# Patient Record
Sex: Female | Born: 1940
Health system: Southern US, Community
[De-identification: ages and names within clinical notes are randomized; demographics above are authoritative.]

## PROBLEM LIST (undated history)

## (undated) DIAGNOSIS — C4490 Unspecified malignant neoplasm of skin, unspecified: Secondary | ICD-10-CM

## (undated) DIAGNOSIS — M8580 Other specified disorders of bone density and structure, unspecified site: Secondary | ICD-10-CM

## (undated) DIAGNOSIS — E785 Hyperlipidemia, unspecified: Secondary | ICD-10-CM

## (undated) DIAGNOSIS — W540XXA Bitten by dog, initial encounter: Secondary | ICD-10-CM

## (undated) DIAGNOSIS — K219 Gastro-esophageal reflux disease without esophagitis: Secondary | ICD-10-CM

## (undated) DIAGNOSIS — J302 Other seasonal allergic rhinitis: Secondary | ICD-10-CM

## (undated) DIAGNOSIS — M1990 Unspecified osteoarthritis, unspecified site: Secondary | ICD-10-CM

## (undated) DIAGNOSIS — S71159A Open bite, unspecified thigh, initial encounter: Secondary | ICD-10-CM

## (undated) DIAGNOSIS — I10 Essential (primary) hypertension: Secondary | ICD-10-CM

## (undated) DIAGNOSIS — N8110 Cystocele, unspecified: Secondary | ICD-10-CM

## (undated) DIAGNOSIS — E039 Hypothyroidism, unspecified: Secondary | ICD-10-CM

## (undated) HISTORY — DX: Other specified disorders of bone density and structure, unspecified site: M85.80

## (undated) HISTORY — DX: Open bite, unspecified thigh, initial encounter: S71.159A

## (undated) HISTORY — DX: Hypothyroidism, unspecified: E03.9

## (undated) HISTORY — DX: Bitten by dog, initial encounter: W54.0XXA

## (undated) HISTORY — DX: Unspecified malignant neoplasm of skin, unspecified: C44.90

## (undated) HISTORY — DX: Cystocele, unspecified: N81.10

## (undated) HISTORY — DX: Other seasonal allergic rhinitis: J30.2

## (undated) HISTORY — DX: Unspecified osteoarthritis, unspecified site: M19.90

## (undated) HISTORY — DX: Gastro-esophageal reflux disease without esophagitis: K21.9

## (undated) HISTORY — PX: THYROIDECTOMY: SHX17

## (undated) HISTORY — DX: Hyperlipidemia, unspecified: E78.5

---

## 1997-10-24 ENCOUNTER — Other Ambulatory Visit: Admission: RE | Admit: 1997-10-24 | Discharge: 1997-10-24 | Payer: Self-pay | Admitting: *Deleted

## 1999-05-08 ENCOUNTER — Emergency Department (HOSPITAL_COMMUNITY): Admission: EM | Admit: 1999-05-08 | Discharge: 1999-05-08 | Payer: Self-pay | Admitting: Emergency Medicine

## 1999-05-08 ENCOUNTER — Encounter: Payer: Self-pay | Admitting: Emergency Medicine

## 1999-10-08 ENCOUNTER — Emergency Department (HOSPITAL_COMMUNITY): Admission: EM | Admit: 1999-10-08 | Discharge: 1999-10-09 | Payer: Self-pay | Admitting: *Deleted

## 1999-10-11 ENCOUNTER — Encounter: Payer: Self-pay | Admitting: Emergency Medicine

## 1999-10-11 ENCOUNTER — Emergency Department (HOSPITAL_COMMUNITY): Admission: EM | Admit: 1999-10-11 | Discharge: 1999-10-11 | Payer: Self-pay | Admitting: Emergency Medicine

## 1999-11-16 ENCOUNTER — Other Ambulatory Visit: Admission: RE | Admit: 1999-11-16 | Discharge: 1999-11-16 | Payer: Self-pay | Admitting: *Deleted

## 2000-06-23 ENCOUNTER — Emergency Department (HOSPITAL_COMMUNITY): Admission: EM | Admit: 2000-06-23 | Discharge: 2000-06-23 | Payer: Self-pay

## 2000-06-30 ENCOUNTER — Encounter: Admission: RE | Admit: 2000-06-30 | Discharge: 2000-09-28 | Payer: Self-pay | Admitting: Orthopedic Surgery

## 2001-05-18 ENCOUNTER — Other Ambulatory Visit: Admission: RE | Admit: 2001-05-18 | Discharge: 2001-05-18 | Payer: Self-pay | Admitting: *Deleted

## 2001-12-31 ENCOUNTER — Encounter: Payer: Self-pay | Admitting: Emergency Medicine

## 2001-12-31 ENCOUNTER — Emergency Department (HOSPITAL_COMMUNITY): Admission: EM | Admit: 2001-12-31 | Discharge: 2001-12-31 | Payer: Self-pay | Admitting: Emergency Medicine

## 2002-05-30 ENCOUNTER — Other Ambulatory Visit: Admission: RE | Admit: 2002-05-30 | Discharge: 2002-05-30 | Payer: Self-pay | Admitting: *Deleted

## 2003-02-25 ENCOUNTER — Encounter: Payer: Self-pay | Admitting: Internal Medicine

## 2003-06-23 ENCOUNTER — Other Ambulatory Visit: Admission: RE | Admit: 2003-06-23 | Discharge: 2003-06-23 | Payer: Self-pay | Admitting: *Deleted

## 2004-06-27 HISTORY — PX: COLONOSCOPY: SHX174

## 2004-06-27 LAB — HM COLONOSCOPY: HM Colonoscopy: NORMAL

## 2004-07-20 ENCOUNTER — Other Ambulatory Visit: Admission: RE | Admit: 2004-07-20 | Discharge: 2004-07-20 | Payer: Self-pay | Admitting: Obstetrics and Gynecology

## 2004-09-10 ENCOUNTER — Emergency Department (HOSPITAL_COMMUNITY): Admission: EM | Admit: 2004-09-10 | Discharge: 2004-09-10 | Payer: Self-pay | Admitting: Emergency Medicine

## 2005-09-12 ENCOUNTER — Other Ambulatory Visit: Admission: RE | Admit: 2005-09-12 | Discharge: 2005-09-12 | Payer: Self-pay | Admitting: Obstetrics and Gynecology

## 2006-09-27 ENCOUNTER — Ambulatory Visit: Payer: Self-pay | Admitting: Internal Medicine

## 2006-12-27 ENCOUNTER — Ambulatory Visit: Payer: Self-pay | Admitting: Internal Medicine

## 2006-12-27 LAB — CONVERTED CEMR LAB
ALT: 17 units/L (ref 0–35)
AST: 23 units/L (ref 0–37)
Albumin: 4.1 g/dL (ref 3.5–5.2)
Alkaline Phosphatase: 39 units/L (ref 39–117)
BUN: 10 mg/dL (ref 6–23)
Basophils Absolute: 0 10*3/uL (ref 0.0–0.1)
Basophils Relative: 0.3 % (ref 0.0–1.0)
Bilirubin, Direct: 0.2 mg/dL (ref 0.0–0.3)
CO2: 29 meq/L (ref 19–32)
Calcium: 9.4 mg/dL (ref 8.4–10.5)
Chloride: 107 meq/L (ref 96–112)
Cholesterol: 207 mg/dL (ref 0–200)
Creatinine, Ser: 0.8 mg/dL (ref 0.4–1.2)
Direct LDL: 134.1 mg/dL
Eosinophils Absolute: 0.2 10*3/uL (ref 0.0–0.6)
Eosinophils Relative: 4.8 % (ref 0.0–5.0)
GFR calc Af Amer: 93 mL/min
GFR calc non Af Amer: 77 mL/min
Glucose, Bld: 89 mg/dL (ref 70–99)
HCT: 41.2 % (ref 36.0–46.0)
HDL: 58.7 mg/dL (ref >39.0)
Hemoglobin: 14 g/dL (ref 12.0–15.0)
Lymphocytes Relative: 45.6 % (ref 12.0–46.0)
MCHC: 34 g/dL (ref 30.0–36.0)
MCV: 94.5 fL (ref 78.0–100.0)
Monocytes Absolute: 0.4 10*3/uL (ref 0.2–0.7)
Monocytes Relative: 9.3 % (ref 3.0–11.0)
Neutro Abs: 1.8 10*3/uL (ref 1.4–7.7)
Neutrophils Relative %: 40 % — ABNORMAL LOW (ref 43.0–77.0)
Platelets: 273 10*3/uL (ref 150–400)
Potassium: 3.6 meq/L (ref 3.5–5.1)
RBC: 4.36 M/uL (ref 3.87–5.11)
RDW: 12.2 % (ref 11.5–14.6)
Sodium: 142 meq/L (ref 135–145)
TSH: 1.97 microintl units/mL (ref 0.35–5.50)
Total Bilirubin: 2.2 mg/dL — ABNORMAL HIGH (ref 0.3–1.2)
Total CHOL/HDL Ratio: 3.5
Total Protein: 6.8 g/dL (ref 6.0–8.3)
Triglycerides: 68 mg/dL (ref 0–149)
VLDL: 14 mg/dL (ref 0–40)
Vit D, 1,25-Dihydroxy: 33 (ref 20–57)
WBC: 4.4 10*3/uL — ABNORMAL LOW (ref 4.5–10.5)

## 2007-01-25 DIAGNOSIS — E039 Hypothyroidism, unspecified: Secondary | ICD-10-CM

## 2007-01-25 DIAGNOSIS — M899 Disorder of bone, unspecified: Secondary | ICD-10-CM | POA: Insufficient documentation

## 2007-01-25 DIAGNOSIS — M949 Disorder of cartilage, unspecified: Secondary | ICD-10-CM

## 2007-01-25 DIAGNOSIS — M810 Age-related osteoporosis without current pathological fracture: Secondary | ICD-10-CM | POA: Insufficient documentation

## 2007-01-30 ENCOUNTER — Telehealth: Payer: Self-pay | Admitting: Internal Medicine

## 2007-03-06 ENCOUNTER — Ambulatory Visit: Payer: Self-pay | Admitting: Internal Medicine

## 2007-03-09 LAB — CONVERTED CEMR LAB: Vit D, 1,25-Dihydroxy: 34 (ref 20–57)

## 2007-03-28 ENCOUNTER — Ambulatory Visit: Payer: Self-pay | Admitting: Vascular Surgery

## 2007-05-09 ENCOUNTER — Ambulatory Visit: Payer: Self-pay | Admitting: Internal Medicine

## 2007-05-10 ENCOUNTER — Encounter: Payer: Self-pay | Admitting: Internal Medicine

## 2007-05-14 LAB — CONVERTED CEMR LAB: Vit D, 1,25-Dihydroxy: 37 (ref 30–89)

## 2007-06-22 ENCOUNTER — Encounter: Payer: Self-pay | Admitting: Internal Medicine

## 2007-06-25 ENCOUNTER — Telehealth: Payer: Self-pay | Admitting: Internal Medicine

## 2007-07-29 HISTORY — PX: ABDOMINAL HYSTERECTOMY: SHX81

## 2007-07-29 HISTORY — PX: OTHER SURGICAL HISTORY: SHX169

## 2007-07-31 ENCOUNTER — Ambulatory Visit (HOSPITAL_COMMUNITY): Admission: RE | Admit: 2007-07-31 | Discharge: 2007-08-01 | Payer: Self-pay | Admitting: Obstetrics & Gynecology

## 2007-07-31 ENCOUNTER — Encounter (INDEPENDENT_AMBULATORY_CARE_PROVIDER_SITE_OTHER): Payer: Self-pay | Admitting: Obstetrics & Gynecology

## 2007-10-09 ENCOUNTER — Emergency Department (HOSPITAL_COMMUNITY): Admission: EM | Admit: 2007-10-09 | Discharge: 2007-10-09 | Payer: Self-pay | Admitting: Family Medicine

## 2007-10-30 ENCOUNTER — Emergency Department (HOSPITAL_COMMUNITY): Admission: EM | Admit: 2007-10-30 | Discharge: 2007-10-30 | Payer: Self-pay | Admitting: Family Medicine

## 2008-03-31 ENCOUNTER — Ambulatory Visit: Payer: Self-pay | Admitting: Internal Medicine

## 2008-03-31 DIAGNOSIS — M79609 Pain in unspecified limb: Secondary | ICD-10-CM | POA: Insufficient documentation

## 2008-03-31 LAB — CONVERTED CEMR LAB
Bilirubin Urine: NEGATIVE
Glucose, Urine, Semiquant: NEGATIVE
Specific Gravity, Urine: 1.02
pH: 5.5

## 2008-04-07 LAB — CONVERTED CEMR LAB
ALT: 16 units/L (ref 0–35)
Albumin: 3.9 g/dL (ref 3.5–5.2)
BUN: 13 mg/dL (ref 6–23)
Basophils Absolute: 0 10*3/uL (ref 0.0–0.1)
Basophils Relative: 0.5 % (ref 0.0–3.0)
CO2: 30 meq/L (ref 19–32)
Calcium: 9.1 mg/dL (ref 8.4–10.5)
Cholesterol: 193 mg/dL (ref 0–200)
Creatinine, Ser: 0.8 mg/dL (ref 0.4–1.2)
Eosinophils Absolute: 0.2 10*3/uL (ref 0.0–0.7)
Eosinophils Relative: 6 % — ABNORMAL HIGH (ref 0.0–5.0)
Hemoglobin: 13.5 g/dL (ref 12.0–15.0)
MCHC: 34.7 g/dL (ref 30.0–36.0)
MCV: 95.1 fL (ref 78.0–100.0)
Neutro Abs: 1.6 10*3/uL (ref 1.4–7.7)
RBC: 4.08 M/uL (ref 3.87–5.11)
TSH: 2.1 microintl units/mL (ref 0.35–5.50)
Total Bilirubin: 1.3 mg/dL — ABNORMAL HIGH (ref 0.3–1.2)

## 2008-05-13 ENCOUNTER — Encounter: Payer: Self-pay | Admitting: Internal Medicine

## 2008-05-19 ENCOUNTER — Encounter: Payer: Self-pay | Admitting: Internal Medicine

## 2008-05-23 ENCOUNTER — Encounter: Payer: Self-pay | Admitting: Internal Medicine

## 2008-05-27 ENCOUNTER — Encounter: Payer: Self-pay | Admitting: Internal Medicine

## 2008-06-03 ENCOUNTER — Encounter: Payer: Self-pay | Admitting: Internal Medicine

## 2008-06-03 ENCOUNTER — Telehealth: Payer: Self-pay | Admitting: *Deleted

## 2008-06-05 ENCOUNTER — Encounter: Payer: Self-pay | Admitting: Internal Medicine

## 2008-06-09 ENCOUNTER — Telehealth: Payer: Self-pay | Admitting: *Deleted

## 2008-06-18 ENCOUNTER — Ambulatory Visit: Payer: Self-pay | Admitting: Internal Medicine

## 2008-06-25 ENCOUNTER — Telehealth: Payer: Self-pay | Admitting: Internal Medicine

## 2008-07-03 ENCOUNTER — Ambulatory Visit: Payer: Self-pay | Admitting: Internal Medicine

## 2008-07-03 LAB — CONVERTED CEMR LAB: Thyroglobulin Ab: <30 (ref 0.0–60.0)

## 2008-07-14 ENCOUNTER — Encounter: Payer: Self-pay | Admitting: Internal Medicine

## 2008-07-14 LAB — CONVERTED CEMR LAB: Free T4: 1.2 ng/dL (ref 0.6–1.6)

## 2008-12-11 ENCOUNTER — Telehealth: Payer: Self-pay | Admitting: *Deleted

## 2008-12-11 ENCOUNTER — Ambulatory Visit: Payer: Self-pay | Admitting: Internal Medicine

## 2009-01-15 ENCOUNTER — Ambulatory Visit: Payer: Self-pay | Admitting: Internal Medicine

## 2009-04-27 LAB — HM MAMMOGRAPHY: HM Mammogram: NORMAL

## 2009-06-04 ENCOUNTER — Encounter (INDEPENDENT_AMBULATORY_CARE_PROVIDER_SITE_OTHER): Payer: Self-pay | Admitting: *Deleted

## 2009-10-13 ENCOUNTER — Ambulatory Visit: Payer: Self-pay | Admitting: Internal Medicine

## 2009-10-13 DIAGNOSIS — R350 Frequency of micturition: Secondary | ICD-10-CM | POA: Insufficient documentation

## 2009-10-13 DIAGNOSIS — R12 Heartburn: Secondary | ICD-10-CM

## 2009-10-13 DIAGNOSIS — M545 Low back pain: Secondary | ICD-10-CM

## 2009-10-13 LAB — CONVERTED CEMR LAB
Bilirubin Urine: NEGATIVE
Blood in Urine, dipstick: NEGATIVE
Ketones, urine, test strip: NEGATIVE
Protein, U semiquant: NEGATIVE
Urobilinogen, UA: 0.2
pH: 5

## 2009-10-19 LAB — CONVERTED CEMR LAB
Albumin: 4.1 g/dL (ref 3.5–5.2)
Alkaline Phosphatase: 40 units/L (ref 39–117)
Basophils Absolute: 0 10*3/uL (ref 0.0–0.1)
Basophils Relative: 0.3 % (ref 0.0–3.0)
CO2: 27 meq/L (ref 19–32)
Chloride: 110 meq/L (ref 96–112)
Eosinophils Absolute: 0.2 10*3/uL (ref 0.0–0.7)
Glucose, Bld: 90 mg/dL (ref 70–99)
HCT: 38.4 % (ref 36.0–46.0)
HDL: 59.7 mg/dL (ref >39.00)
Hemoglobin: 13.3 g/dL (ref 12.0–15.0)
Lymphs Abs: 1.8 10*3/uL (ref 0.7–4.0)
MCHC: 34.7 g/dL (ref 30.0–36.0)
MCV: 94.5 fL (ref 78.0–100.0)
Neutro Abs: 2 10*3/uL (ref 1.4–7.7)
Potassium: 4.2 meq/L (ref 3.5–5.1)
RBC: 4.06 M/uL (ref 3.87–5.11)
RDW: 13.1 % (ref 11.5–14.6)
Sodium: 144 meq/L (ref 135–145)
TSH: 0.38 microintl units/mL (ref 0.35–5.50)
Total CHOL/HDL Ratio: 3
Total Protein: 7 g/dL (ref 6.0–8.3)
Triglycerides: 61 mg/dL (ref 0.0–149.0)

## 2010-01-28 ENCOUNTER — Emergency Department (HOSPITAL_COMMUNITY)
Admission: EM | Admit: 2010-01-28 | Discharge: 2010-01-29 | Payer: Self-pay | Source: Home / Self Care | Admitting: Emergency Medicine

## 2010-01-31 ENCOUNTER — Emergency Department (HOSPITAL_COMMUNITY): Admission: EM | Admit: 2010-01-31 | Discharge: 2010-01-31 | Payer: Self-pay | Admitting: Family Medicine

## 2010-02-02 ENCOUNTER — Encounter: Payer: Self-pay | Admitting: Internal Medicine

## 2010-02-04 ENCOUNTER — Emergency Department (HOSPITAL_COMMUNITY): Admission: EM | Admit: 2010-02-04 | Discharge: 2010-02-04 | Payer: Self-pay | Admitting: Family Medicine

## 2010-02-10 ENCOUNTER — Ambulatory Visit: Payer: Self-pay | Admitting: Internal Medicine

## 2010-02-10 DIAGNOSIS — J069 Acute upper respiratory infection, unspecified: Secondary | ICD-10-CM | POA: Insufficient documentation

## 2010-02-11 ENCOUNTER — Emergency Department (HOSPITAL_COMMUNITY): Admission: EM | Admit: 2010-02-11 | Discharge: 2010-02-11 | Payer: Self-pay | Admitting: Emergency Medicine

## 2010-03-19 ENCOUNTER — Ambulatory Visit: Payer: Self-pay | Admitting: Internal Medicine

## 2010-03-31 ENCOUNTER — Ambulatory Visit: Payer: Self-pay | Admitting: Internal Medicine

## 2010-03-31 DIAGNOSIS — R002 Palpitations: Secondary | ICD-10-CM | POA: Insufficient documentation

## 2010-04-05 LAB — CONVERTED CEMR LAB
Basophils Relative: 0.2 % (ref 0.0–3.0)
HCT: 38.1 % (ref 36.0–46.0)
Hemoglobin: 13.2 g/dL (ref 12.0–15.0)
Lymphocytes Relative: 38.5 % (ref 12.0–46.0)
Lymphs Abs: 2.2 10*3/uL (ref 0.7–4.0)
MCHC: 34.6 g/dL (ref 30.0–36.0)
Monocytes Relative: 8.3 % (ref 3.0–12.0)
Neutro Abs: 2.7 10*3/uL (ref 1.4–7.7)
RBC: 3.97 M/uL (ref 3.87–5.11)
RDW: 13.7 % (ref 11.5–14.6)
TSH: 0.5 microintl units/mL (ref 0.35–5.50)

## 2010-04-16 ENCOUNTER — Encounter: Payer: Self-pay | Admitting: Internal Medicine

## 2010-05-26 ENCOUNTER — Encounter: Payer: Self-pay | Admitting: Internal Medicine

## 2010-07-02 ENCOUNTER — Encounter: Payer: Self-pay | Admitting: Internal Medicine

## 2010-07-29 ENCOUNTER — Encounter: Payer: Self-pay | Admitting: Cardiovascular Disease

## 2010-07-29 ENCOUNTER — Ambulatory Visit (INDEPENDENT_AMBULATORY_CARE_PROVIDER_SITE_OTHER): Payer: Self-pay

## 2010-07-29 ENCOUNTER — Encounter (INDEPENDENT_AMBULATORY_CARE_PROVIDER_SITE_OTHER): Payer: Self-pay | Admitting: *Deleted

## 2010-07-29 DIAGNOSIS — Z0000 Encounter for general adult medical examination without abnormal findings: Secondary | ICD-10-CM

## 2010-07-29 NOTE — Letter (Signed)
Summary: Rheumatology-Dr. Stacey Drain  Rheumatology-Dr. Stacey Drain   Imported By: Maryln Gottron 04/26/2010 10:53:25  _____________________________________________________________________  External Attachment:    Type:   Image     Comment:   External Document

## 2010-07-29 NOTE — Assessment & Plan Note (Signed)
Summary: emp/pt fasting/cjr pt rsc due to weather/njr   Vital Signs:  Patient profile:   70 year old female Menstrual status:  hysterectomy Height:      61 inches Weight:      156 pounds BMI:     29.58 Pulse rate:   60 / minute BP sitting:   120 / 80  (right arm) Cuff size:   regular  Vitals Entered By: Romualdo Bolk, CMA (AAMA) (October 13, 2009 9:32 AM) CC: Annual visit for disease management- Pt is fasting for labs     Menstrual Status hysterectomy   History of Present Illness: [name] [name] comesin comes in today  for  medical management   Also Here for Medicare AWV:   1.   Risk factors based on Past M, S, F history:  bone health   at risk    no family hx of fracture 2.   Physical Activities:   less recently caring for husband  who had serious mva last year  No limitations except time  3.   Depression/mood:  No major concerns  jsut busy . No dementia signs  4.   Hearing:  no concerns    no eye disease  had hearing checked  5.   ADL's:  does hers works and more  6.   Fall Risk:  no falls  disc prevention 7.   Home Safety:   Yes .   8.   Height, weight, &visual acuity: 9.   Counseling:  yes   nnutrion accident avoidance  . etc.  10.   Labs ordered based on risk factors:    thyroid cbc lipids and liver  bmp  11.           Referral Coordination  no referrals needed  also has gyne  12.           Care Plan  get zostavax when able   thyroid : no change in meds and doing well  Osteoprosis :   dexa     per gyne ?  on vit d  and calcium     Preventive Care Screening  Mammogram:    Date:  04/27/2009    Results:  normal   Colonoscopy:    Date:  06/27/2004    Results:  normal   Prior Values:    Last Tetanus Booster:  Td (12/27/2006)    Last Flu Shot:  Fluvax 3+ (03/31/2008)    Last Pneumovax:  Pneumovax (Medicare) (01/15/2009)   Preventive Screening-Counseling & Management  Alcohol-Tobacco     Alcohol drinks/day: <1     Alcohol type: wine     Smoking Status:  never  Caffeine-Diet-Exercise     Caffeine use/day: 0     Does Patient Exercise: yes  Comments: denies depression   Hep-HIV-STD-Contraception     Dental Visit-last 6 months yes     Sun Exposure-Excessive: no  Safety-Violence-Falls     Seat Belt Use: yes     Firearms in the Home: firearms in the home     Firearm Counseling: to practice firearm safety     Smoke Detectors: yes     Violence in the Home: no risk noted     Fall Risk: no      Fall Risk Counseling: not indicated; no significant falls noted  Comments: ocass     tanning bed       Blood Transfusions:  no.    EKG  Procedure date:  12/27/2006  Findings:  Sinus bradycardia with rate of:  58  Current Medications (verified): 1)  Actonel 150 Mg Tabs (Risedronate Sodium) .Marland Kitchen.. 1 Monthly 2)  Caltrate 600 1500 Mg Tabs (Calcium Carbonate) 3)  Combipatch 0.05-0.14 Mg/day Pttw (Estradiol-Norethindrone Acet) .... Apply 1 Patch To Skin Twice A Week 4)  Longs B Complex - C  Tabs (B Complex-C) .... Take 5)  Synthroid 100 Mcg Tabs (Levothyroxine Sodium) .... Take 1 Tablet By Mouth Once A Day 6)  Vitamin D 1000 Unit  Tabs (Cholecalciferol)  Allergies (verified): No Known Drug Allergies  Past History:  Past medical, surgical, family and social histories (including risk factors) reviewed, and no changes noted (except as noted below).  Past Medical History: G1P1 Hypothyroidism post thyroidectomy  Osteopenia/ Osteoporosis T  -2.5 hip in 2005 by dexa  Seasonal Rhinitis Osteoporosis Skin cancer   squamous cell ca chest .  Elevated lipids Vaginal prolapse hx corrected  CONSULTANTS  Ezzie Dural  derm         Past Surgical History: Reviewed history from 06/18/2008 and no changes required. Thyroidectomy Colonoscopy-2006 Hysterectomy 07/2007 Bladders sling surgery 07/2007     Past History:  Care Management: Gastroenterology: Deboraha Sprang GI Gynecology: Randa Lynn  Dermatology: Michaell Cowing  Family History: Reviewed  history from 03/31/2008 and no changes required. Family History of Alcoholism/Addiction Family History Kidney disease Family History Other cancer-Kidney Family History of Cardiovascular disorder Mom died MI   in her 80s . heavy  alcohol Brother  died  of enlarged heart.   in his 32s      no fam hx of hip fracture   Social History: Reviewed history from 06/18/2008 and no changes required. 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      Caffeine use/day:  0 Seat Belt Use:  yes Dental Care w/in 6 mos.:  yes Sun Exposure-Excessive:  no Fall Risk:  no  Blood Transfusions:  no  Review of Systems  The patient denies anorexia, fever, weight loss, weight gain, vision loss, hoarseness, chest pain, syncope, dyspnea on exertion, peripheral edema, prolonged cough, headaches, hemoptysis, abdominal pain, melena, hematochezia, severe indigestion/heartburn, muscle weakness, transient blindness, difficulty walking, depression, unusual weight change, abnormal bleeding, enlarged lymph nodes, angioedema, and breast masses.         indigestion esp in am   does eat late at night .     lots of green tee and coffe  no exercise induced symptom or pulm symptom with this.  Indigestion ocass usallin in am  eats late  ocass otc med no dysphagia cp sob or exercise related problem .   Physical Exam General Appearance: well developed, well nourished, no acute distress Eyes: conjunctiva and lids normal, PERRLA, EOMI, WNL  glasses  Ears, Nose, Mouth, Throat: TM clear, nares clear, oral exam WNL Neck: supple, no lymphadenopathy, no thyromegaly, no JVD Respiratory: clear to auscultation and percussion, respiratory effort normal Cardiovascular: regular rate and rhythm, S1-S2, no murmur, rub or gallop, no bruits, peripheral pulses normal and symmetric, no cyanosis, clubbing, edema or  varicosities Chest: no scars, masses, tenderness; no asymmetry, skin changes, nipple discharge    some kyphosis  Gastrointestinal: soft, non-tender; no hepatosplenomegaly, masses; active bowel sounds all quadrants,  Genitourinary: per gyne Lymphatic: no cervical, axillary or inguinal adenopathy Musculoskeletal: gait normal, muscle tone and strength WNL,  no joint swelling, effusions, discoloration, crepitus  Skin: clear, good turgor, color WNL, no rashes, lesions, or ulcerations Neurologic: normal mental status, normal reflexes, normal strength, sensation, and motion Psychiatric: alert; oriented to person, place and time Other Exam:     Impression & Recommendations:  Problem # 1:  PREVENTIVE HEALTH CARE (ICD-V70.0)  Wellness factorsadressed .  See  HPI    Discussed nutrition,exercise,diet,healthy weight, vitamin D and calcium.  injury prevention.  Orders: First annual wellness visit with prevention plan  (E4540) TLB-BMP (Basic Metabolic Panel-BMET) (80048-METABOL) TLB-CBC Platelet - w/Differential (85025-CBCD) TLB-Hepatic/Liver Function Pnl (80076-HEPATIC) TLB-Lipid Panel (80061-LIPID)  Problem # 2:  HYPOTHYROIDISM (ICD-244.9)  Her updated medication list for this problem includes:    Synthroid 100 Mcg Tabs (Levothyroxine sodium) .Marland Kitchen... Take 1 tablet by mouth once a day  Orders: TLB-TSH (Thyroid Stimulating Hormone) [phone]) Prescription Created Electronically [phone])  Problem # 3:  OSTEOPOROSIS (ICD-733.00) on meds and most recent exa reportedly  osteopenia  but orig dx by dexa in stoeporosi category ..  thyroid disease is a  risk also but no sig fam hx  Her updated medication list for this problem includes:    Actonel 150 Mg Tabs (Risedronate sodium) .Marland Kitchen... 1 monthly    Caltrate 600 1500 Mg Tabs (Calcium carbonate)    Vitamin D 1000 Unit Tabs (Cholecalciferol)  Problem # 4:  HEARTBURN (ICD-787.1) Assessment: New disc      avoid eating late at night  no alarm signs except  age  .. disc   and canuse otc zantaac or pepcid . if using meds frequently of persistent or  progressive  then needs ov to discuss an poss further eval.   care with using  actonel as this could  cause  Upper gi inflammation.   Problem # 5:  FREQUENCY, URINARY (ICD-788.41) nl  ua  could be from  increase use of green tea.  Orders: TLB-BMP (Basic Metabolic Panel-BMET) (80048-METABOL) TLB-CBC Platelet - w/Differential (85025-CBCD) TLB-Hepatic/Liver Function Pnl (80076-HEPATIC)  Complete Medication List: 1)  Actonel 150 Mg Tabs (Risedronate sodium) .Marland Kitchen.. 1 monthly 2)  Caltrate 600 1500 Mg Tabs (Calcium carbonate) 3)  Combipatch 0.05-0.14 Mg/day Pttw (Estradiol-norethindrone acet) .... Apply 1 patch to skin twice a week 4)  Longs B Complex - C Tabs (B complex-c) .... Take 5)  Synthroid 100 Mcg Tabs (Levothyroxine sodium) .... Take 1 tablet by mouth once a day 6)  Vitamin D 1000 Unit Tabs (Cholecalciferol)  Other Orders: UA Dipstick w/o Micro (automated)  (81003)  Patient Instructions: 1)  You will be informed of lab results when available.  2)  call for zostavax when needed.  Prescriptions: SYNTHROID 100 MCG TABS (LEVOTHYROXINE SODIUM) Take 1 tablet by mouth once a day  #30 Tablet x 11   Entered by:   Romualdo Bolk, CMA (AAMA)   Authorized by:   Madelin Headings MD   Signed by:   Romualdo Bolk, CMA (AAMA) on 10/13/2009   Method used:   Electronically to        [facility] [address] [phone]* (retail)       [address]       [address]       [place], [place]  [address]       Ph: [phone]       Fax: [phone]   RxID:   [id number]   Laboratory Results   Urine Tests    Routine Urinalysis   Color: yellow Appearance: Clear Glucose: negative   (Normal  Range: Negative) Bilirubin: negative   (Normal Range: Negative) Ketone: negative   (Normal Range: Negative) Spec. Gravity: >=1.030   (Normal Range: 1.003-1.035) Blood: negative   (Normal Range: Negative) pH:  5.0   (Normal Range: 5.0-8.0) Protein: negative   (Normal Range: Negative) Urobilinogen: 0.2   (Normal Range: 0-1) Nitrite: negative   (Normal Range: Negative) Leukocyte Esterace: negative   (Normal Range: Negative)        Appended Document: emp/pt fasting/cjr pt rsc due to weather/njr Neuro cognitive status evaluated: Pt was  A&Ox3,affect,speech,memory,attention,&motor skills appeared normally  intact.

## 2010-07-29 NOTE — Assessment & Plan Note (Signed)
Summary: FLU SHOT//ALP  Nurse Visit   Allergies: No Known Drug Allergies  Appended Document: FLU SHOT//ALP    Nurse Visit   Vital Signs:  Patient profile:   70 year old female Menstrual status:  hysterectomy Temp:     98.5 degrees F oral  Vitals Entered By: Duard Brady LPN (March 19, 2010 12:04 PM)  Allergies: No Known Drug Allergies  Immunizations Administered:  Influenza Vaccine # 1:    Vaccine Type: Fluvax MCR    Site: left deltoid    Mfr: GlaxoSmithKline    Dose: 0.5 ml    Route: IM    Given by: Duard Brady LPN    Exp. Date: 12/25/2010    Lot #: [id number]    VIS given: 01/19/10 version given March 19, 2010.    Physician counseled: yes  Flu Vaccine Consent Questions:    Do you have a history of severe allergic reactions to this vaccine? no    Any prior history of allergic reactions to egg and/or gelatin? no    Do you have a sensitivity to the preservative Thimersol? no    Do you have a past history of Guillan-Barre Syndrome? no    Do you currently have an acute febrile illness? no    Have you ever had a severe reaction to latex? no    Vaccine information given and explained to patient? yes    Are you currently pregnant? no  Orders Added: 1)  Influenza Vaccine MCR [00025]

## 2010-07-29 NOTE — Assessment & Plan Note (Signed)
Summary: SINUSITIS? // RS   Vital Signs:  Patient profile:   70 year old female Menstrual status:  hysterectomy Weight:      157 pounds O2 Sat:      97 % on Room air Temp:     98.4 degrees F oral Pulse rate:   87 / minute BP sitting:   110 / 80  (left arm) Cuff size:   regular  Vitals Entered By: Romualdo Bolk, CMA (AAMA) (February 10, 2010 3:01 PM)  O2 Flow:  Room air CC: Sinus pressure, coughing and congestion. Pt took Augmentin on 8/4 x 5 days. This started on 8/16. Pt is also getting rabies vaccines at [facility]. Pt is taking Ampicillin.   History of Present Illness: [name] [name] comes in today for acute evaluation for  ur .sx  Most recently has  had to have rabies vaccination  for cat  stray cat exposures  . on last  rabies  shot .  Augmentin  for 5 days august 4th  felt ok then got sore throat and congestion began 3 days ago  and then had low grade 99.1   and took asa.  without  feverSome body aches .   imporved today   Took husbands  ampicillin.    x 1     coughing up white  phelgm.   white .    hx of  seasonal rhnitis but not now.    Preventive Screening-Counseling & Management  Alcohol-Tobacco     Alcohol drinks/day: <1     Alcohol type: wine     Smoking Status: never  Caffeine-Diet-Exercise     Caffeine use/day: 0     Does Patient Exercise: yes  Current Medications (verified): 1)  Actonel 150 Mg Tabs (Risedronate Sodium) .Marland Kitchen.. 1 Monthly 2)  Caltrate 600 1500 Mg Tabs (Calcium Carbonate) 3)  Combipatch 0.05-0.14 Mg/day Pttw (Estradiol-Norethindrone Acet) .... Apply 1 Patch To Skin Twice A Week 4)  Longs B Complex - C  Tabs (B Complex-C) .... Take 5)  Synthroid 100 Mcg Tabs (Levothyroxine Sodium) .... Take 1 Tablet By Mouth Once A Day 6)  Vitamin D 1000 Unit  Tabs (Cholecalciferol) 7)  Rabavert  Susr (Rabies Vaccine, Pcec)  Allergies (verified): No Known Drug Allergies  Past History:  Past medical, surgical, family and social histories  (including risk factors) reviewed for relevance to current acute and chronic problems.  Past Medical History: G1P1 Hypothyroidism post thyroidectomy  Osteopenia/ Osteoporosis T  -2.5 hip in 2005 by dexa  Seasonal Rhinitis  spring fall Osteoporosis Skin cancer   squamous cell ca chest .  Elevated lipids Vaginal prolapse hx corrected  CONSULTANTS  Ezzie Dural  derm         Past Surgical History: Reviewed history from 06/18/2008 and no changes required. Thyroidectomy Colonoscopy-2006 Hysterectomy 07/2007 Bladders sling surgery 07/2007     Family History: Reviewed history from 10/13/2009 and no changes required. Family History of Alcoholism/Addiction Family History Kidney disease Family History Other cancer-Kidney Family History of Cardiovascular disorder Mom died MI   in her 60s . heavy  alcohol Brother  died  of enlarged heart.   in his 41s      no fam hx of hip fracture   Social History: Reviewed history from 10/13/2009 and no changes required. 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        Review of Systems  The patient denies anorexia, weight loss, chest pain, dyspnea on exertion, peripheral edema, prolonged cough, abnormal bleeding, and enlarged lymph nodes.    Physical Exam  General:  alert, well-developed, and well-nourished.   Head:  normocephalic and atraumatic.   Eyes:  vision grossly intact.   Ears:  R ear normal and L ear normal.   Nose:  no external deformity and no external erythema.  mucoid to lear dc left fore than right  no tenderness Mouth:  pharynx pink and moist.   Neck:  No deformities, masses, or tenderness noted. Lungs:  Normal respiratory effort, chest expands symmetrically. Lungs are clear to auscultation, no crackles or wheezes.no dullness.   Heart:  Normal rate and regular rhythm. S1 and S2 normal without  gallop, murmur, click, rub or other extra sounds.   Impression & Recommendations:  Problem # 1:  URI (ICD-465.9) seems viral   but hx of sinusitis rx    Expectant management and  call if persistent or  progressive  for other intervention as needed   Problem # 2:  Hx of CAT BITE AUGUST  RABIES PROPHYL (ICD-E906.3)  Complete Medication List: 1)  Actonel 150 Mg Tabs (Risedronate sodium) .Marland Kitchen.. 1 monthly 2)  Caltrate 600 1500 Mg Tabs (Calcium carbonate) 3)  Combipatch 0.05-0.14 Mg/day Pttw (Estradiol-norethindrone acet) .... Apply 1 patch to skin twice a week 4)  Longs B Complex - C Tabs (B complex-c) .... Take 5)  Synthroid 100 Mcg Tabs (Levothyroxine sodium) .... Take 1 tablet by mouth once a day 6)  Vitamin D 1000 Unit Tabs (Cholecalciferol) 7)  Rabavert Susr (Rabies vaccine, pcec)  Patient Instructions: 1)  Acute sinusitis symptoms for less than 10 days are not helped by antibiotics. Use warm moist compresses, and over the counter decongestants( only as directed). Call if no improvement in 5-7 days, sooner if increasing pain, feve over 100.3 , or new symptoms.  2)  this acts like a viral infection and should last about 10 -14 days .

## 2010-07-29 NOTE — Assessment & Plan Note (Signed)
Summary: CHECK THYROID//SLM   Vital Signs:  Patient profile:   70 year old female Menstrual status:  hysterectomy Weight:      160 pounds Pulse rate:   78 / minute BP sitting:   100 / 70  (right arm) Cuff size:   regular  Vitals Entered By: Romualdo Bolk, CMA (AAMA) (March 31, 2010 12:35 PM) CC: Pt wants her tsh checked. She is on generic synthroid. Pt states that she can hear her heart race and her heads gets wet on occ.   History of Present Illness: [name] [name] comes in today  for changes  as above . Feels like she did a bit  before her thyroid surgery with hearing heart beating but no racing usually in the pm No cp sob .   no change in activity or exercise   intolerance . jittery and damp at base of neck but no sweats  nausea or vomiting .  ? some anxiety .  She says she feels Almost like before had thyroid surgery.     Has had  this for about  3 weeks. no change in meds but has been on   generic for months   ( yellow pill  )seems to be the same . No chest pain no change in indigestion and no sob.  NO fever . NO night  minimal alcohol    " more green tea" recently   . no supplements otherwise .   Asks about shingles vaccine. To get today.   Preventive Screening-Counseling & Management  Alcohol-Tobacco     Alcohol drinks/day: <1     Alcohol type: wine     Smoking Status: never  Caffeine-Diet-Exercise     Caffeine use/day: 0     Does Patient Exercise: yes  Current Medications (verified): 1)  Actonel 150 Mg Tabs (Risedronate Sodium) .Marland Kitchen.. 1 Monthly 2)  Caltrate 600 1500 Mg Tabs (Calcium Carbonate) 3)  Combipatch 0.05-0.14 Mg/day Pttw (Estradiol-Norethindrone Acet) .... Apply 1 Patch To Skin Twice A Week 4)  Longs B Complex - C  Tabs (B Complex-C) .... Take 5)  Synthroid 100 Mcg Tabs (Levothyroxine Sodium) .... Take 1 Tablet By Mouth Once A Day 6)  Vitamin D 1000 Unit  Tabs (Cholecalciferol)  Allergies (verified): No Known Drug Allergies  Past History:  Past  medical, surgical, family and social histories (including risk factors) reviewed, and no changes noted (except as noted below).  Past Medical History: Reviewed history from 02/10/2010 and no changes required. G1P1 Hypothyroidism post thyroidectomy  Osteopenia/ Osteoporosis T  -2.5 hip in 2005 by dexa  Seasonal Rhinitis  spring fall Osteoporosis Skin cancer   squamous cell ca chest .  Elevated lipids Vaginal prolapse hx corrected  CONSULTANTS  Ezzie Dural  derm         Past Surgical History: Reviewed history from 06/18/2008 and no changes required. Thyroidectomy Colonoscopy-2006 Hysterectomy 07/2007 Bladders sling surgery 07/2007     Past History:  Care Management: Gastroenterology: Deboraha Sprang GI Gynecology: Randa Lynn  Dermatology: Michaell Cowing  Family History: Reviewed history from 10/13/2009 and no changes required. Family History of Alcoholism/Addiction Family History Kidney disease Family History Other cancer-Kidney Family History of Cardiovascular disorder Mom died MI   in her 61s . heavy  alcohol Brother  died  of enlarged heart.   in his 64s      no fam hx of hip fracture   Social History: Reviewed history from 10/13/2009 and no changes required. 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        Review of Systems  The patient denies anorexia, fever, weight loss, weight gain, vision loss, decreased hearing, chest pain, syncope, dyspnea on exertion, peripheral edema, prolonged cough, transient blindness, difficulty walking, depression, abnormal bleeding, enlarged lymph nodes, and angioedema.    Physical Exam  General:  Well-developed,well-nourished,in no acute distress; alert,appropriate and cooperative throughout examination Head:  normocephalic and atraumatic.   Eyes:  vision grossly intact.   Ears:  R ear normal and L ear normal.    Nose:  no external deformity, no external erythema, and no nasal discharge.   Neck:  No deformities, masses, or tenderness noted. Lungs:  Normal respiratory effort, chest expands symmetrically. Lungs are clear to auscultation, no crackles or wheezes. Heart:  Normal rate and regular rhythm. S1 and S2 normal without gallop, murmur, click, rub or other extra sounds.no lifts.   Abdomen:  Bowel sounds positive,abdomen soft and non-tender without masses, organomegaly or   noted. Pulses:  pulses intact without delay  not bounding  Extremities:  no clubbing cyanosis or edema  Neurologic:  alert & oriented X3.  grossly non focal  Skin:  turgor normal, color normal, no ecchymoses, and no petechiae.   Cervical Nodes:  No lymphadenopathy noted Psych:  Oriented X3, good eye contact, not anxious appearing, and not depressed appearing.     Impression & Recommendations:  Problem # 1:  PALPITATIONS (ICD-785.1) somewhat vague signs like thyperthyroid but nl exam and  no alarm features.  Orders: TLB-TSH (Thyroid Stimulating Hormone) (84443-TSH) TLB-CBC Platelet - w/Differential (85025-CBCD) T-T4, Thyroxine; Total [phone]) T-T3 by RIA ([id number]) Venipuncture (28413) Specimen Handling (24401)  Problem # 2:  PREVENTIVE HEALTH CARE (ICD-V70.0) agree with      shingles shot today.   Problem # 3:  HYPOTHYROIDISM (ICD-244.9)  replacement with change from brand to gneric over 3   months  then recheck.  Her updated medication list for this problem includes:    Synthroid 100 Mcg Tabs (Levothyroxine sodium) .Marland Kitchen... Take 1 tablet by mouth once a day  Orders: Venipuncture (02725) Specimen Handling (36644)  Labs Reviewed: TSH: 0.38 (10/13/2009)    Chol: 175 (10/13/2009)   HDL: 59.70 (10/13/2009)   LDL: 103 (10/13/2009)   TG: 61.0 (10/13/2009)  Complete Medication List: 1)  Actonel 150 Mg Tabs (Risedronate sodium) .Marland Kitchen.. 1 monthly 2)  Caltrate 600 1500 Mg Tabs (Calcium carbonate) 3)  Combipatch  0.05-0.14 Mg/day Pttw (Estradiol-norethindrone acet) .... Apply 1 patch to skin twice a week 4)  Longs B Complex - C Tabs (B complex-c) .... Take 5)  Synthroid 100 Mcg Tabs (Levothyroxine sodium) .... Take 1 tablet by mouth once a day 6)  Vitamin D 1000 Unit Tabs (Cholecalciferol)  Other Orders: Zoster (Shingles) Vaccine Live (03474) Admin 1st Vaccine (25956)  Patient Instructions: 1)  limit caffiene  2)  You will be informed of lab results when available.  3)  then plan follow up  4)  if normal  labs and continuing   after  above  return office visit  and poss further eval.    Immunizations Administered:  Zostavax # 1:    Vaccine Type: Zostavax    Site: right deltoid    Mfr: Merck    Dose: 0.5 ml    Route: Aurelia    Given  by: Romualdo Bolk, CMA (AAMA)    Exp. Date: 02/12/2011    Lot #: [id number]

## 2010-07-29 NOTE — Letter (Signed)
Summary: Rheumatology-Dr. Stacey Drain  Rheumatology-Dr. Stacey Drain   Imported By: Maryln Gottron 06/03/2010 09:33:03  _____________________________________________________________________  External Attachment:    Type:   Image     Comment:   External Document

## 2010-07-29 NOTE — Letter (Signed)
Summary: [facility]   Imported By: Maryln Gottron 02/17/2010 09:50:44  _____________________________________________________________________  External Attachment:    Type:   Image     Comment:   External Document

## 2010-08-04 NOTE — Miscellaneous (Signed)
Summary: Orders Update  Clinical Lists Changes  Orders: Added new Test order of Vascular Screening  (Vas. screening) - Signed 

## 2010-09-06 ENCOUNTER — Other Ambulatory Visit (HOSPITAL_COMMUNITY): Payer: Self-pay | Admitting: Obstetrics & Gynecology

## 2010-09-06 DIAGNOSIS — M8580 Other specified disorders of bone density and structure, unspecified site: Secondary | ICD-10-CM

## 2010-09-14 ENCOUNTER — Ambulatory Visit (HOSPITAL_COMMUNITY)
Admission: RE | Admit: 2010-09-14 | Discharge: 2010-09-14 | Disposition: A | Discharge status: Home / Self Care | Payer: Medicare Other | Source: Ambulatory Visit | Attending: Obstetrics & Gynecology | Admitting: Obstetrics & Gynecology

## 2010-09-14 DIAGNOSIS — N951 Menopausal and female climacteric states: Secondary | ICD-10-CM | POA: Insufficient documentation

## 2010-09-14 DIAGNOSIS — M810 Age-related osteoporosis without current pathological fracture: Secondary | ICD-10-CM | POA: Insufficient documentation

## 2010-09-14 DIAGNOSIS — M8580 Other specified disorders of bone density and structure, unspecified site: Secondary | ICD-10-CM

## 2010-09-21 ENCOUNTER — Other Ambulatory Visit: Payer: Self-pay | Admitting: Internal Medicine

## 2010-10-22 ENCOUNTER — Other Ambulatory Visit: Payer: Self-pay | Admitting: Internal Medicine

## 2010-11-09 NOTE — Consult Note (Signed)
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  She  is a very healthy 70 year old white female with no history of deep  venous thrombosis or bleeding from her varicosities.  She has no pain  associated with these.  She is concerned regarding the size of these and  is seeing me today to discuss medical implication of these and also  possible treatment options.  She denies any swelling or edema the end of  the day.   PAST MEDICAL HISTORY:  Her medical history is negative for hypertension,  cancer, diabetes.  She did have skin cancer.  She does not smoke  cigarettes and does not have any major medical difficulties.   PHYSICAL EXAMINATION:  On physical exam she does have reticular  varicosities in the posterior aspect of her left calf and several other  small splatter veins and small reticular veins on her right medial calf  as well.  She underwent hand held duplex by me and interestingly, this  does show significant reflux in her left great saphenous vein throughout  its course and this does extend into and give off branches into the area  of reticular varicosities in her left calf.   ASSESSMENT/PLAN:  I discussed this at length with [name] [name].  I  explained that this is related to venous hypertension.  She is having no  pain associated with this.  I feel it is appropriate to attempt sclera  therapy of these veins.  I explained that she may have a recurrence and  if this continues to be a concern she may require more aggressive  treatment with laser ablation of her saphenous vein.  I explained that  there are certainly venous practitioners who recommend laser ablation  of  her saphenous vein at this juncture but I feel that I would not be quite  this aggressive since she is concerned regarding only the appearance of  this.  I explained we could inject this with sodium tetradecyl and  should be able to get rid of these on the short term with potential open  for repeat sclera therapy or more aggressive treatment with laser  ablation of her saphenous vein in the future should she have early  recurrence.  She understands this and wishes to proceed with sclera  therapy which we will schedule through our office.   Larina Earthly, M.D.  Electronically Signed   TFE/MEDQ  D:  03/28/2007  T:  03/29/2007  Job:  [id number]   cc:   Etter Sjogren, M.D.

## 2010-11-09 NOTE — Op Note (Signed)
[name] [name], [name] [name]              ACCOUNT NO.:  [id number]   MEDICAL RECORD NO.:  [id number]          PATIENT TYPE:  AMB   LOCATION:  DAY                          FACILITY:  [facility]   PHYSICIAN:  Martina Sinner, MD DATE OF BIRTH:  [birth year]   DATE OF PROCEDURE:  07/31/2007  DATE OF DISCHARGE:                               OPERATIVE REPORT   PREOPERATIVE DIAGNOSIS:  Stress incontinence.   POSTOPERATIVE DIAGNOSIS:  Stress incontinence.   SURGERY:  Sling, cystourethropexy [facility]) plus cystoscopy.   SURGEON:  Martina Sinner, M.D.   [name] underwent a robotic assisted transabdominal hysterectomy  and sacrocolpopexy.  I was asked to perform a sling following this.  The  procedure went very well by Dr. Seymour Bars.   The vaginal length was excellent.  It was easy to access the proximal  urethra.  A 2 cm incision was made deeply to make a thick flap after  instilling 8 mL of lidocaine epinephrine mixture.  I made two 1 cm  incisions 1.5 fingerbreadths lateral to the midline one fingerbreadth  above the symphysis pubis.  With the bladder empty, I passed the [facility]  needle on top of and along the back of the symphysis pubis parallel to  the midline onto the pulp of my index finger bilaterally.  I cystoscoped  the patient and there was a little bit of indentation on the patient's  left side so I replaced the needle.  I refilled the patient and there  was no indentation of the bladder with needle deflection.  The was no  injury to the bladder or urethra.  There is efflux of indigo carmine  bilaterally.   With the bladder empty, I attached a SPARC sling and brought it up to  the retropubic space.  I tensioned it over the fat part of a mid sized  Kelly clamp.  I cut below the blue dots, irrigated the sheath, and  removed the sheath.  I was very happy with the position and tension of  the sling.  There is hypermobility of the sling in the midline.  There  was no spring back effect.   After irrigation, I closed the anterior  vaginal wall with running 2-0 Vicryl on CT2 needle.  Two interrupted  sutures were also utilized.  I cut the sling below the skin and closed  the skin with Dermabond and 4-0 Vicryl.  I was very pleased with the  operation.  Hopefully, we will reach her treatment goal.           ______________________________  Martina Sinner, MD  Electronically Signed     SAM/MEDQ  D:  07/31/2007  T:  07/31/2007  Job:  [id number]

## 2010-11-09 NOTE — Op Note (Signed)
NAMENELSON, JULSON              ACCOUNT NO.:  [id number]   MEDICAL RECORD NO.:  [id number]          PATIENT TYPE:  AMB   LOCATION:  DAY                          FACILITY:  [facility]   PHYSICIAN:  Genia Del, M.D.DATE OF BIRTH:  [birth year]   DATE OF PROCEDURE:  07/31/2007  DATE OF DISCHARGE:                               OPERATIVE REPORT   PREOPERATIVE DIAGNOSIS:  Symptomatic complete uterine prolapse with  cystocele and stress urinary incontinence.   POSTOPERATIVE DIAGNOSIS:  Symptomatic complete uterine prolapse with  cystocele and stress urinary incontinence.   PROCEDURE:  Davinci subtotal hysterectomy with bilateral salpingo-  oophorectomy and sacrocolpopexy (sling procedure is dictated by Dr.  Sherron Monday).   SURGEON:  Genia Del, M.D.   ASSISTANT:  Gerri Spore B. Earlene Plater, M.D.   PROCEDURE IN DETAIL:  Under general anesthesia with endotracheal  intubation, the patient in the lithotomy position, she was prepped with  Betadine on the abdominal, suprapubic, vulvar and vaginal areas and  draped as usual.  The vaginal exam reveals a retroverted uterus, normal  volume with complete prolapse, no adnexal mass.  A cystocele, grade 2-  3/4, is present. Minimal rectocele. We put the Coring with the Rumi in  place vaginally without difficulty.  A #6 Rumi is used. The bladder is  catheterized and the Foley is left in place.  Abdominally, we make a  supraumbilical incision with the scalpel over 1.5 cm.  We opened the  aponeurosis under direct vision with the Mayo scissors and opened the  parietal peritoneum bluntly with a finger.  We make a pursestring stitch  with Vicryl 0 at the aponeurosis and we insert the [place] at that level  with the camera.  We inspect the abdominal and pelvic cavities.  No  adhesions are present.  No lesion is seen.  We then proceed with placing  all the robotic and assistant trocars.  We use a W configuration with  the assistant port on the right side. At  all incision sites, Marcaine  0.25% plain is used for local anesthesia.  We then docked the robot as  usual without difficulty.  We insert the instrument and Endoshear  scissor on the right hand, the fenestrated bipolar on the left hand, and  then the third robotic arm, we used the Cobra. We then go to the console  and start the surgery.   We visualized the ureters on each side and they are in normal anatomic  position.  We cauterize and section the left round ligament, then the  left infundibulopelvic ligament, and then follow down the left uterine  side, cauterize and section the left uterine artery after opening the  visceral peritoneum anteriorly and reclining the bladder downward.  We  proceed exactly the same on the right side.  We then reclined the  bladder further to allow about 5 cm to apply the sling anteriorly.  Posteriorly, we reclined the visceral peritoneum to about 4 cm down. We  then proceeded with resection of the uterus at the junction of the lower  uterine segment and cervix just above the uterosacral ligament.  We use  the tip of the Endoshear scissors and when necessary, the bipolar is  used for hemostasis.  The uterus is completely detached from the cervix  and it has both tubes and both ovaries with.  It was put in the left  gutter and will be removed with the morcellator at the end of the case.  We removed the Rumi with the Coring and we replaced the EEA vaginally.  Those will be used anteriorly and posteriorly to present the anterior  wall and the posterior wall for sling attachments. We then used the  fourth arm with Prograsp to retract the bowels towards the left and  expose the sacrum with the promontory. We opened the peritoneum at the  perimetry and we continued cautery opening the peritoneum with the  Endoshear scissors towards the cervix.  We then dissect the areolar  tissue at the level of the promontory and down to S3 and exposed the  presacral ligament.   We then go to trim the sling.  We used a large pore  Y-sling and it is trimmed to about 6 cm anteriorly and 4 cm posteriorly  and is narrowed. We then insert the sling inside the patient.  We start  on the anterior vaginal wall and anterior cervix.  We change instruments  to the cutting needle driver in the right hand and the normal needle  driver in the left hand.  We used Gore-Tex CV2 and make approximately  seven stitches anteriorly attaching the mesh to the anterior vaginal  wall and the anterior cervix, making sure that it is not going through  the vagina.  We proceed the same way on the posterior aspect of the  cervix and vagina and use five stitches of Gore-Tex CV2 at that level.  We then pull the mesh towards the sacrum and hold it there with the  Prograsp.  This is maintained in good position.   I go vaginally to verify the tension on the vagina and cervix and there  is no tension.  In fact, bringing about 1-2 cm more would help to the  outcome.  So, we go back to the console and pull another 1-2 cm.  We  trimmed the sling at the right level.  We then use a Gore-Tex CV2 to  make a first stitch at the level of S2-S3. We used a sliding square knot  to assure that this suture will stay firmly attached. We then use a NIKE, again, and put another stitch at S1 and a third one just on top  of the promontory.  Hemostasis is adequate. We then use a 2-0 Vicryl to  close the peritoneum in a running suture.  The mesh is well covered by  the peritoneum. Hemostasis is adequate at all levels.  The instruments  are, therefore, removed, the robot is undocked.  We go by laparoscopy  with the morcellator and morcellate the uterus, both tubes and both  ovaries.  We then irrigate and suction the abdominopelvic cavities.  We  remove all instruments, removed the trocars, evacuated the CO2.  We  attach a suture at the supraumbilical incision at the aponeurosis.  We  then make subcuticular  stitches of 4-0 Vicryl at all incisions and add  Dermabond on all incisions.  Hemostasis is adequate at all levels.  Then, Dr. Sherron Monday comes in for the sling procedure. He will dictate  separately.  The estimated blood loss was 50 mL.  No complications  occurred and  the patient was brought to the recovery room in good stable  status.  The count of instruments and sponges was complete x2.  The  patient received uterus and gentamicin at induction.      Genia Del, M.D.  Electronically Signed     ML/MEDQ  D:  07/31/2007  T:  07/31/2007  Job:  [id number]

## 2010-11-12 NOTE — Assessment & Plan Note (Signed)
[facility] OFFICE NOTE   MAGON, CROSON                       MRN:          [id number]  DATE:09/27/2006                            DOB:          15-Dec-1940    CHIEF COMPLAINT:  New patient to get established.   HISTORY OF PRESENT ILLNESS:  [name] [name] is a 70 year old nonsmoking,  married, white female comes in today for her first time visit, new to  Medicare, previously seen Dr. Karilyn Cota, GYN Dr. Vincente Poli and Dr. Seymour Bars,  scheduled to have in September a robotic surgery for her vaginal  prolapse and rectocele. She is generally well, carries a diagnosis of  hypothyroidism, status post thyroidectomy with hypothyroidism and goiter  in the 1970s and apparently on adequate replacement 0.1 mcg of Synthroid  brand. She also has a diagnosis of osteopenia porosis on Actonel,  vitamin D, and calcium. It is unclear whether she has ever had a vitamin  D level. She is also on hormone replacement of CombiPatch and is in  between GYNs and will need a refill on that medication. She has problems  with recurrent sinusitis about 3 to 4 times a year, usually with the  change of a season, usually needs an antibiotic to get better, it not on  any chronic allergy medications although has used Flonase in the past.   PAST MEDICAL HISTORY:  See data base, thyroidectomy in the 1970s,  gravida 1, para 1. Last pap 2007, mammogram 2007, colonoscopy 2006, Dr.  Luther Parody. She has had chicken pox as a child, history of a heart murmur  since a child with no consequence, seasonal rhinitis, some heartburn.   FAMILY HISTORY:  Father had alcoholism, had a cancer of his kidneys,  died when he was 63. Mother died in her 65s of either a CVA or  myocardial infarction. Maternal grandmother with heart disease, negative  thyroid or osteoporosis known in the family.   SOCIAL HISTORY:  Household of 2, lives with husband, multiple pets,  reduced  animals with [employer], 6 cats, couple of dogs. Negative tobacco,  tries to get regular exercise at [employer], is a semi-vegetarian. 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   REVIEW OF SYSTEMS:  Significant for chest pain, shortness of breath, or  cardiovascular symptoms.   MEDICATIONS:  1. Synthroid 100 mcg a day.  2. Actonel 35 mg once a week.  3. CombiPatch .05/.14.  4. Calcium.  5. Vitamin D.  6. Vitamin C.  7. B complex.   DRUG ALLERGIES:  None recorded.   OBJECTIVE:  Height 5 feet 2-1/4 inches, weight 156, pulse 72 and  regular, blood pressure 120/80.  GENERAL:  This is a well-developed, well-nourished, healthy-appearing  lady in no acute distress.  HEENT: Grossly normal. Oropharynx clear.  NECK: Supple without masses, thyromegaly, or bruits. No nodules are  felt.  CHEST: Clear to auscultation.  CARDIAC: S1, S2. I do not hear a murmur today.  EXTREMITIES:  Peripheral pulses present laterally, negative CT.  ABDOMEN:  Soft, no thyromegaly, guarding, or rebound.  SKIN: Nonicteric, no acute changes.   She brings in a number of her medical records and I have about an inch  and a half to review.   IMPRESSION:  1. Hypothyroidism, status post Graves and goiter on replacement.  2. Osteopenia/osteoporosis.  3. Uterine prolapse, to have surgery in the fall.  4. History of seasonal rhinitis, not particularly problematic although      does get recurrent sinusitis.  5. Question if whether history of high cholesterol, will have to      review records in this regard.   PLAN:  Will read records, schedule for physical, welcome to Medicare  this June or so with lab work and we will assess other needs at that  point and review her records. In the meantime we will refill the  medications and she will get back with [place] as needed otherwise.     [name] Mends. Panosh, MD  Electronically Signed    WKP/MedQ  DD: 09/27/2006  DT: 09/27/2006   Job #: [id number]

## 2011-03-08 ENCOUNTER — Ambulatory Visit (INDEPENDENT_AMBULATORY_CARE_PROVIDER_SITE_OTHER): Payer: Medicare Other | Admitting: Internal Medicine

## 2011-03-08 DIAGNOSIS — Z23 Encounter for immunization: Secondary | ICD-10-CM

## 2011-03-08 DIAGNOSIS — Z0000 Encounter for general adult medical examination without abnormal findings: Secondary | ICD-10-CM

## 2011-03-17 LAB — COMPREHENSIVE METABOLIC PANEL
ALT: 21
AST: 25
Albumin: 3.9
CO2: 26
Chloride: 106
Creatinine, Ser: 0.95
GFR calc Af Amer: >60
Potassium: 4.2
Sodium: 139
Total Bilirubin: 1.2

## 2011-03-17 LAB — CBC
MCV: 92.9
Platelets: 301
RBC: 4.4
WBC: 5.9

## 2011-03-17 LAB — APTT: aPTT: 31

## 2011-03-18 LAB — CBC
Hemoglobin: 11.1 — ABNORMAL LOW
Platelets: 202
RDW: 13.3
WBC: 9.4

## 2011-03-18 LAB — ABO/RH: ABO/RH(D): A POS

## 2011-03-18 LAB — TYPE AND SCREEN

## 2011-04-11 ENCOUNTER — Other Ambulatory Visit: Payer: Self-pay | Admitting: Internal Medicine

## 2011-04-29 ENCOUNTER — Encounter: Payer: Self-pay | Admitting: Internal Medicine

## 2011-04-29 ENCOUNTER — Ambulatory Visit (INDEPENDENT_AMBULATORY_CARE_PROVIDER_SITE_OTHER): Payer: Medicare Other | Admitting: Internal Medicine

## 2011-04-29 VITALS — BP 120/80 | HR 72 | Wt 165.0 lb

## 2011-04-29 DIAGNOSIS — R002 Palpitations: Secondary | ICD-10-CM

## 2011-04-29 DIAGNOSIS — Z8489 Family history of other specified conditions: Secondary | ICD-10-CM

## 2011-04-29 DIAGNOSIS — E039 Hypothyroidism, unspecified: Secondary | ICD-10-CM

## 2011-04-29 LAB — CBC WITH DIFFERENTIAL/PLATELET
Hemoglobin: 13.6 g/dL (ref 12.0–15.0)
Lymphocytes Relative: 42 % (ref 12–46)
Lymphs Abs: 2.4 10*3/uL (ref 0.7–4.0)
Neutro Abs: 2.6 10*3/uL (ref 1.7–7.7)
Neutrophils Relative %: 45 % (ref 43–77)
Platelets: 313 10*3/uL (ref 150–400)
RBC: 4.32 MIL/uL (ref 3.87–5.11)
WBC: 5.7 10*3/uL (ref 4.0–10.5)

## 2011-04-29 LAB — BASIC METABOLIC PANEL
BUN: 17 mg/dL (ref 6–23)
Creat: 0.8 mg/dL (ref 0.50–1.10)
Potassium: 4.2 mEq/L (ref 3.5–5.3)

## 2011-04-29 LAB — LIPID PANEL
HDL: 56 mg/dL (ref >39)
LDL Cholesterol: 134 mg/dL — ABNORMAL HIGH (ref 0–99)
Total CHOL/HDL Ratio: 3.7 Ratio
VLDL: 17 mg/dL (ref 0–40)

## 2011-04-29 LAB — HEPATIC FUNCTION PANEL
Albumin: 4.4 g/dL (ref 3.5–5.2)
Alkaline Phosphatase: 47 U/L (ref 39–117)
Bilirubin, Direct: 0.3 mg/dL (ref 0.0–0.3)
Indirect Bilirubin: 0.9 mg/dL (ref 0.0–0.9)
Total Bilirubin: 1.2 mg/dL (ref 0.3–1.2)

## 2011-04-29 LAB — TSH: TSH: 0.717 u[IU]/mL (ref 0.350–4.500)

## 2011-04-29 LAB — T3, FREE: T3, Free: 2.6 pg/mL (ref 2.3–4.2)

## 2011-04-29 NOTE — Patient Instructions (Signed)
Will notify you  of labs when available. Could be related to thyroid but will  And will arrange  refer to cardiology consult about this  To see if you should have further testing such as an echo and event monitor.

## 2011-04-29 NOTE — Assessment & Plan Note (Signed)
Sound like 5- 30 minutes of slow HR but unsure  . No other alarm features  ekg shows sinus brady 21 today  Mom SD age 69  Presumed heart.   Will advise cards consult. Opinion.

## 2011-04-29 NOTE — Progress Notes (Signed)
  Subjective:    Patient ID: [name] [name], female    DOB: 10-Jan-1941, 70 y.o.   MRN: [id number]  HPI Patient comes in today for SDA  acute problem evaluation.   Palpitation described as   "hears heart beating " Checked pulse on pulse ox and was 30 and then 45 and then 50- 60   Off an on for 6 weeks.  Last 5-30 minutes and not every day .  No racing and not really irregular .  No new meds  No fever other illness. No nocturnal sx     Caffeine:  decaff coffee and some starbucks.  No sig etoh  A few months ago has some orthostatic dizziness.  No syncope ? No palpitation. No pain in chest or arms   .  No cough cp sob  .    Review of Systems Neg cp sob cough   Bleeding syncope  Vision loss or mental changes numbness or weakness.No edema  Past history family history social history reviewed in the electronic medical record.     Outpatient Encounter Prescriptions as of 04/29/2011  Medication Sig Dispense Refill  . Calcium Carbonate (CALTRATE 600) 1500 MG TABS Take by mouth.        . cholecalciferol (VITAMIN D) 1000 UNITS tablet Take 1,000 Units by mouth daily.        Marland Kitchen estradiol-norethindrone (COMBIPATCH) 0.05-0.14 MG/DAY Place 1 patch onto the skin 2 (two) times a week.        Marland Kitchen LONGS B COMPLEX - C PO Take by mouth.        . risedronate (ACTONEL) 150 MG tablet Take 150 mg by mouth every 30 (thirty) days. with water on empty stomach, nothing by mouth or lie down for next 30 minutes.       Marland Kitchen SYNTHROID 100 MCG tablet TAKE ONE TABLET BY MOUTH DAILY  30 tablet  0       Objective:   Physical Exam WDWN in nad Looks well  Neck no bruits heard no masses Chest:  Clear to A&P without wheezes rales or rhonchi slight kyphosis CV:  S1-S2 no gallops or murmurs peripheral perfusion is normal Abdomen:  Sof,t normal bowel sounds without hepatosplenomegaly, no guarding rebound or masses no CVA tenderness  No clubbing cyanosis or edema Neuro grossly intact  Nl gait an station Oriented x 3 and no noted  deficits in memory, attention, and speech. EKG sinus brady 57 no acute changes      Assessment & Plan:  Palpitations ? Hx of brady cardia 35 -45 range  On  Pulse ox at home.   Currently no abnormality.   Thyroid disease  Never got lab check after change to generic ( see last ov and phone note) it has been a year since monitored  Family hx of sd mom 55

## 2011-05-04 NOTE — Progress Notes (Signed)
Left message on machine about results. 

## 2011-05-17 ENCOUNTER — Other Ambulatory Visit: Payer: Self-pay | Admitting: Internal Medicine

## 2011-05-27 ENCOUNTER — Encounter: Payer: Self-pay | Admitting: Cardiovascular Disease

## 2011-05-27 ENCOUNTER — Ambulatory Visit (INDEPENDENT_AMBULATORY_CARE_PROVIDER_SITE_OTHER): Payer: Medicare Other | Admitting: Cardiovascular Disease

## 2011-05-27 VITALS — BP 136/72 | HR 80 | Ht 63.0 in | Wt 164.8 lb

## 2011-05-27 DIAGNOSIS — R002 Palpitations: Secondary | ICD-10-CM

## 2011-05-27 NOTE — Patient Instructions (Signed)
Your physician has requested that you have an echocardiogram. Echocardiography is a painless test that uses sound waves to create images of your heart. It provides your doctor with information about the size and shape of your heart and how well your heart's chambers and valves are working. This procedure takes approximately one hour. There are no restrictions for this procedure.  Your physician has recommended that you wear a 24 hour holter monitor. Holter monitors are medical devices that record the heart's electrical activity. Doctors most often use these monitors to diagnose arrhythmias. Arrhythmias are problems with the speed or rhythm of the heartbeat. The monitor is a small, portable device. You can wear one while you do your normal daily activities. This is usually used to diagnose what is causing palpitations/syncope (passing out).  Your physician recommends that you continue on your current medications as directed. Please refer to the Current Medication list given to you today.  Your physician recommends that you schedule a follow-up appointment as needed

## 2011-05-31 ENCOUNTER — Encounter (INDEPENDENT_AMBULATORY_CARE_PROVIDER_SITE_OTHER): Payer: Medicare Other

## 2011-05-31 ENCOUNTER — Ambulatory Visit (HOSPITAL_COMMUNITY): Payer: Medicare Other | Attending: Cardiology | Admitting: Radiology

## 2011-05-31 DIAGNOSIS — I517 Cardiomegaly: Secondary | ICD-10-CM

## 2011-05-31 DIAGNOSIS — I059 Rheumatic mitral valve disease, unspecified: Secondary | ICD-10-CM | POA: Insufficient documentation

## 2011-05-31 DIAGNOSIS — R002 Palpitations: Secondary | ICD-10-CM

## 2011-05-31 DIAGNOSIS — I079 Rheumatic tricuspid valve disease, unspecified: Secondary | ICD-10-CM | POA: Insufficient documentation

## 2011-06-06 NOTE — Assessment & Plan Note (Signed)
No clear etiology. Patient does not drink excessive caffeine. No exam evidence of cardiac abnormality. With reports of bradycardia and ongoing symptoms, recommend 24 hour Holter monitor and 2D echocardiogram to rule out structural heart abnormality. Will see back in follow-up if either study is negative, otherwise reassurance. No indication for ischemic testing as patient has no angina and has normal 12 lead EKG.

## 2011-06-06 NOTE — Progress Notes (Signed)
HPI:  70 year-old woman presenting for initial evaluation of palpitations and chest pain. She has no personal history of cardiac disease, but notes that her mother died suddenly at age 49. The patient has had rare fleeting chest pains, left-sided, but no exertional symptoms. No chest pain lasting more than a minute. She has noted slow heart rates on home monitoring, also feels heartbeat in ear. Symptoms have occurred for about 8 weeks with no past history of the same. Also complains of dyspnea with heavy exertion. She is not engaged in regular exercise, but walks her dogs about 20 minutes per day. No lightheadedness, syncope, orthopnea, or PND.  Outpatient Encounter Prescriptions as of 05/27/2011  Medication Sig Dispense Refill  . Calcium Carbonate (CALTRATE 600) 1500 MG TABS Take by mouth.        . cholecalciferol (VITAMIN D) 1000 UNITS tablet Take 1,000 Units by mouth daily.        Marland Kitchen estradiol-norethindrone (COMBIPATCH) 0.05-0.14 MG/DAY Place 1 patch onto the skin 2 (two) times a week.        Marland Kitchen LONGS B COMPLEX - C PO Take by mouth.        . risedronate (ACTONEL) 150 MG tablet Take 150 mg by mouth every 30 (thirty) days. with water on empty stomach, nothing by mouth or lie down for next 30 minutes.       Marland Kitchen SYNTHROID 100 MCG tablet TAKE ONE TABLET BY MOUTH DAILY  30 tablet  10    Review of patient's allergies indicates no known allergies.  Past Medical History  Diagnosis Date  . Hypothyroidism     post thyroidectomy  . Osteopenia     t -2.5 hip in 2005 by Dexa  . Osteoporosis   . Seasonal rhinitis     spring and fall  . Skin cancer     squamous cell ca chest  . Elevated lipids   . Vaginal prolapse     hs corrected    Past Surgical History  Procedure Date  . Thyroidectomy   . Colonoscopy 2006  . Abdominal hysterectomy 07/2007  . Bladders sling surgery 07/2007    History   Social History  . Marital Status: Married    Spouse Name: N/A    Number of Children: N/A  . Years of  Education: N/A   Occupational History  . Not on file.   Social History Main Topics  . Smoking status: Never Smoker   . Smokeless tobacco: Not on file  . Alcohol Use: Yes  . Drug Use: No  . Sexually Active: Not on file   Other Topics Concern  . 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 Heart attack Mother   . Alcohol abuse Mother   . Alcohol abuse Other   . Kidney disease Other   . Cancer Other     kidne  . Heart disease Other     ROS: General: no fevers/chills/night sweats Eyes: no blurry vision, diplopia, or amaurosis ENT: no sore throat or hearing loss Resp: no cough, wheezing, or hemoptysis CV: no edema or palpitations GI: no abdominal pain, nausea, vomiting, diarrhea, or constipation GU: no dysuria, frequency, or hematuria Skin: no rash Neuro: no headache, numbness, tingling, or weakness of extremities Musculoskeletal: no joint pain or swelling Heme: no bleeding,  DVT, or easy bruising Endo: no polydipsia or polyuria  BP 136/72  Pulse 80  Ht 5\' 3"  (1.6 m)  Wt 74.753 kg (164 lb 12.8 oz)  BMI 29.19 kg/m2  PHYSICAL EXAM: Pt is alert and oriented, WD, WN, in no distress. HEENT: normal Neck: JVP normal. Carotid upstrokes normal without bruits. No thyromegaly. Lungs: equal expansion, clear bilaterally CV: Apex is discrete and nondisplaced, RRR without murmur or gallop Abd: soft, NT, +BS, no bruit, no hepatosplenomegaly Back: no CVA tenderness Ext: no C/C/E        Femoral pulses 2+= without bruits        DP/PT pulses intact and = Skin: warm and dry without rash Neuro: CNII-XII intact             Strength intact = bilaterally  EKG:  04/29/11: Sinus brady 57 bpm, within normal limits.  ASSESSMENT AND PLAN:

## 2011-06-07 ENCOUNTER — Telehealth: Payer: Self-pay | Admitting: Cardiovascular Disease

## 2011-06-07 NOTE — Telephone Encounter (Signed)
FU Call: Pt calling wanting to know results of pt holter monitor. Please return pt call to discuss further.

## 2011-06-07 NOTE — Telephone Encounter (Signed)
PT AWARE OF MONITOR AND ECHO RESULTS  DOES NOT WISH TO TRY TOPROL 25 MG AT THIS TIME WILL CALL BACK  IF PALPITATIONS INCREASE IN FREQUENCY AND WANTS TO INITIATE TOPROL .Zack Seal

## 2011-06-07 NOTE — Telephone Encounter (Signed)
LMTCB ./CY 

## 2011-06-07 NOTE — Telephone Encounter (Signed)
F/up:  Please call her back.  She has returned your call

## 2011-06-30 DIAGNOSIS — M25569 Pain in unspecified knee: Secondary | ICD-10-CM | POA: Diagnosis not present

## 2011-07-05 DIAGNOSIS — Z1231 Encounter for screening mammogram for malignant neoplasm of breast: Secondary | ICD-10-CM | POA: Diagnosis not present

## 2011-07-21 DIAGNOSIS — M25569 Pain in unspecified knee: Secondary | ICD-10-CM | POA: Diagnosis not present

## 2011-07-28 DIAGNOSIS — M25569 Pain in unspecified knee: Secondary | ICD-10-CM | POA: Diagnosis not present

## 2011-08-12 ENCOUNTER — Encounter: Payer: Self-pay | Admitting: Internal Medicine

## 2011-08-12 DIAGNOSIS — S83289A Other tear of lateral meniscus, current injury, unspecified knee, initial encounter: Secondary | ICD-10-CM | POA: Diagnosis not present

## 2011-08-12 DIAGNOSIS — M1710 Unilateral primary osteoarthritis, unspecified knee: Secondary | ICD-10-CM | POA: Diagnosis not present

## 2011-08-26 ENCOUNTER — Other Ambulatory Visit: Payer: Self-pay | Admitting: Orthopedic Surgery

## 2011-09-13 DIAGNOSIS — M23302 Other meniscus derangements, unspecified lateral meniscus, unspecified knee: Secondary | ICD-10-CM | POA: Diagnosis not present

## 2011-09-13 DIAGNOSIS — M1710 Unilateral primary osteoarthritis, unspecified knee: Secondary | ICD-10-CM | POA: Diagnosis not present

## 2011-10-14 ENCOUNTER — Encounter (HOSPITAL_BASED_OUTPATIENT_CLINIC_OR_DEPARTMENT_OTHER): Admission: RE | Payer: Self-pay | Source: Ambulatory Visit

## 2011-10-14 ENCOUNTER — Ambulatory Visit (HOSPITAL_BASED_OUTPATIENT_CLINIC_OR_DEPARTMENT_OTHER): Admission: RE | Admit: 2011-10-14 | Payer: Medicare Other | Source: Ambulatory Visit | Admitting: Orthopedic Surgery

## 2011-10-14 SURGERY — ARTHROSCOPY, KNEE
Anesthesia: General | Laterality: Right

## 2011-11-01 DIAGNOSIS — Z124 Encounter for screening for malignant neoplasm of cervix: Secondary | ICD-10-CM | POA: Diagnosis not present

## 2011-11-01 DIAGNOSIS — Z01419 Encounter for gynecological examination (general) (routine) without abnormal findings: Secondary | ICD-10-CM | POA: Diagnosis not present

## 2011-11-13 DIAGNOSIS — M25469 Effusion, unspecified knee: Secondary | ICD-10-CM | POA: Diagnosis not present

## 2011-11-13 DIAGNOSIS — S8000XA Contusion of unspecified knee, initial encounter: Secondary | ICD-10-CM | POA: Diagnosis not present

## 2011-11-13 DIAGNOSIS — S0990XA Unspecified injury of head, initial encounter: Secondary | ICD-10-CM | POA: Diagnosis not present

## 2011-11-14 DIAGNOSIS — S8000XA Contusion of unspecified knee, initial encounter: Secondary | ICD-10-CM | POA: Diagnosis not present

## 2011-11-24 DIAGNOSIS — M25569 Pain in unspecified knee: Secondary | ICD-10-CM | POA: Diagnosis not present

## 2011-12-01 DIAGNOSIS — M25569 Pain in unspecified knee: Secondary | ICD-10-CM | POA: Diagnosis not present

## 2011-12-05 DIAGNOSIS — M25569 Pain in unspecified knee: Secondary | ICD-10-CM | POA: Diagnosis not present

## 2011-12-08 DIAGNOSIS — S8000XA Contusion of unspecified knee, initial encounter: Secondary | ICD-10-CM | POA: Diagnosis not present

## 2011-12-12 DIAGNOSIS — S8000XA Contusion of unspecified knee, initial encounter: Secondary | ICD-10-CM | POA: Diagnosis not present

## 2011-12-15 DIAGNOSIS — S8000XA Contusion of unspecified knee, initial encounter: Secondary | ICD-10-CM | POA: Diagnosis not present

## 2011-12-19 DIAGNOSIS — S8000XA Contusion of unspecified knee, initial encounter: Secondary | ICD-10-CM | POA: Diagnosis not present

## 2011-12-20 DIAGNOSIS — M25569 Pain in unspecified knee: Secondary | ICD-10-CM | POA: Diagnosis not present

## 2011-12-20 DIAGNOSIS — Q7290 Unspecified reduction defect of unspecified lower limb: Secondary | ICD-10-CM | POA: Diagnosis not present

## 2012-03-15 DIAGNOSIS — D3130 Benign neoplasm of unspecified choroid: Secondary | ICD-10-CM | POA: Diagnosis not present

## 2012-03-16 ENCOUNTER — Other Ambulatory Visit: Payer: Self-pay | Admitting: Family Medicine

## 2012-03-16 ENCOUNTER — Telehealth: Payer: Self-pay | Admitting: Internal Medicine

## 2012-03-16 DIAGNOSIS — Z0000 Encounter for general adult medical examination without abnormal findings: Secondary | ICD-10-CM

## 2012-03-16 NOTE — Telephone Encounter (Signed)
Pt is on synthroid med requesting tsh and chole blood work. Pt last appt was 04-2011. Can I sch?

## 2012-03-16 NOTE — Telephone Encounter (Signed)
Please make CPE appt for this pt  Will be Medicare Wellness Exam.  Thanks!!  Fit her in where you can.

## 2012-03-16 NOTE — Telephone Encounter (Signed)
Lm on pt cell

## 2012-03-19 NOTE — Telephone Encounter (Signed)
Pt called and has been sch for [facility] Wellness as noted.

## 2012-03-20 ENCOUNTER — Ambulatory Visit (INDEPENDENT_AMBULATORY_CARE_PROVIDER_SITE_OTHER): Payer: Medicare Other

## 2012-03-20 DIAGNOSIS — L905 Scar conditions and fibrosis of skin: Secondary | ICD-10-CM | POA: Diagnosis not present

## 2012-03-20 DIAGNOSIS — Z85828 Personal history of other malignant neoplasm of skin: Secondary | ICD-10-CM | POA: Diagnosis not present

## 2012-03-20 DIAGNOSIS — Z23 Encounter for immunization: Secondary | ICD-10-CM | POA: Diagnosis not present

## 2012-03-20 DIAGNOSIS — L570 Actinic keratosis: Secondary | ICD-10-CM | POA: Diagnosis not present

## 2012-03-20 DIAGNOSIS — L821 Other seborrheic keratosis: Secondary | ICD-10-CM | POA: Diagnosis not present

## 2012-04-09 ENCOUNTER — Encounter: Payer: Self-pay | Admitting: Internal Medicine

## 2012-04-09 ENCOUNTER — Ambulatory Visit (INDEPENDENT_AMBULATORY_CARE_PROVIDER_SITE_OTHER): Payer: Medicare Other | Admitting: Internal Medicine

## 2012-04-09 VITALS — BP 164/90 | HR 78 | Temp 97.9°F | Wt 163.0 lb

## 2012-04-09 DIAGNOSIS — T148XXA Other injury of unspecified body region, initial encounter: Secondary | ICD-10-CM | POA: Diagnosis not present

## 2012-04-09 DIAGNOSIS — S6390XA Sprain of unspecified part of unspecified wrist and hand, initial encounter: Secondary | ICD-10-CM | POA: Diagnosis not present

## 2012-04-09 DIAGNOSIS — S6991XA Unspecified injury of right wrist, hand and finger(s), initial encounter: Secondary | ICD-10-CM

## 2012-04-09 DIAGNOSIS — S71009A Unspecified open wound, unspecified hip, initial encounter: Secondary | ICD-10-CM | POA: Diagnosis not present

## 2012-04-09 DIAGNOSIS — S6990XA Unspecified injury of unspecified wrist, hand and finger(s), initial encounter: Secondary | ICD-10-CM

## 2012-04-09 DIAGNOSIS — W540XXA Bitten by dog, initial encounter: Secondary | ICD-10-CM

## 2012-04-09 DIAGNOSIS — S59909A Unspecified injury of unspecified elbow, initial encounter: Secondary | ICD-10-CM | POA: Diagnosis not present

## 2012-04-09 DIAGNOSIS — S71159A Open bite, unspecified thigh, initial encounter: Secondary | ICD-10-CM

## 2012-04-09 DIAGNOSIS — M79609 Pain in unspecified limb: Secondary | ICD-10-CM | POA: Diagnosis not present

## 2012-04-09 DIAGNOSIS — R229 Localized swelling, mass and lump, unspecified: Secondary | ICD-10-CM | POA: Diagnosis not present

## 2012-04-09 DIAGNOSIS — S71109A Unspecified open wound, unspecified thigh, initial encounter: Secondary | ICD-10-CM | POA: Diagnosis not present

## 2012-04-09 HISTORY — DX: Bitten by dog, initial encounter: S71.159A

## 2012-04-09 MED ORDER — AMOXICILLIN-POT CLAVULANATE 875-125 MG PO TABS: 1.0000 | ORAL_TABLET | Freq: Two times a day (BID) | ORAL | Status: DC

## 2012-04-09 NOTE — Progress Notes (Signed)
  Subjective:    Patient ID: [name] [name], female    DOB: 10-01-1940, 71 y.o.   MRN: [id number]  HPI Patient comes in today for SDA for  new problem evaluation. This weekend  Yesterday ;she was in the dog park with her dog and another dog he was on the leash lunged at her thigh after she offered was going to offer it at dog biscuit. The owner of the dog was surprised and she did fall to the ground and didn't get information from the owner about immunization status of the dog. She states that it was a carrier boxer possibly pit bull mix This morning she noted that there was some using from the site on the right thigh didn't hurt that much. She also had fallen on her right hand wrist and had seen Dr. Marianne Sofia 10 earlier this morning who x-rayed her wrist and noted no fracture he also looked at the wound and put some Steri-Strips on it although it is oozing some. She took some left over ampicillin in case. In 2011 she went through rabies series because she was feeding feral cats and acute and came up and bit her was never to be seen again. She did complete his immunizations anxiety death at the same time States that her blood pressures usually good is up-to-date probably from stress of being here. Review of Systems No fever cp sob increasing dc and pain  Past history family history social history reviewed in the electronic medical record.     Objective:   Physical Exam BP 164/90  Pulse 78  Temp 97.9 F (36.6 C) (Oral)  Wt 163 lb (73.936 kg)  SpO2 97% Well-developed well-nourished in no acute distress gait within normal limits nonantalgic. Examination of right wrist shows minimal swelling and some redness no bruising examination of right thigh shows an area of about 7-8 cm of subcutaneous bruising without hematoma or mass effect is Steri-Strips over 2 puncture abrasion wounds. No pus is noted no significant tenderness. There is no streaking or warmth     Assessment & Plan:  Dog bite right  thigh puncture wound with bruising  contusion At some risk of infection because of the bruising affect reasonable to add Augmentin 1 twice a day for 5-7 days keep the wound clean    Expectant management. Reviewed and updated her rabies information and T.. As far as I can tell looked on cdc and uptodate sites  if we think that she had a rapid exposure she would need to dose booster shot. However the risk would be very low but it is unknown.  Blood pressure elevation today she will recheck at [facility] or other followup if continued elevation or any signs of infection. She is due for a check in January otherwise Total visit > 50% spent counseling and coordinating care

## 2012-04-09 NOTE — Patient Instructions (Addendum)
At risk for infection because of the bruising and bleeding .  Add Augmentin for 5 days.  You are UTD on the tdap  The information i can find is that persons exposed to rabies after intital series get  2 series booster  dont see any information about time frame.  It doesent sound like the animal had rabies but would be reassuring to know that  The dogs shots were  Up to date.

## 2012-04-13 ENCOUNTER — Other Ambulatory Visit: Payer: Self-pay | Admitting: Family Medicine

## 2012-04-13 MED ORDER — LEVOTHYROXINE SODIUM 100 MCG PO TABS: 100.0000 ug | ORAL_TABLET | Freq: Every day | ORAL | Status: DC

## 2012-04-17 DIAGNOSIS — M545 Low back pain: Secondary | ICD-10-CM | POA: Diagnosis not present

## 2012-04-20 ENCOUNTER — Other Ambulatory Visit (HOSPITAL_COMMUNITY): Payer: Self-pay | Admitting: Orthopedic Surgery

## 2012-04-20 DIAGNOSIS — M545 Low back pain: Secondary | ICD-10-CM

## 2012-04-23 ENCOUNTER — Other Ambulatory Visit (HOSPITAL_COMMUNITY): Payer: Self-pay | Admitting: Diagnostic Radiology

## 2012-04-23 ENCOUNTER — Ambulatory Visit (HOSPITAL_COMMUNITY)
Admission: RE | Admit: 2012-04-23 | Discharge: 2012-04-23 | Disposition: A | Discharge status: Home / Self Care | Payer: Medicare Other | Source: Ambulatory Visit | Attending: Diagnostic Radiology | Admitting: Diagnostic Radiology

## 2012-04-23 ENCOUNTER — Encounter (HOSPITAL_COMMUNITY): Payer: Medicare Other

## 2012-04-23 ENCOUNTER — Encounter (HOSPITAL_COMMUNITY)
Admission: RE | Admit: 2012-04-23 | Discharge: 2012-04-23 | Disposition: A | Discharge status: Home / Self Care | Payer: Medicare Other | Source: Ambulatory Visit | Attending: Orthopedic Surgery | Admitting: Orthopedic Surgery

## 2012-04-23 DIAGNOSIS — M545 Low back pain, unspecified: Secondary | ICD-10-CM | POA: Diagnosis not present

## 2012-04-23 DIAGNOSIS — S32009A Unspecified fracture of unspecified lumbar vertebra, initial encounter for closed fracture: Secondary | ICD-10-CM | POA: Diagnosis not present

## 2012-04-23 DIAGNOSIS — IMO0002 Reserved for concepts with insufficient information to code with codable children: Secondary | ICD-10-CM | POA: Diagnosis not present

## 2012-04-23 DIAGNOSIS — W19XXXA Unspecified fall, initial encounter: Secondary | ICD-10-CM

## 2012-04-23 DIAGNOSIS — X58XXXA Exposure to other specified factors, initial encounter: Secondary | ICD-10-CM | POA: Insufficient documentation

## 2012-04-23 MED ORDER — TECHNETIUM TC 99M MEDRONATE IV KIT
25.0000 | PACK | Freq: Once | INTRAVENOUS | Status: AC | PRN
  Administered 2012-04-23: 25 via INTRAVENOUS

## 2012-05-08 DIAGNOSIS — S32009A Unspecified fracture of unspecified lumbar vertebra, initial encounter for closed fracture: Secondary | ICD-10-CM | POA: Diagnosis not present

## 2012-06-07 DIAGNOSIS — S32009A Unspecified fracture of unspecified lumbar vertebra, initial encounter for closed fracture: Secondary | ICD-10-CM | POA: Diagnosis not present

## 2012-06-13 ENCOUNTER — Other Ambulatory Visit: Payer: Self-pay | Admitting: Family Medicine

## 2012-06-13 MED ORDER — LEVOTHYROXINE SODIUM 100 MCG PO TABS: 100.0000 ug | ORAL_TABLET | Freq: Every day | ORAL | Status: DC

## 2012-07-02 DIAGNOSIS — J111 Influenza due to unidentified influenza virus with other respiratory manifestations: Secondary | ICD-10-CM | POA: Diagnosis not present

## 2012-07-16 ENCOUNTER — Ambulatory Visit (INDEPENDENT_AMBULATORY_CARE_PROVIDER_SITE_OTHER): Payer: Medicare Other | Admitting: Internal Medicine

## 2012-07-16 ENCOUNTER — Encounter: Payer: Self-pay | Admitting: Internal Medicine

## 2012-07-16 VITALS — BP 124/70 | HR 72 | Temp 97.9°F | Ht 61.25 in | Wt 160.0 lb

## 2012-07-16 DIAGNOSIS — M810 Age-related osteoporosis without current pathological fracture: Secondary | ICD-10-CM

## 2012-07-16 DIAGNOSIS — E039 Hypothyroidism, unspecified: Secondary | ICD-10-CM

## 2012-07-16 DIAGNOSIS — C4490 Unspecified malignant neoplasm of skin, unspecified: Secondary | ICD-10-CM | POA: Insufficient documentation

## 2012-07-16 DIAGNOSIS — Z0000 Encounter for general adult medical examination without abnormal findings: Secondary | ICD-10-CM | POA: Diagnosis not present

## 2012-07-16 LAB — CBC WITH DIFFERENTIAL/PLATELET
Basophils Relative: 0.2 % (ref 0.0–3.0)
Eosinophils Relative: 4.8 % (ref 0.0–5.0)
HCT: 38.7 % (ref 36.0–46.0)
Hemoglobin: 13 g/dL (ref 12.0–15.0)
Lymphs Abs: 2.2 10*3/uL (ref 0.7–4.0)
MCV: 94.2 fl (ref 78.0–100.0)
Monocytes Absolute: 0.5 10*3/uL (ref 0.1–1.0)
Monocytes Relative: 9.2 % (ref 3.0–12.0)
Neutro Abs: 2.2 10*3/uL (ref 1.4–7.7)
Platelets: 278 10*3/uL (ref 150.0–400.0)
RBC: 4.11 Mil/uL (ref 3.87–5.11)
WBC: 5.1 10*3/uL (ref 4.5–10.5)

## 2012-07-16 LAB — LIPID PANEL
Cholesterol: 188 mg/dL (ref 0–200)
LDL Cholesterol: 122 mg/dL — ABNORMAL HIGH (ref 0–99)
Triglycerides: 61 mg/dL (ref 0.0–149.0)

## 2012-07-16 LAB — HEPATIC FUNCTION PANEL
ALT: 19 U/L (ref 0–35)
AST: 25 U/L (ref 0–37)
Total Protein: 6.8 g/dL (ref 6.0–8.3)

## 2012-07-16 LAB — BASIC METABOLIC PANEL
BUN: 20 mg/dL (ref 6–23)
Chloride: 106 mEq/L (ref 96–112)
GFR: 86.26 mL/min (ref >60.00)
Potassium: 4 mEq/L (ref 3.5–5.1)
Sodium: 140 mEq/L (ref 135–145)

## 2012-07-16 NOTE — Progress Notes (Signed)
Chief Complaint  Patient presents with  . Annual Exam    Medicare    HPI: Patient comes in today for Preventive Medicare wellness visit . No major injuries, ed visits ,hospitalizations , new medications since last visit.  See visit regarding dog  Related injury and fall  Dr Leslee Home states had compression fx better  Still on   Thyroid med and hrt per gyne .   Has osteoporosis on vit d and actonel monthly.    Had life line  Screening and ok    Hearing:  No   Vision:  No limitations at present . Last eye check UTD  Safety:  Has smoke detector and wears seat belts.  No firearms. No excess sun exposure. Sees dentist regularly.  Falls: no except above with dog injury .   Advance directive :  Reviewed    Memory: Felt to be good  , no concern from her or her family.  Depression: No anhedonia unusual crying or depressive symptoms  Nutrition: Eats well balanced diet; adequate calcium and vitamin D. No swallowing chewing problems.  Injury: no major injuries in the last six months. Except the  Dog issue   Attack slight fracture l4 l5  [place].   Other healthcare providers:  Reviewed today .  Social:  Lives with spouse married  3 . Dog  4 cats   Preventive parameters: up-to-date  Reviewed   ADLS:   There are no problems or need for assistance  driving, feeding, obtaining food, dressing, toileting and bathing, managing money using phone. She is independent.  EXERCISE/ HABITS  Per week  Walk every day has dogs   No tobacco    etoh ocassional    ROS:  GEN/ HEENT: No fever, significant weight changes sweats headaches vision problems hearing changes, CV/ PULM; No chest pain shortness of breath cough, syncope,edema  change in exercise tolerance. GI /GU: No adominal pain, vomiting, change in bowel habits. No blood in the stool. No significant GU symptoms. SKIN/HEME: ,no acute skin rashes suspicious lesions or bleeding. No lymphadenopathy, nodules, masses.  NEURO/ PSYCH:  No  neurologic signs such as weakness numbness. No depression anxiety. IMM/ Allergy: No unusual infections.  Allergy .   REST of 12 system review negative except as per HPI   Past Medical History  Diagnosis Date  . Hypothyroidism     post thyroidectomy  . Osteopenia     t -2.5 hip in 2005 by [facility]  . Osteoporosis     commpr fx after dog related fall  injury  . Seasonal rhinitis     spring and fall  . Skin cancer     squamous cell ca chest  . Elevated lipids   . Vaginal prolapse     hs corrected    Family History  Problem Relation Age of Onset  . Heart attack Mother   . Alcohol abuse Mother   . Alcohol abuse Other   . Kidney disease Other   . Cancer Other     kidne  . Heart disease Other     History   Social History  . Marital Status: Married    Spouse Name: N/A    Number of Children: N/A  . Years of Education: N/A   Social History Main Topics  . Smoking status: Never Smoker   . Smokeless tobacco: None  . Alcohol Use: Yes  . Drug Use: No  . Sexually Active: None   Other Topics Concern  . 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 Calcium Carbonate (CALTRATE 600) 1500 MG TABS Take by mouth.        . cholecalciferol (VITAMIN D) 1000 UNITS tablet Take 1,000 Units by mouth daily.        Marland Kitchen estradiol-norethindrone (COMBIPATCH) 0.05-0.14 MG/DAY Place 1 patch onto the skin 2 (two) times a week.        . levothyroxine (SYNTHROID) 100 MCG tablet Take 1 tablet (100 mcg total) by mouth daily.  30 tablet  0  . LONGS B COMPLEX - C PO Take by mouth.        . risedronate (ACTONEL) 150 MG tablet Take 150 mg by mouth every 30 (thirty) days. with water on empty stomach, nothing by mouth or lie down for next 30 minutes.       . [DISCONTINUED]  amoxicillin-clavulanate (AUGMENTIN) 875-125 MG per tablet Take 1 tablet by mouth every 12 (twelve) hours.  14 tablet  0    EXAM:  BP 124/70  Pulse 72  Temp 97.9 F (36.6 C)  Ht 5' 1.25" (1.556 m)  Wt 160 lb (72.576 kg)  BMI 29.99 kg/m2  SpO2 94%  Body mass index is 29.99 kg/(m^2).  Physical Exam: Vital signs reviewed OZH:YQMV is a well-developed well-nourished alert cooperative   who appears stated age in no acute distress.  HEENT: normocephalic atraumatic , Eyes: PERRL EOM's full, conjunctiva clear, Nares: paten,t no deformity discharge or tenderness., Ears: no deformity EAC's clear TMs with normal landmarks. Mouth: clear OP, no lesions, edema.  Moist mucous membranes. Dentition in adequate repair. NECK: supple without masses, thyromegaly or bruits. CHEST/PULM:  Clear to auscultation and percussion breath sounds equal no wheeze , rales or rhonchi. No chest wall deformities or tenderness. Breast: normal by inspection . No dimpling, discharge, masses, tenderness or discharge . misl kyphosis  CV: PMI is nondisplaced, S1 S2 no gallops, murmurs, rubs. Peripheral pulses are full without delay.No JVD .  ABDOMEN: Bowel sounds normal nontender  No guard or rebound, no hepato splenomegal no CVA tenderness.  No hernia. Extremtities:  No clubbing cyanosis or edema, no acute joint swelling or redness no focal atrophy NEURO:  Oriented x3, cranial nerves 3-12 appear to be intact, no obvious focal weakness,gait within normal limits no abnormal reflexes or asymmetrical SKIN: No acute rashes normal turgor, color, no bruising or petechiae. Sun changes    PSYCH: Oriented, good eye contact, no obvious depression anxiety, cognition and judgment appear normal. LN: no cervical axillary inguinal adenopathy No noted deficits in memory, attention, and speech.   Lab Results  Component Value Date   WBC 5.7 04/29/2011   HGB 13.6 04/29/2011   HCT 40.7 04/29/2011   PLT 313 04/29/2011   GLUCOSE 82 04/29/2011    CHOL 207* 04/29/2011   TRIG 87 04/29/2011   HDL 56 04/29/2011   LDLDIRECT 134.1 12/27/2006   LDLCALC 134* 04/29/2011   ALT 16 04/29/2011   AST 25 04/29/2011   NA 139 04/29/2011   K 4.2 04/29/2011   CL 105 04/29/2011   CREATININE 0.80 04/29/2011   BUN 17 04/29/2011   CO2 25 04/29/2011   TSH 0.717 04/29/2011   INR 1.0 07/25/2007    ASSESSMENT AND PLAN:  Discussed the following assessment and plan:  1. Medicare annual wellness visit, subsequent  Basic metabolic panel, CBC with Differential, Hepatic function panel, Lipid panel, TSH, Vitamin D 25 hydroxy   to get mammo soon to contact [facility] to see when due for colon.  2. OSTEOPOROSIS  Basic metabolic panel, CBC with Differential, Hepatic function panel, Lipid panel, TSH, Vitamin D 25 hydroxy   on meds  compr fx with dog related injury  3. HYPOTHYROIDISM  Basic metabolic panel, CBC with Differential, Hepatic function panel, Lipid panel, TSH, Vitamin D 25 hydroxy   level today     Patient Care Team: Madelin Headings, MD as PCP - General Genia Del, MD as Attending Physician (Obstetrics and Gynecology) [place] Girard Cooter as Referring Physician (Dermatology)  Patient Instructions  Continue lifestyle intervention healthy eating and exercise . Will notify you  of labs when available. If ok then yearly check   Preventive Care for Adults, Female A healthy lifestyle and preventive care can promote health and wellness. Preventive health guidelines for women include the following key practices.  A routine yearly physical is a good way to check with your caregiver about your health and preventive screening. It is a chance to share any concerns and updates on your health, and to receive a thorough exam.  Visit your dentist for a routine exam and preventive care every 6 months. Brush your teeth twice a day and floss once a day. Good oral hygiene prevents tooth decay and gum disease.  The frequency of eye exams is based on your age, health, family  medical history, use of contact lenses, and other factors. Follow your caregiver's recommendations for frequency of eye exams.  Eat a healthy diet. Foods like vegetables, fruits, whole grains, low-fat dairy products, and lean protein foods contain the nutrients you need without too many calories. Decrease your intake of foods high in solid fats, added sugars, and salt. Eat the right amount of calories for you.Get information about a proper diet from your caregiver, if necessary.  Regular physical exercise is one of the most important things you can do for your health. Most adults should get at least 150 minutes of moderate-intensity exercise (any activity that increases your heart rate and causes you to sweat) each week. In addition, most adults need muscle-strengthening exercises on 2 or more days a week.  Maintain a healthy weight. The body mass index (BMI) is a screening tool to identify possible weight problems. It provides an estimate of body fat based on height and weight. Your caregiver can help determine your BMI, and can help you achieve or maintain a healthy weight.For adults 20 years and older:  A BMI below 18.5 is considered underweight.  A BMI of 18.5 to 24.9 is normal.  A BMI of 25 to 29.9 is considered overweight.  A BMI of 30 and above is considered obese.  Maintain normal blood lipids and cholesterol levels by exercising and minimizing your intake of saturated fat. Eat a balanced diet with plenty of fruit and vegetables. Blood tests for lipids and cholesterol should begin at age 32 and be repeated every 5 years. If your lipid or cholesterol levels are high, you are over 50, or you are at high risk for heart disease, you may need your cholesterol levels checked more frequently.Ongoing high lipid and cholesterol levels should be treated with medicines if diet and exercise are not effective.  If you smoke, find out from your caregiver how to quit. If you do not use tobacco, do not  start.  If you are pregnant, do not drink alcohol. If you are breastfeeding, be very cautious about drinking alcohol. If you are not pregnant and choose to drink alcohol, do not exceed 1 drink per day. One drink is considered to be 12 ounces (355 mL) of beer, 5 ounces (148 mL) of wine, or 1.5 ounces (44 mL) of liquor.  Avoid use of street drugs. Do not share needles with anyone. Ask for help if you need support or instructions about stopping the use of drugs.  High blood pressure causes heart disease and increases the risk of stroke. Your blood pressure should be checked at least every 1 to 2 years. Ongoing high blood pressure should be treated with medicines if weight loss and exercise are not effective.  If you are 16 to 71 years old, ask your caregiver if you should take aspirin to prevent strokes.  Diabetes screening involves taking a blood sample to check your fasting blood sugar level. This should be done once every 3 years, after age 80, if you are within normal weight and without risk factors for diabetes. Testing should be considered at a younger age or be carried out more frequently if you are overweight and have at least 1 risk factor for diabetes.  Breast cancer screening is essential preventive care for women. You should practice "breast self-awareness." This means understanding the normal appearance and feel of your breasts and may include breast self-examination. Any changes detected, no matter how small, should be reported to a caregiver. Women in their 57s and 30s should have a clinical breast exam (CBE) by a caregiver as part of a regular health exam every 1 to 3 years. After age 52, women should have a CBE every year. Starting at age 64, women should consider having a mammography (breast X-ray test) every year. Women who have a family history of breast cancer should talk to their caregiver about genetic screening. Women at a high risk of breast cancer should talk to their caregivers  about having magnetic resonance imaging (MRI) and a mammography every year.  The Pap test is a screening test for cervical cancer. A Pap test can show cell changes on the cervix that might become cervical cancer if left untreated. A Pap test is a procedure in which cells are obtained and examined from the lower end of the uterus (cervix).  Women should have a Pap test starting at age 62.  Between ages 49 and 35, Pap tests should be repeated every 2 years.  Beginning at age 51, you should have a Pap test every 3 years as long as the past 3 Pap tests have been normal.  Some women have medical problems that increase the chance of getting cervical cancer. Talk to your caregiver about these problems. It is especially important to talk to your caregiver if a new problem develops soon after your last Pap test. In these cases, your caregiver may recommend more frequent screening and Pap tests.  The above recommendations are the same for women who have or have not gotten the vaccine for human papillomavirus (HPV).  If you had a hysterectomy for a problem that was not cancer or a condition that could lead to cancer, then you no longer need Pap tests. Even if you no longer need a Pap test, a regular exam is a good idea to make sure no other problems are starting.  If you are between ages 52 and 44, and you have had normal Pap tests going back 10  years, you no longer need Pap tests. Even if you no longer need a Pap test, a regular exam is a good idea to make sure no other problems are starting.  If you have had past treatment for cervical cancer or a condition that could lead to cancer, you need Pap tests and screening for cancer for at least 20 years after your treatment.  If Pap tests have been discontinued, risk factors (such as a new sexual partner) need to be reassessed to determine if screening should be resumed.  The HPV test is an additional test that may be used for cervical cancer screening. The  HPV test looks for the virus that can cause the cell changes on the cervix. The cells collected during the Pap test can be tested for HPV. The HPV test could be used to screen women aged 94 years and older, and should be used in women of any age who have unclear Pap test results. After the age of 86, women should have HPV testing at the same frequency as a Pap test.  Colorectal cancer can be detected and often prevented. Most routine colorectal cancer screening begins at the age of 22 and continues through age 62. However, your caregiver may recommend screening at an earlier age if you have risk factors for colon cancer. On a yearly basis, your caregiver may provide home test kits to check for hidden blood in the stool. Use of a small camera at the end of a tube, to directly examine the colon (sigmoidoscopy or colonoscopy), can detect the earliest forms of colorectal cancer. Talk to your caregiver about this at age 1, when routine screening begins. Direct examination of the colon should be repeated every 5 to 10 years through age 85, unless early forms of pre-cancerous polyps or small growths are found.  Hepatitis C blood testing is recommended for all people born from [birth year] through 1965 and any individual with known risks for hepatitis C.  Practice safe sex. Use condoms and avoid high-risk sexual practices to reduce the spread of sexually transmitted infections (STIs). STIs include gonorrhea, chlamydia, syphilis, trichomonas, herpes, HPV, and human immunodeficiency virus (HIV). Herpes, HIV, and HPV are viral illnesses that have no cure. They can result in disability, cancer, and death. Sexually active women aged 37 and younger should be checked for chlamydia. Older women with new or multiple partners should also be tested for chlamydia. Testing for other STIs is recommended if you are sexually active and at increased risk.  Osteoporosis is a disease in which the bones lose minerals and strength with aging.  This can result in serious bone fractures. The risk of osteoporosis can be identified using a bone density scan. Women ages 36 and over and women at risk for fractures or osteoporosis should discuss screening with their caregivers. Ask your caregiver whether you should take a calcium supplement or vitamin D to reduce the rate of osteoporosis.  Menopause can be associated with physical symptoms and risks. Hormone replacement therapy is available to decrease symptoms and risks. You should talk to your caregiver about whether hormone replacement therapy is right for you.  Use sunscreen with sun protection factor (SPF) of 30 or more. Apply sunscreen liberally and repeatedly throughout the day. You should seek shade when your shadow is shorter than you. Protect yourself by wearing long sleeves, pants, a wide-brimmed hat, and sunglasses year round, whenever you are outdoors.  Once a month, do a whole body skin exam, using a mirror to look  at the skin on your back. Notify your caregiver of new moles, moles that have irregular borders, moles that are larger than a pencil eraser, or moles that have changed in shape or color.  Stay current with required immunizations.  Influenza. You need a dose every fall (or winter). The composition of the flu vaccine changes each year, so being vaccinated once is not enough.  Pneumococcal polysaccharide. You need 1 to 2 doses if you smoke cigarettes or if you have certain chronic medical conditions. You need 1 dose at age 41 (or older) if you have never been vaccinated.  Tetanus, diphtheria, pertussis (Tdap, Td). Get 1 dose of Tdap vaccine if you are younger than age 55, are over 46 and have contact with an infant, are a Research scientist (physical sciences), are pregnant, or simply want to be protected from whooping cough. After that, you need a Td booster dose every 10 years. Consult your caregiver if you have not had at least 3 tetanus and diphtheria-containing shots sometime in your life or  have a deep or dirty wound.  HPV. You need this vaccine if you are a woman age 66 or younger. The vaccine is given in 3 doses over 6 months.  Measles, mumps, rubella (MMR). You need at least 1 dose of MMR if you were born in 1957 or later. You may also need a second dose.  Meningococcal. If you are age 4 to 74 and a first-year college student living in a residence hall, or have one of several medical conditions, you need to get vaccinated against meningococcal disease. You may also need additional booster doses.  Zoster (shingles). If you are age 48 or older, you should get this vaccine.  Varicella (chickenpox). If you have never had chickenpox or you were vaccinated but received only 1 dose, talk to your caregiver to find out if you need this vaccine.  Hepatitis A. You need this vaccine if you have a specific risk factor for hepatitis A virus infection or you simply wish to be protected from this disease. The vaccine is usually given as 2 doses, 6 to 18 months apart.  Hepatitis B. You need this vaccine if you have a specific risk factor for hepatitis B virus infection or you simply wish to be protected from this disease. The vaccine is given in 3 doses, usually over 6 months. Preventive Services / Frequency Ages 81 and over  Blood pressure check.** / Every 1 to 2 years.  Lipid and cholesterol check.** / Every 5 years beginning at age 76.  Clinical breast exam.** / Every year after age 74.  Mammogram.** / Every year beginning at age 48 and continuing for as long as you are in good health. Consult with your caregiver.  Pap test.** / Every 3 years starting at age 66 through age 50 or 61 with a 3 consecutive normal Pap tests. Testing can be stopped between 65 and 70 with 3 consecutive normal Pap tests and no abnormal Pap or HPV tests in the past 10 years.  HPV screening.** / Every 3 years from ages 36 through ages 49 or 59 with a history of 3 consecutive normal Pap tests. Testing can be  stopped between 65 and 70 with 3 consecutive normal Pap tests and no abnormal Pap or HPV tests in the past 10 years.  Fecal occult blood test (FOBT) of stool. / Every year beginning at age 42 and continuing until age 70. You may not need to do this test if you get  a colonoscopy every 10 years.  Flexible sigmoidoscopy or colonoscopy.** / Every 5 years for a flexible sigmoidoscopy or every 10 years for a colonoscopy beginning at age 2 and continuing until age 63.  Hepatitis C blood test.** / For all people born from [birth year] through 1964 and any individual with known risks for hepatitis C.  Osteoporosis screening.** / A one-time screening for women ages 93 and over and women at risk for fractures or osteoporosis.  Skin self-exam. / Monthly.  Influenza immunization.** / Every year.  Pneumococcal polysaccharide immunization.** / 1 dose at age 3 (or older) if you have never been vaccinated.  Tetanus, diphtheria, pertussis (Tdap, Td) immunization. / A one-time dose of Tdap vaccine if you are over 65 and have contact with an infant, are a Research scientist (physical sciences), or simply want to be protected from whooping cough. After that, you need a Td booster dose every 10 years.  Varicella immunization.** / Consult your caregiver.  Meningococcal immunization.** / Consult your caregiver.  Hepatitis A immunization.** / Consult your caregiver. 2 doses, 6 to 18 months apart.  Hepatitis B immunization.** / Check with your caregiver. 3 doses, usually over 6 months. ** Family history and personal history of risk and conditions may change your caregiver's recommendations. Document Released: 08/09/2001 Document Revised: 09/05/2011 Document Reviewed: 11/08/2010 [facility] Patient Information 2013 [place], [place].     Neta Mends. Panosh M.D.  Health Maintenance  Topic Date Due  . Influenza Vaccine  02/25/2013  . Colonoscopy  06/27/2014  . Tetanus/tdap  12/26/2016  . Pneumococcal Polysaccharide Vaccine Age 60 And Over   Completed  . Zostavax  Completed

## 2012-07-16 NOTE — Patient Instructions (Signed)
Continue lifestyle intervention healthy eating and exercise . Will notify you  of labs when available. If ok then yearly check   Preventive Care for Adults, Female A healthy lifestyle and preventive care can promote health and wellness. Preventive health guidelines for women include the following key practices.  A routine yearly physical is a good way to check with your caregiver about your health and preventive screening. It is a chance to share any concerns and updates on your health, and to receive a thorough exam.  Visit your dentist for a routine exam and preventive care every 6 months. Brush your teeth twice a day and floss once a day. Good oral hygiene prevents tooth decay and gum disease.  The frequency of eye exams is based on your age, health, family medical history, use of contact lenses, and other factors. Follow your caregiver's recommendations for frequency of eye exams.  Eat a healthy diet. Foods like vegetables, fruits, whole grains, low-fat dairy products, and lean protein foods contain the nutrients you need without too many calories. Decrease your intake of foods high in solid fats, added sugars, and salt. Eat the right amount of calories for you.Get information about a proper diet from your caregiver, if necessary.  Regular physical exercise is one of the most important things you can do for your health. Most adults should get at least 150 minutes of moderate-intensity exercise (any activity that increases your heart rate and causes you to sweat) each week. In addition, most adults need muscle-strengthening exercises on 2 or more days a week.  Maintain a healthy weight. The body mass index (BMI) is a screening tool to identify possible weight problems. It provides an estimate of body fat based on height and weight. Your caregiver can help determine your BMI, and can help you achieve or maintain a healthy weight.For adults 20 years and older:  A BMI below 18.5 is considered  underweight.  A BMI of 18.5 to 24.9 is normal.  A BMI of 25 to 29.9 is considered overweight.  A BMI of 30 and above is considered obese.  Maintain normal blood lipids and cholesterol levels by exercising and minimizing your intake of saturated fat. Eat a balanced diet with plenty of fruit and vegetables. Blood tests for lipids and cholesterol should begin at age 72 and be repeated every 5 years. If your lipid or cholesterol levels are high, you are over 50, or you are at high risk for heart disease, you may need your cholesterol levels checked more frequently.Ongoing high lipid and cholesterol levels should be treated with medicines if diet and exercise are not effective.  If you smoke, find out from your caregiver how to quit. If you do not use tobacco, do not start.  If you are pregnant, do not drink alcohol. If you are breastfeeding, be very cautious about drinking alcohol. If you are not pregnant and choose to drink alcohol, do not exceed 1 drink per day. One drink is considered to be 12 ounces (355 mL) of beer, 5 ounces (148 mL) of wine, or 1.5 ounces (44 mL) of liquor.  Avoid use of street drugs. Do not share needles with anyone. Ask for help if you need support or instructions about stopping the use of drugs.  High blood pressure causes heart disease and increases the risk of stroke. Your blood pressure should be checked at least every 1 to 2 years. Ongoing high blood pressure should be treated with medicines if weight loss and exercise are not  effective.  If you are 9 to 71 years old, ask your caregiver if you should take aspirin to prevent strokes.  Diabetes screening involves taking a blood sample to check your fasting blood sugar level. This should be done once every 3 years, after age 44, if you are within normal weight and without risk factors for diabetes. Testing should be considered at a younger age or be carried out more frequently if you are overweight and have at least 1  risk factor for diabetes.  Breast cancer screening is essential preventive care for women. You should practice "breast self-awareness." This means understanding the normal appearance and feel of your breasts and may include breast self-examination. Any changes detected, no matter how small, should be reported to a caregiver. Women in their 42s and 30s should have a clinical breast exam (CBE) by a caregiver as part of a regular health exam every 1 to 3 years. After age 27, women should have a CBE every year. Starting at age 53, women should consider having a mammography (breast X-ray test) every year. Women who have a family history of breast cancer should talk to their caregiver about genetic screening. Women at a high risk of breast cancer should talk to their caregivers about having magnetic resonance imaging (MRI) and a mammography every year.  The Pap test is a screening test for cervical cancer. A Pap test can show cell changes on the cervix that might become cervical cancer if left untreated. A Pap test is a procedure in which cells are obtained and examined from the lower end of the uterus (cervix).  Women should have a Pap test starting at age 35.  Between ages 102 and 61, Pap tests should be repeated every 2 years.  Beginning at age 48, you should have a Pap test every 3 years as long as the past 3 Pap tests have been normal.  Some women have medical problems that increase the chance of getting cervical cancer. Talk to your caregiver about these problems. It is especially important to talk to your caregiver if a new problem develops soon after your last Pap test. In these cases, your caregiver may recommend more frequent screening and Pap tests.  The above recommendations are the same for women who have or have not gotten the vaccine for human papillomavirus (HPV).  If you had a hysterectomy for a problem that was not cancer or a condition that could lead to cancer, then you no longer need  Pap tests. Even if you no longer need a Pap test, a regular exam is a good idea to make sure no other problems are starting.  If you are between ages 65 and 65, and you have had normal Pap tests going back 10 years, you no longer need Pap tests. Even if you no longer need a Pap test, a regular exam is a good idea to make sure no other problems are starting.  If you have had past treatment for cervical cancer or a condition that could lead to cancer, you need Pap tests and screening for cancer for at least 20 years after your treatment.  If Pap tests have been discontinued, risk factors (such as a new sexual partner) need to be reassessed to determine if screening should be resumed.  The HPV test is an additional test that may be used for cervical cancer screening. The HPV test looks for the virus that can cause the cell changes on the cervix. The cells collected during the  Pap test can be tested for HPV. The HPV test could be used to screen women aged 35 years and older, and should be used in women of any age who have unclear Pap test results. After the age of 64, women should have HPV testing at the same frequency as a Pap test.  Colorectal cancer can be detected and often prevented. Most routine colorectal cancer screening begins at the age of 46 and continues through age 64. However, your caregiver may recommend screening at an earlier age if you have risk factors for colon cancer. On a yearly basis, your caregiver may provide home test kits to check for hidden blood in the stool. Use of a small camera at the end of a tube, to directly examine the colon (sigmoidoscopy or colonoscopy), can detect the earliest forms of colorectal cancer. Talk to your caregiver about this at age 71, when routine screening begins. Direct examination of the colon should be repeated every 5 to 10 years through age 18, unless early forms of pre-cancerous polyps or small growths are found.  Hepatitis C blood testing is  recommended for all people born from [birth year] through 1964 and any individual with known risks for hepatitis C.  Practice safe sex. Use condoms and avoid high-risk sexual practices to reduce the spread of sexually transmitted infections (STIs). STIs include gonorrhea, chlamydia, syphilis, trichomonas, herpes, HPV, and human immunodeficiency virus (HIV). Herpes, HIV, and HPV are viral illnesses that have no cure. They can result in disability, cancer, and death. Sexually active women aged 51 and younger should be checked for chlamydia. Older women with new or multiple partners should also be tested for chlamydia. Testing for other STIs is recommended if you are sexually active and at increased risk.  Osteoporosis is a disease in which the bones lose minerals and strength with aging. This can result in serious bone fractures. The risk of osteoporosis can be identified using a bone density scan. Women ages 63 and over and women at risk for fractures or osteoporosis should discuss screening with their caregivers. Ask your caregiver whether you should take a calcium supplement or vitamin D to reduce the rate of osteoporosis.  Menopause can be associated with physical symptoms and risks. Hormone replacement therapy is available to decrease symptoms and risks. You should talk to your caregiver about whether hormone replacement therapy is right for you.  Use sunscreen with sun protection factor (SPF) of 30 or more. Apply sunscreen liberally and repeatedly throughout the day. You should seek shade when your shadow is shorter than you. Protect yourself by wearing long sleeves, pants, a wide-brimmed hat, and sunglasses year round, whenever you are outdoors.  Once a month, do a whole body skin exam, using a mirror to look at the skin on your back. Notify your caregiver of new moles, moles that have irregular borders, moles that are larger than a pencil eraser, or moles that have changed in shape or color.  Stay current  with required immunizations.  Influenza. You need a dose every fall (or winter). The composition of the flu vaccine changes each year, so being vaccinated once is not enough.  Pneumococcal polysaccharide. You need 1 to 2 doses if you smoke cigarettes or if you have certain chronic medical conditions. You need 1 dose at age 11 (or older) if you have never been vaccinated.  Tetanus, diphtheria, pertussis (Tdap, Td). Get 1 dose of Tdap vaccine if you are younger than age 53, are over 47 and have contact with  an infant, are a Research scientist (physical sciences), are pregnant, or simply want to be protected from whooping cough. After that, you need a Td booster dose every 10 years. Consult your caregiver if you have not had at least 3 tetanus and diphtheria-containing shots sometime in your life or have a deep or dirty wound.  HPV. You need this vaccine if you are a woman age 70 or younger. The vaccine is given in 3 doses over 6 months.  Measles, mumps, rubella (MMR). You need at least 1 dose of MMR if you were born in 1957 or later. You may also need a second dose.  Meningococcal. If you are age 41 to 38 and a first-year college student living in a residence hall, or have one of several medical conditions, you need to get vaccinated against meningococcal disease. You may also need additional booster doses.  Zoster (shingles). If you are age 33 or older, you should get this vaccine.  Varicella (chickenpox). If you have never had chickenpox or you were vaccinated but received only 1 dose, talk to your caregiver to find out if you need this vaccine.  Hepatitis A. You need this vaccine if you have a specific risk factor for hepatitis A virus infection or you simply wish to be protected from this disease. The vaccine is usually given as 2 doses, 6 to 18 months apart.  Hepatitis B. You need this vaccine if you have a specific risk factor for hepatitis B virus infection or you simply wish to be protected from this disease.  The vaccine is given in 3 doses, usually over 6 months. Preventive Services / Frequency Ages 74 and over  Blood pressure check.** / Every 1 to 2 years.  Lipid and cholesterol check.** / Every 5 years beginning at age 81.  Clinical breast exam.** / Every year after age 26.  Mammogram.** / Every year beginning at age 37 and continuing for as long as you are in good health. Consult with your caregiver.  Pap test.** / Every 3 years starting at age 63 through age 50 or 46 with a 3 consecutive normal Pap tests. Testing can be stopped between 65 and 70 with 3 consecutive normal Pap tests and no abnormal Pap or HPV tests in the past 10 years.  HPV screening.** / Every 3 years from ages 65 through ages 33 or 25 with a history of 3 consecutive normal Pap tests. Testing can be stopped between 65 and 70 with 3 consecutive normal Pap tests and no abnormal Pap or HPV tests in the past 10 years.  Fecal occult blood test (FOBT) of stool. / Every year beginning at age 97 and continuing until age 11. You may not need to do this test if you get a colonoscopy every 10 years.  Flexible sigmoidoscopy or colonoscopy.** / Every 5 years for a flexible sigmoidoscopy or every 10 years for a colonoscopy beginning at age 43 and continuing until age 97.  Hepatitis C blood test.** / For all people born from [birth year] through 1964 and any individual with known risks for hepatitis C.  Osteoporosis screening.** / A one-time screening for women ages 45 and over and women at risk for fractures or osteoporosis.  Skin self-exam. / Monthly.  Influenza immunization.** / Every year.  Pneumococcal polysaccharide immunization.** / 1 dose at age 14 (or older) if you have never been vaccinated.  Tetanus, diphtheria, pertussis (Tdap, Td) immunization. / A one-time dose of Tdap vaccine if you are over 65 and have contact  with an infant, are a Research scientist (physical sciences), or simply want to be protected from whooping cough. After that, you need a Td  booster dose every 10 years.  Varicella immunization.** / Consult your caregiver.  Meningococcal immunization.** / Consult your caregiver.  Hepatitis A immunization.** / Consult your caregiver. 2 doses, 6 to 18 months apart.  Hepatitis B immunization.** / Check with your caregiver. 3 doses, usually over 6 months. ** Family history and personal history of risk and conditions may change your caregiver's recommendations. Document Released: 08/09/2001 Document Revised: 09/05/2011 Document Reviewed: 11/08/2010 [facility] Patient Information 2013 [place], [place].

## 2012-07-17 LAB — VITAMIN D 25 HYDROXY (VIT D DEFICIENCY, FRACTURES): Vit D, 25-Hydroxy: 44 ng/mL (ref 30–89)

## 2012-07-19 DIAGNOSIS — Z1231 Encounter for screening mammogram for malignant neoplasm of breast: Secondary | ICD-10-CM | POA: Diagnosis not present

## 2012-07-19 LAB — HM MAMMOGRAPHY

## 2012-09-29 ENCOUNTER — Other Ambulatory Visit: Payer: Self-pay | Admitting: Internal Medicine

## 2012-10-03 ENCOUNTER — Other Ambulatory Visit: Payer: Self-pay | Admitting: Family Medicine

## 2012-10-03 MED ORDER — LEVOTHYROXINE SODIUM 100 MCG PO TABS: 100.0000 ug | ORAL_TABLET | Freq: Every day | ORAL | Status: DC

## 2012-10-20 ENCOUNTER — Encounter: Payer: Self-pay | Admitting: Internal Medicine

## 2012-10-20 ENCOUNTER — Ambulatory Visit (INDEPENDENT_AMBULATORY_CARE_PROVIDER_SITE_OTHER): Payer: Medicare Other | Admitting: Internal Medicine

## 2012-10-20 VITALS — BP 110/70 | HR 70 | Temp 98.0°F | Wt 160.0 lb

## 2012-10-20 DIAGNOSIS — R109 Unspecified abdominal pain: Secondary | ICD-10-CM | POA: Diagnosis not present

## 2012-10-20 DIAGNOSIS — R350 Frequency of micturition: Secondary | ICD-10-CM

## 2012-10-20 LAB — POCT URINALYSIS DIPSTICK
Bilirubin, UA: NEGATIVE
Glucose, UA: NEGATIVE
Leukocytes, UA: NEGATIVE
Nitrite, UA: NEGATIVE
Urobilinogen, UA: 0.2
pH, UA: 6

## 2012-10-20 MED ORDER — SULFAMETHOXAZOLE-TRIMETHOPRIM 800-160 MG PO TABS: 1.0000 | ORAL_TABLET | Freq: Two times a day (BID) | ORAL | Status: DC

## 2012-10-20 NOTE — Patient Instructions (Signed)
Urine is pretty  Good  today but if gets  Burning  and  Worsening .  Can take antibiotic .

## 2012-10-20 NOTE — Progress Notes (Signed)
Chief Complaint  Patient presents with  . Urinary Frequency    HPI: Patient comes in today for SDA for  new problem evaluation. Saturday clinicn increasing urinary frequency without fever some lbp but thinks could be from exercise program no fever feeling bad. But husband having surgery at [facility] on Monday and wants to make sure doesn't have infection, ROS: See pertinent positives and negatives per HPI. Last uti  a long time ago   Past Medical History  Diagnosis Date  . Hypothyroidism     post thyroidectomy  . Osteopenia     t -2.5 hip in 2005 by [facility]  . Osteoporosis     commpr fx after dog related fall  injury  . Seasonal rhinitis     spring and fall  . Skin cancer     squamous cell ca chest  . Elevated lipids   . Vaginal prolapse     hs corrected    Family History  Problem Relation Age of Onset  . Heart attack Mother   . Alcohol abuse Mother   . Alcohol abuse Other   . Kidney disease Other   . Cancer Other     kidne  . Heart disease Other     History   Social History  . Marital Status: Married    Spouse Name: N/A    Number of Children: N/A  . Years of Education: N/A   Social History Main Topics  . Smoking status: Never Smoker   . Smokeless tobacco: None  . Alcohol Use: Yes  . Drug Use: No  . Sexually Active: None   Other Topics Concern  . 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 Calcium Carbonate (CALTRATE 600) 1500 MG TABS Take by mouth.        . cholecalciferol (VITAMIN D) 1000 UNITS tablet Take 1,000 Units by mouth daily.        Marland Kitchen estradiol-norethindrone (COMBIPATCH) 0.05-0.14 MG/DAY Place 1 patch onto the skin 2 (two) times a week.        Marland Kitchen LONGS B COMPLEX - C PO Take by mouth.        .  risedronate (ACTONEL) 150 MG tablet Take 150 mg by mouth every 30 (thirty) days. with water on empty stomach, nothing by mouth or lie down for next 30 minutes.       Marland Kitchen SYNTHROID 100 MCG tablet TAKE 1 TABLET BY MOUTH DAILY  30 tablet  8  . sulfamethoxazole-trimethoprim (SEPTRA DS) 800-160 MG per tablet Take 1 tablet by mouth 2 (two) times daily.  6 tablet  0  . [DISCONTINUED] levothyroxine (SYNTHROID) 100 MCG tablet Take 1 tablet (100 mcg total) by mouth daily.  90 tablet  2   No facility-administered encounter medications on file as of 10/20/2012.    EXAM:  BP 110/70  Pulse 70  Temp(Src) 98 F (36.7 C) (Oral)  Wt 160 lb (72.576 kg)  BMI 29.98 kg/m2  SpO2 97%  Body mass index is 29.98 kg/(m^2).  GENERAL: vitals reviewed and listed above, alert, oriented, appears well hydrated and in no acute distress  Neg cva tenderness  Gait steady looks well  MS: moves all extremities without noticeable focal  Abnormality mild kyphosis  PSYCH: pleasant and cooperative, no obvious depression or anxiety ua neg except trc bl ASSESSMENT AND PLAN:  Discussed the following assessment and plan:  Urine frequency - if sx progress can tx empirically with antibiotic otherwise  asrecheck when due prn  - Plan: POCT urinalysis dipstick  Abdominal pressure - Plan: POCT urinalysis dipstick  -Patient advised to return or notify health care team  if symptoms worsen or persist or new concerns arise.  Patient Instructions  Urine is pretty  Good  today but if gets  Burning  and  Worsening .  Can take antibiotic .     Neta Mends. Panosh M.D.

## 2012-12-07 ENCOUNTER — Emergency Department (HOSPITAL_COMMUNITY)
Admission: EM | Admit: 2012-12-07 | Discharge: 2012-12-07 | Disposition: A | Discharge status: Home / Self Care | Payer: Medicare Other | Attending: Emergency Medicine | Admitting: Emergency Medicine

## 2012-12-07 ENCOUNTER — Emergency Department (HOSPITAL_COMMUNITY): Payer: Medicare Other

## 2012-12-07 DIAGNOSIS — Z79899 Other long term (current) drug therapy: Secondary | ICD-10-CM | POA: Diagnosis not present

## 2012-12-07 DIAGNOSIS — S99929A Unspecified injury of unspecified foot, initial encounter: Secondary | ICD-10-CM | POA: Diagnosis not present

## 2012-12-07 DIAGNOSIS — M25569 Pain in unspecified knee: Secondary | ICD-10-CM | POA: Diagnosis not present

## 2012-12-07 DIAGNOSIS — Z8709 Personal history of other diseases of the respiratory system: Secondary | ICD-10-CM | POA: Insufficient documentation

## 2012-12-07 DIAGNOSIS — S99919A Unspecified injury of unspecified ankle, initial encounter: Secondary | ICD-10-CM | POA: Diagnosis not present

## 2012-12-07 DIAGNOSIS — M810 Age-related osteoporosis without current pathological fracture: Secondary | ICD-10-CM | POA: Diagnosis not present

## 2012-12-07 DIAGNOSIS — Z87828 Personal history of other (healed) physical injury and trauma: Secondary | ICD-10-CM | POA: Diagnosis not present

## 2012-12-07 DIAGNOSIS — Y9239 Other specified sports and athletic area as the place of occurrence of the external cause: Secondary | ICD-10-CM | POA: Insufficient documentation

## 2012-12-07 DIAGNOSIS — S8990XA Unspecified injury of unspecified lower leg, initial encounter: Secondary | ICD-10-CM | POA: Diagnosis not present

## 2012-12-07 DIAGNOSIS — Z8742 Personal history of other diseases of the female genital tract: Secondary | ICD-10-CM | POA: Insufficient documentation

## 2012-12-07 DIAGNOSIS — M25561 Pain in right knee: Secondary | ICD-10-CM

## 2012-12-07 DIAGNOSIS — Z85828 Personal history of other malignant neoplasm of skin: Secondary | ICD-10-CM | POA: Insufficient documentation

## 2012-12-07 DIAGNOSIS — E039 Hypothyroidism, unspecified: Secondary | ICD-10-CM | POA: Insufficient documentation

## 2012-12-07 DIAGNOSIS — M899 Disorder of bone, unspecified: Secondary | ICD-10-CM | POA: Diagnosis not present

## 2012-12-07 DIAGNOSIS — Y9389 Activity, other specified: Secondary | ICD-10-CM | POA: Insufficient documentation

## 2012-12-07 DIAGNOSIS — W010XXA Fall on same level from slipping, tripping and stumbling without subsequent striking against object, initial encounter: Secondary | ICD-10-CM | POA: Insufficient documentation

## 2012-12-07 DIAGNOSIS — M818 Other osteoporosis without current pathological fracture: Secondary | ICD-10-CM | POA: Diagnosis not present

## 2012-12-07 NOTE — ED Notes (Signed)
Pt states she tripped on a rug at the gym and hurt her R knee. Pt states she has already injured that same knee. Pt states the knee is more swollen now. Pt ambulatory to exam room with steady gait. Pt states she took Tylenol and ASA for pain today.

## 2012-12-07 NOTE — ED Provider Notes (Signed)
History    This chart was scribed for [name] [name], non-physician practitioner working with Vida Roller, MD by Leone Payor, ED Scribe. This patient was seen in room WTR9/WTR9 and the patient's care was started at 2006.   CSN: [id number]  Arrival date & time 12/07/12  2006   First MD Initiated Contact with Patient 12/07/12 2051      Chief Complaint  Patient presents with  . Knee Pain    The history is provided by the patient. No language interpreter was used.    HPI Comments: [name] [name] is a 72 y.o. female with h/o osteopenia and osteoporosis who presents to the Emergency Department complaining of constant R lateral and posterior knee pain starting this morning. States she tripped on a rug when the pain began. Reports having a previous injury to the same knee about 1 year ago. States she is able to bear weight but she is having a limp when walking. She is comfortable walking but cannot walk as fast. She has also noticed swelling in the last few hours. She has taken tylenol and aspirin today with mild relief. She denies numbness or tingling, weakness.   Past Medical History  Diagnosis Date  . Hypothyroidism     post thyroidectomy  . Osteopenia     t -2.5 hip in 2005 by Dexa  . Osteoporosis     commpr fx after dog related fall  injury  . Seasonal rhinitis     spring and fall  . Skin cancer     squamous cell ca chest  . Elevated lipids   . Vaginal prolapse     hs corrected    Past Surgical History  Procedure Laterality Date  . Thyroidectomy    . Colonoscopy  2006  . Abdominal hysterectomy  07/2007  . Bladders sling surgery  07/2007    Family History  Problem Relation Age of Onset  . Heart attack Mother   . Alcohol abuse Mother   . Alcohol abuse Other   . Kidney disease Other   . Cancer Other     kidne  . Heart disease Other     History  Substance Use Topics  . Smoking status: Never Smoker   . Smokeless tobacco: Not on file  . Alcohol Use: Yes    OB  History   Grav Para Term Preterm Abortions TAB SAB Ect Mult Living   1 1              Review of Systems  HENT: Negative for neck pain and neck stiffness.   Respiratory: Negative for cough, chest tightness and shortness of breath.   Cardiovascular: Negative for chest pain.  Musculoskeletal: Positive for joint swelling and arthralgias.  Neurological: Negative for dizziness, weakness, numbness and headaches.  All other systems reviewed and are negative.    Allergies  Review of patient's allergies indicates no known allergies.  Home Medications   Current Outpatient Rx  Name  Route  Sig  Dispense  Refill  . B Complex-C (B-COMPLEX WITH VITAMIN C) tablet   Oral   Take 1 tablet by mouth every morning.         . Calcium Carbonate (CALTRATE 600) 1500 MG TABS   Oral   Take 1 tablet by mouth every morning.          . cholecalciferol (VITAMIN D) 1000 UNITS tablet   Oral   Take 1,000 Units by mouth every morning.          Marland Kitchen  estradiol-norethindrone (COMBIPATCH) 0.05-0.14 MG/DAY   Transdermal   Place 1 patch onto the skin 2 (two) times a week.           . levothyroxine (SYNTHROID, LEVOTHROID) 100 MCG tablet   Oral   Take 100 mcg by mouth daily before breakfast.         . risedronate (ACTONEL) 150 MG tablet   Oral   Take 150 mg by mouth every 30 (thirty) days. with water on empty stomach, nothing by mouth or lie down for next 30 minutes.            BP 131/67  Pulse 74  Temp(Src) 98.5 F (36.9 C) (Oral)  Resp 16  SpO2 97%  Physical Exam  Nursing note and vitals reviewed. Constitutional: She is oriented to person, place, and time. She appears well-developed and well-nourished. No distress.  HENT:  Head: Normocephalic and atraumatic.  Eyes: Conjunctivae and EOM are normal. Pupils are equal, round, and reactive to light.  Neck: Normal range of motion. Neck supple.  Cardiovascular: Normal rate, regular rhythm and normal heart sounds.  Exam reveals no friction rub.    No murmur heard. Pulses:      Radial pulses are 2+ on the right side, and 2+ on the left side.       Dorsalis pedis pulses are 2+ on the right side, and 2+ on the left side.  Pulmonary/Chest: Effort normal and breath sounds normal. No respiratory distress. She has no wheezes. She has no rales.  Musculoskeletal: Normal range of motion.       Right knee: She exhibits swelling. She exhibits normal range of motion, no ecchymosis, no deformity, no laceration, no erythema and normal alignment. Tenderness found. Medial joint line and MCL tenderness noted. No lateral joint line and no LCL tenderness noted.       Legs: Mild tenderness upon palpation to the right medial aspect and posterior aspect of right knee Mild swelling noted to the right knee Negative erythema, inflammation, warmth to touch, effusion noted to the right knee  Full ROM to the right knee Strength 5+/5+ to lower extremities, bilaterally  Neurological: She is alert and oriented to person, place, and time. She exhibits normal muscle tone. Coordination normal.  Gait proper, proper balance  Skin: Skin is warm and dry. No erythema.  Psychiatric: She has a normal mood and affect. Her behavior is normal.    ED Course  Procedures (including critical care time)  DIAGNOSTIC STUDIES: Oxygen Saturation is 97% on RA, adequate by my interpretation.    COORDINATION OF CARE: 9:58 PM Discussed treatment plan with pt at bedside and pt agreed to plan.   Labs Reviewed - No data to display Dg Knee Complete 4 Views Right  12/07/2012   *RADIOLOGY REPORT*  Clinical Data: Pain post fall  RIGHT KNEE - COMPLETE 4+ VIEW  Comparison: None.  Findings: Four views of the right knee submitted.  Diffuse osteopenia.  No acute fracture or subluxation.  There is spurring of the lateral femoral condyle.  Prepatellar soft tissue swelling. Narrowing of patellofemoral joint space.  Mild spurring of the lateral tibial plateau.  IMPRESSION: No acute fracture or  subluxation.  Diffuse osteopenia. Degenerative changes as described above.   Original Report Authenticated By: Natasha Mead, M.D.     1. Right knee pain   2. Osteoporosis       MDM  I personally performed the services described in this documentation, which was scribed in my presence. The recorded information  has been reviewed and is accurate.   Patient presenting with right knee pain since fall on carpet this morning - patient reported having issues with knee in the past - is followed by Dr. April Manson. Mild swelling noted - negative erythema, inflammation, warmth to touch - doubt septic joint. Full ROM. No neurological deficits. Negative findings on imaging. Patient stable, afebrile. Patient discharged. Patient placed in knee sleeve. Referred patient to orthopedics. Discussed with patient care of joint with elevation and rest and ice. Discussed with patient to monitor symptoms and if symptoms are to worsen or change to report back to the ED - strict return instructions given. Patient agreed to plan of care, understood, all questions answered.    [name] Mutton, PA-C 12/08/12 [phone]

## 2012-12-08 NOTE — ED Provider Notes (Signed)
Medical screening examination/treatment/procedure(s) were performed by non-physician practitioner and as supervising physician I was immediately available for consultation/collaboration.    Vida Roller, MD 12/08/12 [phone]

## 2012-12-19 DIAGNOSIS — Z124 Encounter for screening for malignant neoplasm of cervix: Secondary | ICD-10-CM | POA: Diagnosis not present

## 2012-12-19 DIAGNOSIS — Z01419 Encounter for gynecological examination (general) (routine) without abnormal findings: Secondary | ICD-10-CM | POA: Diagnosis not present

## 2012-12-21 DIAGNOSIS — M1710 Unilateral primary osteoarthritis, unspecified knee: Secondary | ICD-10-CM | POA: Diagnosis not present

## 2012-12-21 DIAGNOSIS — M25569 Pain in unspecified knee: Secondary | ICD-10-CM | POA: Diagnosis not present

## 2013-01-30 DIAGNOSIS — D1801 Hemangioma of skin and subcutaneous tissue: Secondary | ICD-10-CM | POA: Diagnosis not present

## 2013-01-30 DIAGNOSIS — D485 Neoplasm of uncertain behavior of skin: Secondary | ICD-10-CM | POA: Diagnosis not present

## 2013-01-30 DIAGNOSIS — Z85828 Personal history of other malignant neoplasm of skin: Secondary | ICD-10-CM | POA: Diagnosis not present

## 2013-01-30 DIAGNOSIS — L821 Other seborrheic keratosis: Secondary | ICD-10-CM | POA: Diagnosis not present

## 2013-01-30 DIAGNOSIS — L570 Actinic keratosis: Secondary | ICD-10-CM | POA: Diagnosis not present

## 2013-01-30 DIAGNOSIS — D239 Other benign neoplasm of skin, unspecified: Secondary | ICD-10-CM | POA: Diagnosis not present

## 2013-01-30 DIAGNOSIS — C4492 Squamous cell carcinoma of skin, unspecified: Secondary | ICD-10-CM | POA: Diagnosis not present

## 2013-02-06 DIAGNOSIS — H02409 Unspecified ptosis of unspecified eyelid: Secondary | ICD-10-CM | POA: Diagnosis not present

## 2013-02-06 DIAGNOSIS — H2510 Age-related nuclear cataract, unspecified eye: Secondary | ICD-10-CM | POA: Diagnosis not present

## 2013-02-06 DIAGNOSIS — D3130 Benign neoplasm of unspecified choroid: Secondary | ICD-10-CM | POA: Diagnosis not present

## 2013-02-21 DIAGNOSIS — L905 Scar conditions and fibrosis of skin: Secondary | ICD-10-CM | POA: Diagnosis not present

## 2013-02-21 DIAGNOSIS — C44621 Squamous cell carcinoma of skin of unspecified upper limb, including shoulder: Secondary | ICD-10-CM | POA: Diagnosis not present

## 2013-03-15 ENCOUNTER — Other Ambulatory Visit (HOSPITAL_COMMUNITY): Payer: Self-pay | Admitting: Obstetrics & Gynecology

## 2013-03-15 DIAGNOSIS — Z780 Asymptomatic menopausal state: Secondary | ICD-10-CM

## 2013-03-15 DIAGNOSIS — M810 Age-related osteoporosis without current pathological fracture: Secondary | ICD-10-CM

## 2013-03-15 DIAGNOSIS — M8580 Other specified disorders of bone density and structure, unspecified site: Secondary | ICD-10-CM

## 2013-03-19 ENCOUNTER — Ambulatory Visit (HOSPITAL_COMMUNITY)
Admission: RE | Admit: 2013-03-19 | Discharge: 2013-03-19 | Disposition: A | Discharge status: Home / Self Care | Payer: Medicare Other | Source: Ambulatory Visit | Attending: Obstetrics & Gynecology | Admitting: Obstetrics & Gynecology

## 2013-03-19 ENCOUNTER — Other Ambulatory Visit (HOSPITAL_COMMUNITY): Payer: Self-pay | Admitting: Obstetrics & Gynecology

## 2013-03-19 DIAGNOSIS — Z780 Asymptomatic menopausal state: Secondary | ICD-10-CM | POA: Insufficient documentation

## 2013-03-19 DIAGNOSIS — M810 Age-related osteoporosis without current pathological fracture: Secondary | ICD-10-CM

## 2013-03-19 DIAGNOSIS — M8580 Other specified disorders of bone density and structure, unspecified site: Secondary | ICD-10-CM

## 2013-03-19 DIAGNOSIS — Z13820 Encounter for screening for osteoporosis: Secondary | ICD-10-CM | POA: Insufficient documentation

## 2013-04-13 DIAGNOSIS — Z23 Encounter for immunization: Secondary | ICD-10-CM | POA: Diagnosis not present

## 2013-05-02 ENCOUNTER — Other Ambulatory Visit: Payer: Self-pay

## 2013-07-19 ENCOUNTER — Encounter: Payer: Self-pay | Admitting: Internal Medicine

## 2013-07-19 DIAGNOSIS — M719 Bursopathy, unspecified: Secondary | ICD-10-CM | POA: Diagnosis not present

## 2013-07-19 DIAGNOSIS — M67919 Unspecified disorder of synovium and tendon, unspecified shoulder: Secondary | ICD-10-CM | POA: Diagnosis not present

## 2013-07-19 DIAGNOSIS — M1710 Unilateral primary osteoarthritis, unspecified knee: Secondary | ICD-10-CM | POA: Diagnosis not present

## 2013-07-22 DIAGNOSIS — Z1231 Encounter for screening mammogram for malignant neoplasm of breast: Secondary | ICD-10-CM | POA: Diagnosis not present

## 2013-07-22 LAB — HM MAMMOGRAPHY

## 2013-07-25 ENCOUNTER — Encounter: Payer: Self-pay | Admitting: Internal Medicine

## 2013-08-02 DIAGNOSIS — M25819 Other specified joint disorders, unspecified shoulder: Secondary | ICD-10-CM | POA: Diagnosis not present

## 2013-09-04 DIAGNOSIS — Z0000 Encounter for general adult medical examination without abnormal findings: Secondary | ICD-10-CM | POA: Diagnosis not present

## 2013-10-06 ENCOUNTER — Other Ambulatory Visit: Payer: Self-pay | Admitting: Internal Medicine

## 2013-10-15 ENCOUNTER — Other Ambulatory Visit: Payer: Self-pay | Admitting: Internal Medicine

## 2013-10-16 ENCOUNTER — Ambulatory Visit (INDEPENDENT_AMBULATORY_CARE_PROVIDER_SITE_OTHER): Payer: Medicare Other | Admitting: Internal Medicine

## 2013-10-16 ENCOUNTER — Encounter: Payer: Self-pay | Admitting: Internal Medicine

## 2013-10-16 VITALS — BP 124/72 | HR 71 | Temp 98.1°F | Ht 61.0 in | Wt 163.0 lb

## 2013-10-16 DIAGNOSIS — Z79899 Other long term (current) drug therapy: Secondary | ICD-10-CM | POA: Insufficient documentation

## 2013-10-16 DIAGNOSIS — Z0000 Encounter for general adult medical examination without abnormal findings: Secondary | ICD-10-CM

## 2013-10-16 DIAGNOSIS — E039 Hypothyroidism, unspecified: Secondary | ICD-10-CM | POA: Diagnosis not present

## 2013-10-16 DIAGNOSIS — R002 Palpitations: Secondary | ICD-10-CM | POA: Diagnosis not present

## 2013-10-16 DIAGNOSIS — M810 Age-related osteoporosis without current pathological fracture: Secondary | ICD-10-CM | POA: Diagnosis not present

## 2013-10-16 DIAGNOSIS — Z23 Encounter for immunization: Secondary | ICD-10-CM

## 2013-10-16 LAB — CBC WITH DIFFERENTIAL/PLATELET
BASOS ABS: 0 10*3/uL (ref 0.0–0.1)
BASOS PCT: 0.3 % (ref 0.0–3.0)
EOS PCT: 6.4 % — AB (ref 0.0–5.0)
Eosinophils Absolute: 0.4 10*3/uL (ref 0.0–0.7)
HEMATOCRIT: 40.2 % (ref 36.0–46.0)
HEMOGLOBIN: 13.4 g/dL (ref 12.0–15.0)
LYMPHS ABS: 2.3 10*3/uL (ref 0.7–4.0)
LYMPHS PCT: 42.3 % (ref 12.0–46.0)
MCHC: 33.2 g/dL (ref 30.0–36.0)
MCV: 95.5 fl (ref 78.0–100.0)
MONOS PCT: 9.2 % (ref 3.0–12.0)
Monocytes Absolute: 0.5 10*3/uL (ref 0.1–1.0)
NEUTROS ABS: 2.3 10*3/uL (ref 1.4–7.7)
Neutrophils Relative %: 41.8 % — ABNORMAL LOW (ref 43.0–77.0)
Platelets: 258 10*3/uL (ref 150.0–400.0)
RBC: 4.21 Mil/uL (ref 3.87–5.11)
RDW: 13.4 % (ref 11.5–14.6)
WBC: 5.5 10*3/uL (ref 4.5–10.5)

## 2013-10-16 LAB — HEPATIC FUNCTION PANEL
ALBUMIN: 3.9 g/dL (ref 3.5–5.2)
ALK PHOS: 41 U/L (ref 39–117)
ALT: 21 U/L (ref 0–35)
AST: 24 U/L (ref 0–37)
Bilirubin, Direct: 0.1 mg/dL (ref 0.0–0.3)
TOTAL PROTEIN: 6.6 g/dL (ref 6.0–8.3)
Total Bilirubin: 1 mg/dL (ref 0.3–1.2)

## 2013-10-16 LAB — BASIC METABOLIC PANEL
BUN: 14 mg/dL (ref 6–23)
CO2: 26 mEq/L (ref 19–32)
Calcium: 9.3 mg/dL (ref 8.4–10.5)
Chloride: 107 mEq/L (ref 96–112)
Creatinine, Ser: 0.8 mg/dL (ref 0.4–1.2)
GFR: 78.28 mL/min (ref >60.00)
GLUCOSE: 84 mg/dL (ref 70–99)
POTASSIUM: 3.9 meq/L (ref 3.5–5.1)
SODIUM: 140 meq/L (ref 135–145)

## 2013-10-16 LAB — LIPID PANEL
CHOLESTEROL: 185 mg/dL (ref 0–200)
HDL: 56.9 mg/dL (ref >39.00)
LDL Cholesterol: 117 mg/dL — ABNORMAL HIGH (ref 0–99)
Total CHOL/HDL Ratio: 3
Triglycerides: 58 mg/dL (ref 0.0–149.0)
VLDL: 11.6 mg/dL (ref 0.0–40.0)

## 2013-10-16 LAB — TSH: TSH: 0.4 u[IU]/mL (ref 0.35–5.50)

## 2013-10-16 NOTE — Patient Instructions (Signed)
Continue lifestyle intervention healthy eating and exercise . 150 minutes of exercise weeks  ,. Avoid trans fats and processed foods;  Increase fresh fruits and veges to 5 servings per day. And avoid sweet beverages  Including tea and juice. Stool cards for colon cancer screening. Plan yearly visit.  Will notify you  of labs when available.    Bone Health Our bones do many things. They provide structure, protect organs, anchor muscles, and store calcium. Adequate calcium in your diet and weight-bearing physical activity help build strong bones, improve bone amounts, and may reduce the risk of weakening of bones (osteoporosis) later in life. PEAK BONE MASS By age 35, the average woman has acquired most of her skeletal bone mass. A large decline occurs in older adults which increases the risk of osteoporosis. In women this occurs around the time of menopause. It is important for young girls to reach their peak bone mass in order to maintain bone health throughout life. A person with high bone mass as a young adult will be more likely to have a higher bone mass later in life. Not enough calcium consumption and physical activity early on could result in a failure to achieve optimum bone mass in adulthood. OSTEOPOROSIS Osteoporosis is a disease of the bones. It is defined as low bone mass with deterioration of bone structure. Osteoporosis leads to an increase risk of fractures with falls. These fractures commonly happen in the wrist, hip, and spine. While men and women of all ages and background can develop osteoporosis, some of the risk factors for osteoporosis are:  Female.  White.  Postmenopausal.  Older adults.  Small in body size.  Eating a diet low in calcium.  Physically inactive.  Smoking.  Use of some medications.  Family history. CALCIUM Calcium is a mineral needed by the body for healthy bones, teeth, and proper function of the heart, muscles, and nerves. The body cannot  produce calcium so it must be absorbed through food. Good sources of calcium include:  Dairy products (low fat or nonfat milk, cheese, and yogurt).  Dark green leafy vegetables (bok choy and broccoli).  Calcium fortified foods (orange juice, cereal, bread, soy beverages, and tofu products).  Nuts (almonds). Recommended amounts of calcium vary for individuals. RECOMMENDED CALCIUM INTAKES Age and Amount in mg per day  Children 1 to 3 years / 700 mg  Children 4 to 8 years / 1,000 mg  Children 9 to 13 years / 1,300 mg  Teens 14 to 18 years / 1,300 mg  Adults 19 to 50 years / 1,000 mg  Adult women 51 to 70 years / 1,200 mg  Adults 71 years and older / 1,200 mg  Pregnant and breastfeeding teens / 1,300 mg  Pregnant and breastfeeding adults / 1,000 mg Vitamin D also plays an important role in healthy bone development. Vitamin D helps in the absorption of calcium. WEIGHT-BEARING PHYSICAL ACTIVITY Regular physical activity has many positive health benefits. Benefits include strong bones. Weight-bearing physical activity early in life is important in reaching peak bone mass. Weight-bearing physical activities cause muscles and bones to work against gravity. Some examples of weight bearing physical activities include:  Walking, jogging, or running.  [employer].  Jumping rope.  Dancing.  Soccer.  Tennis or Racquetball.  Stair climbing.  Basketball.  Hiking.  Weight lifting.  Aerobic fitness classes. Including weight-bearing physical activity into an exercise plan is a great way to keep bones healthy. Adults: Engage in at least 30 minutes  of moderate physical activity on most, preferably all, days of the week. Children: Engage in at least 60 minutes of moderate physical activity on most, preferably all, days of the week. FOR MORE INFORMATION [place] Web designer, Soil scientist for [employer] and Promotion: www.cnpp.usda.[place]: EquipmentWeekly.com.ee Document Released: 09/03/2003 Document Revised: 10/08/2012 Document Reviewed: 12/03/2008 [facility] Patient Information 2014 [place], [place].

## 2013-10-16 NOTE — Progress Notes (Signed)
Pre visit review using our clinic review tool, if applicable. No additional management support is needed unless otherwise documented below in the visit note.   Chief Complaint  Patient presents with  . Medicare Wellness    HPI: Patient comes in today for Preventive Medicare wellness visit . No major injuries, ed visits ,hospitalizations , new medications since last visit. Thyroid ok   No change in meds on brand   No excess palpitation  ocass  Skipped beat.  Right mesisca;l tear .   doing and walking well now  Hrt.  Ongoing Had life screen  Ok  Prefers stool cards for screen instead of colonoscopy fu. No sx.   Health Maintenance  Topic Date Due  . Influenza Vaccine  01/25/2014  . Colonoscopy  06/27/2014  . Mammogram  07/23/2015  . Tetanus/tdap  12/26/2016  . Pneumococcal Polysaccharide Vaccine Age 37 And Over  Completed  . Zostavax  Completed   Health Maintenance Review   Hearing: ok  Vision:  No limitations at present . Last eye check UTD  Safety:  Has smoke detector and wears seat belts.  No firearms. No excess sun exposure. Sees dentist regularly.  Falls:  no  Advance directive :  Reviewed  Has one.  Memory: Felt to be good  , no concern from her or her family.  Depression: No anhedonia unusual crying or depressive symptoms  Nutrition: Eats well balanced diet; adequate calcium and vitamin D. No swallowing chewing problems.  Injury: no major injuries in the last six months.  Other healthcare providers:  Reviewed today .  Social:  Lives with spouse married.  pets. 2 dog and cat   Preventive parameters: up-to-date  Reviewed   ADLS:   There are no problems or need for assistance  driving, feeding, obtaining food, dressing, toileting and bathing, managing money using phone. She is independent.  EXERCISE/ HABITS  Per week    Working  Technical brewer .   No tobacco    etoh ocass wine    ROS:  GEN/ HEENT: No fever, significant weight changes sweats headaches  vision problems hearing changes, CV/ PULM; No chest pain shortness of breath cough, syncope,edema  change in exercise tolerance. GI /GU: No adominal pain, vomiting, change in bowel habits. No blood in the stool. No significant GU symptoms.Ocass HEART BURN  SKIN/HEME: ,no acute skin rashes suspicious lesions or bleeding. No lymphadenopathy, nodules, masses.  NEURO/ PSYCH:  No neurologic signs such as weakness numbness. No depression anxiety. IMM/ Allergy: No unusual infections.  Allergy .   REST of 12 system review negative except as per HPI   Past Medical History  Diagnosis Date  . Hypothyroidism     post thyroidectomy  . Osteopenia     t -2.5 hip in 2005 by [facility]a  . Osteoporosis     commpr fx after dog related fall  injury  . Seasonal rhinitis     spring and fall  . Skin cancer     squamous cell ca chest  . Elevated lipids   . Vaginal prolapse     hs corrected  . Dog bite of thigh 04/09/2012    Family History  Problem Relation Age of Onset  . Heart attack Mother   . Alcohol abuse Mother   . Alcohol abuse Other   . Kidney disease Other   . Cancer Other     kidne  . Heart disease Other     History   Social History  . Marital  Status: Married    Spouse Name: N/A    Number of Children: N/A  . Years of Education: N/A   Social History Main Topics  . Smoking status: Never Smoker   . Smokeless tobacco: None  . Alcohol Use: Yes  . Drug Use: No  . Sexual Activity: None   Other Topics Concern  . 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 B Complex-C (B-COMPLEX WITH VITAMIN C) tablet Take 1 tablet by mouth every morning.  . Calcium Carbonate (CALTRATE 600) 1500 MG TABS Take 1 tablet by mouth every morning.   . cholecalciferol  (VITAMIN D) 1000 UNITS tablet Take 1,000 Units by mouth every morning.   Marland Kitchen estradiol-norethindrone (COMBIPATCH) 0.05-0.14 MG/DAY Place 1 patch onto the skin 2 (two) times a week.    . risedronate (ACTONEL) 150 MG tablet Take 150 mg by mouth every 30 (thirty) days. with water on empty stomach, nothing by mouth or lie down for next 30 minutes.   Marland Kitchen SYNTHROID 100 MCG tablet TAKE 1 TABLET BY MOUTH DAILY  . [DISCONTINUED] levothyroxine (SYNTHROID, LEVOTHROID) 100 MCG tablet TAKE 1 TABLET BY MOUTH DAILY  . [DISCONTINUED] levothyroxine (SYNTHROID, LEVOTHROID) 100 MCG tablet Take 100 mcg by mouth daily before breakfast.    EXAM:  BP 124/72  Pulse 71  Temp(Src) 98.1 F (36.7 C) (Oral)  Ht 5\' 1"  (1.549 m)  Wt 163 lb (73.936 kg)  BMI 30.81 kg/m2  SpO2 97%  Body mass index is 30.81 kg/(m^2).  Physical Exam: Vital signs reviewed [id number] is a well-developed well-nourished alert cooperative   who appears stated age in no acute distress.  HEENT: normocephalic atraumatic , Eyes: PERRL EOM's full, conjunctiva clear, Nares: paten,t no deformity discharge or tenderness., Ears: no deformity EAC's clear TMs with normal landmarks. Mouth: clear OP, no lesions, edema.  Moist mucous membranes. Dentition in adequate repair. NECK: supple without masses, thyromegaly or bruits. CHEST/PULM:  Clear to auscultation and percussion breath sounds equal no wheeze , rales or rhonchi. No chest wall deformities or tenderness. Some kyphosis  Breast: normal by inspection . No dimpling, discharge, masses, tenderness or discharge . CV: PMI is nondisplaced, S1 S2 no gallops, murmurs, rubs. Peripheral pulses are full without delay.No JVD .  ABDOMEN: Bowel sounds normal nontender  No guard or rebound, no hepato splenomegal no CVA tenderness.  No hernia. Extremtities:  No clubbing cyanosis or edema, no acute joint swelling or redness no focal atrophy mild oa changes  NEURO:  Oriented x3, cranial nerves 3-12 appear to be intact, no  obvious focal weakness,gait within normal limits no abnormal reflexes or asymmetrical SKIN: No acute rashes normal turgor, color, no bruising or petechiae. PSYCH: Oriented, good eye contact, no obvious depression anxiety, cognition and judgment appear normal. LN: no cervical axillary inguinal adenopathy No noted deficits in memory, attention, and speech.   Lab Results  Component Value Date   WBC 5.1 07/16/2012   HGB 13.0 07/16/2012   HCT 38.7 07/16/2012   PLT 278.0 07/16/2012   GLUCOSE 84 07/16/2012   CHOL 188 07/16/2012   TRIG 61.0 07/16/2012   HDL 53.50 07/16/2012   LDLDIRECT 134.1 12/27/2006   LDLCALC 122*  07/16/2012   ALT 19 07/16/2012   AST 25 07/16/2012   NA 140 07/16/2012   K 4.0 07/16/2012   CL 106 07/16/2012   CREATININE 0.7 07/16/2012   BUN 20 07/16/2012   CO2 26 07/16/2012   TSH 1.20 07/16/2012   INR 1.0 07/25/2007    ASSESSMENT AND PLAN:  Discussed the following assessment and plan:  Medicare annual wellness visit, subsequent - Plan: Basic metabolic panel, CBC with Differential, Hepatic function panel, Lipid panel, TSH  Need for vaccination with 13-polyvalent pneumococcal conjugate vaccine - Plan: Pneumococcal conjugate vaccine 78-HYIFOY, Basic metabolic panel, CBC with Differential, Hepatic function panel, Lipid panel, TSH  Unspecified hypothyroidism - Plan: Basic metabolic panel, CBC with Differential, Hepatic function panel, Lipid panel, TSH  Osteoporosis, unspecified - Plan: Basic metabolic panel, CBC with Differential, Hepatic function panel, Lipid panel, TSH  Medication management - Plan: Basic metabolic panel, CBC with Differential, Hepatic function panel, Lipid panel, TSH  Heart palpitations Due for lab monitoring    meds bone health etc  Patient Care Team: Burnis Medin, MD as PCP - General Princess Bruins, MD as Attending Physician (Obstetrics and Gynecology) Addison Lank, MD as Referring Physician (Dermatology) [facility]   Patient Instructions  Continue  lifestyle intervention healthy eating and exercise . 150 minutes of exercise weeks  ,. Avoid trans fats and processed foods;  Increase fresh fruits and veges to 5 servings per day. And avoid sweet beverages  Including tea and juice. Stool cards for colon cancer screening. Plan yearly visit.  Will notify you  of labs when available.    Bone Health Our bones do many things. They provide structure, protect organs, anchor muscles, and store calcium. Adequate calcium in your diet and weight-bearing physical activity help build strong bones, improve bone amounts, and may reduce the risk of weakening of bones (osteoporosis) later in life. PEAK BONE MASS By age 54, the average woman has acquired most of her skeletal bone mass. A large decline occurs in older adults which increases the risk of osteoporosis. In women this occurs around the time of menopause. It is important for young girls to reach their peak bone mass in order to maintain bone health throughout life. A person with high bone mass as a young adult will be more likely to have a higher bone mass later in life. Not enough calcium consumption and physical activity early on could result in a failure to achieve optimum bone mass in adulthood. OSTEOPOROSIS Osteoporosis is a disease of the bones. It is defined as low bone mass with deterioration of bone structure. Osteoporosis leads to an increase risk of fractures with falls. These fractures commonly happen in the wrist, hip, and spine. While men and women of all ages and background can develop osteoporosis, some of the risk factors for osteoporosis are:  Female.  White.  Postmenopausal.  Older adults.  Small in body size.  Eating a diet low in calcium.  Physically inactive.  Smoking.  Use of some medications.  Family history. CALCIUM Calcium is a mineral needed by the body for healthy bones, teeth, and proper function of the heart, muscles, and nerves. The body cannot produce  calcium so it must be absorbed through food. Good sources of calcium include:  Dairy products (low fat or nonfat milk, cheese, and yogurt).  Dark green leafy vegetables (bok choy and broccoli).  Calcium fortified foods (orange juice, cereal, bread, soy beverages, and tofu products).  Nuts (almonds). Recommended amounts of calcium vary for individuals.  RECOMMENDED CALCIUM INTAKES Age and Amount in mg per day  Children 1 to 3 years / 700 mg  Children 4 to 8 years / 1,000 mg  Children 9 to 13 years / 1,300 mg  Teens 14 to 18 years / 1,300 mg  Adults 19 to 50 years / 1,000 mg  Adult women 51 to 70 years / 1,200 mg  Adults 71 years and older / 1,200 mg  Pregnant and breastfeeding teens / 1,300 mg  Pregnant and breastfeeding adults / 1,000 mg Vitamin D also plays an important role in healthy bone development. Vitamin D helps in the absorption of calcium. WEIGHT-BEARING PHYSICAL ACTIVITY Regular physical activity has many positive health benefits. Benefits include strong bones. Weight-bearing physical activity early in life is important in reaching peak bone mass. Weight-bearing physical activities cause muscles and bones to work against gravity. Some examples of weight bearing physical activities include:  Walking, jogging, or running.  [employer].  Jumping rope.  Dancing.  Soccer.  Tennis or Racquetball.  Stair climbing.  Basketball.  Hiking.  Weight lifting.  Aerobic fitness classes. Including weight-bearing physical activity into an exercise plan is a great way to keep bones healthy. Adults: Engage in at least 30 minutes of moderate physical activity on most, preferably all, days of the week. Children: Engage in at least 60 minutes of moderate physical activity on most, preferably all, days of the week. FOR MORE INFORMATION [place] Web designer, Soil scientist for [employer] and Promotion: www.cnpp.usda.[place]:  EquipmentWeekly.com.ee Document Released: 09/03/2003 Document Revised: 10/08/2012 Document Reviewed: 12/03/2008 [facility] Patient Information 2014 [place], [place].      Standley Brooking. Panosh M.D.

## 2013-10-16 NOTE — Assessment & Plan Note (Signed)
Quiescent at present  Seemingly ocass premature beat.

## 2013-11-04 ENCOUNTER — Other Ambulatory Visit (INDEPENDENT_AMBULATORY_CARE_PROVIDER_SITE_OTHER): Payer: Medicare Other

## 2013-11-04 DIAGNOSIS — Z1211 Encounter for screening for malignant neoplasm of colon: Secondary | ICD-10-CM | POA: Diagnosis not present

## 2013-11-04 DIAGNOSIS — Z0000 Encounter for general adult medical examination without abnormal findings: Secondary | ICD-10-CM

## 2013-11-07 LAB — HEMOCCULT GUIAC POC 1CARD (OFFICE)
FECAL OCCULT BLD: NEGATIVE
FECAL OCCULT BLD: NEGATIVE
FECAL OCCULT BLD: NEGATIVE

## 2013-11-08 NOTE — Progress Notes (Signed)
Quick Note:  Inform patient stool test negative for blood . Routine follow. ______ 

## 2013-11-27 ENCOUNTER — Other Ambulatory Visit: Payer: Self-pay | Admitting: Internal Medicine

## 2013-11-29 NOTE — Telephone Encounter (Signed)
Called and spoke to Rob at the pharmacy.  Refill request is not needed.  Refills are on file.

## 2013-12-03 DIAGNOSIS — L820 Inflamed seborrheic keratosis: Secondary | ICD-10-CM | POA: Diagnosis not present

## 2013-12-03 DIAGNOSIS — Z85828 Personal history of other malignant neoplasm of skin: Secondary | ICD-10-CM | POA: Diagnosis not present

## 2013-12-03 DIAGNOSIS — L821 Other seborrheic keratosis: Secondary | ICD-10-CM | POA: Diagnosis not present

## 2013-12-03 DIAGNOSIS — D692 Other nonthrombocytopenic purpura: Secondary | ICD-10-CM | POA: Diagnosis not present

## 2013-12-03 DIAGNOSIS — L905 Scar conditions and fibrosis of skin: Secondary | ICD-10-CM | POA: Diagnosis not present

## 2013-12-23 DIAGNOSIS — Z124 Encounter for screening for malignant neoplasm of cervix: Secondary | ICD-10-CM | POA: Diagnosis not present

## 2013-12-23 DIAGNOSIS — Z01419 Encounter for gynecological examination (general) (routine) without abnormal findings: Secondary | ICD-10-CM | POA: Diagnosis not present

## 2014-02-12 DIAGNOSIS — H5200 Hypermetropia, unspecified eye: Secondary | ICD-10-CM | POA: Diagnosis not present

## 2014-02-12 DIAGNOSIS — H2510 Age-related nuclear cataract, unspecified eye: Secondary | ICD-10-CM | POA: Diagnosis not present

## 2014-02-12 DIAGNOSIS — D3130 Benign neoplasm of unspecified choroid: Secondary | ICD-10-CM | POA: Diagnosis not present

## 2014-02-12 DIAGNOSIS — H35369 Drusen (degenerative) of macula, unspecified eye: Secondary | ICD-10-CM | POA: Diagnosis not present

## 2014-04-04 ENCOUNTER — Ambulatory Visit (INDEPENDENT_AMBULATORY_CARE_PROVIDER_SITE_OTHER): Payer: Medicare Other | Admitting: *Deleted

## 2014-04-04 DIAGNOSIS — B373 Candidiasis of vulva and vagina: Secondary | ICD-10-CM | POA: Diagnosis not present

## 2014-04-04 DIAGNOSIS — M549 Dorsalgia, unspecified: Secondary | ICD-10-CM | POA: Diagnosis not present

## 2014-04-04 DIAGNOSIS — N952 Postmenopausal atrophic vaginitis: Secondary | ICD-10-CM | POA: Diagnosis not present

## 2014-04-04 DIAGNOSIS — Z23 Encounter for immunization: Secondary | ICD-10-CM

## 2014-04-28 ENCOUNTER — Encounter: Payer: Self-pay | Admitting: Internal Medicine

## 2014-07-21 DIAGNOSIS — B373 Candidiasis of vulva and vagina: Secondary | ICD-10-CM | POA: Diagnosis not present

## 2014-07-21 DIAGNOSIS — Z1231 Encounter for screening mammogram for malignant neoplasm of breast: Secondary | ICD-10-CM | POA: Diagnosis not present

## 2014-07-21 DIAGNOSIS — N952 Postmenopausal atrophic vaginitis: Secondary | ICD-10-CM | POA: Diagnosis not present

## 2014-07-29 DIAGNOSIS — L579 Skin changes due to chronic exposure to nonionizing radiation, unspecified: Secondary | ICD-10-CM | POA: Diagnosis not present

## 2014-07-29 DIAGNOSIS — L905 Scar conditions and fibrosis of skin: Secondary | ICD-10-CM | POA: Diagnosis not present

## 2014-07-29 DIAGNOSIS — D485 Neoplasm of uncertain behavior of skin: Secondary | ICD-10-CM | POA: Diagnosis not present

## 2014-07-29 DIAGNOSIS — Z85828 Personal history of other malignant neoplasm of skin: Secondary | ICD-10-CM | POA: Diagnosis not present

## 2014-07-29 DIAGNOSIS — D1801 Hemangioma of skin and subcutaneous tissue: Secondary | ICD-10-CM | POA: Diagnosis not present

## 2014-07-29 DIAGNOSIS — D229 Melanocytic nevi, unspecified: Secondary | ICD-10-CM | POA: Diagnosis not present

## 2014-07-29 DIAGNOSIS — L821 Other seborrheic keratosis: Secondary | ICD-10-CM | POA: Diagnosis not present

## 2014-08-08 DIAGNOSIS — J029 Acute pharyngitis, unspecified: Secondary | ICD-10-CM | POA: Diagnosis not present

## 2014-08-08 DIAGNOSIS — J018 Other acute sinusitis: Secondary | ICD-10-CM | POA: Diagnosis not present

## 2014-09-15 DIAGNOSIS — M79672 Pain in left foot: Secondary | ICD-10-CM | POA: Diagnosis not present

## 2014-09-15 DIAGNOSIS — M2141 Flat foot [pes planus] (acquired), right foot: Secondary | ICD-10-CM | POA: Diagnosis not present

## 2014-09-15 DIAGNOSIS — M19072 Primary osteoarthritis, left ankle and foot: Secondary | ICD-10-CM | POA: Diagnosis not present

## 2014-09-22 DIAGNOSIS — G5762 Lesion of plantar nerve, left lower limb: Secondary | ICD-10-CM | POA: Diagnosis not present

## 2014-09-22 DIAGNOSIS — M159 Polyosteoarthritis, unspecified: Secondary | ICD-10-CM | POA: Diagnosis not present

## 2014-09-22 DIAGNOSIS — M79671 Pain in right foot: Secondary | ICD-10-CM | POA: Diagnosis not present

## 2014-09-25 DIAGNOSIS — M79672 Pain in left foot: Secondary | ICD-10-CM | POA: Diagnosis not present

## 2014-10-06 DIAGNOSIS — Z0000 Encounter for general adult medical examination without abnormal findings: Secondary | ICD-10-CM | POA: Diagnosis not present

## 2014-10-15 DIAGNOSIS — M159 Polyosteoarthritis, unspecified: Secondary | ICD-10-CM | POA: Diagnosis not present

## 2014-10-15 DIAGNOSIS — G5762 Lesion of plantar nerve, left lower limb: Secondary | ICD-10-CM | POA: Diagnosis not present

## 2014-10-18 ENCOUNTER — Other Ambulatory Visit: Payer: Self-pay | Admitting: Internal Medicine

## 2014-10-20 ENCOUNTER — Telehealth: Payer: Self-pay | Admitting: Internal Medicine

## 2014-10-20 NOTE — Telephone Encounter (Signed)
Pt needs refill on synthroid 100 mcg #90 with refills send to [facility]

## 2014-10-20 NOTE — Telephone Encounter (Signed)
Sent to the pharmacy by e-scribe. 

## 2014-10-27 ENCOUNTER — Encounter: Payer: Self-pay | Admitting: Internal Medicine

## 2014-10-27 ENCOUNTER — Ambulatory Visit (INDEPENDENT_AMBULATORY_CARE_PROVIDER_SITE_OTHER): Payer: Medicare Other | Admitting: Internal Medicine

## 2014-10-27 VITALS — BP 102/68 | Temp 97.8°F | Ht 60.5 in | Wt 162.8 lb

## 2014-10-27 DIAGNOSIS — M810 Age-related osteoporosis without current pathological fracture: Secondary | ICD-10-CM | POA: Diagnosis not present

## 2014-10-27 DIAGNOSIS — Z0000 Encounter for general adult medical examination without abnormal findings: Secondary | ICD-10-CM | POA: Diagnosis not present

## 2014-10-27 DIAGNOSIS — Z79899 Other long term (current) drug therapy: Secondary | ICD-10-CM

## 2014-10-27 DIAGNOSIS — E039 Hypothyroidism, unspecified: Secondary | ICD-10-CM | POA: Diagnosis not present

## 2014-10-27 DIAGNOSIS — Z79890 Hormone replacement therapy (postmenopausal): Secondary | ICD-10-CM | POA: Diagnosis not present

## 2014-10-27 LAB — CBC WITH DIFFERENTIAL/PLATELET
BASOS ABS: 0 10*3/uL (ref 0.0–0.1)
BASOS PCT: 0.2 % (ref 0.0–3.0)
EOS ABS: 0.2 10*3/uL (ref 0.0–0.7)
Eosinophils Relative: 3.2 % (ref 0.0–5.0)
HEMATOCRIT: 39 % (ref 36.0–46.0)
Hemoglobin: 13.1 g/dL (ref 12.0–15.0)
LYMPHS ABS: 2.5 10*3/uL (ref 0.7–4.0)
LYMPHS PCT: 37 % (ref 12.0–46.0)
MCHC: 33.6 g/dL (ref 30.0–36.0)
MCV: 94.4 fl (ref 78.0–100.0)
MONOS PCT: 10 % (ref 3.0–12.0)
Monocytes Absolute: 0.7 10*3/uL (ref 0.1–1.0)
Neutro Abs: 3.3 10*3/uL (ref 1.4–7.7)
Neutrophils Relative %: 49.6 % (ref 43.0–77.0)
Platelets: 283 10*3/uL (ref 150.0–400.0)
RBC: 4.13 Mil/uL (ref 3.87–5.11)
RDW: 13.8 % (ref 11.5–15.5)
WBC: 6.7 10*3/uL (ref 4.0–10.5)

## 2014-10-27 LAB — BASIC METABOLIC PANEL
BUN: 20 mg/dL (ref 6–23)
CALCIUM: 9.7 mg/dL (ref 8.4–10.5)
CO2: 27 meq/L (ref 19–32)
Chloride: 111 mEq/L (ref 96–112)
Creatinine, Ser: 0.77 mg/dL (ref 0.40–1.20)
GFR: 78.05 mL/min (ref >60.00)
GLUCOSE: 91 mg/dL (ref 70–99)
Potassium: 4.7 mEq/L (ref 3.5–5.1)
Sodium: 147 mEq/L — ABNORMAL HIGH (ref 135–145)

## 2014-10-27 LAB — LIPID PANEL
CHOL/HDL RATIO: 3
CHOLESTEROL: 182 mg/dL (ref 0–200)
HDL: 61.7 mg/dL (ref >39.00)
LDL Cholesterol: 106 mg/dL — ABNORMAL HIGH (ref 0–99)
NonHDL: 120.3
Triglycerides: 74 mg/dL (ref 0.0–149.0)
VLDL: 14.8 mg/dL (ref 0.0–40.0)

## 2014-10-27 LAB — HEPATIC FUNCTION PANEL
ALBUMIN: 3.9 g/dL (ref 3.5–5.2)
ALK PHOS: 43 U/L (ref 39–117)
ALT: 15 U/L (ref 0–35)
AST: 20 U/L (ref 0–37)
BILIRUBIN DIRECT: 0.2 mg/dL (ref 0.0–0.3)
TOTAL PROTEIN: 6.8 g/dL (ref 6.0–8.3)
Total Bilirubin: 1.2 mg/dL (ref 0.2–1.2)

## 2014-10-27 LAB — TSH: TSH: 0.07 u[IU]/mL — AB (ref 0.35–4.50)

## 2014-10-27 NOTE — Patient Instructions (Signed)
Continue lifestyle intervention healthy eating and exercise . Will notify you  of labs when available. Yearly a flu vaccine    Healthy lifestyle includes : At least 150 minutes of exercise weeks  , weight at healthy levels, which is usually   BMI 19-25. Avoid trans fats and processed foods;  Increase fresh fruits and veges to 5 servings per day. And avoid sweet beverages including tea and juice. Mediterranean diet with olive oil and nuts have been noted to be heart and brain healthy . Avoid tobacco products . Limit  alcohol to  7 per week for women and 14 servings for men.  Get adequate sleep . Wear seat belts . Don't text and drive .

## 2014-10-27 NOTE — Progress Notes (Signed)
Pre visit review using our clinic review tool, if applicable. No additional management support is needed unless otherwise documented below in the visit note.  Chief Complaint  Patient presents with  . Medicare Wellness    HPI: [name] [name] 74 y.o. comes in today for Preventive Medicare wellness visit . mortons neruoma  Left   Ortho  used boot  On hrt  On [place]  Turned right ankle  Less activity then otherwise  good  Did lifescreening   Health Maintenance  Topic Date Due  . COLONOSCOPY  06/27/2014  . INFLUENZA VACCINE  01/26/2015  . MAMMOGRAM  07/23/2015  . TETANUS/TDAP  12/26/2016  . DEXA SCAN  Completed  . ZOSTAVAX  Completed  . PNA vac Low Risk Adult  Completed   Health Maintenance Review LIFESTYLE:  Exercise:  Walking weight trainign  Tobacco/ETS:no Alcohol: per day  Sugar beverages:no Sleep: 6-8 Drug use: no Works 40 - 50  Bone density: 2011 Colonoscopy:  MEDICARE DOCUMENT QUESTIONS  TO SCAN   Hearing: ok  Vision:  No limitations at present . Last eye check UTD  Safety:  Has smoke detector and wears seat belts.  No firearms. No excess sun exposure. Sees dentist regularly.  Falls:  no  Advance directive :  Reviewed  Has one.  Memory: Felt to be good  , no concern from her or her family.  Depression: No anhedonia unusual crying or depressive symptoms  Nutrition: Eats well balanced diet; adequate calcium and vitamin D. No swallowing chewing problems.  Injury: no major injuries in the last six months.  Other healthcare providers:  Reviewed today .  Social:  Lives with spouse married. 2 dogs and cat indoor  Children   Preventive parameters: up-to-date  Reviewed   ADLS:   There are no problems or need for assistance  driving, feeding, obtaining food, dressing, toileting and bathing, managing money using phone. She is independent.works ft   ROS:  GEN/ HEENT: No fever, significant weight changes sweats headaches vision problems hearing  changes, CV/ PULM; No chest pain shortness of breath cough, syncope,edema  change in exercise tolerance. GI /GU: No adominal pain, vomiting, change in bowel habits. No blood in the stool. No significant GU symptoms. SKIN/HEME: ,no acute skin rashes suspicious lesions or bleeding. No lymphadenopathy, nodules, masses.  NEURO/ PSYCH:  No neurologic signs such as weakness numbness. No depression anxiety. IMM/ Allergy: No unusual infections.  Allergy .   REST of 12 system review negative except as per HPI   Past Medical History  Diagnosis Date  . Hypothyroidism     post thyroidectomy  . Osteopenia     t -2.5 hip in 2005 by Dexa  . Osteoporosis     commpr fx after dog related fall  injury  . Seasonal rhinitis     spring and fall  . Skin cancer     squamous cell ca chest  . Elevated lipids   . Vaginal prolapse     hs corrected  . Dog bite of thigh 04/09/2012    Family History  Problem Relation Age of Onset  . Heart attack Mother   . Alcohol abuse Mother   . Alcohol abuse Other   . Kidney disease Other   . Cancer Other     kidne  . Heart disease Other     History   Social History  . Marital Status: Married    Spouse Name: N/A  . Number of Children: N/A  . Years of Education: N/A  Social History Main Topics  . Smoking status: Never Smoker   . Smokeless tobacco: Not on file  . Alcohol Use: Yes  . Drug Use: No  . Sexual Activity: Not on file   Other Topics Concern  . 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 B Complex-C (B-COMPLEX WITH VITAMIN C) tablet Take 1 tablet by mouth every morning.  . Calcium Carbonate (CALTRATE 600) 1500 MG TABS Take 1 tablet by mouth every morning.   . cholecalciferol (VITAMIN D) 1000 UNITS tablet Take 1,000 Units by mouth every morning.   Marland Kitchen  MINIVELLE 0.0375 MG/24HR Place 1 patch onto the skin 2 times a week  . risedronate (ACTONEL) 150 MG tablet Take 150 mg by mouth every 30 (thirty) days. with water on empty stomach, nothing by mouth or lie down for next 30 minutes.   Marland Kitchen SYNTHROID 100 MCG tablet TAKE 1 TABLET BY MOUTH DAILY  . SYNTHROID 100 MCG tablet TAKE 1 TABLET BY MOUTH DAILY  . [DISCONTINUED] estradiol-norethindrone (COMBIPATCH) 0.05-0.14 MG/DAY Place 1 patch onto the skin 2 (two) times a week.     No facility-administered encounter medications on file as of 10/27/2014.    EXAM:  BP 102/68 mmHg  Temp(Src) 97.8 F (36.6 C) (Oral)  Ht 5' 0.5" (1.537 m)  Wt 162 lb 12.8 oz (73.846 kg)  BMI 31.26 kg/m2  Body mass index is 31.26 kg/(m^2).  Physical Exam: Vital signs reviewed WUJ:WJXB is a well-developed well-nourished alert cooperative   who appears stated age in no acute distress.  HEENT: normocephalic atraumatic , Eyes: PERRL EOM's full, conjunctiva clear, Nares: paten,t no deformity discharge or tenderness., Ears: no deformity EAC's clear TMs with normal landmarks. Mouth: clear OP, no lesions, edema.  Moist mucous membranes. Dentition in adequate repair. NECK: supple without masses, thyromegaly or bruits. CHEST/PULM:  Clear to auscultation and percussion breath sounds equal no wheeze , rales or rhonchi. No chest wall deformities or tenderness. Mild kyphosis  CV: PMI is nondisplaced, S1 S2 no gallops, murmurs, rubs. Peripheral pulses are full without delay.No JVD .  ABDOMEN: Bowel sounds normal nontender  No guard or rebound, no hepato splenomegal no CVA tenderness.  No hernia. Extremtities:  No clubbing cyanosis or edema, no acute joint swelling or redness no focal atrophy NEURO:  Oriented x3, cranial nerves 3-12 appear to be intact, no obvious focal weakness,gait within normal limits no abnormal reflexes or asymmetrical SKIN: No acute rashes normal turgor, color, no bruising or petechiae. PSYCH: Oriented, good eye  contact, no obvious depression anxiety, cognition and judgment appear normal. LN: no cervical axillary inguinal adenopathy No noted deficits in memory, attention, and speech.     ASSESSMENT AND PLAN:  Discussed the following assessment and plan:  Medicare annual wellness visit, subsequent  Hypothyroidism, unspecified hypothyroidism type - Plan: Basic metabolic panel, CBC with Differential/Platelet, Hepatic function panel, Lipid panel, TSH  Medication management - Plan: Basic metabolic panel, CBC with Differential/Platelet, Hepatic function panel, Lipid panel, TSH  Osteoporosis - Plan: Basic metabolic panel, CBC with Differential/Platelet, Hepatic function panel, Lipid panel, TSH  Postmenopausal HRT (hormone replacement therapy) - pergyne  Patient Care Team: Burnis Medin, MD as PCP - General Princess Bruins, MD  as Attending Physician (Obstetrics and Gynecology) Addison Lank, MD as Referring Physician (Dermatology) [facility]   Patient Instructions  Continue lifestyle intervention healthy eating and exercise . Will notify you  of labs when available. Yearly a flu vaccine    Healthy lifestyle includes : At least 150 minutes of exercise weeks  , weight at healthy levels, which is usually   BMI 19-25. Avoid trans fats and processed foods;  Increase fresh fruits and veges to 5 servings per day. And avoid sweet beverages including tea and juice. Mediterranean diet with olive oil and nuts have been noted to be heart and brain healthy . Avoid tobacco products . Limit  alcohol to  7 per week for women and 14 servings for men.  Get adequate sleep . Wear seat belts . Don't text and drive .       Standley Brooking. Panosh M.D.   Lab Results  Component Value Date   WBC 6.7 10/27/2014   HGB 13.1 10/27/2014   HCT 39.0 10/27/2014   PLT 283.0 10/27/2014   GLUCOSE 91 10/27/2014   CHOL 182 10/27/2014   TRIG 74.0 10/27/2014   HDL 61.70 10/27/2014   LDLDIRECT 134.1 12/27/2006   LDLCALC  106* 10/27/2014   ALT 15 10/27/2014   AST 20 10/27/2014   NA 147* 10/27/2014   K 4.7 10/27/2014   CL 111 10/27/2014   CREATININE 0.77 10/27/2014   BUN 20 10/27/2014   CO2 27 10/27/2014   TSH 0.07* 10/27/2014   INR 1.0 07/25/2007   Will address over suppressed tsh

## 2014-10-28 DIAGNOSIS — M25571 Pain in right ankle and joints of right foot: Secondary | ICD-10-CM | POA: Diagnosis not present

## 2014-10-28 DIAGNOSIS — S93401A Sprain of unspecified ligament of right ankle, initial encounter: Secondary | ICD-10-CM | POA: Diagnosis not present

## 2014-10-28 DIAGNOSIS — M2141 Flat foot [pes planus] (acquired), right foot: Secondary | ICD-10-CM | POA: Diagnosis not present

## 2014-10-31 ENCOUNTER — Encounter: Payer: Self-pay | Admitting: Internal Medicine

## 2014-11-03 ENCOUNTER — Telehealth: Payer: Self-pay | Admitting: Internal Medicine

## 2014-11-03 ENCOUNTER — Other Ambulatory Visit: Payer: Self-pay | Admitting: Internal Medicine

## 2014-11-03 ENCOUNTER — Other Ambulatory Visit: Payer: Self-pay | Admitting: Family Medicine

## 2014-11-03 DIAGNOSIS — E039 Hypothyroidism, unspecified: Secondary | ICD-10-CM

## 2014-11-03 MED ORDER — SYNTHROID 88 MCG PO TABS
88.0000 ug | ORAL_TABLET | Freq: Every day | ORAL | Status: DC
Start: 2014-11-03 — End: 2015-06-30

## 2014-11-03 MED ORDER — LEVOTHYROXINE SODIUM 88 MCG PO TABS
88.0000 ug | ORAL_TABLET | Freq: Every day | ORAL | Status: DC
Start: 2014-11-03 — End: 2014-11-03

## 2014-11-03 NOTE — Telephone Encounter (Signed)
Pt would like a call back she said the following med has been changed per mychart   SYNTHROID 100 MCG tablet

## 2014-11-03 NOTE — Telephone Encounter (Signed)
Spoke to the pt.  She stated someone had already called her about her lab work.  No further action needed.

## 2015-01-26 DIAGNOSIS — L905 Scar conditions and fibrosis of skin: Secondary | ICD-10-CM | POA: Diagnosis not present

## 2015-01-26 DIAGNOSIS — Z85828 Personal history of other malignant neoplasm of skin: Secondary | ICD-10-CM | POA: Diagnosis not present

## 2015-01-26 DIAGNOSIS — L821 Other seborrheic keratosis: Secondary | ICD-10-CM | POA: Diagnosis not present

## 2015-01-26 DIAGNOSIS — D1801 Hemangioma of skin and subcutaneous tissue: Secondary | ICD-10-CM | POA: Diagnosis not present

## 2015-01-26 DIAGNOSIS — D229 Melanocytic nevi, unspecified: Secondary | ICD-10-CM | POA: Diagnosis not present

## 2015-01-26 DIAGNOSIS — D692 Other nonthrombocytopenic purpura: Secondary | ICD-10-CM | POA: Diagnosis not present

## 2015-02-03 ENCOUNTER — Other Ambulatory Visit (INDEPENDENT_AMBULATORY_CARE_PROVIDER_SITE_OTHER): Payer: Medicare Other

## 2015-02-03 DIAGNOSIS — E039 Hypothyroidism, unspecified: Secondary | ICD-10-CM

## 2015-02-03 LAB — TSH: TSH: 0.9 u[IU]/mL (ref 0.35–4.50)

## 2015-02-09 ENCOUNTER — Encounter: Payer: Self-pay | Admitting: Internal Medicine

## 2015-02-12 ENCOUNTER — Other Ambulatory Visit: Payer: Self-pay | Admitting: Family Medicine

## 2015-02-12 DIAGNOSIS — E039 Hypothyroidism, unspecified: Secondary | ICD-10-CM

## 2015-02-16 DIAGNOSIS — H35363 Drusen (degenerative) of macula, bilateral: Secondary | ICD-10-CM | POA: Diagnosis not present

## 2015-02-16 DIAGNOSIS — H2513 Age-related nuclear cataract, bilateral: Secondary | ICD-10-CM | POA: Diagnosis not present

## 2015-02-16 DIAGNOSIS — D3132 Benign neoplasm of left choroid: Secondary | ICD-10-CM | POA: Diagnosis not present

## 2015-02-17 DIAGNOSIS — M5136 Other intervertebral disc degeneration, lumbar region: Secondary | ICD-10-CM | POA: Diagnosis not present

## 2015-02-17 DIAGNOSIS — M545 Low back pain: Secondary | ICD-10-CM | POA: Diagnosis not present

## 2015-03-04 ENCOUNTER — Other Ambulatory Visit: Payer: Self-pay | Admitting: Internal Medicine

## 2015-03-05 NOTE — Telephone Encounter (Signed)
Sent to the pharmacy by e-scribe. 

## 2015-03-24 DIAGNOSIS — Z01419 Encounter for gynecological examination (general) (routine) without abnormal findings: Secondary | ICD-10-CM | POA: Diagnosis not present

## 2015-03-24 DIAGNOSIS — Z23 Encounter for immunization: Secondary | ICD-10-CM | POA: Diagnosis not present

## 2015-03-24 DIAGNOSIS — Z124 Encounter for screening for malignant neoplasm of cervix: Secondary | ICD-10-CM | POA: Diagnosis not present

## 2015-06-30 ENCOUNTER — Encounter: Payer: Self-pay | Admitting: *Deleted

## 2015-06-30 ENCOUNTER — Emergency Department (INDEPENDENT_AMBULATORY_CARE_PROVIDER_SITE_OTHER)
Admission: EM | Admit: 2015-06-30 | Discharge: 2015-06-30 | Disposition: A | Discharge status: Home / Self Care | Payer: Medicare Other | Source: Home / Self Care | Attending: Family Medicine | Admitting: Family Medicine

## 2015-06-30 DIAGNOSIS — J069 Acute upper respiratory infection, unspecified: Secondary | ICD-10-CM | POA: Diagnosis not present

## 2015-06-30 DIAGNOSIS — B9789 Other viral agents as the cause of diseases classified elsewhere: Principal | ICD-10-CM

## 2015-06-30 MED ORDER — AZITHROMYCIN 250 MG PO TABS: ORAL_TABLET | ORAL | Status: DC

## 2015-06-30 NOTE — Discharge Instructions (Signed)
Take plain guaifenesin (1200mg  extended release tabs such as Mucinex) twice daily, with plenty of water, for cough and congestion.  May add Pseudoephedrine (30mg , one or two every 4 to 6 hours) for sinus congestion.  Get adequate rest.   May use Afrin nasal spray (or generic oxymetazoline) twice daily for about 5 days and then discontinue.  Also recommend using saline nasal spray several times daily and saline nasal irrigation (AYR is a common brand).  Use Flonase nasal spray each morning after using Afrin nasal spray and saline nasal irrigation. Try warm salt water gargles for sore throat.  Stop all antihistamines for now, and other non-prescription cough/cold preparations. May take Delsym Cough Suppressant at bedtime for nighttime cough.  Begin Azithromycin if not improving about one week or if persistent fever develops    Follow-up with family doctor if not improving about10 days.

## 2015-06-30 NOTE — ED Notes (Signed)
Pt c/o 1 month of watering eyes, runny nose, sneezing. Worse more recently. Low grade fever this AM.

## 2015-06-30 NOTE — ED Provider Notes (Signed)
CSN: [id number]     Arrival date & time 06/30/15  1237 History   First MD Initiated Contact with Patient 06/30/15 1343     Chief Complaint  Patient presents with  . Nasal Congestion  . Eye Drainage      HPI Comments: Patient has persistent watery eyes for about a month without pain or changes in vision. During the past 3 to 4 days she has had typical cold-like symptoms including sinus congestion, low grade fever, fatigue, and cough.   The history is provided by the patient.    Past Medical History  Diagnosis Date  . Hypothyroidism     post thyroidectomy  . Osteopenia     t -2.5 hip in 2005 by Dexa  . Osteoporosis     commpr fx after dog related fall  injury  . Seasonal rhinitis     spring and fall  . Skin cancer     squamous cell ca chest  . Elevated lipids   . Vaginal prolapse     hs corrected  . Dog bite of thigh 04/09/2012   Past Surgical History  Procedure Laterality Date  . Thyroidectomy    . Colonoscopy  2006  . Abdominal hysterectomy  07/2007  . Bladders sling surgery  07/2007   Family History  Problem Relation Age of Onset  . Heart attack Mother   . Alcohol abuse Mother   . Alcohol abuse Other   . Kidney disease Other   . Cancer Other     kidne  . Heart disease Other    Social History  Substance Use Topics  . Smoking status: Never Smoker   . Smokeless tobacco: None  . Alcohol Use: Yes   OB History    Gravida Para Term Preterm AB TAB SAB Ectopic Multiple Living   1 1             Review of Systems No sore throat + cough + sneezing No pleuritic pain No wheezing + nasal congestion + post-nasal drainage No sinused eyes pain/pressure No itchy/red eyes No earache No hemoptysis No SOB + fever, + chills No nausea No vomiting No abdominal pain No diarrhea No urinary symptoms No skin rash + fatigue No myalgias No headache Used OTC meds without relief  Allergies  Review of patient's allergies indicates no known allergies.  Home  Medications   Prior to Admission medications   Medication Sig Start Date End Date Taking? Authorizing Provider  UNABLE TO FIND Med Name: [name] 0.3mg    Yes Historical Provider, MD  azithromycin (ZITHROMAX Z-PAK) 250 MG tablet Take 2 tabs today; then begin one tab once daily for 4 more days. (Rx void after 07/08/15) 06/30/15   Kandra Nicolas, MD  B Complex-C (B-COMPLEX WITH VITAMIN C) tablet Take 1 tablet by mouth every morning.    Historical Provider, MD  cholecalciferol (VITAMIN D) 1000 UNITS tablet Take 1,000 Units by mouth every morning.     Historical Provider, MD  MINIVELLE 0.0375 MG/24HR Place 1 patch onto the skin 2 times a week 10/14/14   Historical Provider, MD  risedronate (ACTONEL) 150 MG tablet Take 150 mg by mouth every 30 (thirty) days. with water on empty stomach, nothing by mouth or lie down for next 30 minutes.     Historical Provider, MD  SYNTHROID 88 MCG tablet TAKE 1 TABLET(88 MCG) BY MOUTH DAILY BEFORE BREAKFAST 03/05/15   Burnis Medin, MD   Meds Ordered and Administered this Visit  Medications - No  data to display  BP 136/84 mmHg  Pulse 78  Temp(Src) 97.9 F (36.6 C) (Oral)  Resp 16  Ht 5\' 1"  (1.549 m)  Wt 166 lb (75.297 kg)  BMI 31.38 kg/m2  SpO2 96% No data found.   Physical Exam Nursing notes and Vital Signs reviewed. Appearance:  Patient appears stated age, and in no acute distress Eyes:  Pupils are equal, round, and reactive to light and accomodation.  Extraocular movement is intact.  Conjunctivae are not inflamed  Ears:  Canals normal.  Tympanic membranes normal.  Nose:   Congested turbinates.  No sinus tenderness.  Pharynx:  Normal Neck:  Supple.  Tender enlarged posterior nodes are palpated bilaterally  Lungs:  Clear to auscultation.  Breath sounds are equal.  Moving air well. Heart:  Regular rate and rhythm without murmurs, rubs, or gallops.  Abdomen:  Nontender without masses or hepatosplenomegaly.  Bowel sounds are present.  No CVA or flank  tenderness.  Extremities:  No edema.  Skin:  No rash present.   ED Course  Procedures none  MDM   1. Viral URI with cough    There is no evidence of bacterial infection today.  Treat symptomatically for now  Take plain guaifenesin (1200mg  extended release tabs such as Mucinex) twice daily, with plenty of water, for cough and congestion.  May add Pseudoephedrine (30mg , one or two every 4 to 6 hours) for sinus congestion.  Get adequate rest.   May use Afrin nasal spray (or generic oxymetazoline) twice daily for about 5 days and then discontinue.  Also recommend using saline nasal spray several times daily and saline nasal irrigation (AYR is a common brand).  Use Flonase nasal spray each morning after using Afrin nasal spray and saline nasal irrigation. Try warm salt water gargles for sore throat.  Stop all antihistamines for now, and other non-prescription cough/cold preparations. May take Delsym Cough Suppressant at bedtime for nighttime cough.  Begin Azithromycin if not improving about one week or if persistent fever develops (Given a prescription to hold, with an expiration date)  Follow-up with family doctor if not improving about10 days.     Kandra Nicolas, MD 07/04/15 [phone]

## 2015-07-15 DIAGNOSIS — D229 Melanocytic nevi, unspecified: Secondary | ICD-10-CM | POA: Diagnosis not present

## 2015-07-15 DIAGNOSIS — Z85828 Personal history of other malignant neoplasm of skin: Secondary | ICD-10-CM | POA: Diagnosis not present

## 2015-07-15 DIAGNOSIS — D1801 Hemangioma of skin and subcutaneous tissue: Secondary | ICD-10-CM | POA: Diagnosis not present

## 2015-07-15 DIAGNOSIS — D692 Other nonthrombocytopenic purpura: Secondary | ICD-10-CM | POA: Diagnosis not present

## 2015-07-15 DIAGNOSIS — L821 Other seborrheic keratosis: Secondary | ICD-10-CM | POA: Diagnosis not present

## 2015-07-15 DIAGNOSIS — L905 Scar conditions and fibrosis of skin: Secondary | ICD-10-CM | POA: Diagnosis not present

## 2015-07-15 DIAGNOSIS — L570 Actinic keratosis: Secondary | ICD-10-CM | POA: Diagnosis not present

## 2015-07-17 ENCOUNTER — Other Ambulatory Visit (INDEPENDENT_AMBULATORY_CARE_PROVIDER_SITE_OTHER): Payer: Medicare Other

## 2015-07-17 DIAGNOSIS — E039 Hypothyroidism, unspecified: Secondary | ICD-10-CM

## 2015-07-17 LAB — TSH: TSH: 2.44 u[IU]/mL (ref 0.35–4.50)

## 2015-07-27 DIAGNOSIS — Z1231 Encounter for screening mammogram for malignant neoplasm of breast: Secondary | ICD-10-CM | POA: Diagnosis not present

## 2015-08-06 DIAGNOSIS — R928 Other abnormal and inconclusive findings on diagnostic imaging of breast: Secondary | ICD-10-CM | POA: Diagnosis not present

## 2015-08-06 DIAGNOSIS — R922 Inconclusive mammogram: Secondary | ICD-10-CM | POA: Diagnosis not present

## 2015-08-27 DIAGNOSIS — M205X1 Other deformities of toe(s) (acquired), right foot: Secondary | ICD-10-CM | POA: Diagnosis not present

## 2015-11-11 ENCOUNTER — Emergency Department (HOSPITAL_COMMUNITY)
Admission: EM | Admit: 2015-11-11 | Discharge: 2015-11-11 | Disposition: A | Discharge status: Home / Self Care | Payer: Medicare Other | Attending: Emergency Medicine | Admitting: Emergency Medicine

## 2015-11-11 ENCOUNTER — Encounter: Payer: Self-pay | Admitting: Internal Medicine

## 2015-11-11 ENCOUNTER — Ambulatory Visit (INDEPENDENT_AMBULATORY_CARE_PROVIDER_SITE_OTHER): Payer: Medicare Other | Admitting: Internal Medicine

## 2015-11-11 ENCOUNTER — Encounter (HOSPITAL_COMMUNITY): Payer: Self-pay | Admitting: Emergency Medicine

## 2015-11-11 VITALS — BP 130/82 | HR 79 | Temp 97.6°F | Ht 61.0 in | Wt 168.0 lb

## 2015-11-11 DIAGNOSIS — Z79899 Other long term (current) drug therapy: Secondary | ICD-10-CM | POA: Diagnosis not present

## 2015-11-11 DIAGNOSIS — S81852A Open bite, left lower leg, initial encounter: Secondary | ICD-10-CM | POA: Diagnosis not present

## 2015-11-11 DIAGNOSIS — Z23 Encounter for immunization: Secondary | ICD-10-CM

## 2015-11-11 DIAGNOSIS — W5501XA Bitten by cat, initial encounter: Secondary | ICD-10-CM | POA: Insufficient documentation

## 2015-11-11 DIAGNOSIS — S80812A Abrasion, left lower leg, initial encounter: Secondary | ICD-10-CM | POA: Insufficient documentation

## 2015-11-11 DIAGNOSIS — Z203 Contact with and (suspected) exposure to rabies: Secondary | ICD-10-CM | POA: Insufficient documentation

## 2015-11-11 DIAGNOSIS — Y9389 Activity, other specified: Secondary | ICD-10-CM | POA: Diagnosis not present

## 2015-11-11 DIAGNOSIS — E039 Hypothyroidism, unspecified: Secondary | ICD-10-CM | POA: Insufficient documentation

## 2015-11-11 DIAGNOSIS — Z8742 Personal history of other diseases of the female genital tract: Secondary | ICD-10-CM | POA: Diagnosis not present

## 2015-11-11 DIAGNOSIS — M810 Age-related osteoporosis without current pathological fracture: Secondary | ICD-10-CM | POA: Diagnosis not present

## 2015-11-11 DIAGNOSIS — Y9289 Other specified places as the place of occurrence of the external cause: Secondary | ICD-10-CM | POA: Insufficient documentation

## 2015-11-11 DIAGNOSIS — Z85828 Personal history of other malignant neoplasm of skin: Secondary | ICD-10-CM | POA: Insufficient documentation

## 2015-11-11 DIAGNOSIS — Y998 Other external cause status: Secondary | ICD-10-CM | POA: Diagnosis not present

## 2015-11-11 MED ORDER — RABIES IMMUNE GLOBULIN 150 UNIT/ML IM INJ
20.0000 [IU]/kg | INJECTION | Freq: Once | INTRAMUSCULAR | Status: AC
  Administered 2015-11-11: 1500 [IU] via INTRAMUSCULAR
  Filled 2015-11-11: qty 10

## 2015-11-11 MED ORDER — AMOXICILLIN-POT CLAVULANATE 875-125 MG PO TABS: 1.0000 | ORAL_TABLET | Freq: Two times a day (BID) | ORAL | Status: DC

## 2015-11-11 MED ORDER — TETANUS-DIPHTH-ACELL PERTUSSIS 5-2.5-18.5 LF-MCG/0.5 IM SUSP
0.5000 mL | Freq: Once | INTRAMUSCULAR | Status: AC
  Administered 2015-11-11: 0.5 mL via INTRAMUSCULAR

## 2015-11-11 MED ORDER — RABIES VIRUS VACCINE, HDC IM INJ
1.0000 mL | INJECTION | Freq: Once | INTRAMUSCULAR | Status: AC
  Administered 2015-11-11: 1 mL via INTRAMUSCULAR
  Filled 2015-11-11: qty 1

## 2015-11-11 NOTE — ED Notes (Signed)
Pt reports that she was bit on the left leg by a feral cat. Pt in no distress.

## 2015-11-11 NOTE — Discharge Instructions (Signed)
Keep wound clean with mild soap and water. Keep area covered with a topical antibiotic ointment and bandage, keep bandage dry. Ice and elevate for additional pain relief and swelling. Alternate between Ibuprofen and Tylenol for additional pain relief. Take your antibiotic as directed by your regular doctor. You received the first doses of rabies vaccines here, you will need to follow up with the URGENT CARE in 1 week for your second dose, and for ongoing rabies vaccinations. Follow up with your primary care doctor in approximately 7 days for wound recheck. Monitor area for signs of infection to include, but not limited to: increasing pain, spreading redness, drainage/pus, worsening swelling, or fevers. Return to emergency department for emergent changing or worsening symptoms.    Animal Bite Animal bite wounds can get infected. It is important to get proper medical treatment. Ask your doctor if you need rabies treatment. HOME CARE  Wound Care  Follow instructions from your doctor about how to take care of your wound. Make sure you:  Wash your hands with soap and water before you change your bandage (dressing). If you cannot use soap and water, use hand sanitizer.  Change your bandage as told by your doctor.  Leave stitches (sutures), skin glue, or skin tape (adhesive) strips in place. They may need to stay in place for 2 weeks or longer. If tape strips get loose and curl up, you may trim the loose edges. Do not remove tape strips completely unless your doctor says it is okay.  Check your wound every day for signs of infection. Watch for:    Redness, swelling, or pain that gets worse.    Fluid, blood, or pus.  General Instructions  Take or apply over-the-counter and prescription medicines only as told by your doctor.   If you were prescribed an antibiotic, take or apply it as told by your doctor. Do not stop using the antibiotic even if your condition improves.   Keep the injured  area raised (elevated) above the level of your heart while you are sitting or lying down.  If directed, apply ice to the injured area.    Put ice in a plastic bag.    Place a towel between your skin and the bag.    Leave the ice on for 20 minutes, 2-3 times per day.   Keep all follow-up visits as told by your doctor. This is important.  GET HELP IF:  You have redness, swelling, or pain that gets worse.   You have a general feeling of sickness (malaise).   You feel sick to your stomach (nauseous).  You throw up (vomit).   You have pain that does not get better.  GET HELP RIGHT AWAY IF:   You have a red streak going away from your wound.   You have fluid, blood, or pus coming from your wound.   You have a fever or chills.   You have trouble moving your injured area.   You have numbness or tingling anywhere on your body.    This information is not intended to replace advice given to you by your health care provider. Make sure you discuss any questions you have with your health care provider.   Document Released: 06/13/2005 Document Revised: 03/04/2015 Document Reviewed: 10/29/2014 Elsevier Interactive Patient Education 2016 [place] is a viral infection that can be spread to people from infected animals. The infection affects the brain and central nervous system. Once the disease develops, it almost always  causes death. Because of this, when a person is bitten by an animal that may have rabies, treatment to prevent rabies often needs to be started whether or not the animal is known to be infected. Prompt treatment with the rabies vaccine and rabies immune globulin is very effective at preventing the infection from developing in people who have been exposed to the rabies virus. CAUSES  Rabies is caused by a virus that lives inside some animals. When a person is bitten by an infected animal, the rabies virus is spread to the person through  the infected spit (saliva) of the animal. This virus can be carried by animals such as dogs, cats, skunks, bats, woodchucks, raccoons, coyotes, and foxes. SYMPTOMS  By the time symptoms appear, rabies is usually fatal for the person. Common symptoms include:  Headache.  Fever.  Fatigue and weakness.  Agitation.  Anxiety.  Confusion.  Unusual behavior, such as hyperactivity, fear of water (hydrophobia), or fear of air (aerophobia).  Hallucinations.  Insomnia.  Weakness in the arms or legs.  Difficulty swallowing. Most people get sick in 1-3 months after being bitten. This often varies and may depend on the location of the bite. The infection will take less time to develop if the bite occurred closer to the head.  DIAGNOSIS  To determine if a person is infected, several tests must be performed, such as:  A skin biopsy.  A saliva test.  A lumbar puncture to remove spinal fluid so it can be examined.  Blood tests. TREATMENT  Treatment to prevent the infection from developing (post-exposure prophylaxis, PEP) is often started before knowing for sure if the person has been exposed to the rabies virus. PEP involves cleaning the wound, giving an antibody injection (rabies immune globulin), and giving a series of rabies vaccine injections. The series of injections are usually given over a two-week period. If possible, the animal that bit the person will be observed to see if it remains healthy. If the animal has been killed, it can be sent to a state laboratory and examined to see if the animal had rabies. If a person is bitten by a domestic animal (dog, cat, or ferret) that appears healthy and can be observed to see if it remains healthy, often no further treatment is necessary other than care of the wounds caused by the animal. Rabies is often a fatal illness once the infection develops in a person. Although a few people who developed rabies have survived after experimental treatment  with certain drugs, all these survivors still had severe nervous system problems after the treatment. This is why caregivers use extra caution and begin PEP treatment for people who have been bitten by animals that are possibly infected with rabies.  HOME CARE INSTRUCTIONS  If you were bitten by an unknown animal, make sure you know your caregiver's instructions for follow-up. If the animal was sent to a laboratory for examination, ask when the test results will be ready. Make sure you get the test results.  Take these steps to care for your wound:  Keep the wound clean, dry, and dressed as directed by your caregiver.  Keep the injured part elevated as much as possible.  Do not resume use of the affected area until directed.  Only take over-the-counter or prescription medicines as directed by your caregiver.  Keep all follow-up appointments as directed by your caregiver. PREVENTION  To prevent rabies, people need to reduce their risk of having contact with infected animals.  Make sure your pets (dogs, cats, ferrets) are vaccinated against rabies. Keep these vaccinations up-to-date as directed by your veterinarian.  Supervise your pets when they are outside. Keep them away from wild animals.  Call your local animal control services to report any stray animals. These animals may not be vaccinated.  Stay away from stray or wild animals.  Consider getting the rabies vaccine (preexposure) if you are traveling to an area where rabies is common or if your job or activities involve possible contact with wild or stray animals. Discuss this with your caregiver.   This information is not intended to replace advice given to you by your health care provider. Make sure you discuss any questions you have with your health care provider.   Document Released: 06/13/2005 Document Revised: 07/04/2014 Document Reviewed: 01/10/2012 Elsevier Interactive Patient Education [employer].

## 2015-11-11 NOTE — Progress Notes (Signed)
Chief Complaint  Patient presents with  . Animal Bite    ferrell cat bite yesterday PM, left lower leg    HPI: [name] [name] 75 y.o.  [name]  comes in today for SDA for  new problem evaluation. Yesterday evening   tired to give foot to a stray kitten gray   [place] [employer]  [place].  When she turned around cat bit her through her clothes  Leg left    Dark grey cat  And put food down  And bit from behind   Through pants Cleaned it not that pain ful  Had rabies vaccine in past   Over 5 years  [place] .    Not too painful.    ROS: See pertinent positives and negatives per HPI. Feels ok otherwise   Past Medical History  Diagnosis Date  . Hypothyroidism     post thyroidectomy  . Osteopenia     t -2.5 hip in 2005 by [facility]  . Osteoporosis     commpr fx after dog related fall  injury  . Seasonal rhinitis     spring and fall  . Skin cancer     squamous cell ca chest  . Elevated lipids   . Vaginal prolapse     hs corrected  . Dog bite of thigh 04/09/2012    Family History  Problem Relation Age of Onset  . Heart attack Mother   . Alcohol abuse Mother   . Alcohol abuse Other   . Kidney disease Other   . Cancer Other     kidne  . Heart disease Other     Social History   Social History  . Marital Status: Married    Spouse Name: N/A  . Number of Children: N/A  . Years of Education: N/A   Social History Main Topics  . Smoking status: Never Smoker   . Smokeless tobacco: None  . Alcohol Use: 0.6 oz/week    1 Glasses of wine per week     Comment: occasionally  . Drug Use: No  . Sexual Activity: Not Asked   Other Topics Concern  . 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   Outpatient Prescriptions Prior to Visit  Medication Sig Dispense Refill  . B  Complex-C (B-COMPLEX WITH VITAMIN C) tablet Take 1 tablet by mouth every morning.    . cholecalciferol (VITAMIN D) 1000 UNITS tablet Take 1,000 Units by mouth every morning.     Marland Kitchen MINIVELLE 0.0375 MG/24HR Place 1 patch onto the skin 2 times a week  5  . risedronate (ACTONEL) 150 MG tablet Take 150 mg by mouth every 30 (thirty) days. with water on empty stomach, nothing by mouth or lie down for next 30 minutes.     Marland Kitchen SYNTHROID 88 MCG tablet TAKE 1 TABLET(88 MCG) BY MOUTH DAILY BEFORE BREAKFAST 30 tablet 5  . UNABLE TO FIND Med Name: [name] 0.3mg     . azithromycin (ZITHROMAX Z-PAK) 250 MG tablet Take 2 tabs today; then begin one tab once daily for 4 more days. (Rx void after 07/08/15) 6 tablet 0     EXAM:  BP 130/82 mmHg  Pulse 79  Temp(Src) 97.6 F (36.4  C) (Oral)  Ht 5\' 1"  (1.549 m)  Wt 168 lb (76.204 kg)  BMI 31.76 kg/m2  SpO2 98%  Body mass index is 31.76 kg/(m^2).  GENERAL: vitals reviewed and listed above, alert, oriented, appears well hydrated and in no acute distress HEENT: atraumatic, conjunctiva  clear, no obvious abnormalities on inspection of external nose and ears MS: moves all extremities without noticeable focal  Abnormality lle  Lower calf superficial  Marks  irreg   Dried blood  No erythema or streaking  PSYCH: pleasant and cooperative, no obvious depression or anxiety  ASSESSMENT AND PLAN:  Discussed the following assessment and plan:  Cat bite, initial encounter - unknown cat feral kitten see text.    Encounter for immunization - Plan: Tdap (BOOSTRIX) injection 0.5 mL Local antibiotic prophylaxis and td update   To emergent UC to get rabies  Vaccine booster    Expectant management.  -Patient advised to return or notify health care team  if symptoms worsen ,persist or new concerns arise.  Patient Instructions  Antibiotic prophylaxis and td update.  Because  A stray cat and not observable   caniddate for rabies vaccine booster  Since you have had  Prev rabies  vaccine years ago you may only need 2 series .     Animal Bite Animal bites can range from mild to serious. An animal bite can result in a scratch on the skin, a deep open cut, a puncture of the skin, a crush injury, or tearing away of the skin or a body part. A small bite from a house pet will usually not cause serious problems. However, some animal bites can become infected or injure a bone or other tissue.  Bites from certain animals can be more dangerous because of the risk of spreading rabies, which is a serious viral infection. This risk is higher with bites from stray animals or wild animals, such as raccoons, foxes, skunks, and bats. Dogs are responsible for most animal bites. Children are bitten more often than adults. SYMPTOMS  Common symptoms of an animal bite include:   Pain.   Bleeding.   Swelling.   Bruising.  DIAGNOSIS  This condition may be diagnosed based on a physical exam and medical history. Your health care provider will examine the wound and ask for details about the animal and how the bite happened. You may also have tests, such as:   Blood tests to check for infection or to determine if surgery is needed.  X-rays to check for damage to bones or joints.  Culture test. This uses a sample of fluid from the wound to check for infection. TREATMENT  Treatment varies depending on the location and type of animal bite and your medical history. Treatment may include:   Wound care. This often includes cleaning the wound, flushing the wound with saline solution, and applying a bandage (dressing). Sometimes, the wound is left open to heal because of the high risk of infection. However, in some cases, the wound may be closed with stitches (sutures), staples, skin glue, or adhesive strips.   Antibiotic medicine.   Tetanus shot.   Rabies treatment if the animal could have rabies.  In some cases, bites that have become infected may require IV antibiotics and surgical  treatment in the hospital.  [place]  Follow instructions from your health care provider about how to take care of your wound. Make sure you:  Wash your hands with soap and water before you change  your dressing. If soap and water are not available, use hand sanitizer.  Change your dressing as told by your health care provider.  Leave sutures, skin glue, or adhesive strips in place. These skin closures may need to be in place for 2 weeks or longer. If adhesive strip edges start to loosen and curl up, you may trim the loose edges. Do not remove adhesive strips completely unless your health care provider tells you to do that.  Check your wound every day for signs of infection. Watch for:   Increasing redness, swelling, or pain.   Fluid, blood, or pus.  General Instructions  Take or apply over-the-counter and prescription medicines only as told by your health care provider.   If you were prescribed an antibiotic, take or apply it as told by your health care provider. Do not stop using the antibiotic even if your condition improves.   Keep the injured area raised (elevated) above the level of your heart while you are sitting or lying down, if this is possible.   If directed, apply ice to the injured area.   Put ice in a plastic bag.   Place a towel between your skin and the bag.   Leave the ice on for 20 minutes, 2-3 times per day.   Keep all follow-up visits as told by your health care provider. This is important.  SEEK MEDICAL CARE IF:  You have increasing redness, swelling, or pain at the site of your wound.   You have a general feeling of sickness (malaise).   You feel nauseous or you vomit.   You have pain that does not get better.  SEEK IMMEDIATE MEDICAL CARE IF:  You have a red streak extending away from your wound.   You have fluid, blood, or pus coming from your wound.   You have a fever or chills.   You have  trouble moving your injured area.   You have numbness or tingling extending beyond the wound.   This information is not intended to replace advice given to you by your health care provider. Make sure you discuss any questions you have with your health care provider.   Document Released: 03/01/2011 Document Revised: 03/04/2015 Document Reviewed: 10/29/2014 Elsevier Interactive Patient Education 2016 [place]. [place] M.D.

## 2015-11-11 NOTE — Patient Instructions (Addendum)
Antibiotic prophylaxis and td update.  Because  A stray cat and not observable   caniddate for rabies vaccine booster  Since you have had  Prev rabies vaccine years ago you may only need 2 series .     Animal Bite Animal bites can range from mild to serious. An animal bite can result in a scratch on the skin, a deep open cut, a puncture of the skin, a crush injury, or tearing away of the skin or a body part. A small bite from a house pet will usually not cause serious problems. However, some animal bites can become infected or injure a bone or other tissue.  Bites from certain animals can be more dangerous because of the risk of spreading rabies, which is a serious viral infection. This risk is higher with bites from stray animals or wild animals, such as raccoons, foxes, skunks, and bats. Dogs are responsible for most animal bites. Children are bitten more often than adults. SYMPTOMS  Common symptoms of an animal bite include:   Pain.   Bleeding.   Swelling.   Bruising.  DIAGNOSIS  This condition may be diagnosed based on a physical exam and medical history. Your health care provider will examine the wound and ask for details about the animal and how the bite happened. You may also have tests, such as:   Blood tests to check for infection or to determine if surgery is needed.  X-rays to check for damage to bones or joints.  Culture test. This uses a sample of fluid from the wound to check for infection. TREATMENT  Treatment varies depending on the location and type of animal bite and your medical history. Treatment may include:   Wound care. This often includes cleaning the wound, flushing the wound with saline solution, and applying a bandage (dressing). Sometimes, the wound is left open to heal because of the high risk of infection. However, in some cases, the wound may be closed with stitches (sutures), staples, skin glue, or adhesive strips.   Antibiotic medicine.    Tetanus shot.   Rabies treatment if the animal could have rabies.  In some cases, bites that have become infected may require IV antibiotics and surgical treatment in the hospital.  [place]  Follow instructions from your health care provider about how to take care of your wound. Make sure you:  Wash your hands with soap and water before you change your dressing. If soap and water are not available, use hand sanitizer.  Change your dressing as told by your health care provider.  Leave sutures, skin glue, or adhesive strips in place. These skin closures may need to be in place for 2 weeks or longer. If adhesive strip edges start to loosen and curl up, you may trim the loose edges. Do not remove adhesive strips completely unless your health care provider tells you to do that.  Check your wound every day for signs of infection. Watch for:   Increasing redness, swelling, or pain.   Fluid, blood, or pus.  General Instructions  Take or apply over-the-counter and prescription medicines only as told by your health care provider.   If you were prescribed an antibiotic, take or apply it as told by your health care provider. Do not stop using the antibiotic even if your condition improves.   Keep the injured area raised (elevated) above the level of your heart while you are sitting or lying down, if this is possible.  If directed, apply ice to the injured area.   Put ice in a plastic bag.   Place a towel between your skin and the bag.   Leave the ice on for 20 minutes, 2-3 times per day.   Keep all follow-up visits as told by your health care provider. This is important.  SEEK MEDICAL CARE IF:  You have increasing redness, swelling, or pain at the site of your wound.   You have a general feeling of sickness (malaise).   You feel nauseous or you vomit.   You have pain that does not get better.  SEEK IMMEDIATE MEDICAL CARE  IF:  You have a red streak extending away from your wound.   You have fluid, blood, or pus coming from your wound.   You have a fever or chills.   You have trouble moving your injured area.   You have numbness or tingling extending beyond the wound.   This information is not intended to replace advice given to you by your health care provider. Make sure you discuss any questions you have with your health care provider.   Document Released: 03/01/2011 Document Revised: 03/04/2015 Document Reviewed: 10/29/2014 Elsevier Interactive Patient Education [employer].

## 2015-11-11 NOTE — ED Provider Notes (Signed)
CSN: [id number]     Arrival date & time 11/11/15  1313 History  By signing my name below, I, Nicole Kindred, attest that this documentation has been prepared under the direction and in the presence of [address] [employer].   Electronically Signed: Nicole Kindred, ED Scribe 11/11/2015 at 1:42 PM.  Chief Complaint  Patient presents with  . Animal Bite    Patient is a 75 y.o. female presenting with animal bite. The history is provided by the patient. No language interpreter was used.  Animal Bite Contact animal:  Cat Location:  Leg Leg injury location:  L lower leg Time since incident:  1 day Pain details:    Severity:  No pain Incident location:  Outside Provoked: unprovoked   Notifications:  None Animal's rabies vaccination status:  Unknown Animal in possession: no   Tetanus status:  Up to date Relieved by:  None tried Worsened by:  Nothing tried Ineffective treatments:  None tried Associated symptoms: no fever, no numbness, no rash and no swelling    HPI Comments: [name] [name] is a 75 y.o. female with a PMHx of hypothyroidism, osteoporosis, and HLD, who presents to the Emergency Department complaining of sudden onset, unprovoked, feral cat bite to lower left leg, onset yesterday. Animal control was not contacted and she does not have the cat in her possession. Pt was already seen by PCP and received a tetanus shot and antibiotics today. Pt was sent to [facility] but was told to come to ED for rabies vaccinations. No other associated symptoms. No other worsening or alleviating factors. Pt denies pain to the area, erythema, drainage, warmth, swelling, fevers, chills, chest pain, shortness of breath, abdominal pain, nausea, vomiting, diarrhea, constipation, dysuria, hematuria, numbness, tingling, weakness, body aches, rash or any other pertinent symptoms.   Past Medical History  Diagnosis Date  . Hypothyroidism     post thyroidectomy  . Osteopenia     t -2.5 hip in 2005 by [facility]  .  Osteoporosis     commpr fx after dog related fall  injury  . Seasonal rhinitis     spring and fall  . Skin cancer     squamous cell ca chest  . Elevated lipids   . Vaginal prolapse     hs corrected  . Dog bite of thigh 04/09/2012   Past Surgical History  Procedure Laterality Date  . Thyroidectomy    . Colonoscopy  2006  . Abdominal hysterectomy  07/2007  . Bladders sling surgery  07/2007   Family History  Problem Relation Age of Onset  . Heart attack Mother   . Alcohol abuse Mother   . Alcohol abuse Other   . Kidney disease Other   . Cancer Other     kidne  . Heart disease Other    Social History  Substance Use Topics  . Smoking status: Never Smoker   . Smokeless tobacco: None  . Alcohol Use: 0.6 oz/week    1 Glasses of wine per week     Comment: occasionally   OB History    Gravida Para Term Preterm AB TAB SAB Ectopic Multiple Living   1 1             Review of Systems  Constitutional: Negative for fever and chills.  Respiratory: Negative for shortness of breath.   Cardiovascular: Negative for chest pain.  Gastrointestinal: Negative for nausea, vomiting, abdominal pain, diarrhea and constipation.  Genitourinary: Negative for dysuria and hematuria.  Musculoskeletal: Negative for myalgias,  joint swelling and arthralgias.  Skin: Positive for wound. Negative for color change and rash.  Allergic/Immunologic: Negative for immunocompromised state.  Neurological: Negative for weakness and numbness.  Psychiatric/Behavioral: Negative for confusion.   A complete 10 system review of systems was obtained and all systems are negative except as noted in the HPI and PMH.   Allergies  Review of patient's allergies indicates no known allergies.  Home Medications   Prior to Admission medications   Medication Sig Start Date End Date Taking? Authorizing Provider  amoxicillin-clavulanate (AUGMENTIN) 875-125 MG tablet Take 1 tablet by mouth every 12 (twelve) hours. 11/11/15    Burnis Medin, MD  B Complex-C (B-COMPLEX WITH VITAMIN C) tablet Take 1 tablet by mouth every morning.    Historical Provider, MD  cholecalciferol (VITAMIN D) 1000 UNITS tablet Take 1,000 Units by mouth every morning.     Historical Provider, MD  MINIVELLE 0.0375 MG/24HR Place 1 patch onto the skin 2 times a week 10/14/14   Historical Provider, MD  risedronate (ACTONEL) 150 MG tablet Take 150 mg by mouth every 30 (thirty) days. with water on empty stomach, nothing by mouth or lie down for next 30 minutes.     Historical Provider, MD  SYNTHROID 88 MCG tablet TAKE 1 TABLET(88 MCG) BY MOUTH DAILY BEFORE BREAKFAST 03/05/15   Burnis Medin, MD  UNABLE TO FIND Med Name: [name] 0.3mg     Historical Provider, MD   BP 154/95 mmHg  Pulse 85  Temp(Src) 97.8 F (36.6 C) (Oral)  Resp 18  SpO2 94% Physical Exam  Constitutional: She is oriented to person, place, and time. Vital signs are normal. She appears well-developed and well-nourished.  Non-toxic appearance. No distress.  Afebrile, nontoxic, NAD  HENT:  Head: Normocephalic and atraumatic.  Mouth/Throat: Mucous membranes are normal.  Eyes: Conjunctivae and EOM are normal. Right eye exhibits no discharge. Left eye exhibits no discharge.  Neck: Normal range of motion. Neck supple.  Cardiovascular: Normal rate and intact distal pulses.   Pulmonary/Chest: Effort normal. No respiratory distress.  Abdominal: Normal appearance. She exhibits no distension.  Musculoskeletal: Normal range of motion.  MAE x4 Strength and sensation grossly intact Distal pulses intact Gait steady Left lower leg with very small abrasion as pictured below, no surrounding erythema or warmth, no drainage or swelling. FROM intact.   Neurological: She is alert and oriented to person, place, and time. She has normal strength. No sensory deficit.  Skin: Skin is warm and dry. Abrasion noted. No rash noted.  Left lower leg abrasion as noted above and pictured below.   Psychiatric:  She has a normal mood and affect. Her behavior is normal.  Nursing note and vitals reviewed.     ED Course  Procedures (including critical care time) DIAGNOSTIC STUDIES: Oxygen Saturation is 94% on RA, normal by my interpretation.    COORDINATION OF CARE: 1:42 PM Discussed treatment plan with pt at bedside and pt agreed to plan.  Labs Review Labs Reviewed - No data to display  Imaging Review No results found.   EKG Interpretation None      MDM   Final diagnoses:  Animal bite of lower leg, left, initial encounter  Need for rabies vaccination    75 y.o. female here for rabies vaccines, was bit by feral cat, seen by PCP today and got tetanus updated and abx given. Just needs rabies vaccines. Skin abrasion noted to LLE, no surrounding cellulitis or signs of infection. Will start rabies since cat isn't  in the possession of animal control. Will have her f/up with [facility] for ongoing rabies vaccinations. Extremities NVI with soft compartments, no need for further work up at this time. I explained the diagnosis and have given explicit precautions to return to the ER including for any other new or worsening symptoms. The patient understands and accepts the medical plan as it's been dictated and I have answered their questions. Discharge instructions concerning home care and prescriptions have been given. The patient is STABLE and is discharged to home in good condition.   I personally performed the services described in this documentation, which was scribed in my presence. The recorded information has been reviewed and is accurate.  BP 154/95 mmHg  Pulse 85  Temp(Src) 97.8 F (36.6 C) (Oral)  Resp 18  SpO2 94%  Meds ordered this encounter  Medications  . rabies immune globulin (HYPERAB) injection 1,500 Units    Sig:   . rabies vaccine,human diploid (IMOVAX) injection 1 mL    Sig:         Mishaal Lansdale Camprubi-Soms, PA-C 11/11/15 [place], MD 11/11/15 1430

## 2015-11-16 NOTE — Progress Notes (Signed)
Document opened and reviewed for OV but appt  canceled same day . Seen alter

## 2015-11-17 ENCOUNTER — Encounter: Payer: Self-pay | Admitting: Internal Medicine

## 2015-11-17 ENCOUNTER — Encounter: Payer: Medicare Other | Admitting: Internal Medicine

## 2015-11-17 ENCOUNTER — Ambulatory Visit (INDEPENDENT_AMBULATORY_CARE_PROVIDER_SITE_OTHER): Payer: Medicare Other | Admitting: Internal Medicine

## 2015-11-17 VITALS — BP 132/84 | HR 67 | Temp 97.8°F | Ht 61.0 in | Wt 169.0 lb

## 2015-11-17 DIAGNOSIS — R12 Heartburn: Secondary | ICD-10-CM

## 2015-11-17 DIAGNOSIS — K219 Gastro-esophageal reflux disease without esophagitis: Secondary | ICD-10-CM

## 2015-11-17 DIAGNOSIS — Z79899 Other long term (current) drug therapy: Secondary | ICD-10-CM

## 2015-11-17 DIAGNOSIS — M810 Age-related osteoporosis without current pathological fracture: Secondary | ICD-10-CM | POA: Diagnosis not present

## 2015-11-17 DIAGNOSIS — E039 Hypothyroidism, unspecified: Secondary | ICD-10-CM

## 2015-11-17 LAB — HEPATIC FUNCTION PANEL
ALT: 19 U/L (ref 0–35)
AST: 29 U/L (ref 0–37)
Albumin: 4.5 g/dL (ref 3.5–5.2)
Alkaline Phosphatase: 37 U/L — ABNORMAL LOW (ref 39–117)
BILIRUBIN DIRECT: 0.2 mg/dL (ref 0.0–0.3)
BILIRUBIN TOTAL: 1.6 mg/dL — AB (ref 0.2–1.2)
Total Protein: 6.7 g/dL (ref 6.0–8.3)

## 2015-11-17 LAB — CBC WITH DIFFERENTIAL/PLATELET
Basophils Absolute: 0 10*3/uL (ref 0.0–0.1)
Basophils Relative: 0.2 % (ref 0.0–3.0)
Eosinophils Absolute: 0.6 10*3/uL (ref 0.0–0.7)
Eosinophils Relative: 8.2 % — ABNORMAL HIGH (ref 0.0–5.0)
HCT: 40 % (ref 36.0–46.0)
Hemoglobin: 13.3 g/dL (ref 12.0–15.0)
Lymphocytes Relative: 36.4 % (ref 12.0–46.0)
Lymphs Abs: 2.9 10*3/uL (ref 0.7–4.0)
MCHC: 33.3 g/dL (ref 30.0–36.0)
MCV: 93.6 fl (ref 78.0–100.0)
Monocytes Absolute: 0.6 10*3/uL (ref 0.1–1.0)
Monocytes Relative: 7.7 % (ref 3.0–12.0)
Neutro Abs: 3.7 10*3/uL (ref 1.4–7.7)
Neutrophils Relative %: 47.5 % (ref 43.0–77.0)
Platelets: 292 10*3/uL (ref 150.0–400.0)
RBC: 4.28 Mil/uL (ref 3.87–5.11)
RDW: 14.3 % (ref 11.5–15.5)
WBC: 7.8 10*3/uL (ref 4.0–10.5)

## 2015-11-17 LAB — BASIC METABOLIC PANEL
BUN: 17 mg/dL (ref 6–23)
CO2: 28 mEq/L (ref 19–32)
Calcium: 9.8 mg/dL (ref 8.4–10.5)
Chloride: 107 mEq/L (ref 96–112)
Creatinine, Ser: 0.77 mg/dL (ref 0.40–1.20)
GFR: 77.83 mL/min (ref >60.00)
Glucose, Bld: 83 mg/dL (ref 70–99)
Potassium: 4 mEq/L (ref 3.5–5.1)
Sodium: 141 mEq/L (ref 135–145)

## 2015-11-17 LAB — VITAMIN D 25 HYDROXY (VIT D DEFICIENCY, FRACTURES): VITD: 71.52 ng/mL (ref 30.00–100.00)

## 2015-11-17 LAB — TSH: TSH: 4.94 u[IU]/mL — AB (ref 0.35–4.50)

## 2015-11-17 MED ORDER — PANTOPRAZOLE SODIUM 40 MG PO TBEC: 40.0000 mg | DELAYED_RELEASE_TABLET | Freq: Every day | ORAL | Status: DC

## 2015-11-17 NOTE — Progress Notes (Signed)
Chief Complaint  Patient presents with  . Gastroesophageal Reflux    pt would like a referal to GI    HPI: [name] [name] 75 y.o.  [name]  comes in today for SDA for  new problem evaluation.    Problem ofr over 6 months and  Getting worse   burpingin chest      Some foods worse thatn others     .  When burning  Down esophagus and sometimes up in mouth  And then abd pain and gas.   Increase nocturnal 150 as needed.  taking  Zantac 150 mg and mild help.  No dysphagia   And   Feels up high . sometimes med helps    Not taking regularly . burping helps.   On actonel for year.    Says never had problem with this pr caught food .  No vomiting weight changes    ROS: See pertinent positives and negatives per HPI. finsihicng up rabies injec   No problem with the antibiotic  Can she do labs today because of  Fastin state and had to reschedule her appt  Past Medical History  Diagnosis Date  . Hypothyroidism     post thyroidectomy  . Osteopenia     t -2.5 hip in 2005 by Dexa  . Osteoporosis     commpr fx after dog related fall  injury  . Seasonal rhinitis     spring and fall  . Skin cancer     squamous cell ca chest  . Elevated lipids   . Vaginal prolapse     hs corrected  . Dog bite of thigh 04/09/2012    Family History  Problem Relation Age of Onset  . Heart attack Mother   . Alcohol abuse Mother   . Alcohol abuse Other   . Kidney disease Other   . Cancer Other     kidne  . Heart disease Other     Social History   Social History  . Marital Status: Married    Spouse Name: N/A  . Number of Children: N/A  . Years of Education: N/A   Social History Main Topics  . Smoking status: Never Smoker   . Smokeless tobacco: None  . Alcohol Use: 0.6 oz/week    1 Glasses of wine per week     Comment: occasionally  . Drug Use: No  . Sexual Activity: Not Asked   Other Topics Concern  . 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 B Complex-C (B-COMPLEX WITH VITAMIN C) tablet Take 1 tablet by mouth every morning.    . cholecalciferol (VITAMIN D) 1000 UNITS tablet Take 1,000 Units by mouth every morning.     Marland Kitchen MINIVELLE 0.0375 MG/24HR Place 1 patch onto the skin 2 times a week  5  . risedronate (ACTONEL) 150 MG tablet Take 150 mg by mouth every 30 (thirty) days. with water on empty stomach, nothing by mouth or lie down for next 30 minutes.     Marland Kitchen SYNTHROID 88 MCG tablet TAKE 1 TABLET(88 MCG) BY MOUTH DAILY BEFORE BREAKFAST 30 tablet 5  . UNABLE TO FIND Med Name: [name] 0.3mg     .  amoxicillin-clavulanate (AUGMENTIN) 875-125 MG tablet Take 1 tablet by mouth every 12 (twelve) hours. 14 tablet 0   No facility-administered medications prior to visit.     EXAM:  BP 132/84 mmHg  Pulse 67  Temp(Src) 97.8 F (36.6 C) (Oral)  Ht 5\' 1"  (1.549 m)  Wt 169 lb (76.658 kg)  BMI 31.95 kg/m2  SpO2 96%  Body mass index is 31.95 kg/(m^2).  GENERAL: vitals reviewed and listed above, alert, oriented, appears well hydrated and in no acute distress HEENT: atraumatic, conjunctiva  clear, no obvious abnormalities on inspection of external nose and ears OP : no lesion edema or exudate  NECK: no obvious masses on inspection palpation  LUNGS: clear to auscultation bilaterally, no wheezes, rales or rhonchi, good air movement kyphosis  CV: HRRR, no clubbing cyanosis or  peripheral edema nl cap refill  Abdomen:  Sof,t normal bowel sounds without hepatosplenomegaly, no guarding rebound or masses no CVA tenderness MS: moves all extremities without noticeable focal  Abnormality DJD changes  PSYCH: pleasant and cooperative, no obvious depression or anxiety   ASSESSMENT AND PLAN:  Discussed the following assessment and plan:  Gastroesophageal reflux disease,  esophagitis presence not specified - newer onset older age   add short term ppi and transitionto ranitidine lsi refer to gi hold actonel for now? if need for endo or med change - Plan: Ambulatory referral to Gastroenterology  Heartburn - Plan: Basic metabolic panel, CBC with Differential/Platelet, Hepatic function panel, TSH  Osteoporosis - Plan: Basic metabolic panel, CBC with Differential/Platelet, Hepatic function panel, TSH, VITAMIN D 25 Hydroxy (Vit-D Deficiency, Fractures)  Hypothyroidism, unspecified hypothyroidism type - Plan: Basic metabolic panel, CBC with Differential/Platelet, Hepatic function panel, TSH  Medication management - Plan: Basic metabolic panel, CBC with Differential/Platelet, Hepatic function panel, TSH, Ambulatory referral to Gastroenterology  -Patient advised to return or notify health care team  if symptoms worsen ,persist or new concerns arise.  Patient Instructions  Begin a limited course of  PPI protonix  40 mg per day   For 3-4 weeks   If well may change over to  Ranitidine 150 mg twice a day  Do not take  Next months actonel  Until gi approval .  Will be contacted about   Gi opinion.      Food Choices for Gastroesophageal Reflux Disease, Adult When you have gastroesophageal reflux disease (GERD), the foods you eat and your eating habits are very important. Choosing the right foods can help ease the discomfort of GERD. WHAT GENERAL GUIDELINES DO I NEED TO FOLLOW?  Choose fruits, vegetables, whole grains, low-fat dairy products, and low-fat meat, fish, and poultry.  Limit fats such as oils, salad dressings, butter, nuts, and avocado.  Keep a food diary to identify foods that cause symptoms.  Avoid foods that cause reflux. These may be different for different people.  Eat frequent small meals instead of three large meals each day.  Eat your meals slowly, in a relaxed setting.  Limit fried foods.  Cook foods using methods other than  frying.  Avoid drinking alcohol.  Avoid drinking large amounts of liquids with your meals.  Avoid bending over or lying down until 2-3 hours after eating. WHAT FOODS ARE NOT RECOMMENDED? The following are some foods and drinks that may worsen your symptoms: Vegetables Tomatoes. Tomato juice. Tomato and spaghetti sauce. Chili peppers. Onion and garlic. Horseradish. Fruits Oranges, grapefruit, and lemon (fruit and juice). Meats High-fat meats, fish, and poultry. This includes hot dogs, ribs, ham, sausage,  salami, and bacon. Dairy Whole milk and chocolate milk. Sour cream. Cream. Butter. Ice cream. Cream cheese.  Beverages Coffee and tea, with or without caffeine. Carbonated beverages or energy drinks. Condiments Hot sauce. Barbecue sauce.  Sweets/Desserts Chocolate and cocoa. Donuts. Peppermint and spearmint. Fats and Oils High-fat foods, including [place] fries and potato chips. Other Vinegar. Strong spices, such as black pepper, white pepper, red pepper, cayenne, curry powder, cloves, ginger, and chili powder. The items listed above may not be a complete list of foods and beverages to avoid. Contact your dietitian for more information.   This information is not intended to replace advice given to you by your health care provider. Make sure you discuss any questions you have with your health care provider.   Document Released: 06/13/2005 Document Revised: 07/04/2014 Document Reviewed: 04/17/2013 Elsevier Interactive Patient Education 2016 [place] K. Panosh M.D.

## 2015-11-17 NOTE — Progress Notes (Signed)
Pre visit review using our clinic review tool, if applicable. No additional management support is needed unless otherwise documented below in the visit note. 

## 2015-11-17 NOTE — Patient Instructions (Signed)
Begin a limited course of  PPI protonix  40 mg per day   For 3-4 weeks   If well may change over to  Ranitidine 150 mg twice a day  Do not take  Next months actonel  Until gi approval .  Will be contacted about   Gi opinion.      Food Choices for Gastroesophageal Reflux Disease, Adult When you have gastroesophageal reflux disease (GERD), the foods you eat and your eating habits are very important. Choosing the right foods can help ease the discomfort of GERD. WHAT GENERAL GUIDELINES DO I NEED TO FOLLOW?  Choose fruits, vegetables, whole grains, low-fat dairy products, and low-fat meat, fish, and poultry.  Limit fats such as oils, salad dressings, butter, nuts, and avocado.  Keep a food diary to identify foods that cause symptoms.  Avoid foods that cause reflux. These may be different for different people.  Eat frequent small meals instead of three large meals each day.  Eat your meals slowly, in a relaxed setting.  Limit fried foods.  Cook foods using methods other than frying.  Avoid drinking alcohol.  Avoid drinking large amounts of liquids with your meals.  Avoid bending over or lying down until 2-3 hours after eating. WHAT FOODS ARE NOT RECOMMENDED? The following are some foods and drinks that may worsen your symptoms: Vegetables Tomatoes. Tomato juice. Tomato and spaghetti sauce. Chili peppers. Onion and garlic. Horseradish. Fruits Oranges, grapefruit, and lemon (fruit and juice). Meats High-fat meats, fish, and poultry. This includes hot dogs, ribs, ham, sausage, salami, and bacon. Dairy Whole milk and chocolate milk. Sour cream. Cream. Butter. Ice cream. Cream cheese.  Beverages Coffee and tea, with or without caffeine. Carbonated beverages or energy drinks. Condiments Hot sauce. Barbecue sauce.  Sweets/Desserts Chocolate and cocoa. Donuts. Peppermint and spearmint. Fats and Oils High-fat foods, including [place] fries and potato chips. Other Vinegar. Strong  spices, such as black pepper, white pepper, red pepper, cayenne, curry powder, cloves, ginger, and chili powder. The items listed above may not be a complete list of foods and beverages to avoid. Contact your dietitian for more information.   This information is not intended to replace advice given to you by your health care provider. Make sure you discuss any questions you have with your health care provider.   Document Released: 06/13/2005 Document Revised: 07/04/2014 Document Reviewed: 04/17/2013 Elsevier Interactive Patient Education [employer].

## 2015-11-18 ENCOUNTER — Encounter: Payer: Self-pay | Admitting: Internal Medicine

## 2015-11-18 ENCOUNTER — Encounter (HOSPITAL_COMMUNITY): Payer: Self-pay | Admitting: *Deleted

## 2015-11-18 ENCOUNTER — Ambulatory Visit (HOSPITAL_COMMUNITY)
Admission: EM | Admit: 2015-11-18 | Discharge: 2015-11-18 | Disposition: A | Discharge status: Home / Self Care | Payer: Medicare Other | Attending: Family Medicine | Admitting: Family Medicine

## 2015-11-18 DIAGNOSIS — Z2914 Encounter for prophylactic rabies immune globin: Secondary | ICD-10-CM | POA: Diagnosis not present

## 2015-11-18 DIAGNOSIS — Z203 Contact with and (suspected) exposure to rabies: Secondary | ICD-10-CM | POA: Diagnosis not present

## 2015-11-18 DIAGNOSIS — Z23 Encounter for immunization: Secondary | ICD-10-CM | POA: Diagnosis not present

## 2015-11-18 MED ORDER — RABIES VIRUS VACCINE, HDC IM INJ
1.0000 mL | INJECTION | Freq: Once | INTRAMUSCULAR | Status: AC
  Administered 2015-11-18: 1 mL via INTRAMUSCULAR

## 2015-11-18 MED ORDER — RABIES VIRUS VACCINE, HDC IM INJ
INJECTION | INTRAMUSCULAR | Status: AC
  Filled 2015-11-18: qty 1

## 2015-11-18 NOTE — ED Provider Notes (Signed)
CSN: [id number]     Arrival date & time 11/18/15  1304 History   First MD Initiated Contact with Patient 11/18/15 1339     Chief Complaint  Patient presents with  . Rabies Injection   (Consider location/radiation/quality/duration/timing/severity/associated sxs/prior Treatment) HPI History obtained from patient:  Pt presents with the cc of:  In need of rabies injection Duration of symptoms: One week Treatment prior to arrival: Rabies series started in the emergency department Context: Scratched by her cat was unable to capture so rabies series has been started Other symptoms include: None Pain score: 0 FAMILY HISTORY: Heart disease and alcohol abuse and family    Past Medical History  Diagnosis Date  . Hypothyroidism     post thyroidectomy  . Osteopenia     t -2.5 hip in 2005 by Dexa  . Osteoporosis     commpr fx after dog related fall  injury  . Seasonal rhinitis     spring and fall  . Skin cancer     squamous cell ca chest  . Elevated lipids   . Vaginal prolapse     hs corrected  . Dog bite of thigh 04/09/2012   Past Surgical History  Procedure Laterality Date  . Thyroidectomy    . Colonoscopy  2006  . Abdominal hysterectomy  07/2007  . Bladders sling surgery  07/2007   Family History  Problem Relation Age of Onset  . Heart attack Mother   . Alcohol abuse Mother   . Alcohol abuse Other   . Kidney disease Other   . Cancer Other     kidne  . Heart disease Other    Social History  Substance Use Topics  . Smoking status: Never Smoker   . Smokeless tobacco: None  . Alcohol Use: 0.6 oz/week    1 Glasses of wine per week     Comment: occasionally   OB History    Gravida Para Term Preterm AB TAB SAB Ectopic Multiple Living   1 1             Review of Systems  Denies: HEADACHE, NAUSEA, ABDOMINAL PAIN, CHEST PAIN, CONGESTION, DYSURIA, SHORTNESS OF BREATH  Allergies  Review of patient's allergies indicates no known allergies.  Home Medications   Prior to  Admission medications   Medication Sig Start Date End Date Taking? Authorizing Provider  B Complex-C (B-COMPLEX WITH VITAMIN C) tablet Take 1 tablet by mouth every morning.    Historical Provider, MD  cholecalciferol (VITAMIN D) 1000 UNITS tablet Take 1,000 Units by mouth every morning.     Historical Provider, MD  MINIVELLE 0.0375 MG/24HR Place 1 patch onto the skin 2 times a week 10/14/14   Historical Provider, MD  pantoprazole (PROTONIX) 40 MG tablet Take 1 tablet (40 mg total) by mouth daily. 11/17/15   Burnis Medin, MD  risedronate (ACTONEL) 150 MG tablet Take 150 mg by mouth every 30 (thirty) days. with water on empty stomach, nothing by mouth or lie down for next 30 minutes.     Historical Provider, MD  SYNTHROID 88 MCG tablet TAKE 1 TABLET(88 MCG) BY MOUTH DAILY BEFORE BREAKFAST 03/05/15   Burnis Medin, MD  UNABLE TO FIND Med Name: [name] 0.3mg     Historical Provider, MD   Meds Ordered and Administered this Visit   Medications  rabies vaccine,human diploid (IMOVAX) injection 1 mL (1 mL Intramuscular Given 11/18/15 1423)    There were no vitals taken for this visit. No data found.  Physical Exam NURSES NOTES AND VITAL SIGNS REVIEWED. CONSTITUTIONAL: Well developed, well nourished, no acute distress HEENT: normocephalic, atraumatic EYES: Conjunctiva normal NECK:normal ROM, supple, no adenopathy PULMONARY:No respiratory distress, normal effort ABDOMINAL: Soft, ND, NT BS+, No CVAT MUSCULOSKELETAL: Normal ROM of all extremities, there is a healed scratch on the left lower leg. No signs of cellulitis or acute infection SKIN: warm and dry without rash PSYCHIATRIC: Mood and affect, behavior are normal  ED Course  Procedures (including critical care time)  Labs Review Labs Reviewed - No data to display  Imaging Review No results found.   Visual Acuity Review  Right Eye Distance:   Left Eye Distance:   Bilateral Distance:    Right Eye Near:   Left Eye Near:     Bilateral Near:      Total Visit Time:20 MINUTES "GREATER THAN 50% WAS SPENT IN COUNSELING AND COORDINATION OF CARE WITH THE PATIENT" DISCUSSION OF FOLLOW-UP FOR THE REMAINDER OF RABIES IMMUNIZATIONS. ALSO DISCUSSION WITH PATIENT ABOUT HOW TO CORRECT  SCHEDULE FOR HER RABIES. WE will consider this a 3 and patient will have to return a 7 and a 14 which will be May 28 in June 4.  MDM   1. Rabies exposure     Patient is reassured that there are no issues that require transfer to higher level of care at this time or additional tests. Patient is advised to continue home symptomatic treatment. Patient is advised that if there are new or worsening symptoms to attend the emergency department, contact primary care provider, or return to UC. Instructions of care provided discharged home in stable condition.    THIS NOTE WAS GENERATED USING A VOICE RECOGNITION SOFTWARE PROGRAM. ALL REASONABLE EFFORTS  WERE MADE TO PROOFREAD THIS DOCUMENT FOR ACCURACY.  I have verbally reviewed the discharge instructions with the patient. A printed AVS was given to the patient.  All questions were answered prior to discharge.      Konrad Felix, [place] 11/18/15 2043

## 2015-11-18 NOTE — ED Notes (Addendum)
Pt    Here   For   Rabies     Vaccine     For     followup          Pt  Reports       She      Was  Seen  1    Week  Ago  In  [facility]     And   Rabies    Series     Was  Started   But   This  Is  Her  First  Visit       She  Is  utd  On  Her  Tet shot            And  Is  On  Anti  Biotics  animal  Control   Was   Notified

## 2015-11-18 NOTE — Discharge Instructions (Signed)
Rabies °Rabies is a viral infection that can be spread to people from infected animals. The infection affects the brain and central nervous system. Once the disease develops, it almost always causes death. Because of this, when a person is bitten by an animal that may have rabies, treatment to prevent rabies often needs to be started whether or not the animal is known to be infected. Prompt treatment with the rabies vaccine and rabies immune globulin is very effective at preventing the infection from developing in people who have been exposed to the rabies virus. °CAUSES  °Rabies is caused by a virus that lives inside some animals. When a person is bitten by an infected animal, the rabies virus is spread to the person through the infected spit (saliva) of the animal. This virus can be carried by animals such as dogs, cats, skunks, bats, woodchucks, raccoons, coyotes, and foxes. °SYMPTOMS  °By the time symptoms appear, rabies is usually fatal for the person. Common symptoms include: °· Headache. °· Fever. °· Fatigue and weakness. °· Agitation. °· Anxiety. °· Confusion. °· Unusual behavior, such as hyperactivity, fear of water (hydrophobia), or fear of air (aerophobia). °· Hallucinations. °· Insomnia. °· Weakness in the arms or legs. °· Difficulty swallowing. °Most people get sick in 1-3 months after being bitten. This often varies and may depend on the location of the bite. The infection will take less time to develop if the bite occurred closer to the head.  °DIAGNOSIS  °To determine if a person is infected, several tests must be performed, such as: °· A skin biopsy. °· A saliva test. °· A lumbar puncture to remove spinal fluid so it can be examined. °· Blood tests. °TREATMENT  °Treatment to prevent the infection from developing (post-exposure prophylaxis, PEP) is often started before knowing for sure if the person has been exposed to the rabies virus. PEP involves cleaning the wound, giving an antibody injection  (rabies immune globulin), and giving a series of rabies vaccine injections. The series of injections are usually given over a two-week period. If possible, the animal that bit the person will be observed to see if it remains healthy. If the animal has been killed, it can be sent to a state laboratory and examined to see if the animal had rabies. °If a person is bitten by a domestic animal (dog, cat, or ferret) that appears healthy and can be observed to see if it remains healthy, often no further treatment is necessary other than care of the wounds caused by the animal. °Rabies is often a fatal illness once the infection develops in a person. Although a few people who developed rabies have survived after experimental treatment with certain drugs, all these survivors still had severe nervous system problems after the treatment. This is why caregivers use extra caution and begin PEP treatment for people who have been bitten by animals that are possibly infected with rabies.  °HOME CARE INSTRUCTIONS  °If you were bitten by an unknown animal, make sure you know your caregiver's instructions for follow-up. If the animal was sent to a laboratory for examination, ask when the test results will be ready. Make sure you get the test results.  °Take these steps to care for your wound: °· Keep the wound clean, dry, and dressed as directed by your caregiver. °· Keep the injured part elevated as much as possible. °· Do not resume use of the affected area until directed. °· Only take over-the-counter or prescription medicines as directed by your   caregiver. °· Keep all follow-up appointments as directed by your caregiver. °PREVENTION  °To prevent rabies, people need to reduce their risk of having contact with infected animals.  °· Make sure your pets (dogs, cats, ferrets) are vaccinated against rabies. Keep these vaccinations up-to-date as directed by your veterinarian. °· Supervise your pets when they are outside. Keep them away  from wild animals. °· Call your local animal control services to report any stray animals. These animals may not be vaccinated. °· Stay away from stray or wild animals. °· Consider getting the rabies vaccine (preexposure) if you are traveling to an area where rabies is common or if your job or activities involve possible contact with wild or stray animals. Discuss this with your caregiver. °  °This information is not intended to replace advice given to you by your health care provider. Make sure you discuss any questions you have with your health care provider. °  °Document Released: 06/13/2005 Document Revised: 07/04/2014 Document Reviewed: 01/10/2012 °Elsevier Interactive Patient Education ©2016 Elsevier Inc. ° °

## 2015-11-22 ENCOUNTER — Encounter (HOSPITAL_COMMUNITY): Payer: Self-pay | Admitting: Emergency Medicine

## 2015-11-22 ENCOUNTER — Ambulatory Visit (HOSPITAL_COMMUNITY)
Admission: EM | Admit: 2015-11-22 | Discharge: 2015-11-22 | Disposition: A | Discharge status: Home / Self Care | Payer: Medicare Other | Attending: Physician Assistant | Admitting: Physician Assistant

## 2015-11-22 DIAGNOSIS — Z203 Contact with and (suspected) exposure to rabies: Secondary | ICD-10-CM | POA: Diagnosis not present

## 2015-11-22 DIAGNOSIS — Z2914 Encounter for prophylactic rabies immune globin: Secondary | ICD-10-CM

## 2015-11-22 DIAGNOSIS — Z23 Encounter for immunization: Secondary | ICD-10-CM | POA: Diagnosis not present

## 2015-11-22 MED ORDER — RABIES VIRUS VACCINE, HDC IM INJ
INJECTION | INTRAMUSCULAR | Status: AC
  Filled 2015-11-22: qty 1

## 2015-11-22 MED ORDER — RABIES VIRUS VACCINE, HDC IM INJ
1.0000 mL | INJECTION | Freq: Once | INTRAMUSCULAR | Status: AC
  Administered 2015-11-22: 1 mL via INTRAMUSCULAR

## 2015-11-22 NOTE — Discharge Instructions (Signed)
Please return to [facility] on November 29, 2015 for your last vaccination.

## 2015-11-22 NOTE — ED Notes (Signed)
The patient presented to the [facility] with a complaint of needing her 3rd in the series of rabies injections.

## 2015-11-29 ENCOUNTER — Encounter (HOSPITAL_COMMUNITY): Payer: Self-pay | Admitting: Emergency Medicine

## 2015-11-29 ENCOUNTER — Ambulatory Visit (HOSPITAL_COMMUNITY)
Admission: EM | Admit: 2015-11-29 | Discharge: 2015-11-29 | Disposition: A | Discharge status: Home / Self Care | Payer: Medicare Other | Attending: Family Medicine | Admitting: Family Medicine

## 2015-11-29 DIAGNOSIS — Z2914 Encounter for prophylactic rabies immune globin: Secondary | ICD-10-CM | POA: Diagnosis not present

## 2015-11-29 DIAGNOSIS — Z203 Contact with and (suspected) exposure to rabies: Secondary | ICD-10-CM | POA: Diagnosis not present

## 2015-11-29 DIAGNOSIS — Z23 Encounter for immunization: Secondary | ICD-10-CM | POA: Diagnosis not present

## 2015-11-29 MED ORDER — RABIES VIRUS VACCINE, HDC IM INJ
INJECTION | INTRAMUSCULAR | Status: AC
  Filled 2015-11-29: qty 1

## 2015-11-29 MED ORDER — RABIES VIRUS VACCINE, HDC IM INJ
1.0000 mL | INJECTION | Freq: Once | INTRAMUSCULAR | Status: AC
Start: 2015-11-29 — End: 2015-11-29
  Administered 2015-11-29: 1 mL via INTRAMUSCULAR

## 2015-11-29 NOTE — ED Notes (Signed)
The patient presented to the [facility] to have the 4th injection of the rabies series.

## 2015-11-29 NOTE — Discharge Instructions (Signed)
This was your final injection in the rabies series. Please return to the Urgent Care Center or the Emergency Department if any further problems arise.

## 2015-12-09 ENCOUNTER — Emergency Department (INDEPENDENT_AMBULATORY_CARE_PROVIDER_SITE_OTHER)
Admission: EM | Admit: 2015-12-09 | Discharge: 2015-12-09 | Disposition: A | Discharge status: Home / Self Care | Payer: Medicare Other | Source: Home / Self Care

## 2015-12-09 ENCOUNTER — Encounter: Payer: Self-pay | Admitting: Emergency Medicine

## 2015-12-09 DIAGNOSIS — K1370 Unspecified lesions of oral mucosa: Secondary | ICD-10-CM | POA: Diagnosis not present

## 2015-12-09 MED ORDER — FIRST-DUKES MOUTHWASH MT SUSP: OROMUCOSAL | Status: DC

## 2015-12-09 NOTE — ED Notes (Signed)
Pt c/o multiple mouth sores x2 weeks. States they are not improving.

## 2015-12-09 NOTE — Discharge Instructions (Signed)
Oral Ulcers °Oral ulcers are painful, shallow sores around the lining of the mouth. They can affect the gums, the inside of the lips, and the cheeks. (Sores on the outside of the lips and on the face are different.) They typically first occur in school-aged children and teenagers. Oral ulcers may also be called canker sores or cold sores. °CAUSES  °Canker sores and cold sores can be caused by many factors including: °· Infection. °· Injury. °· Sun exposure. °· Medications. °· Emotional stress. °· Food allergies. °· Vitamin deficiencies. °· Toothpastes containing sodium lauryl sulfate. °The herpes virus can be the cause of mouth ulcers. The first infection can be severe and cause 10 or more ulcers on the gums, tongue, and lips with fever and difficulty in swallowing. This infection usually occurs between the ages of 1 and 3 years.  °SYMPTOMS  °The typical sore is about ¼ inch (6 mm) in size and is an oval or round ulcer with red borders. °DIAGNOSIS  °Your caregiver can diagnose simple oral ulcers by examination. Additional testing is usually not required.  °TREATMENT  °Treatment is aimed at pain relief. Generally, oral ulcers resolve by themselves within 1 to 2 weeks without medication and are not contagious unless caused by herpes (and other viruses). Antibiotics are not effective with mouth sores. Avoid direct contact with others until the ulcer is completely healed. See your caregiver for follow-up care as recommended. Also: °· Offer a soft diet. °· Encourage plenty of fluids to prevent dehydration. Popsicles and milk shakes can be helpful. °· Avoid acidic and salty foods and drinks such as orange juice. °· Infants and young children will often refuse to drink because of pain. Using a teaspoon, cup, or syringe to give small amounts of fluids frequently can help prevent dehydration. °· Cold compresses on the face may help reduce pain. °· Pain medication can help control soreness. °· A solution of diphenhydramine  mixed with a liquid antacid can be useful to decrease the soreness of ulcers. Consult a caregiver for the dosing. °· Liquids or ointments with a numbing ingredient may be helpful when used as recommended. °· Older children and teenagers can rinse their mouth with a salt-water mixture (1/2 teaspoon of salt in 8 ounces of water) four times a day. This treatment is uncomfortable but may reduce the time the ulcers are present. °· There are many over-the-counter throat lozenges and medications available for oral ulcers. Their effectiveness has not been studied. °· Consult your medical caregiver prior to using homeopathic treatments for oral ulcers. °SEEK MEDICAL CARE IF:  °· You think your child needs to be seen. °· The pain worsens and you cannot control it. °· There are 4 or more ulcers. °· The lips and gums begin to bleed and crust. °· A single mouth ulcer is near a tooth that is causing a toothache or pain. °· Your child has a fever, swollen face, or swollen glands. °· The ulcers began after starting a medication. °· Mouth ulcers keep reoccurring or last more than 2 weeks. °· You think your child is not taking adequate fluids. °SEEK IMMEDIATE MEDICAL CARE IF:  °· Your child has a high fever. °· Your child is unable to swallow or becomes dehydrated. °· Your child looks or acts very ill. °· An ulcer caused by a chemical your child accidentally put in their mouth. °  °This information is not intended to replace advice given to you by your health care provider. Make sure you discuss any   questions you have with your health care provider. °  °Document Released: 07/21/2004 Document Revised: 07/04/2014 Document Reviewed: 10/29/2014 °Elsevier Interactive Patient Education ©2016 [employer] ° °

## 2015-12-09 NOTE — ED Provider Notes (Signed)
CSN: [id number]     Arrival date & time 12/09/15  1910 History   None    Chief Complaint  Patient presents with  . Mouth Lesions   (Consider location/radiation/quality/duration/timing/severity/associated sxs/prior Treatment) Patient is a 75 y.o. female presenting with mouth sores. The history is provided by the patient. No language interpreter was used.  Mouth Lesions Location:  Buccal mucosa Quality:  Unable to specify Onset quality:  Sudden Severity:  Moderate Progression:  Worsening Chronicity:  New Context: not a change in diet   Relieved by:  Nothing Worsened by:  Nothing tried Ineffective treatments:  None tried Associated symptoms: no fever     Past Medical History  Diagnosis Date  . Hypothyroidism     post thyroidectomy  . Osteopenia     t -2.5 hip in 2005 by Dexa  . Osteoporosis     commpr fx after dog related fall  injury  . Seasonal rhinitis     spring and fall  . Skin cancer     squamous cell ca chest  . Elevated lipids   . Vaginal prolapse     hs corrected  . Dog bite of thigh 04/09/2012   Past Surgical History  Procedure Laterality Date  . Thyroidectomy    . Colonoscopy  2006  . Abdominal hysterectomy  07/2007  . Bladders sling surgery  07/2007   Family History  Problem Relation Age of Onset  . Heart attack Mother   . Alcohol abuse Mother   . Alcohol abuse Other   . Kidney disease Other   . Cancer Other     kidne  . Heart disease Other    Social History  Substance Use Topics  . Smoking status: Never Smoker   . Smokeless tobacco: None  . Alcohol Use: 0.6 oz/week    1 Glasses of wine per week     Comment: occasionally   OB History    Gravida Para Term Preterm AB TAB SAB Ectopic Multiple Living   1 1             Review of Systems  Constitutional: Negative for fever.  HENT: Positive for mouth sores.   All other systems reviewed and are negative.   Allergies  Review of patient's allergies indicates no known allergies.  Home  Medications   Prior to Admission medications   Medication Sig Start Date End Date Taking? Authorizing Provider  B Complex-C (B-COMPLEX WITH VITAMIN C) tablet Take 1 tablet by mouth every morning.    Historical Provider, MD  cholecalciferol (VITAMIN D) 1000 UNITS tablet Take 1,000 Units by mouth every morning.     Historical Provider, MD  Diphenhyd-Hydrocort-Nystatin (FIRST-DUKES MOUTHWASH) SUSP 60ml swish slosh, hold in mouth x 20 seconds then spit 12/09/15   Fransico Meadow, PA-C  MINIVELLE 0.0375 MG/24HR Place 1 patch onto the skin 2 times a week 10/14/14   Historical Provider, MD  pantoprazole (PROTONIX) 40 MG tablet Take 1 tablet (40 mg total) by mouth daily. 11/17/15   Burnis Medin, MD  risedronate (ACTONEL) 150 MG tablet Take 150 mg by mouth every 30 (thirty) days. with water on empty stomach, nothing by mouth or lie down for next 30 minutes.     Historical Provider, MD  SYNTHROID 88 MCG tablet TAKE 1 TABLET(88 MCG) BY MOUTH DAILY BEFORE BREAKFAST 03/05/15   Burnis Medin, MD  UNABLE TO FIND Med Name: [name] 0.3mg     Historical Provider, MD   Meds Ordered and Administered this Visit  Medications - No data to display  BP 138/82 mmHg  Pulse 74  Temp(Src) 98 F (36.7 C) (Oral)  Wt 149 lb (67.586 kg)  SpO2 95% No data found.   Physical Exam  Constitutional: She is oriented to person, place, and time. She appears well-developed and well-nourished.  HENT:  Head: Normocephalic.  Mouth  2 small mucous retenton cyst.    Eyes: EOM are normal.  Neck: Normal range of motion.  Pulmonary/Chest: Effort normal.  Abdominal: She exhibits no distension.  Musculoskeletal: Normal range of motion.  Neurological: She is alert and oriented to person, place, and time.  Psychiatric: She has a normal mood and affect.  Nursing note and vitals reviewed.   ED Course  Procedures (including critical care time)  Labs Review Labs Reviewed - No data to display  Imaging Review No results  found.   Visual Acuity Review  Right Eye Distance:   Left Eye Distance:   Bilateral Distance:    Right Eye Near:   Left Eye Near:    Bilateral Near:         MDM Pt advised to not suck on areas,  [employer] moth wash,  No acidic foods.  See Dr. Regis Bill for recheck in 1 week if areas persist.   These areas appear benign.   1. Mouth lesion    Meds ordered this encounter  Medications  . Diphenhyd-Hydrocort-Nystatin (FIRST-DUKES MOUTHWASH) SUSP    Sig: 54ml swish slosh, hold in mouth x 20 seconds then spit    Dispense:  240 mL    Refill:  0    Order Specific Question:  Supervising Provider    Answer:  Burnett Harry, DAVID [place], PA-C 12/10/15 [phone]

## 2015-12-17 NOTE — Progress Notes (Signed)
Chief Complaint  Patient presents with  . Medicare Wellness    HPI: [name] [name] 75 y.o. comes in today for Preventive Medicare wellness visit . And med management   Since last visit.Has finished her rabies vaccine series. No new problems. Has been taking proton X daily and is really helped her heartburn esophageal symptoms. She continues to take Actonel. She has an appointment with GI in July. She says the Protonix causes some loose stools and no blood weight loss. 1 foot has a bunion and an overriding hammer toe is seen in orthopedic surgeon office to advise that if she was having no pain or dysfunction to not go to surgery. She is taking the equivalent of 4000 5000 units international vitamin D a day on her supplements. She is up-to-date on her mammogram.  Health Maintenance  Topic Date Due  . COLONOSCOPY  06/27/2014  . MAMMOGRAM  07/23/2015  . INFLUENZA VACCINE  01/26/2016  . TETANUS/TDAP  11/10/2025  . DEXA SCAN  Completed  . ZOSTAVAX  Completed  . PNA vac Low Risk Adult  Completed   Health Maintenance Review LIFESTYLE:  TADn [address]  Weight trains   Walking  Sugar beverages: Sleep: 6-7 hours  hh of 3 3 pet dogs   MEDICARE DOCUMENT QUESTIONS  TO SCAN  Hearing: ok  Vision:  No limitations at present . Last eye check UTD  Safety:  Has smoke detector and wears seat belts.  No firearms. No excess sun exposure. Sees dentist regularly.  Falls: n  Advance directive :  Reviewed  Has one.  Memory: Felt to be good  , no concern from her or her family.  Depression: No anhedonia unusual crying or depressive symptoms  Nutrition: Eats well balanced diet; adequate calcium and vitamin D. No swallowing chewing problems.  Injury: no major injuries in the last six months.  Other healthcare providers:  Reviewed today .  Social:  Lives with spouse married. 3 pets   Preventive parameters: up-to-date  Reviewed   ADLS:   There are no problems or need for assistance   driving, feeding, obtaining food, dressing, toileting and bathing, managing money using phone. She is independent.   ROS:  GEN/ HEENT: No fever, significant weight changes sweats headaches vision problems hearing changes, CV/ PULM; No chest pain shortness of breath cough, syncope,edema  change in exercise tolerance. GI /GU: No adominal pain, vomiting, change in bowel habits. No blood in the stool. No significant GU symptoms. SKIN/HEME: ,no acute skin rashes suspicious lesions or bleeding. No lymphadenopathy, nodules, masses.  NEURO/ PSYCH:  No neurologic signs such as weakness numbness. No depression anxiety. IMM/ Allergy: No unusual infections.  Allergy .   REST of 12 system review negative except as per HPI   Past Medical History  Diagnosis Date  . Hypothyroidism     post thyroidectomy  . Osteopenia     t -2.5 hip in 2005 by Dexa  . Osteoporosis     commpr fx after dog related fall  injury  . Seasonal rhinitis     spring and fall  . Skin cancer     squamous cell ca chest  . Elevated lipids   . Vaginal prolapse     hs corrected  . Dog bite of thigh 04/09/2012    Family History  Problem Relation Age of Onset  . Heart attack Mother   . Alcohol abuse Mother   . Alcohol abuse Other   . Kidney disease Other   .  Cancer Other     kidne  . Heart disease Other     Social History   Social History  . Marital Status: Married    Spouse Name: N/A  . Number of Children: N/A  . Years of Education: N/A   Social History Main Topics  . Smoking status: Never Smoker   . Smokeless tobacco: None  . Alcohol Use: 0.6 oz/week    1 Glasses of wine per week     Comment: occasionally  . Drug Use: No  . Sexual Activity: Not Asked   Other Topics Concern  . 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 B Complex-C (B-COMPLEX WITH VITAMIN C) tablet Take 1 tablet by mouth every morning.  Marland Kitchen CALCIUM PO Take 2,000 mg by mouth daily.  . cholecalciferol (VITAMIN D) 1000 UNITS tablet Take 1,000 Units by mouth every morning.   . Diphenhyd-Hydrocort-Nystatin (FIRST-DUKES MOUTHWASH) SUSP 74ml swish slosh, hold in mouth x 20 seconds then spit  . MINIVELLE 0.0375 MG/24HR Place 1 patch onto the skin 2 times a week  . pantoprazole (PROTONIX) 40 MG tablet Take 1 tablet (40 mg total) by mouth daily.  . risedronate (ACTONEL) 150 MG tablet Take 150 mg by mouth every 30 (thirty) days. with water on empty stomach, nothing by mouth or lie down for next 30 minutes.   Marland Kitchen SYNTHROID 88 MCG tablet TAKE 1 TABLET(88 MCG) BY MOUTH DAILY BEFORE BREAKFAST  . [DISCONTINUED] UNABLE TO FIND Med Name: [name] 0.3mg    No facility-administered encounter medications on file as of 12/18/2015.    EXAM:  BP 118/72 mmHg  Temp(Src) 98.1 F (36.7 C) (Oral)  Ht 5' 0.75" (1.543 m)  Wt 166 lb (75.297 kg)  BMI 31.63 kg/m2  Body mass index is 31.63 kg/(m^2).  Physical Exam: Vital signs reviewed [id number] is a well-developed well-nourished alert cooperative   who appears stated age in no acute distress.  HEENT: normocephalic atraumatic , Eyes: PERRL EOM's full, conjunctiva clear, Nares: paten,t no deformity discharge or tenderness., Ears: no deformity EAC's clear TMs with normal landmarks. Mouth: clear OP, no lesions, edema.  Moist mucous membranes. Dentition in adequate repair. NECK: supple without masses, thyromegaly or bruits. CHEST/PULM:  Clear to auscultation and percussion breath sounds equal no wheeze , rales or rhonchi. Mild kyphosis .Breast: normal by inspection . No dimpling, discharge, masses, tenderness or discharge . CV: PMI is nondisplaced, S1 S2 no gallops, murmurs, rubs. Peripheral pulses are full without delay.No JVD .  ABDOMEN: Bowel sounds normal nontender  No guard or rebound, no hepato  splenomegal no CVA tenderness.   Extremtities:  No clubbing cyanosis or edema, no acute joint swelling or redness no focal atrophy djd changes  Nl gait  r bunion and overriding toe  NEURO:  Oriented x3, cranial nerves 3-12 appear to be intact, no obvious focal weakness,gait within normal limits no abnormal reflexes or asymmetrical SKIN: No acute rashes normal turgor, color, no bruising or petechiae. PSYCH: Oriented, good eye contact, no obvious depression anxiety, cognition and judgment appear normal. LN: no cervical axillary inguinal adenopathy No noted deficits in memory, attention, and speech.   Lab Results  Component Value Date   WBC 7.8  11/17/2015   HGB 13.3 11/17/2015   HCT 40.0 11/17/2015   PLT 292.0 11/17/2015   GLUCOSE 83 11/17/2015   CHOL 182 10/27/2014   TRIG 74.0 10/27/2014   HDL 61.70 10/27/2014   LDLDIRECT 134.1 12/27/2006   LDLCALC 106* 10/27/2014   ALT 19 11/17/2015   AST 29 11/17/2015   NA 141 11/17/2015   K 4.0 11/17/2015   CL 107 11/17/2015   CREATININE 0.77 11/17/2015   BUN 17 11/17/2015   CO2 28 11/17/2015   TSH 4.94* 11/17/2015   INR 1.0 07/25/2007    ASSESSMENT AND PLAN:  Discussed the following assessment and plan:  Medicare annual wellness visit, subsequent  Hypothyroidism, unspecified hypothyroidism type - See text take Synthroid away from other medications or food recheck TSH 2-3 months.  Osteoporosis  Gastroesophageal reflux disease, esophagitis presence not specified - Improved symptoms on proton X however because of age and on Actonel keep GI appointment. Has loose stools on Protonix.  Medication management  Heartburn - better on ppi keep gi appt  Bunion  Patient Care Team: Burnis Medin, MD as PCP - General Princess Bruins, MD as Attending Physician (Obstetrics and Gynecology) Addison Lank, MD as Referring Physician (Dermatology) [facility]   Patient Instructions   Thyroid is slightly off  take your thyroid medicine at least  an hour away from food or other medicines. Most people do better with taking it first thing in the morning with water weighting hour before they take other medicines or food. It is possible the Protonix is interfering with absorption of your thyroid medicine.  Stay on the Protonix for now and keep your GI appointment. If your esophagus symptoms are getting worse or recurring get back with [place] earlier. The risedronate can cause esophageal problems. Make sure you were taking it with plenty of water in upright. Your vitamin D level is high normal do not go over 5000 international units a day.  Wellness checkup in 1 year. Or as needed.  Continue lifestyle intervention healthy eating and exercise .      Standley Brooking. Tacie Mccuistion M.D.

## 2015-12-17 NOTE — Patient Instructions (Addendum)
  Thyroid is slightly off  take your thyroid medicine at least an hour away from food or other medicines. Most people do better with taking it first thing in the morning with water weighting hour before they take other medicines or food. It is possible the Protonix is interfering with absorption of your thyroid medicine.  Stay on the Protonix for now and keep your GI appointment. If your esophagus symptoms are getting worse or recurring get back with [place] earlier. The risedronate can cause esophageal problems. Make sure you were taking it with plenty of water in upright. Your vitamin D level is high normal do not go over 5000 international units a day.  Wellness checkup in 1 year. Or as needed.  Continue lifestyle intervention healthy eating and exercise .

## 2015-12-18 ENCOUNTER — Encounter: Payer: Self-pay | Admitting: Internal Medicine

## 2015-12-18 ENCOUNTER — Ambulatory Visit (INDEPENDENT_AMBULATORY_CARE_PROVIDER_SITE_OTHER): Payer: Medicare Other | Admitting: Internal Medicine

## 2015-12-18 VITALS — BP 118/72 | Temp 98.1°F | Ht 60.75 in | Wt 166.0 lb

## 2015-12-18 DIAGNOSIS — K219 Gastro-esophageal reflux disease without esophagitis: Secondary | ICD-10-CM | POA: Diagnosis not present

## 2015-12-18 DIAGNOSIS — Z0000 Encounter for general adult medical examination without abnormal findings: Secondary | ICD-10-CM | POA: Diagnosis not present

## 2015-12-18 DIAGNOSIS — M810 Age-related osteoporosis without current pathological fracture: Secondary | ICD-10-CM

## 2015-12-18 DIAGNOSIS — Z79899 Other long term (current) drug therapy: Secondary | ICD-10-CM

## 2015-12-18 DIAGNOSIS — M21619 Bunion of unspecified foot: Secondary | ICD-10-CM

## 2015-12-18 DIAGNOSIS — E039 Hypothyroidism, unspecified: Secondary | ICD-10-CM | POA: Diagnosis not present

## 2015-12-18 DIAGNOSIS — R12 Heartburn: Secondary | ICD-10-CM

## 2015-12-18 NOTE — Assessment & Plan Note (Signed)
Has been taking her Synthroid with her other medicines and food in the morning. TSH slightly elevated. Change to when she takes it on an empty stomach an hour away from other meds or food and recheck her TSH in a few months. Still abnormal consider adjusting medicine.

## 2016-01-07 ENCOUNTER — Other Ambulatory Visit: Payer: Self-pay | Admitting: Internal Medicine

## 2016-01-08 NOTE — Telephone Encounter (Signed)
Sent to the pharmacy by e-scribe. 

## 2016-01-22 ENCOUNTER — Encounter: Payer: Self-pay | Admitting: Internal Medicine

## 2016-01-22 ENCOUNTER — Ambulatory Visit (INDEPENDENT_AMBULATORY_CARE_PROVIDER_SITE_OTHER): Payer: Medicare Other | Admitting: Internal Medicine

## 2016-01-22 VITALS — BP 110/60 | HR 76 | Ht 61.0 in | Wt 170.2 lb

## 2016-01-22 DIAGNOSIS — Z1211 Encounter for screening for malignant neoplasm of colon: Secondary | ICD-10-CM

## 2016-01-22 DIAGNOSIS — K219 Gastro-esophageal reflux disease without esophagitis: Secondary | ICD-10-CM | POA: Diagnosis not present

## 2016-01-22 MED ORDER — NA SULFATE-K SULFATE-MG SULF 17.5-3.13-1.6 GM/177ML PO SOLN: ORAL | 0 refills | Status: DC

## 2016-01-22 MED ORDER — PANTOPRAZOLE SODIUM 20 MG PO TBEC: 20.0000 mg | DELAYED_RELEASE_TABLET | Freq: Every day | ORAL | 5 refills | Status: DC

## 2016-01-22 NOTE — Progress Notes (Signed)
Referred by: Burnis Medin, MD [place], [place] [address]  Subjective:    Patient ID: [name] [name], female    DOB: [birth year], 75 y.o.   MRN: [id number] Cc: heartburn and reflux HPI Delightful 75 yo ww with years of infrequent heartburn that progressed to almost daily sxs of heartburn and reflux - Dr. Regis Bill started pantoprazole 40 mg qd a little over a month ago with significant improvement of sxs. No dysphagia, unintentional weight loss or anemia/bleeding. Was also having belching and bloating which is better. Last colonoscopy (negative)11-12 years ago. No other GI sxs.  No Known Allergies Outpatient Medications Prior to Visit  Medication Sig Dispense Refill  . B Complex-C (B-COMPLEX WITH VITAMIN C) tablet Take 1 tablet by mouth every morning.    Marland Kitchen CALCIUM PO Take 1,200 mg by mouth daily.     . cholecalciferol (VITAMIN D) 1000 UNITS tablet Take 1,000 Units by mouth every morning.     Marland Kitchen MINIVELLE 0.0375 MG/24HR Place 1 patch onto the skin 2 times a week  5  . pantoprazole (PROTONIX) 40 MG tablet TAKE 1 TABLET(40 MG) BY MOUTH DAILY 30 tablet 5  . SYNTHROID 88 MCG tablet TAKE 1 TABLET(88 MCG) BY MOUTH DAILY BEFORE BREAKFAST 30 tablet 8  . Diphenhyd-Hydrocort-Nystatin (FIRST-DUKES MOUTHWASH) SUSP 43ml swish slosh, hold in mouth x 20 seconds then spit 240 mL 0  . risedronate (ACTONEL) 150 MG tablet Take 150 mg by mouth every 30 (thirty) days. with water on empty stomach, nothing by mouth or lie down for next 30 minutes.      No facility-administered medications prior to visit.    Past Medical History:  Diagnosis Date  . Dog bite of thigh 04/09/2012  . Elevated lipids   . GERD (gastroesophageal reflux disease)   . Hypothyroidism    post thyroidectomy  . Osteopenia    t -2.5 hip in 2005 by Dexa  . Osteoporosis    commpr fx after dog related fall  injury  . Seasonal rhinitis    spring and fall  . Skin cancer    squamous cell ca chest  . Vaginal prolapse    hs  corrected   Past Surgical History:  Procedure Laterality Date  . ABDOMINAL HYSTERECTOMY  07/2007  . bladders sling surgery  07/2007  . COLONOSCOPY  2006  . THYROIDECTOMY     Social History   Social History  . Marital status: Married    Spouse name: N/A  . Number of children: N/A  . Years of education: N/A   Social History Main Topics  . Smoking status: Never Smoker  . Smokeless tobacco: Never Used  . Alcohol use 0.6 oz/week    1 Glasses of wine per week     Comment: occasionally  . Drug use: No  . Sexual activity: Not Asked   Other Topics Concern  . 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 Heart attack Mother   . Alcohol abuse Mother   . Kidney cancer Father   . Esophageal cancer Neg Hx   . Colon cancer Neg Hx   . Pancreatic cancer Neg Hx   . Stomach cancer Neg Hx  Review of Systems As above + back pain, allergies All other ROS negative    Objective:   Physical Exam @BP  110/60   Pulse 76   Ht 5\' 1"  (1.549 m)   Wt 170 lb 3.2 oz (77.2 kg)   BMI 32.16 kg/m @  General:  Well-developed, well-nourished and in no acute distress Eyes:  anicteric. ENT:   Mouth and posterior pharynx free of lesions.  Neck:   supple w/o thyromegaly or mass.  Lungs: Clear to auscultation bilaterally. Heart:  S1S2, no rubs, murmurs, gallops. Abdomen:  soft, non-tender, no hepatosplenomegaly, hernia, or mass and BS+.  Rectal: Deferred  Lymph:  no cervical or supraclavicular adenopathy. Extremities:   no edema, cyanosis or clubbing Skin   no rash. Neuro:  A&O x 3.  Psych:  appropriate mood and  Affect.   Data Reviewed: PCP notes 2017 Labs 2017     Assessment & Plan:   Encounter Diagnoses  Name Primary?  . Gastroesophageal reflux disease, esophagitis presence not specified Yes  .  Colon cancer screening    Well controlled or getting there re: GERD on PPI Lifestyle measures pretty good -  After 2 months will try 20 mg pantoprazole qd  We discussed colon cancer screening options and she has decided to do a colonoscopy. The risks and benefits as well as alternatives of endoscopic procedure(s) have been discussed and reviewed. All questions answered. The patient agrees to proceed.  I appreciate the opportunity to care for this patient.  [id number] KOTVAN, MD

## 2016-01-22 NOTE — Patient Instructions (Addendum)
You have been scheduled for a colonoscopy. Please follow written instructions given to you at your visit today.  Please pick up your prep supplies at the pharmacy within the next 1-3 days. If you use inhalers (even only as needed), please bring them with you on the day of your procedure. Your physician has requested that you go to www.startemmi.com and enter the access code given to you at your visit today. This web site gives a general overview about your procedure. However, you should still follow specific instructions given to you by our office regarding your preparation for the procedure.   We have given you a printed rx for pantoprazole 20mg  to take to the pharmacy when you get done with your 40mg  pantoprazole.   If you are age 62 or older, your body mass index should be between 23-30. Your Body mass index is 32.16 kg/m. If this is out of the aforementioned range listed, please consider follow up with your Primary Care Provider.  If you are age 76 or younger, your body mass index should be between 19-25. Your Body mass index is 32.16 kg/m. If this is out of the aformentioned range listed, please consider follow up with your Primary Care Provider.   Thank you for choosing me and [facility].  Gatha Mayer, MD, [facility]al

## 2016-01-24 ENCOUNTER — Encounter: Payer: Self-pay | Admitting: Internal Medicine

## 2016-01-27 ENCOUNTER — Ambulatory Visit: Payer: Medicare Other | Admitting: Internal Medicine

## 2016-03-08 DIAGNOSIS — Z85828 Personal history of other malignant neoplasm of skin: Secondary | ICD-10-CM | POA: Diagnosis not present

## 2016-03-08 DIAGNOSIS — D692 Other nonthrombocytopenic purpura: Secondary | ICD-10-CM | POA: Diagnosis not present

## 2016-03-08 DIAGNOSIS — L821 Other seborrheic keratosis: Secondary | ICD-10-CM | POA: Diagnosis not present

## 2016-03-08 DIAGNOSIS — L820 Inflamed seborrheic keratosis: Secondary | ICD-10-CM | POA: Diagnosis not present

## 2016-03-08 DIAGNOSIS — D1801 Hemangioma of skin and subcutaneous tissue: Secondary | ICD-10-CM | POA: Diagnosis not present

## 2016-03-08 DIAGNOSIS — L905 Scar conditions and fibrosis of skin: Secondary | ICD-10-CM | POA: Diagnosis not present

## 2016-03-08 DIAGNOSIS — L570 Actinic keratosis: Secondary | ICD-10-CM | POA: Diagnosis not present

## 2016-03-18 ENCOUNTER — Other Ambulatory Visit (INDEPENDENT_AMBULATORY_CARE_PROVIDER_SITE_OTHER): Payer: Medicare Other

## 2016-03-18 ENCOUNTER — Ambulatory Visit (INDEPENDENT_AMBULATORY_CARE_PROVIDER_SITE_OTHER): Payer: Medicare Other | Admitting: *Deleted

## 2016-03-18 DIAGNOSIS — I519 Heart disease, unspecified: Principal | ICD-10-CM

## 2016-03-18 DIAGNOSIS — Z23 Encounter for immunization: Secondary | ICD-10-CM | POA: Diagnosis not present

## 2016-03-18 DIAGNOSIS — R946 Abnormal results of thyroid function studies: Secondary | ICD-10-CM | POA: Diagnosis not present

## 2016-03-18 DIAGNOSIS — E039 Hypothyroidism, unspecified: Secondary | ICD-10-CM

## 2016-03-18 LAB — TSH: TSH: 0.99 u[IU]/mL (ref 0.35–4.50)

## 2016-03-24 ENCOUNTER — Encounter: Payer: Medicare Other | Admitting: Internal Medicine

## 2016-03-28 DIAGNOSIS — Z124 Encounter for screening for malignant neoplasm of cervix: Secondary | ICD-10-CM | POA: Diagnosis not present

## 2016-03-28 DIAGNOSIS — Z01419 Encounter for gynecological examination (general) (routine) without abnormal findings: Secondary | ICD-10-CM | POA: Diagnosis not present

## 2016-03-31 ENCOUNTER — Encounter: Payer: Medicare Other | Admitting: Internal Medicine

## 2016-04-11 ENCOUNTER — Encounter: Payer: Self-pay | Admitting: Internal Medicine

## 2016-04-11 DIAGNOSIS — M8589 Other specified disorders of bone density and structure, multiple sites: Secondary | ICD-10-CM | POA: Diagnosis not present

## 2016-04-11 DIAGNOSIS — M810 Age-related osteoporosis without current pathological fracture: Secondary | ICD-10-CM | POA: Diagnosis not present

## 2016-04-18 DIAGNOSIS — H35363 Drusen (degenerative) of macula, bilateral: Secondary | ICD-10-CM | POA: Diagnosis not present

## 2016-04-18 DIAGNOSIS — D3132 Benign neoplasm of left choroid: Secondary | ICD-10-CM | POA: Diagnosis not present

## 2016-04-18 DIAGNOSIS — H2513 Age-related nuclear cataract, bilateral: Secondary | ICD-10-CM | POA: Diagnosis not present

## 2016-05-25 ENCOUNTER — Telehealth: Payer: Self-pay | Admitting: Family Medicine

## 2016-05-25 NOTE — Telephone Encounter (Signed)
Pt would like to have Cologuard testing.  Is she a candidate?

## 2016-05-25 NOTE — Telephone Encounter (Signed)
Reviewed record Dr. Carlean Purl evaluation in summer has had a negative colonoscopy in the remote past and no family history of colon cancer. Based on this okay to proceed with cologuard as a screening test.

## 2016-05-30 NOTE — Telephone Encounter (Signed)
Left a message for the pt to call back.  Will need to come by the office and sign Cologuard orders.

## 2016-06-01 NOTE — Telephone Encounter (Signed)
Spoke to the pt and informed her that the Cologuard paperwork is at the front desk and ready to be signed.  She will come by and then I will fax.

## 2016-06-16 DIAGNOSIS — S46012A Strain of muscle(s) and tendon(s) of the rotator cuff of left shoulder, initial encounter: Secondary | ICD-10-CM | POA: Diagnosis not present

## 2016-06-16 DIAGNOSIS — M25512 Pain in left shoulder: Secondary | ICD-10-CM | POA: Diagnosis not present

## 2016-06-22 DIAGNOSIS — Z1212 Encounter for screening for malignant neoplasm of rectum: Secondary | ICD-10-CM | POA: Diagnosis not present

## 2016-06-22 DIAGNOSIS — Z1211 Encounter for screening for malignant neoplasm of colon: Secondary | ICD-10-CM | POA: Diagnosis not present

## 2016-06-22 LAB — COLOGUARD

## 2016-06-28 ENCOUNTER — Emergency Department (INDEPENDENT_AMBULATORY_CARE_PROVIDER_SITE_OTHER)
Admission: EM | Admit: 2016-06-28 | Discharge: 2016-06-28 | Disposition: A | Discharge status: Home / Self Care | Payer: Medicare Other | Source: Home / Self Care | Attending: Family Medicine | Admitting: Family Medicine

## 2016-06-28 DIAGNOSIS — J069 Acute upper respiratory infection, unspecified: Secondary | ICD-10-CM

## 2016-06-28 DIAGNOSIS — B9789 Other viral agents as the cause of diseases classified elsewhere: Secondary | ICD-10-CM

## 2016-06-28 MED ORDER — AMOXICILLIN 875 MG PO TABS: 875.0000 mg | ORAL_TABLET | Freq: Two times a day (BID) | ORAL | 0 refills | Status: DC

## 2016-06-28 MED ORDER — PREDNISONE 20 MG PO TABS: ORAL_TABLET | ORAL | 0 refills | Status: DC

## 2016-06-28 NOTE — ED Triage Notes (Signed)
Started 4 days ago with cough, drainage, and losing her voice,

## 2016-06-28 NOTE — Discharge Instructions (Signed)
Take plain guaifenesin (1200mg  extended release tabs such as Mucinex) twice daily, with plenty of water, for cough and congestion.  Get adequate rest.   May use Afrin nasal spray (or generic oxymetazoline) twice daily for about 5 days and then discontinue.  Also recommend using saline nasal spray several times daily and saline nasal irrigation (AYR is a common brand).  Use Flonase nasal spray each morning after using Afrin nasal spray and saline nasal irrigation. Try warm salt water gargles for sore throat.  Stop all antihistamines for now, and other non-prescription cough/cold preparations. May take Delsym Cough Suppressant at bedtime for nighttime cough.  Begin Amoxicillin if not improving about one week or if persistent fever develops

## 2016-06-28 NOTE — ED Provider Notes (Signed)
[name]    CSN: [id number] Arrival date & time: 06/28/16  1842     History   Chief Complaint Chief Complaint  Patient presents with  . Cough    HPI [name] [name] is a 76 y.o. female.   Patient complains of four day history of typical cold-like symptoms developing over several days, including mild sore throat, sinus congestion, fatigue, chills, hoarseness, and cough.  She is concerned about her hoarse voice because she works in [employer] and is having difficulty talking to customers.   The history is provided by the patient.    Past Medical History:  Diagnosis Date  . Dog bite of thigh 04/09/2012  . Elevated lipids   . GERD (gastroesophageal reflux disease)   . Hypothyroidism    post thyroidectomy  . Osteopenia    t -2.5 hip in 2005 by Dexa  . Osteoporosis    commpr fx after dog related fall  injury  . Seasonal rhinitis    spring and fall  . Skin cancer    squamous cell ca chest  . Vaginal prolapse    hs corrected    Patient Active Problem List   Diagnosis Date Noted  . Medication management 10/16/2013  . Medicare annual wellness visit, subsequent 07/16/2012  . Skin cancer   . Heart palpitations 2011-05-07  . Family history of sudden death 05/07/11  . PALPITATIONS 03/31/2010  . BACK PAIN, LUMBAR 10/13/2009  . HEARTBURN 10/13/2009  . FREQUENCY, URINARY 10/13/2009  . FOOT PAIN 03/31/2008  . Hypothyroidism 01/25/2007  . Osteoporosis 01/25/2007  . OSTEOPENIA 01/25/2007    Past Surgical History:  Procedure Laterality Date  . ABDOMINAL HYSTERECTOMY  07/2007  . bladders sling surgery  07/2007  . COLONOSCOPY  2006  . THYROIDECTOMY      OB History    Gravida Para Term Preterm AB Living   1 1           SAB TAB Ectopic Multiple Live Births                   Home Medications    Prior to Admission medications   Medication Sig Start Date End Date Taking? Authorizing Provider  amoxicillin (AMOXIL) 875 MG tablet Take 1 tablet (875 mg total)  by mouth 2 (two) times daily. (Rx void after 07/06/16) 06/28/16   Kandra Nicolas, MD  B Complex-C (B-COMPLEX WITH VITAMIN C) tablet Take 1 tablet by mouth every morning.    Historical Provider, MD  CALCIUM PO Take 1,200 mg by mouth daily.     Historical Provider, MD  CALCIUM-MAGNESIUM-ZINC PO Take 1 capsule by mouth daily.    Historical Provider, MD  cholecalciferol (VITAMIN D) 1000 UNITS tablet Take 1,000 Units by mouth every morning.     Historical Provider, MD  MINIVELLE 0.0375 MG/24HR Place 1 patch onto the skin 2 times a week 10/14/14   Historical Provider, MD  Na Sulfate-K Sulfate-Mg Sulf 17.5-3.13-1.6 GM/180ML SOLN Suprep-Use as directed 01/22/16   Gatha Mayer, MD  pantoprazole (PROTONIX) 20 MG tablet Take 1 tablet (20 mg total) by mouth daily before breakfast. 01/22/16   Gatha Mayer, MD  pantoprazole (PROTONIX) 40 MG tablet TAKE 1 TABLET(40 MG) BY MOUTH DAILY 01/08/16   Burnis Medin, MD  predniSONE (DELTASONE) 20 MG tablet Take one tab by mouth twice daily for 4 days, then one daily for 3 days. Take with food. 06/28/16   Kandra Nicolas, MD  SYNTHROID 88 MCG tablet TAKE  1 TABLET(88 MCG) BY MOUTH DAILY BEFORE BREAKFAST 01/08/16   Burnis Medin, MD    Family History Family History  Problem Relation Age of Onset  . Heart attack Mother   . Alcohol abuse Mother   . Kidney cancer Father   . Esophageal cancer Neg Hx   . Colon cancer Neg Hx   . Pancreatic cancer Neg Hx   . Stomach cancer Neg Hx     Social History Social History  Substance Use Topics  . Smoking status: Never Smoker  . Smokeless tobacco: Never Used  . Alcohol use 0.6 oz/week    1 Glasses of wine per week     Comment: occasionally     Allergies   Patient has no known allergies.   Review of Systems Review of Systems + sore throat + hoarse + cough No pleuritic pain No wheezing + nasal congestion + post-nasal drainage No sinus pain/pressure No itchy/red eyes No earache No hemoptysis No SOB No fever, +  chills No nausea No vomiting No abdominal pain No diarrhea No urinary symptoms No skin rash + fatigue No myalgias No headache Used OTC meds without relief   Physical Exam Triage Vital Signs ED Triage Vitals  Enc Vitals Group     BP 06/28/16 1904 128/80     Pulse Rate 06/28/16 1904 75     Resp --      Temp 06/28/16 1904 97.7 F (36.5 C)     Temp Source 06/28/16 1904 Oral     SpO2 06/28/16 1904 96 %     Weight 06/28/16 1905 166 lb (75.3 kg)     Height 06/28/16 1905 5\' 1"  (1.549 m)     Head Circumference --      Peak Flow --      Pain Score 06/28/16 1906 0     Pain Loc --      Pain Edu? --      Excl. in [place]? --    No data found.   Updated Vital Signs BP 128/80 (BP Location: Left Arm)   Pulse 75   Temp 97.7 F (36.5 C) (Oral)   Ht 5\' 1"  (1.549 m)   Wt 166 lb (75.3 kg)   SpO2 96%   BMI 31.37 kg/m   Visual Acuity Right Eye Distance:   Left Eye Distance:   Bilateral Distance:    Right Eye Near:   Left Eye Near:    Bilateral Near:     Physical Exam  Nursing notes and Vital Signs reviewed. Appearance:  Patient appears stated age, and in no acute distress.  Patient's voice is hoarse. Eyes:  Pupils are equal, round, and reactive to light and accomodation.  Extraocular movement is intact.  Conjunctivae are not inflamed  Ears:  Canals normal.  Tympanic membranes normal.  Nose:  Mildly congested turbinates.  No sinus tenderness.    Pharynx:  Normal Neck:  Supple.  Tender enlarged posterior/lateral nodes are palpated bilaterally  Lungs:  Clear to auscultation.  Breath sounds are equal.  Moving air well. Heart:  Regular rate and rhythm without murmurs, rubs, or gallops.  Abdomen:  Nontender without masses or hepatosplenomegaly.  Bowel sounds are present.  No CVA or flank tenderness.  Extremities:  No edema.  Skin:  No rash present.    UC Treatments / Results  Labs (all labs ordered are listed, but only abnormal results are displayed) Labs Reviewed - No data to  display  EKG  EKG Interpretation None  Radiology No results found.  Procedures Procedures (including critical care time)  Medications Ordered in UC Medications - No data to display   Initial Impression / Assessment and Plan / UC Course  I have reviewed the triage vital signs and the nursing notes.  Pertinent labs & imaging results that were available during my care of the patient were reviewed by me and considered in my medical decision making (see chart for details).  Clinical Course   There is no evidence of bacterial infection today.   Begin prednisone burst/taper to minimize patient's hoarseness. Take plain guaifenesin (1200mg  extended release tabs such as Mucinex) twice daily, with plenty of water, for cough and congestion.  Get adequate rest.   May use Afrin nasal spray (or generic oxymetazoline) twice daily for about 5 days and then discontinue.  Also recommend using saline nasal spray several times daily and saline nasal irrigation (AYR is a common brand).  Use Flonase nasal spray each morning after using Afrin nasal spray and saline nasal irrigation. Try warm salt water gargles for sore throat.  Stop all antihistamines for now, and other non-prescription cough/cold preparations. May take Delsym Cough Suppressant at bedtime for nighttime cough.  Begin Amoxicillin if not improving about one week or if persistent fever develops (Given a prescription to hold, with an expiration date) Followup with Family Doctor if not improved in 10 days.     Final Clinical Impressions(s) / UC Diagnoses   Final diagnoses:  Viral URI with cough    New Prescriptions New Prescriptions   AMOXICILLIN (AMOXIL) 875 MG TABLET    Take 1 tablet (875 mg total) by mouth 2 (two) times daily. (Rx void after 07/06/16)   PREDNISONE (DELTASONE) 20 MG TABLET    Take one tab by mouth twice daily for 4 days, then one daily for 3 days. Take with food.     Kandra Nicolas, MD 07/18/16 [phone]

## 2016-08-08 DIAGNOSIS — Z1231 Encounter for screening mammogram for malignant neoplasm of breast: Secondary | ICD-10-CM | POA: Diagnosis not present

## 2016-08-08 LAB — HM MAMMOGRAPHY

## 2016-08-19 ENCOUNTER — Encounter: Payer: Self-pay | Admitting: Family Medicine

## 2016-09-12 ENCOUNTER — Other Ambulatory Visit: Payer: Self-pay

## 2016-09-12 MED ORDER — PANTOPRAZOLE SODIUM 20 MG PO TBEC: 20.0000 mg | DELAYED_RELEASE_TABLET | Freq: Every day | ORAL | 0 refills | Status: DC

## 2016-09-12 NOTE — Telephone Encounter (Signed)
Pantoprazole 20mg  tablets sent in to [facility].

## 2016-09-13 DIAGNOSIS — L905 Scar conditions and fibrosis of skin: Secondary | ICD-10-CM | POA: Diagnosis not present

## 2016-09-13 DIAGNOSIS — D225 Melanocytic nevi of trunk: Secondary | ICD-10-CM | POA: Diagnosis not present

## 2016-09-13 DIAGNOSIS — Z85828 Personal history of other malignant neoplasm of skin: Secondary | ICD-10-CM | POA: Diagnosis not present

## 2016-09-13 DIAGNOSIS — D692 Other nonthrombocytopenic purpura: Secondary | ICD-10-CM | POA: Diagnosis not present

## 2016-09-13 DIAGNOSIS — L821 Other seborrheic keratosis: Secondary | ICD-10-CM | POA: Diagnosis not present

## 2016-09-13 DIAGNOSIS — D1801 Hemangioma of skin and subcutaneous tissue: Secondary | ICD-10-CM | POA: Diagnosis not present

## 2016-09-19 DIAGNOSIS — M5412 Radiculopathy, cervical region: Secondary | ICD-10-CM | POA: Diagnosis not present

## 2016-09-19 DIAGNOSIS — M25512 Pain in left shoulder: Secondary | ICD-10-CM | POA: Diagnosis not present

## 2016-10-06 DIAGNOSIS — M4722 Other spondylosis with radiculopathy, cervical region: Secondary | ICD-10-CM | POA: Diagnosis not present

## 2016-10-06 DIAGNOSIS — M4312 Spondylolisthesis, cervical region: Secondary | ICD-10-CM | POA: Diagnosis not present

## 2016-10-06 DIAGNOSIS — M542 Cervicalgia: Secondary | ICD-10-CM | POA: Diagnosis not present

## 2016-10-24 DIAGNOSIS — M4722 Other spondylosis with radiculopathy, cervical region: Secondary | ICD-10-CM | POA: Diagnosis not present

## 2016-11-02 DIAGNOSIS — M542 Cervicalgia: Secondary | ICD-10-CM | POA: Diagnosis not present

## 2016-11-02 DIAGNOSIS — M5412 Radiculopathy, cervical region: Secondary | ICD-10-CM | POA: Diagnosis not present

## 2016-11-18 ENCOUNTER — Other Ambulatory Visit: Payer: Self-pay | Admitting: Internal Medicine

## 2016-11-24 ENCOUNTER — Other Ambulatory Visit: Payer: Self-pay | Admitting: Internal Medicine

## 2016-11-25 MED ORDER — LEVOTHYROXINE SODIUM 88 MCG PO TABS: 88.0000 ug | ORAL_TABLET | Freq: Every day | ORAL | 0 refills | Status: DC

## 2016-12-01 DIAGNOSIS — M542 Cervicalgia: Secondary | ICD-10-CM | POA: Diagnosis not present

## 2016-12-05 ENCOUNTER — Encounter: Payer: Self-pay | Admitting: Emergency Medicine

## 2016-12-05 ENCOUNTER — Emergency Department (INDEPENDENT_AMBULATORY_CARE_PROVIDER_SITE_OTHER)
Admission: EM | Admit: 2016-12-05 | Discharge: 2016-12-05 | Disposition: A | Discharge status: Home / Self Care | Payer: Medicare Other | Source: Home / Self Care | Attending: Family Medicine | Admitting: Family Medicine

## 2016-12-05 ENCOUNTER — Telehealth: Payer: Self-pay | Admitting: Emergency Medicine

## 2016-12-05 DIAGNOSIS — R350 Frequency of micturition: Secondary | ICD-10-CM | POA: Diagnosis not present

## 2016-12-05 LAB — POCT URINALYSIS DIP (MANUAL ENTRY)
Bilirubin, UA: NEGATIVE
Blood, UA: NEGATIVE
Glucose, UA: NEGATIVE mg/dL
Ketones, POC UA: NEGATIVE mg/dL
Nitrite, UA: NEGATIVE
Protein Ur, POC: NEGATIVE mg/dL
Spec Grav, UA: 1.02 (ref 1.010–1.025)
Urobilinogen, UA: 0.2 E.U./dL
pH, UA: 7 (ref 5.0–8.0)

## 2016-12-05 NOTE — ED Triage Notes (Signed)
Pt c/o urinary frequency and low back pain x2 days. Denies meds and hx of UTI.

## 2016-12-05 NOTE — Discharge Instructions (Signed)
°  You may try over the counter medication called Azo or pyridium to help with bladder spasms and urinary discomfort.  This medication can make your urine orange, which is normal.

## 2016-12-05 NOTE — ED Provider Notes (Signed)
CSN: [id number]     Arrival date & time 12/05/16  1442 History   First MD Initiated Contact with Patient 12/05/16 1501     Chief Complaint  Patient presents with  . Urinary Frequency   (Consider location/radiation/quality/duration/timing/severity/associated sxs/prior Treatment) HPI  [name] [name] is a 76 y.o. female presenting to UC with c/o 2 days of low back pain and urinary frequency. No dysuria.  Hx of UTI many years ago.  Denies fever, chills, n/v/d. Denies hematuria.  She has not taken any OTC medication for her symptoms.    Past Medical History:  Diagnosis Date  . Dog bite of thigh 04/09/2012  . Elevated lipids   . GERD (gastroesophageal reflux disease)   . Hypothyroidism    post thyroidectomy  . Osteopenia    t -2.5 hip in 2005 by Dexa  . Osteoporosis    commpr fx after dog related fall  injury  . Seasonal rhinitis    spring and fall  . Skin cancer    squamous cell ca chest  . Vaginal prolapse    hs corrected   Past Surgical History:  Procedure Laterality Date  . ABDOMINAL HYSTERECTOMY  07/2007  . bladders sling surgery  07/2007  . COLONOSCOPY  2006  . THYROIDECTOMY     Family History  Problem Relation Age of Onset  . Heart attack Mother   . Alcohol abuse Mother   . Kidney cancer Father   . Esophageal cancer Neg Hx   . Colon cancer Neg Hx   . Pancreatic cancer Neg Hx   . Stomach cancer Neg Hx    Social History  Substance Use Topics  . Smoking status: Never Smoker  . Smokeless tobacco: Never Used  . Alcohol use 0.6 oz/week    1 Glasses of wine per week     Comment: occasionally   OB History    Gravida Para Term Preterm AB Living   1 1           SAB TAB Ectopic Multiple Live Births                 Review of Systems  Constitutional: Negative for chills and fever.  Gastrointestinal: Negative for abdominal pain, diarrhea, nausea and vomiting.  Genitourinary: Positive for frequency. Negative for dysuria, hematuria, pelvic pain and urgency.   Musculoskeletal: Positive for back pain. Negative for arthralgias and myalgias.    Allergies  Patient has no known allergies.  Home Medications   Prior to Admission medications   Medication Sig Start Date End Date Taking? Authorizing Provider  amoxicillin (AMOXIL) 875 MG tablet Take 1 tablet (875 mg total) by mouth 2 (two) times daily. (Rx void after 07/06/16) 06/28/16   Kandra Nicolas, MD  B Complex-C (B-COMPLEX WITH VITAMIN C) tablet Take 1 tablet by mouth every morning.    [provider]  CALCIUM PO Take 1,200 mg by mouth daily.     [provider]  CALCIUM-MAGNESIUM-ZINC PO Take 1 capsule by mouth daily.    [provider]  cholecalciferol (VITAMIN D) 1000 UNITS tablet Take 1,000 Units by mouth every morning.     [provider]  levothyroxine (SYNTHROID) 88 MCG tablet Take 1 tablet (88 mcg total) by mouth daily before breakfast. 11/25/16   Panosh, Standley Brooking, MD  MINIVELLE 0.0375 MG/24HR Place 1 patch onto the skin 2 times a week 10/14/14   [provider]  Na Sulfate-K Sulfate-Mg Sulf 17.5-3.13-1.6 GM/180ML SOLN Suprep-Use as directed 01/22/16  Gatha Mayer, MD  pantoprazole (PROTONIX) 20 MG tablet Take 1 tablet (20 mg total) by mouth daily before breakfast. 09/12/16   Gatha Mayer, MD  predniSONE (DELTASONE) 20 MG tablet Take one tab by mouth twice daily for 4 days, then one daily for 3 days. Take with food. 06/28/16   Kandra Nicolas, MD   Meds Ordered and Administered this Visit  Medications - No data to display  BP 132/72 (BP Location: Right Arm)   Pulse 68   Temp 97.4 F (36.3 C) (Oral)   Wt 168 lb (76.2 kg)   SpO2 96%   BMI 31.74 kg/m  No data found.   Physical Exam  Constitutional: She is oriented to person, place, and time. She appears well-developed and well-nourished. No distress.  HENT:  Head: Normocephalic and atraumatic.  Mouth/Throat: Oropharynx is clear and moist.  Eyes: EOM are normal.  Neck: Normal range of  motion.  Cardiovascular: Normal rate and regular rhythm.   Pulmonary/Chest: Effort normal and breath sounds normal. No respiratory distress. She has no wheezes. She has no rales.  Abdominal: Soft. She exhibits no distension. There is no tenderness. There is no CVA tenderness.  Musculoskeletal: Normal range of motion.  Neurological: She is alert and oriented to person, place, and time.  Skin: Skin is warm and dry. She is not diaphoretic.  Psychiatric: She has a normal mood and affect. Her behavior is normal.  Nursing note and vitals reviewed.   Urgent Care Course     Procedures (including critical care time)  Labs Review Labs Reviewed  POCT URINALYSIS DIP (MANUAL ENTRY) - Abnormal; Notable for the following:       Result Value   Clarity, UA cloudy (*)    Leukocytes, UA Small (1+) (*)    All other components within normal limits  URINE CULTURE    Imaging Review No results found.   MDM   1. Urinary frequency    UA: questionable for UA. Culture sent  Will hold off on antibiotics at this time. Encouraged symptomatic treatment while culture pending F/u with PCP in 1 week if not improving.     Noland Fordyce, PA-C 12/05/16 1739

## 2016-12-06 LAB — URINE CULTURE: Organism ID, Bacteria: NO GROWTH

## 2016-12-07 ENCOUNTER — Telehealth: Payer: Self-pay | Admitting: Emergency Medicine

## 2016-12-07 NOTE — Telephone Encounter (Signed)
Hope you are feeling better, no growth

## 2016-12-20 ENCOUNTER — Encounter: Payer: Medicare Other | Admitting: Internal Medicine

## 2016-12-21 ENCOUNTER — Other Ambulatory Visit: Payer: Self-pay | Admitting: Internal Medicine

## 2017-01-04 ENCOUNTER — Ambulatory Visit: Payer: Medicare Other | Admitting: Internal Medicine

## 2017-01-20 ENCOUNTER — Ambulatory Visit (INDEPENDENT_AMBULATORY_CARE_PROVIDER_SITE_OTHER): Payer: Medicare Other | Admitting: Obstetrics & Gynecology

## 2017-01-20 ENCOUNTER — Encounter: Payer: Self-pay | Admitting: Obstetrics & Gynecology

## 2017-01-20 VITALS — BP 126/74 | Ht 60.0 in | Wt 168.0 lb

## 2017-01-20 DIAGNOSIS — Z780 Asymptomatic menopausal state: Secondary | ICD-10-CM | POA: Diagnosis not present

## 2017-01-20 DIAGNOSIS — Z1272 Encounter for screening for malignant neoplasm of vagina: Secondary | ICD-10-CM

## 2017-01-20 DIAGNOSIS — Z01411 Encounter for gynecological examination (general) (routine) with abnormal findings: Secondary | ICD-10-CM | POA: Diagnosis not present

## 2017-01-20 DIAGNOSIS — M810 Age-related osteoporosis without current pathological fracture: Secondary | ICD-10-CM

## 2017-01-20 NOTE — Progress Notes (Signed)
[name] [name] 1941/06/08 [id number]   History:    76 y.o. G1P1 married.  RP:  Established patient presenting for annual gyn exam   HPI:  Menopause s/p Hysterectomy on Estradiol patch.  S/P Sacrocolpopexy.  Good pelvic support since surgery many years ago.  No pelvic pain.  Normal vaginal secretions.  Rarely sexually active, no problem when is.  Mictions/BMs wnl.  Breasts wnl.  Physically active.  Good nutrition.  Past medical history,surgical history, family history and social history were all reviewed and documented in the EPIC chart.  Gynecologic History No LMP recorded. Patient has had a hysterectomy. Contraception: status post hysterectomy Last Pap: Unsure/Medical records of Paps not available. Last mammogram: 07/2016. Results were: Negative. Colonoscopy 2006. Dexa 03/2016:  Osteoporosis at Rt and Lt hips (Rt femur neck T -2.80, Lt total femur T -2.60)  Obstetric History OB History  Gravida Para Term Preterm AB Living  1 1       1   SAB TAB Ectopic Multiple Live Births               # Outcome Date GA Lbr Len/2nd Weight Sex Delivery Anes PTL Lv  1 Para                ROS: A ROS was performed and pertinent positives and negatives are included in the history.  GENERAL: No fevers or chills. HEENT: No change in vision, no earache, sore throat or sinus congestion. NECK: No pain or stiffness. CARDIOVASCULAR: No chest pain or pressure. No palpitations. PULMONARY: No shortness of breath, cough or wheeze. GASTROINTESTINAL: No abdominal pain, nausea, vomiting or diarrhea, melena or bright red blood per rectum. GENITOURINARY: No urinary frequency, urgency, hesitancy or dysuria. MUSCULOSKELETAL: No joint or muscle pain, no back pain, no recent trauma. DERMATOLOGIC: No rash, no itching, no lesions. ENDOCRINE: No polyuria, polydipsia, no heat or cold intolerance. No recent change in weight. HEMATOLOGICAL: No anemia or easy bruising or bleeding. NEUROLOGIC: No headache, seizures, numbness,  tingling or weakness. PSYCHIATRIC: No depression, no loss of interest in normal activity or change in sleep pattern.     Exam:   BP 126/74   Ht 5' (1.524 m)   Wt 168 lb (76.2 kg)   BMI 32.81 kg/m   Body mass index is 32.81 kg/m.  General appearance : Well developed well nourished female. No acute distress HEENT: Eyes: no retinal hemorrhage or exudates,  Neck supple, trachea midline, no carotid bruits, no thyroidmegaly Lungs: Clear to auscultation, no rhonchi or wheezes, or rib retractions  Heart: Regular rate and rhythm, no murmurs or gallops Breast:Examined in sitting and supine position were symmetrical in appearance, no palpable masses or tenderness,  no skin retraction, no nipple inversion, no nipple discharge, no skin discoloration, no axillary or supraclavicular lymphadenopathy Abdomen: no palpable masses or tenderness, no rebound or guarding Extremities: no edema or skin discoloration or tenderness  Pelvic:  Bartholin, Urethra, Skene Glands: Within normal limits             Vagina: No gross lesions or discharge.  Pap reflex done.  Good suspension of vaginal apex s/p Sacrocolpopexy.  Uterus/cervix surgically absent  Adnexa  Without masses or tenderness  Anus and perineum  normal    Assessment/Plan:  76 y.o. female for annual exam   1. Encounter for gynecological examination with abnormal finding Gyn exam s/p Hysterectomy and Sacrocolpopexy.  Good Pelvic support.  Pap reflex done.  Breasts wnl.  Screening mammo neg 07/2016.  2. Age-related osteoporosis  without current pathological fracture Decision to treat with Prolia.  Usage/risks/benefits reviewed and pamphlet provided.  Will verify Ca++ and Vit D levels today.  Patient on supplements and physically active with weight bearing activities. - Calcium - Vitamin D 1,25 dihydroxy  3. Menopause present Decision to d/c Estradiol patch given age related risks of strokes and breast Ca.  Counseling on above issues >50% x 15  minutes.  Princess Bruins MD, 11:01 AM 01/20/2017

## 2017-01-20 NOTE — Addendum Note (Signed)
Addended by: Thurnell Garbe A on: 01/20/2017 12:07 PM   Modules accepted: Orders

## 2017-01-20 NOTE — Patient Instructions (Signed)
1. Encounter for gynecological examination with abnormal finding Gyn exam s/p Hysterectomy and Sacrocolpopexy.  Good Pelvic support.  Pap reflex done.  Breasts wnl.  Screening mammo neg 07/2016.  2. Age-related osteoporosis without current pathological fracture Decision to treat with Prolia.  Usage/risks/benefits reviewed and pamphlet provided.  Will verify Ca++ and Vit D levels today.  Patient on supplements and physically active with weight bearing activities. - Calcium - Vitamin D 1,25 dihydroxy  3. Menopause present Decision to d/c Estradiol patch given age related risks of strokes and breast Ca.  [name] [name], it was a pleasure to see you today!  I will inform you of your results as soon as available.   Osteoporosis Osteoporosis is the thinning and loss of density in the bones. Osteoporosis makes the bones more brittle, fragile, and likely to break (fracture). Over time, osteoporosis can cause the bones to become so weak that they fracture after a simple fall. The bones most likely to fracture are the bones in the hip, wrist, and spine. What are the causes? The exact cause is not known. What increases the risk? Anyone can develop osteoporosis. You may be at greater risk if you have a family history of the condition or have poor nutrition. You may also have a higher risk if you are:  Female.  75 years old or older.  A smoker.  Not physically active.  White or Asian.  Slender.  What are the signs or symptoms? A fracture might be the first sign of the disease, especially if it results from a fall or injury that would not usually cause a bone to break. Other signs and symptoms include:  Low back and neck pain.  Stooped posture.  Height loss.  How is this diagnosed? To make a diagnosis, your health care provider may:  Take a medical history.  Perform a physical exam.  Order tests, such as: ? A bone mineral density test. ? A dual-energy X-ray absorptiometry test.  How is  this treated? The goal of osteoporosis treatment is to strengthen your bones to reduce your risk of a fracture. Treatment may involve:  Making lifestyle changes, such as: ? Eating a diet rich in calcium. ? Doing weight-bearing and muscle-strengthening exercises. ? Stopping tobacco use. ? Limiting alcohol intake.  Taking medicine to slow the process of bone loss or to increase bone density.  Monitoring your levels of calcium and vitamin D.  Follow these instructions at home:  Include calcium and vitamin D in your diet. Calcium is important for bone health, and vitamin D helps the body absorb calcium.  Perform weight-bearing and muscle-strengthening exercises as directed by your health care provider.  Do not use any tobacco products, including cigarettes, chewing tobacco, and electronic cigarettes. If you need help quitting, ask your health care provider.  Limit your alcohol intake.  Take medicines only as directed by your health care provider.  Keep all follow-up visits as directed by your health care provider. This is important.  Take precautions at home to lower your risk of falling, such as: ? Keeping rooms well lit and clutter free. ? Installing safety rails on stairs. ? Using rubber mats in the bathroom and other areas that are often wet or slippery. Get help right away if: You fall or injure yourself. This information is not intended to replace advice given to you by your health care provider. Make sure you discuss any questions you have with your health care provider. Document Released: 03/23/2005 Document Revised: 11/16/2015 Document Reviewed:  11/21/2013 Elsevier Interactive Patient Education  2017 [employer].

## 2017-01-21 LAB — CALCIUM: Calcium: 9.2 mg/dL (ref 8.6–10.4)

## 2017-01-23 ENCOUNTER — Telehealth: Payer: Self-pay | Admitting: Gynecology

## 2017-01-23 NOTE — Telephone Encounter (Signed)
Osteoporosis with T scores of -2.8 Rt femur neck and -2.6 Lt total femur  Note from Dr Marguerita Merles

## 2017-01-24 LAB — VITAMIN D 1,25 DIHYDROXY
VITAMIN D3 1, 25 (OH): 25 pg/mL
Vitamin D 1, 25 (OH)2 Total: 25 pg/mL (ref 18–72)

## 2017-01-25 LAB — PAP IG W/ RFLX HPV ASCU

## 2017-02-01 ENCOUNTER — Other Ambulatory Visit: Payer: Self-pay | Admitting: Internal Medicine

## 2017-02-13 ENCOUNTER — Encounter: Payer: Self-pay | Admitting: Obstetrics & Gynecology

## 2017-02-14 NOTE — Telephone Encounter (Signed)
Complete exam ML  01/20/17  Calcium 9.2  01/20/17  . Insurance benefits , Medicare Deductible [id number] (met),  Co insurance 20 % approx 1084.62  approx $216.00 OOPM n/a, Secondary insurance covers in full $0 cost to pt. . Husband fell broke arm she will call back to schedule nurse only appointment.

## 2017-02-20 NOTE — Telephone Encounter (Signed)
Prolia 02/21/17 at 10 am Nurse only.

## 2017-02-21 ENCOUNTER — Encounter: Payer: Self-pay | Admitting: Anesthesiology

## 2017-02-21 ENCOUNTER — Ambulatory Visit (INDEPENDENT_AMBULATORY_CARE_PROVIDER_SITE_OTHER): Payer: Medicare Other | Admitting: Anesthesiology

## 2017-02-21 ENCOUNTER — Telehealth: Payer: Self-pay | Admitting: Gynecology

## 2017-02-21 DIAGNOSIS — M810 Age-related osteoporosis without current pathological fracture: Secondary | ICD-10-CM | POA: Diagnosis not present

## 2017-02-21 MED ORDER — DENOSUMAB 60 MG/ML ~~LOC~~ SOLN
60.0000 mg | Freq: Once | SUBCUTANEOUS | Status: AC
  Administered 2017-02-21: 60 mg via SUBCUTANEOUS

## 2017-02-21 NOTE — Telephone Encounter (Signed)
Prolia given 02/21/17  Next due 08/25/17

## 2017-03-17 ENCOUNTER — Encounter: Payer: Self-pay | Admitting: Internal Medicine

## 2017-03-21 ENCOUNTER — Ambulatory Visit (INDEPENDENT_AMBULATORY_CARE_PROVIDER_SITE_OTHER): Payer: Medicare Other | Admitting: Family Medicine

## 2017-03-21 ENCOUNTER — Encounter: Payer: Self-pay | Admitting: Family Medicine

## 2017-03-21 VITALS — BP 120/80 | HR 71 | Temp 98.4°F | Ht 60.0 in | Wt 163.8 lb

## 2017-03-21 DIAGNOSIS — S51801A Unspecified open wound of right forearm, initial encounter: Secondary | ICD-10-CM | POA: Diagnosis not present

## 2017-03-21 DIAGNOSIS — Z23 Encounter for immunization: Secondary | ICD-10-CM | POA: Diagnosis not present

## 2017-03-21 NOTE — Addendum Note (Signed)
Addended by: Agnes Lawrence on: 03/21/2017 01:42 PM   Modules accepted: Orders

## 2017-03-21 NOTE — Patient Instructions (Signed)
BEFORE YOU LEAVE: - flu shot  Sterile petroleum jelly once daily  Keep clean - but uncover when able  Watch for signs of infection and seek care if need if swelling, increased redness, pus, increased pain, does not heal as expected or concerns

## 2017-03-21 NOTE — Progress Notes (Signed)
HPI:  Acute visit for wound: -Right forearm -Contained, domestic, indoor dog scratched her arm when they were playing 8 days ago -she cleaned the wound well, and has been applying sterile petroleum jelly, she feels is improving -No swelling, redness, fevers, malaise -Up-to-date on tetanus vaccine -Reports tetanus up-to-date on all shots and care and was not ill  ROS: See pertinent positives and negatives per HPI.  Past Medical History:  Diagnosis Date  . Dog bite of thigh 04/09/2012  . Elevated lipids   . GERD (gastroesophageal reflux disease)   . Hypothyroidism    post thyroidectomy  . Osteopenia    t -2.5 hip in 2005 by [facility]  . Osteoporosis    commpr fx after dog related fall  injury  . Seasonal rhinitis    spring and fall  . Skin cancer    squamous cell ca chest  . Vaginal prolapse    hs corrected    Past Surgical History:  Procedure Laterality Date  . ABDOMINAL HYSTERECTOMY  07/2007  . bladders sling surgery  07/2007  . COLONOSCOPY  2006  . THYROIDECTOMY      Family History  Problem Relation Age of Onset  . Heart attack Mother   . Alcohol abuse Mother   . Kidney cancer Father   . Hypertension Maternal Grandmother   . Esophageal cancer Neg Hx   . Colon cancer Neg Hx   . Pancreatic cancer Neg Hx   . Stomach cancer Neg Hx     Social History   Social History  . Marital status: Married    Spouse name: N/A  . Number of children: N/A  . Years of education: N/A   Social History Main Topics  . Smoking status: Never Smoker  . Smokeless tobacco: Never Used  . Alcohol use 0.6 oz/week    1 Glasses of wine per week     Comment: occasionally  . Drug use: No  . Sexual activity: Not Asked   Other Topics Concern  . 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  B Complex-C (B-COMPLEX WITH VITAMIN  C) tablet, Take 1 tablet by mouth every morning., Disp: , Rfl:  .  CALCIUM PO, Take 1,200 mg by mouth daily. , Disp: , Rfl:  .  cholecalciferol (VITAMIN D) 1000 UNITS tablet, Take 1,000 Units by mouth every morning. , Disp: , Rfl:  .  denosumab (PROLIA) 60 MG/ML SOLN injection, Inject 60 mg into the skin every 6 (six) months. Administer in upper arm, thigh, or abdomen, Disp: , Rfl:  .  levothyroxine (SYNTHROID, LEVOTHROID) 88 MCG tablet, TAKE 1 TABLET(88 MCG) BY MOUTH DAILY BEFORE BREAKFAST, Disp: 30 tablet, Rfl: 3 .  pantoprazole (PROTONIX) 20 MG tablet, TAKE 1 TABLET(20 MG) BY MOUTH DAILY BEFORE BREAKFAST, Disp: 90 tablet, Rfl: 0  EXAM:  Vitals:   03/21/17 1308  BP: 120/80  Pulse: 71  Temp: 98.4 F (36.9 C)    Body mass index is 31.99 kg/m.  GENERAL: vitals reviewed and listed above, alert, oriented, appears well hydrated and in no acute distress  HEENT: atraumatic, conjunttiva clear, no obvious abnormalities on inspection of external nose and ears  SKIN: 34 cm superficial skin tear on the right forearm, healing well  with good granulation tissue, clean wound and no signs of infection  MS: moves all extremities without noticeable abnormality  PSYCH: pleasant and cooperative, no obvious depression or anxiety  ASSESSMENT AND PLAN:  Discussed the following assessment and plan:  Open wound of right forearm, initial encounter -Wound care recs, appears to be healing well, return precautions -Wants her flu shot today -Patient advised to return or notify a doctor immediately if symptoms worsen or persist or new concerns arise.  Patient Instructions  BEFORE YOU LEAVE: - flu shot  Sterile petroleum jelly once daily  Keep clean - but uncover when able  Watch for signs of infection and seek care if need if swelling, increased redness, pus, increased pain, does not heal as expected or concerns   Colin Benton R., DO

## 2017-03-30 NOTE — Progress Notes (Signed)
Chief Complaint  Patient presents with  . Annual Exam    Pt has been doing ok   . Hypothyroidism    medication yearly evaluation    HPI: [name] [name] 76 y.o. come in for Chronic disease management  Yearly visit   Thyroid   Daily  Synthroid .    Seems to work well.  Osteoporosis just stated prolia  .    6-7 weeks  Off  Estrogen  Per gyne  gerd  On med per [employer] .    prtonix     Not any more.  Decreased dose  And now on 20 mg .  Sees derm  Hx skin cancer  ROS: See pertinent positives and negatives per HPI. No neuro sx fall or memory concerns   Animal exposure  hh of 2 and multiple pets   4   Rescue    Dogs   Feeds feral cats 17 but no touching ,  Hx of rabies shots in past Tobacco  etoh : ocass  Sleep  6 hours  .  No sugar .   Past Medical History:  Diagnosis Date  . Dog bite of thigh 04/09/2012  . Elevated lipids   . GERD (gastroesophageal reflux disease)   . Hypothyroidism    post thyroidectomy  . Osteopenia    t -2.5 hip in 2005 by [facility]  . Osteoporosis    commpr fx after dog related fall  injury  . Seasonal rhinitis    spring and fall  . Skin cancer    squamous cell ca chest  . Vaginal prolapse    hs corrected    Family History  Problem Relation Age of Onset  . Heart attack Mother   . Alcohol abuse Mother   . Kidney cancer Father   . Hypertension Maternal Grandmother   . Esophageal cancer Neg Hx   . Colon cancer Neg Hx   . Pancreatic cancer Neg Hx   . Stomach cancer Neg Hx     Social History   Social History  . Marital status: Married    Spouse name: N/A  . Number of children: N/A  . Years of education: N/A   Social History Main Topics  . Smoking status: Never Smoker  . Smokeless tobacco: Never Used  . Alcohol use 0.6 oz/week    1 Glasses of wine per week     Comment: occasionally  . Drug use: No  . Sexual activity: Not Asked   Other Topics Concern  . 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 CALCIUM PO Take 1,200 mg by mouth daily.     . cholecalciferol (VITAMIN D) 1000 UNITS tablet Take 1,000 Units by mouth every morning.     . denosumab (PROLIA) 60 MG/ML SOLN injection Inject 60 mg into the skin every 6 (six) months. Administer in upper arm, thigh, or abdomen    . levothyroxine (SYNTHROID, LEVOTHROID) 88 MCG tablet TAKE 1 TABLET(88 MCG) BY MOUTH DAILY BEFORE BREAKFAST 30 tablet 3  . pantoprazole (PROTONIX) 20 MG tablet TAKE 1 TABLET(20 MG) BY MOUTH DAILY BEFORE BREAKFAST 90 tablet 0  . B Complex-C (B-COMPLEX WITH VITAMIN C) tablet Take 1 tablet  by mouth every morning.     No facility-administered medications prior to visit.      EXAM:  BP 108/70 (BP Location: Left Arm, Patient Position: Sitting, Cuff Size: Normal)   Pulse 74   Temp 97.7 F (36.5 C) (Oral)   Ht 5' (1.524 m)   Wt 162 lb 12.8 oz (73.8 kg)   SpO2 97%   BMI 31.79 kg/m   Body mass index is 31.79 kg/m.  GENERAL: vitals reviewed and listed above, alert, oriented, appears well hydrated and in no acute distress HEENT: atraumatic, conjunctiva  clear, no obvious abnormalities on inspection of external nose and ears tms clear OP : no lesion edema or exudate  NECK: no obvious masses on inspection palpation  LUNGS: clear to auscultation bilaterally, no wheezes, rales or rhonchi, good air movement kyphosis   CV: HRRR, no clubbing cyanosis or  peripheral edema nl cap refill  No bruits heard  Breast: normal by inspection . No dimpling, discharge, masses, tenderness or discharge .= Abdomen:  Sof,t normal bowel sounds without hepatosplenomegaly, no guarding rebound or masses no CVA tenderness MS: moves all extremities without noticeable focal  Abnormality DJD changes  nuero non focal  Grossly  Skin healing  Scratch right arm  PSYCH: pleasant and  cooperative, no obvious depression or anxiety Lab Results  Component Value Date   WBC 7.8 11/17/2015   HGB 13.3 11/17/2015   HCT 40.0 11/17/2015   PLT 292.0 11/17/2015   GLUCOSE 83 11/17/2015   CHOL 182 10/27/2014   TRIG 74.0 10/27/2014   HDL 61.70 10/27/2014   LDLDIRECT 134.1 12/27/2006   LDLCALC 106 (H) 10/27/2014   ALT 19 11/17/2015   AST 29 11/17/2015   NA 141 11/17/2015   K 4.0 11/17/2015   CL 107 11/17/2015   CREATININE 0.77 11/17/2015   BUN 17 11/17/2015   CO2 28 11/17/2015   TSH 0.99 03/18/2016   INR 1.0 07/25/2007   BP Readings from Last 3 Encounters:  03/31/17 108/70  03/21/17 120/80  01/20/17 126/74    ASSESSMENT AND PLAN:  Discussed the following assessment and plan:  Hypothyroidism, unspecified type - Plan: Basic metabolic panel, CBC with Differential/Platelet, TSH  Age-related osteoporosis without current pathological fracture - on prolia per gyne just started  - Plan: Basic metabolic panel, CBC with Differential/Platelet, TSH  Medication management - Plan: Basic metabolic panel, CBC with Differential/Platelet, TSH  Gastroesophageal reflux disease, esophagitis presence not specified -  dr Anda Kraft dec dose  now on prn basis?  no se of med - Plan: Basic metabolic panel, CBC with Differential/Platelet, TSH Total visit 37mns > 50% spent counseling and coordinating care as indicated in above note and in instructions to patient .   Animal   Exposure   Pt aware  immuniz utd and disc shingrix also .  Getting enough vit d  -Patient advised to return or notify health care team  if  new concerns arise.  Patient Instructions  Continue lifestyle intervention healthy eating and exercise . Medication as planned .  Will notify you  of labs when available.    shingles vaccine. Shingrix  Can get at pharmacy  .     Preventive Care 629Years and Older, Female Preventive care refers to lifestyle choices and visits with your health care provider that can promote  health and wellness. What does preventive care include?  A yearly physical exam. This is also called an annual well check.  Dental exams once or twice a year.  Routine  eye exams. Ask your health care provider how often you should have your eyes checked.  Personal lifestyle choices, including: ? Daily care of your teeth and gums. ? Regular physical activity. ? Eating a healthy diet. ? Avoiding tobacco and drug use. ? Limiting alcohol use. ? Practicing safe sex. ? Taking low-dose aspirin every day. ? Taking vitamin and mineral supplements as recommended by your health care provider. What happens during an annual well check? The services and screenings done by your health care provider during your annual well check will depend on your age, overall health, lifestyle risk factors, and family history of disease. Counseling Your health care provider may ask you questions about your:  Alcohol use.  Tobacco use.  Drug use.  Emotional well-being.  Home and relationship well-being.  Sexual activity.  Eating habits.  History of falls.  Memory and ability to understand (cognition).  Work and work Statistician.  Reproductive health.  Screening You may have the following tests or measurements:  Height, weight, and BMI.  Blood pressure.  Lipid and cholesterol levels. These may be checked every 5 years, or more frequently if you are over 64 years old.  Skin check.  Lung cancer screening. You may have this screening every year starting at age 52 if you have a 30-pack-year history of smoking and currently smoke or have quit within the past 15 years.  Fecal occult blood test (FOBT) of the stool. You may have this test every year starting at age 85.  Flexible sigmoidoscopy or colonoscopy. You may have a sigmoidoscopy every 5 years or a colonoscopy every 10 years starting at age 63.  Hepatitis C blood test.  Hepatitis B blood test.  Sexually transmitted disease (STD)  testing.  Diabetes screening. This is done by checking your blood sugar (glucose) after you have not eaten for a while (fasting). You may have this done every 1-3 years.  Bone density scan. This is done to screen for osteoporosis. You may have this done starting at age 71.  Mammogram. This may be done every 1-2 years. Talk to your health care provider about how often you should have regular mammograms.  Talk with your health care provider about your test results, treatment options, and if necessary, the need for more tests. Vaccines Your health care provider may recommend certain vaccines, such as:  Influenza vaccine. This is recommended every year.  Tetanus, diphtheria, and acellular pertussis (Tdap, Td) vaccine. You may need a Td booster every 10 years.  Varicella vaccine. You may need this if you have not been vaccinated.  Zoster vaccine. You may need this after age 56.  Measles, mumps, and rubella (MMR) vaccine. You may need at least one dose of MMR if you were born in 1957 or later. You may also need a second dose.  Pneumococcal 13-valent conjugate (PCV13) vaccine. One dose is recommended after age 1.  Pneumococcal polysaccharide (PPSV23) vaccine. One dose is recommended after age 82.  Meningococcal vaccine. You may need this if you have certain conditions.  Hepatitis A vaccine. You may need this if you have certain conditions or if you travel or work in places where you may be exposed to hepatitis A.  Hepatitis B vaccine. You may need this if you have certain conditions or if you travel or work in places where you may be exposed to hepatitis B.  Haemophilus influenzae type b (Hib) vaccine. You may need this if you have certain conditions.  Talk to your health care provider about  which screenings and vaccines you need and how often you need them. This information is not intended to replace advice given to you by your health care provider. Make sure you discuss any questions  you have with your health care provider. Document Released: 07/10/2015 Document Revised: 03/02/2016 Document Reviewed: 04/14/2015 Elsevier Interactive Patient Education  2017 [place] K. Panosh M.D.

## 2017-03-31 ENCOUNTER — Ambulatory Visit (INDEPENDENT_AMBULATORY_CARE_PROVIDER_SITE_OTHER): Payer: Medicare Other | Admitting: Internal Medicine

## 2017-03-31 ENCOUNTER — Encounter: Payer: Self-pay | Admitting: Internal Medicine

## 2017-03-31 VITALS — BP 108/70 | HR 74 | Temp 97.7°F | Ht 60.0 in | Wt 162.8 lb

## 2017-03-31 DIAGNOSIS — M810 Age-related osteoporosis without current pathological fracture: Secondary | ICD-10-CM | POA: Diagnosis not present

## 2017-03-31 DIAGNOSIS — Z79899 Other long term (current) drug therapy: Secondary | ICD-10-CM | POA: Diagnosis not present

## 2017-03-31 DIAGNOSIS — E039 Hypothyroidism, unspecified: Secondary | ICD-10-CM | POA: Diagnosis not present

## 2017-03-31 DIAGNOSIS — K219 Gastro-esophageal reflux disease without esophagitis: Secondary | ICD-10-CM

## 2017-03-31 LAB — BASIC METABOLIC PANEL
BUN: 20 mg/dL (ref 6–23)
CHLORIDE: 104 meq/L (ref 96–112)
CO2: 27 mEq/L (ref 19–32)
Calcium: 9.5 mg/dL (ref 8.4–10.5)
Creatinine, Ser: 0.8 mg/dL (ref 0.40–1.20)
GFR: 74.19 mL/min (ref >60.00)
GLUCOSE: 88 mg/dL (ref 70–99)
POTASSIUM: 4.1 meq/L (ref 3.5–5.1)
Sodium: 140 mEq/L (ref 135–145)

## 2017-03-31 LAB — CBC WITH DIFFERENTIAL/PLATELET
Basophils Absolute: 0 10*3/uL (ref 0.0–0.1)
Basophils Relative: 0.2 % (ref 0.0–3.0)
EOS PCT: 5.8 % — AB (ref 0.0–5.0)
Eosinophils Absolute: 0.4 10*3/uL (ref 0.0–0.7)
HCT: 39 % (ref 36.0–46.0)
Hemoglobin: 13 g/dL (ref 12.0–15.0)
LYMPHS ABS: 2.4 10*3/uL (ref 0.7–4.0)
Lymphocytes Relative: 35.9 % (ref 12.0–46.0)
MCHC: 33.4 g/dL (ref 30.0–36.0)
MCV: 96.5 fl (ref 78.0–100.0)
MONOS PCT: 9.3 % (ref 3.0–12.0)
Monocytes Absolute: 0.6 10*3/uL (ref 0.1–1.0)
NEUTROS PCT: 48.8 % (ref 43.0–77.0)
Neutro Abs: 3.3 10*3/uL (ref 1.4–7.7)
Platelets: 294 10*3/uL (ref 150.0–400.0)
RBC: 4.04 Mil/uL (ref 3.87–5.11)
RDW: 14.2 % (ref 11.5–15.5)
WBC: 6.8 10*3/uL (ref 4.0–10.5)

## 2017-03-31 LAB — TSH: TSH: 3.72 u[IU]/mL (ref 0.35–4.50)

## 2017-03-31 MED ORDER — ZOSTER VAC RECOMB ADJUVANTED 50 MCG/0.5ML IM SUSR: 0.5000 mL | Freq: Once | INTRAMUSCULAR | 1 refills | Status: AC

## 2017-03-31 NOTE — Patient Instructions (Signed)
Continue lifestyle intervention healthy eating and exercise . Medication as planned .  Will notify you  of labs when available.    shingles vaccine. Shingrix  Can get at pharmacy  .     Preventive Care 4 Years and Older, Female Preventive care refers to lifestyle choices and visits with your health care provider that can promote health and wellness. What does preventive care include?  A yearly physical exam. This is also called an annual well check.  Dental exams once or twice a year.  Routine eye exams. Ask your health care provider how often you should have your eyes checked.  Personal lifestyle choices, including: ? Daily care of your teeth and gums. ? Regular physical activity. ? Eating a healthy diet. ? Avoiding tobacco and drug use. ? Limiting alcohol use. ? Practicing safe sex. ? Taking low-dose aspirin every day. ? Taking vitamin and mineral supplements as recommended by your health care provider. What happens during an annual well check? The services and screenings done by your health care provider during your annual well check will depend on your age, overall health, lifestyle risk factors, and family history of disease. Counseling Your health care provider may ask you questions about your:  Alcohol use.  Tobacco use.  Drug use.  Emotional well-being.  Home and relationship well-being.  Sexual activity.  Eating habits.  History of falls.  Memory and ability to understand (cognition).  Work and work Statistician.  Reproductive health.  Screening You may have the following tests or measurements:  Height, weight, and BMI.  Blood pressure.  Lipid and cholesterol levels. These may be checked every 5 years, or more frequently if you are over 71 years old.  Skin check.  Lung cancer screening. You may have this screening every year starting at age 51 if you have a 30-pack-year history of smoking and currently smoke or have quit within the past 15  years.  Fecal occult blood test (FOBT) of the stool. You may have this test every year starting at age 88.  Flexible sigmoidoscopy or colonoscopy. You may have a sigmoidoscopy every 5 years or a colonoscopy every 10 years starting at age 98.  Hepatitis C blood test.  Hepatitis B blood test.  Sexually transmitted disease (STD) testing.  Diabetes screening. This is done by checking your blood sugar (glucose) after you have not eaten for a while (fasting). You may have this done every 1-3 years.  Bone density scan. This is done to screen for osteoporosis. You may have this done starting at age 41.  Mammogram. This may be done every 1-2 years. Talk to your health care provider about how often you should have regular mammograms.  Talk with your health care provider about your test results, treatment options, and if necessary, the need for more tests. Vaccines Your health care provider may recommend certain vaccines, such as:  Influenza vaccine. This is recommended every year.  Tetanus, diphtheria, and acellular pertussis (Tdap, Td) vaccine. You may need a Td booster every 10 years.  Varicella vaccine. You may need this if you have not been vaccinated.  Zoster vaccine. You may need this after age 97.  Measles, mumps, and rubella (MMR) vaccine. You may need at least one dose of MMR if you were born in 1957 or later. You may also need a second dose.  Pneumococcal 13-valent conjugate (PCV13) vaccine. One dose is recommended after age 67.  Pneumococcal polysaccharide (PPSV23) vaccine. One dose is recommended after age 87.  Meningococcal  vaccine. You may need this if you have certain conditions.  Hepatitis A vaccine. You may need this if you have certain conditions or if you travel or work in places where you may be exposed to hepatitis A.  Hepatitis B vaccine. You may need this if you have certain conditions or if you travel or work in places where you may be exposed to hepatitis  B.  Haemophilus influenzae type b (Hib) vaccine. You may need this if you have certain conditions.  Talk to your health care provider about which screenings and vaccines you need and how often you need them. This information is not intended to replace advice given to you by your health care provider. Make sure you discuss any questions you have with your health care provider. Document Released: 07/10/2015 Document Revised: 03/02/2016 Document Reviewed: 04/14/2015 Elsevier Interactive Patient Education  2017 [employer].

## 2017-04-14 ENCOUNTER — Other Ambulatory Visit: Payer: Self-pay | Admitting: Internal Medicine

## 2017-04-18 DIAGNOSIS — D1801 Hemangioma of skin and subcutaneous tissue: Secondary | ICD-10-CM | POA: Diagnosis not present

## 2017-04-18 DIAGNOSIS — L905 Scar conditions and fibrosis of skin: Secondary | ICD-10-CM | POA: Diagnosis not present

## 2017-04-18 DIAGNOSIS — L821 Other seborrheic keratosis: Secondary | ICD-10-CM | POA: Diagnosis not present

## 2017-04-18 DIAGNOSIS — Z85828 Personal history of other malignant neoplasm of skin: Secondary | ICD-10-CM | POA: Diagnosis not present

## 2017-06-09 ENCOUNTER — Other Ambulatory Visit: Payer: Self-pay

## 2017-06-09 ENCOUNTER — Encounter: Payer: Self-pay | Admitting: Emergency Medicine

## 2017-06-09 ENCOUNTER — Emergency Department (INDEPENDENT_AMBULATORY_CARE_PROVIDER_SITE_OTHER)
Admission: EM | Admit: 2017-06-09 | Discharge: 2017-06-09 | Disposition: A | Discharge status: Home / Self Care | Payer: Medicare Other | Source: Home / Self Care | Attending: Family Medicine | Admitting: Family Medicine

## 2017-06-09 DIAGNOSIS — H9203 Otalgia, bilateral: Secondary | ICD-10-CM

## 2017-06-09 DIAGNOSIS — R059 Cough, unspecified: Secondary | ICD-10-CM

## 2017-06-09 DIAGNOSIS — R05 Cough: Secondary | ICD-10-CM

## 2017-06-09 DIAGNOSIS — J069 Acute upper respiratory infection, unspecified: Secondary | ICD-10-CM

## 2017-06-09 MED ORDER — BENZONATATE 100 MG PO CAPS: 100.0000 mg | ORAL_CAPSULE | Freq: Three times a day (TID) | ORAL | 0 refills | Status: DC

## 2017-06-09 MED ORDER — AZITHROMYCIN 250 MG PO TABS: 250.0000 mg | ORAL_TABLET | Freq: Every day | ORAL | 0 refills | Status: DC

## 2017-06-09 NOTE — ED Triage Notes (Signed)
Patient reports nasal drainage, ear discomfort and frequent non-productive cough for past 2-3 days with low grade to 101.8 temperature. Took 2 tylenol before coming in to [facility].

## 2017-06-09 NOTE — ED Provider Notes (Signed)
[name]    CSN: [id number] Arrival date & time: 06/09/17  1155     History   Chief Complaint Chief Complaint  Patient presents with  . Cough  . Otalgia  . Nasal Congestion    HPI [name] [name] is a 76 y.o. female.   HPI  [name] [name] is a 76 y.o. female presenting to UC with c/o 2-3 days of nasal drainage with bilateral ear fullness/pressure, non-productive hacking cough and fever Tmax 101.8*F.  Last dose of 2 Tylenol taken around 11AM. Her husband was dx and tx for bronchitis last week. Pt denies chest pain, SOB, n/v/d.    Past Medical History:  Diagnosis Date  . Dog bite of thigh 04/09/2012  . Elevated lipids   . GERD (gastroesophageal reflux disease)   . Hypothyroidism    post thyroidectomy  . Osteopenia    t -2.5 hip in 2005 by Dexa  . Osteoporosis    commpr fx after dog related fall  injury  . Seasonal rhinitis    spring and fall  . Skin cancer    squamous cell ca chest  . Vaginal prolapse    hs corrected    Patient Active Problem List   Diagnosis Date Noted  . Medication management 10/16/2013  . Medicare annual wellness visit, subsequent 07/16/2012  . Skin cancer   . Heart palpitations 2011-05-22  . Family history of sudden death 05/22/2011  . PALPITATIONS 03/31/2010  . BACK PAIN, LUMBAR 10/13/2009  . HEARTBURN 10/13/2009  . FREQUENCY, URINARY 10/13/2009  . FOOT PAIN 03/31/2008  . Hypothyroidism 01/25/2007  . Osteoporosis 01/25/2007  . OSTEOPENIA 01/25/2007    Past Surgical History:  Procedure Laterality Date  . ABDOMINAL HYSTERECTOMY  07/2007  . bladders sling surgery  07/2007  . COLONOSCOPY  2006  . THYROIDECTOMY      OB History    Gravida Para Term Preterm AB Living   1 1       1    SAB TAB Ectopic Multiple Live Births                   Home Medications    Prior to Admission medications   Medication Sig Start Date End Date Taking? Authorizing Provider  azithromycin (ZITHROMAX) 250 MG tablet Take 1 tablet (250  mg total) by mouth daily. Take first 2 tablets together, then 1 every day until finished. 06/09/17   Noe Gens, PA-C  benzonatate (TESSALON) 100 MG capsule Take 1-2 capsules (100-200 mg total) by mouth every 8 (eight) hours. 06/09/17   Noe Gens, PA-C  CALCIUM PO Take 1,200 mg by mouth daily.     [provider]  cholecalciferol (VITAMIN D) 1000 UNITS tablet Take 1,000 Units by mouth every morning.     [provider]  denosumab (PROLIA) 60 MG/ML SOLN injection Inject 60 mg into the skin every 6 (six) months. Administer in upper arm, thigh, or abdomen    [provider]  levothyroxine (SYNTHROID, LEVOTHROID) 88 MCG tablet TAKE 1 TABLET(88 MCG) BY MOUTH DAILY BEFORE BREAKFAST 02/02/17   Panosh, Standley Brooking, MD  pantoprazole (PROTONIX) 20 MG tablet TAKE 1 TABLET(20 MG) BY MOUTH DAILY BEFORE BREAKFAST 04/14/17   Gatha Mayer, MD  UNABLE TO FIND Med Name: Calcium    [provider]    Family History Family History  Problem Relation Age of Onset  . Heart attack Mother   . Alcohol abuse Mother   . Kidney cancer Father   .  Hypertension Maternal Grandmother   . Esophageal cancer Neg Hx   . Colon cancer Neg Hx   . Pancreatic cancer Neg Hx   . Stomach cancer Neg Hx     Social History Social History   Tobacco Use  . Smoking status: Never Smoker  . Smokeless tobacco: Never Used  Substance Use Topics  . Alcohol use: Yes    Alcohol/week: 0.6 oz    Types: 1 Glasses of wine per week    Comment: occasionally  . Drug use: No     Allergies   Patient has no known allergies.   Review of Systems Review of Systems  Constitutional: Positive for fatigue and fever. Negative for chills.  HENT: Positive for congestion, postnasal drip and sore throat ( scratchy). Negative for ear pain, trouble swallowing and voice change.   Respiratory: Positive for cough. Negative for shortness of breath.   Cardiovascular: Negative for chest pain and palpitations.    Gastrointestinal: Negative for abdominal pain, diarrhea, nausea and vomiting.  Musculoskeletal: Negative for arthralgias, back pain and myalgias.  Skin: Negative for rash.     Physical Exam Triage Vital Signs ED Triage Vitals  Enc Vitals Group     BP 06/09/17 1216 124/71     Pulse Rate 06/09/17 1216 92     Resp 06/09/17 1216 18     Temp 06/09/17 1216 99 F (37.2 C)     Temp Source 06/09/17 1216 Oral     SpO2 06/09/17 1216 94 %     Weight 06/09/17 1217 148 lb (67.1 kg)     Height 06/09/17 1217 5\' 3"  (1.6 m)     Head Circumference --      Peak Flow --      Pain Score 06/09/17 1232 0     Pain Loc --      Pain Edu? --      Excl. in [place]? --    No data found.  Updated Vital Signs BP 124/71 (BP Location: Right Arm)   Pulse 92   Temp 99 F (37.2 C) (Oral)   Resp 18   Ht 5\' 3"  (1.6 m)   Wt 148 lb (67.1 kg)   SpO2 94%   BMI 26.22 kg/m   Visual Acuity Right Eye Distance:   Left Eye Distance:   Bilateral Distance:    Right Eye Near:   Left Eye Near:    Bilateral Near:     Physical Exam  Constitutional: She is oriented to person, place, and time. She appears well-developed and well-nourished. No distress.  HENT:  Head: Normocephalic and atraumatic.  Right Ear: Tympanic membrane normal.  Left Ear: Tympanic membrane normal.  Nose: Mucosal edema present. Right sinus exhibits no maxillary sinus tenderness and no frontal sinus tenderness. Left sinus exhibits no maxillary sinus tenderness and no frontal sinus tenderness.  Mouth/Throat: Uvula is midline, oropharynx is clear and moist and mucous membranes are normal.  Eyes: EOM are normal.  Neck: Normal range of motion. Neck supple.  Cardiovascular: Normal rate and regular rhythm.  Pulmonary/Chest: Effort normal and breath sounds normal. No stridor. No respiratory distress. She has no wheezes. She has no rales.  Musculoskeletal: Normal range of motion.  Lymphadenopathy:    She has no cervical adenopathy.  Neurological: She  is alert and oriented to person, place, and time.  Skin: Skin is warm and dry. She is not diaphoretic.  Psychiatric: She has a normal mood and affect. Her behavior is normal.  Nursing note and vitals  reviewed.    UC Treatments / Results  Labs (all labs ordered are listed, but only abnormal results are displayed) Labs Reviewed - No data to display  EKG  EKG Interpretation None       Radiology No results found.  Procedures Procedures (including critical care time)  Medications Ordered in UC Medications - No data to display   Initial Impression / Assessment and Plan / UC Course  I have reviewed the triage vital signs and the nursing notes.  Pertinent labs & imaging results that were available during my care of the patient were reviewed by me and considered in my medical decision making (see chart for details).     Hx and exam most c/w viral illness, however, due to 3rd day of reported fever, and pt's age, will start pt on Azithromycin. Encouraged fluids and rest F/u with PCP in 1 week if not improving.   Final Clinical Impressions(s) / UC Diagnoses   Final diagnoses:  Upper respiratory tract infection, unspecified type  Cough  Otalgia of both ears    ED Discharge Orders        Ordered    azithromycin (ZITHROMAX) 250 MG tablet  Daily     06/09/17 1229    benzonatate (TESSALON) 100 MG capsule  Every 8 hours     06/09/17 1229       Controlled Substance Prescriptions [place] Controlled Substance Registry consulted? Not Applicable   Tyrell Antonio 06/09/17 1438

## 2017-06-11 ENCOUNTER — Telehealth: Payer: Self-pay | Admitting: Emergency Medicine

## 2017-06-11 NOTE — Telephone Encounter (Signed)
Patient states she is improving.

## 2017-06-23 ENCOUNTER — Other Ambulatory Visit: Payer: Self-pay | Admitting: Internal Medicine

## 2017-07-24 DIAGNOSIS — D3132 Benign neoplasm of left choroid: Secondary | ICD-10-CM | POA: Diagnosis not present

## 2017-07-24 DIAGNOSIS — H2513 Age-related nuclear cataract, bilateral: Secondary | ICD-10-CM | POA: Diagnosis not present

## 2017-07-28 ENCOUNTER — Telehealth: Payer: Self-pay | Admitting: Obstetrics & Gynecology

## 2017-07-28 NOTE — Telephone Encounter (Signed)
Deductible $185 ($0 met)    Annual exam 01/20/17  Calcium  9.5           Date 04/03/17  Upcoming dental procedures NO  Prior Authorization needed NO  Pt estimated Cost $0  Appt 08/25/17 at 11:00      Coverage Details:This is a Medicare [place] and it covers the Medicare Part B deductible, co-insurance and 100% of the excess charges.

## 2017-08-01 ENCOUNTER — Encounter: Payer: Self-pay | Admitting: Obstetrics & Gynecology

## 2017-08-01 DIAGNOSIS — Z1231 Encounter for screening mammogram for malignant neoplasm of breast: Secondary | ICD-10-CM | POA: Diagnosis not present

## 2017-08-08 DIAGNOSIS — L821 Other seborrheic keratosis: Secondary | ICD-10-CM | POA: Diagnosis not present

## 2017-08-08 DIAGNOSIS — L905 Scar conditions and fibrosis of skin: Secondary | ICD-10-CM | POA: Diagnosis not present

## 2017-08-08 DIAGNOSIS — Z85828 Personal history of other malignant neoplasm of skin: Secondary | ICD-10-CM | POA: Diagnosis not present

## 2017-08-08 DIAGNOSIS — L820 Inflamed seborrheic keratosis: Secondary | ICD-10-CM | POA: Diagnosis not present

## 2017-08-24 ENCOUNTER — Other Ambulatory Visit: Payer: Self-pay | Admitting: Internal Medicine

## 2017-08-24 MED ORDER — PANTOPRAZOLE SODIUM 20 MG PO TBEC: 20.0000 mg | DELAYED_RELEASE_TABLET | Freq: Every day | ORAL | 1 refills | Status: DC

## 2017-08-24 NOTE — Telephone Encounter (Signed)
Patient would like her provider, Dr. Shanon Ace, to take over this prescription.

## 2017-08-25 ENCOUNTER — Ambulatory Visit (INDEPENDENT_AMBULATORY_CARE_PROVIDER_SITE_OTHER): Payer: Medicare Other | Admitting: Anesthesiology

## 2017-08-25 ENCOUNTER — Encounter: Payer: Self-pay | Admitting: Obstetrics & Gynecology

## 2017-08-25 DIAGNOSIS — M810 Age-related osteoporosis without current pathological fracture: Secondary | ICD-10-CM | POA: Diagnosis not present

## 2017-08-25 MED ORDER — DENOSUMAB 60 MG/ML ~~LOC~~ SOLN
60.0000 mg | Freq: Once | SUBCUTANEOUS | Status: AC
  Administered 2017-08-25: 60 mg via SUBCUTANEOUS

## 2017-08-25 NOTE — Telephone Encounter (Signed)
Prolia given 08/25/17  Next injection date 02/26/18

## 2017-09-11 DIAGNOSIS — L905 Scar conditions and fibrosis of skin: Secondary | ICD-10-CM | POA: Diagnosis not present

## 2017-09-11 DIAGNOSIS — L821 Other seborrheic keratosis: Secondary | ICD-10-CM | POA: Diagnosis not present

## 2017-09-11 DIAGNOSIS — Z85828 Personal history of other malignant neoplasm of skin: Secondary | ICD-10-CM | POA: Diagnosis not present

## 2017-09-11 DIAGNOSIS — L98491 Non-pressure chronic ulcer of skin of other sites limited to breakdown of skin: Secondary | ICD-10-CM | POA: Diagnosis not present

## 2017-09-15 ENCOUNTER — Telehealth: Payer: Self-pay | Admitting: Internal Medicine

## 2017-09-15 NOTE — Telephone Encounter (Unsigned)
Copied from [place] [id number]. Topic: Quick Communication - See Telephone Encounter >> Sep 15, 2017 12:42 PM Hewitt Shorts wrote: CRM for notification. See Telephone encounter for: 09/15/17.pt got a call stating to call back and make a AWV appt but no CRM is in the Chart   Best [phone]

## 2017-09-26 NOTE — Progress Notes (Addendum)
Subjective:   [name] [name] is a 77 y.o. female who presents for Medicare Annual (Subsequent) preventive examination.  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   Colo guard x 06/22/2016; no plans to repeat Mammogram 07/2016 Has had one at [place] 03/2016  -2.80 (taking prolia via GYN)  Prolia x 2 years  Stepped off the curb and fell flat on her face Also still works and walks all day long Works 5 days a week    Eye exams Dr. Susa Simmonds, [place]   Helps her spouse a lot with COPD, CHF and DM  She has restricted his sodium in his diet  He has lost 10 lbs  Given DASH diet for information    Cardiac Risk Factors include: advanced age (>88men, >44 women);family history of premature cardiovascular disease;obesity (BMI >30kg/m2)Colonoscopy 06/2004    Objective:     Vitals: BP 128/70   Pulse 71   Ht 5\' 1"  (1.549 m)   Wt 164 lb (74.4 kg)   SpO2 96%   BMI 30.99 kg/m   Body mass index is 30.99 kg/m.  Advanced Directives 09/27/2017  Does Patient Have a Medical Advance Directive? Yes    Tobacco Social History   Tobacco Use  Smoking Status Never Smoker  Smokeless Tobacco Never Used     Counseling given: Yes   Clinical Intake:     Past Medical History:  Diagnosis Date  . Dog bite of thigh 04/09/2012  . Elevated lipids   . GERD (gastroesophageal reflux disease)   . Hypothyroidism    post thyroidectomy  . Osteopenia    t -2.5 hip in 2005 by Dexa  . Osteoporosis    commpr fx after dog related fall   injury  . Seasonal rhinitis    spring and fall  . Skin cancer    squamous cell ca chest  . Vaginal prolapse    hs corrected   Past Surgical History:  Procedure Laterality Date  . ABDOMINAL HYSTERECTOMY  07/2007  . bladders sling surgery  07/2007  . COLONOSCOPY  2006  . THYROIDECTOMY     Family History  Problem Relation Age of Onset  . Heart attack Mother   . Alcohol abuse Mother   . Kidney cancer Father   . Hypertension Maternal Grandmother   . Esophageal cancer Neg Hx   . Colon cancer Neg Hx   . Pancreatic cancer Neg Hx   . Stomach cancer Neg Hx    Social History   Socioeconomic History  . Marital status: Married    Spouse name: Not on file  . Number of children: Not on file  . Years of education: Not on file  . Highest education level: Not on file  Occupational History  . Not on file  Social Needs  . Financial resource strain: Not on file  . Food insecurity:    Worry: Not on file    Inability: Not on file  . Transportation  needs:    Medical: Not on file    Non-medical: Not on file  Tobacco Use  . Smoking status: Never Smoker  . Smokeless tobacco: Never Used  Substance and Sexual Activity  . Alcohol use: Yes    Alcohol/week: 0.6 oz    Types: 1 Glasses of wine per week    Comment: occasionally  . Drug use: No  . Sexual activity: Not on file  Lifestyle  . Physical activity:    Days per week: Not on file    Minutes per session: Not on file  . Stress: Not on file  Relationships  . Social connections:    Talks on phone: Not on file    Gets together: Not on file    Attends religious service: Not on file    Active member of club or organization: Not on file    Attends meetings of clubs or organizations: Not on file    Relationship status: Not on file  Other Topics Concern  . 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 b complex vitamins tablet Take 1 tablet by mouth daily.  Marland Kitchen CALCIUM PO Take 1,200 mg by mouth daily.   . cholecalciferol (VITAMIN D) 1000 UNITS tablet Take 1,000 Units by mouth every morning.   . denosumab (PROLIA) 60 MG/ML SOLN injection Inject 60 mg into the skin every 6 (six) months. Administer in upper arm, thigh, or abdomen  . pantoprazole (PROTONIX) 20 MG tablet Take 1 tablet (20 mg total) by mouth daily.  Marland Kitchen SYNTHROID 88 MCG tablet TAKE 1 TABLET(88 MCG) BY MOUTH DAILY BEFORE BREAKFAST  . UNABLE TO FIND Med Name: Calcium  . [DISCONTINUED] azithromycin (ZITHROMAX) 250 MG tablet Take 1 tablet (250 mg total) by mouth daily. Take first 2 tablets together, then 1 every day until finished.  . [DISCONTINUED] benzonatate (TESSALON) 100 MG capsule Take 1-2 capsules (100-200 mg total) by mouth every 8 (eight) hours.   No facility-administered encounter medications on file as of 09/27/2017.     Activities of Daily Living In your present state of health, do you have any difficulty performing the following activities: 09/27/2017  Hearing? N  Vision? N  Difficulty concentrating or making decisions? N  Walking or climbing stairs? N  Dressing or bathing? N  Doing errands, shopping? N  Preparing Food and eating ? N  Using the Toilet? N  In the past six months, have you accidently leaked urine? N  Do you have problems with loss of bowel control? N  Managing your Medications? N  Managing your Finances? N  Housekeeping or managing your Housekeeping? N  Some recent data might be hidden    Patient Care Team: Panosh, Standley Brooking, MD as PCP - General Princess Bruins, MD as Attending Physician (Obstetrics and Gynecology) Addison Lank, MD as Referring Physician (Dermatology) [facility]     Assessment:   This is a routine wellness examination for [name] [name].  Exercise Activities and Dietary recommendations Current Exercise Habits:  Structured exercise class, Type of exercise: walking;stretching;strength training/weights, Time (Minutes): 60(also has 4 rescue dogs and walks them every am and pm), Frequency (Times/Week): 6, Weekly Exercise (Minutes/Week): 360, Intensity: Moderate  Goals    .  Patient Stated     Keep active and enjoying and your life        Fall Risk Fall Risk  09/27/2017 03/31/2017 12/18/2015 11/11/2015 10/27/2014  Falls in the past year? Yes No No No No  Comment - - - - -  Number falls in past yr: 1 - - - -  Injury with Fall? Yes - - - -  Follow up Education provided - - - -     Depression Screen PHQ 2/9 Scores 09/27/2017 03/31/2017 12/18/2015 11/11/2015  PHQ - 2 Score 0 0 0 0     Cognitive Function MMSE - Mini Mental State Exam 09/27/2017  Not completed: (No Data)     Ad8 score reviewed for issues:  Issues making decisions:  Less interest in hobbies / activities:  Repeats questions, stories (family complaining):  Trouble using ordinary gadgets (microwave, computer, phone):  Forgets the month or year:   Mismanaging finances:   Remembering appts:  Daily problems with thinking and/or memory: Ad8 score is= 0  Has a friend with some memory issues    Immunization History  Administered Date(s) Administered  . Influenza Split 03/08/2011, 03/20/2012  . Influenza Whole 05/09/2007, 03/31/2008, 03/19/2010  . Influenza, High Dose Seasonal PF 03/18/2016, 03/21/2017  . Influenza,inj,Quad PF,6+ Mos 04/04/2014  . Pneumococcal Conjugate-13 10/16/2013  . Pneumococcal Polysaccharide-23 06/27/2001, 01/15/2009  . Rabies, IM 11/11/2015, 11/18/2015, 11/22/2015, 11/29/2015  . Td 12/27/2006  . Tdap 11/11/2015  . Zoster 03/31/2010      Screening Tests Health Maintenance  Topic Date Due  . INFLUENZA VACCINE  01/25/2018  . TETANUS/TDAP  11/10/2025  . DEXA SCAN  Completed  . PNA vac Low Risk Adult  Completed         Plan:      PCP Notes   Health Maintenance Dexa and mammogram followed by  Dr. Dellis Filbert.  States mammogram was repeated in feb 19   Educated regarding shingrix. Stated she has not has this but Dr. Regis Bill discussed with her last year.  States she has a colo guard 06/22/2016 No plans to repeat due to age   Abnormal Screens  None  Referrals  none  Patient concerns; Is overweight but is very active. Walks dogs every am and pm and has a Physiological scientist one time a week Would like to have her lipids drawn again  Nurse Concerns; Eats out most evening Helps her spouse a lot with COPD, CHF and DM  She has restricted his sodium in his diet  He has lost 10 lbs  Given DASH diet for information  Also BMI 28; feels good    Next PCP apt  10/31/17 at 10am for medical fup and labs    I have personally reviewed and noted the following in the patient's chart:   . Medical and social history . Use of alcohol, tobacco or illicit drugs  . Current medications and supplements . Functional ability and status . Nutritional status . Physical activity . Advanced directives . List of other physicians . Hospitalizations, surgeries, and ER visits in previous 12 months . Vitals . Screenings to include cognitive, depression, and falls . Referrals and appointments  In addition, I have reviewed and discussed with patient certain preventive protocols, quality metrics, and best practice recommendations. A written personalized care plan for preventive services as well as general preventive health recommendations were provided to patient.     MCNOB,SJGGE, RN  09/27/2017    Above noted reviewed and agree. Shanon Ace, MD

## 2017-09-27 ENCOUNTER — Ambulatory Visit (INDEPENDENT_AMBULATORY_CARE_PROVIDER_SITE_OTHER): Payer: Medicare Other

## 2017-09-27 VITALS — BP 128/70 | HR 71 | Ht 61.0 in | Wt 164.0 lb

## 2017-09-27 DIAGNOSIS — Z0000 Encounter for general adult medical examination without abnormal findings: Secondary | ICD-10-CM

## 2017-09-27 NOTE — Patient Instructions (Addendum)
[name] [name] , Thank you for taking time to come for your Medicare Wellness Visit. I appreciate your ongoing commitment to your health goals. Please review the following plan we discussed and let me know if I can assist you in the future.   Had mammogram this year - followed by GYN Dexa followed the GYN   Educated to check with insurance regarding coverage of Shingles vaccination on Part D or Part B and may have lower co-pay if provided on the Part D side Shingrix is a vaccine for the prevention of Shingles in Adults 50 and older. Please check with your benefits regarding applicable copays or out of pocket expenses.  The Shingrix is given in 2 vaccines approx 8 weeks apart. You must receive the 2nd dose prior to 6 months from receipt of the first.    These are the goals we discussed: Goals    . Patient Stated     Keep active and enjoying and your life        This is a list of the screening recommended for you and due dates:  Health Maintenance  Topic Date Due  . Flu Shot  01/25/2018  . Tetanus Vaccine  11/10/2025  . DEXA scan (bone density measurement)  Completed  . Pneumonia vaccines  Completed    Prevention of falls: Remove rugs or any tripping hazards in the home Use Non slip mats in bathtubs and showers Placing grab bars next to the toilet and or shower Placing handrails on both sides of the stair way Adding extra lighting in the home.   Personal safety issues reviewed:  1. Consider starting a community watch program per [facility] 2.  Changes batteries is smoke detector and/or carbon monoxide detector  3.  If you have firearms; keep them in a safe place 4.  Wear protection when in the sun; Always wear sunscreen or a hat; It is good to have your doctor check your skin annually or review any new areas of concern 5. Driving safety; Keep in the right lane; stay 3 car lengths behind the car in front of you on the highway; look 3 times prior to pulling out; carry  your cell phone everywhere you go!    Learn about the Yellow Dot program:  The program allows first responders at your emergency to have access to who your physician is, as well as your medications and medical conditions.  Citizens requesting the Yellow Dot Packages should contact Master Corporal Nunzio Cobbs at the [facility] [phone] for the first week of the program and beginning the week after Easter citizens should contact their Scientist, physiological.      Summary: Preventive Care for Adults  A healthy lifestyle and preventive care can promote health and wellness. Preventive health guidelines for adults include the following key practices.  . A routine yearly physical is a good way to check with your health care provider about your health and preventive screening. It is a chance to share any concerns and updates on your health and to receive a thorough exam.  . Visit your dentist for a routine exam and preventive care every 6 months. Brush your teeth twice a day and floss once a day. Good oral hygiene prevents tooth decay and gum disease.  . The frequency of eye exams is based on your age, health, family medical history, use  of contact lenses, and other factors. Follow your health care provider's ecommendations for frequency of eye exams.  Marland Kitchen  Eat a healthy diet. Foods like vegetables, fruits, whole grains, low-fat dairy products, and lean protein foods contain the nutrients you need without too many calories. Decrease your intake of foods high in solid fats, added sugars, and salt. Eat the right amount of calories for you. Get information about a proper diet from your health care provider, if necessary.  . Regular physical exercise is one of the most important things you can do for your health. Most adults should get at least 150 minutes of moderate-intensity exercise (any activity that increases your heart rate and causes you to sweat) each week.  In addition, most adults need muscle-strengthening exercises on 2 or more days a week.  Silver Sneakers may be a benefit available to you. To determine eligibility, you may visit the website: www.silversneakers.com or contact program at [phone] Mon-Fri between 8AM-8PM.   . Maintain a healthy weight. The body mass index (BMI) is a screening tool to identify possible weight problems. It provides an estimate of body fat based on height and weight. Your health care provider can find your BMI and can help you achieve or maintain a healthy weight.   For adults 20 years and older: ? A BMI below 18.5 is considered underweight. ? A BMI of 18.5 to 24.9 is normal. ? A BMI of 27 to 28 is considered normal by the Institutes of Health  ? A BMI of 30 and above is considered obese.   . Maintain normal blood lipids and cholesterol levels by exercising and minimizing your intake of saturated fat. Eat a balanced diet with plenty of fruit and vegetables. Blood tests for lipids and cholesterol should begin at age 61 and be repeated every 5 years. If your lipid or cholesterol levels are high, you are over 50, or you are at high risk for heart disease, you may need your cholesterol levels checked more frequently. Ongoing high lipid and cholesterol levels should be treated with medicines if diet and exercise are not working.  . If you smoke, find out from your health care provider how to quit. If you do not use tobacco, please do not start.  . If you choose to drink alcohol, please do not consume more than one drink for women and 2 for men.  One drink is considered to be 12 ounces (355 mL) of beer, 5 ounces (148 mL) of wine, or 1.5 ounces (44 mL) of liquor. Moderation of alcohol intake to this level decreases your risk of breast cancer and liver damage.   . If you are 90-38 years old, ask your health care provider if you should take aspirin to prevent strokes.  . Use sunscreen. Apply sunscreen  liberally and repeatedly throughout the day. You should seek shade when your shadow is shorter than you. Protect yourself by wearing long sleeves, pants, a wide-brimmed hat, and sunglasses year round, whenever you are outdoors.  . Once a month, do a whole body skin exam, using a mirror to look at the skin on your back. Tell your health care provider of new moles, moles that have irregular borders, moles that are larger than a pencil eraser, or moles that have changed in shape or color.  Last, if you have completed an Advanced Directive; please bring a copy and review with your physician and then we will scan to the medical record     [place] stands for "Dietary Approaches to Stop Hypertension." The DASH eating plan is a healthy eating plan that has been  shown to reduce high blood pressure (hypertension). It may also reduce your risk for type 2 diabetes, heart disease, and stroke. The DASH eating plan may also help with weight loss. What are tips for following this plan? General guidelines  Avoid eating more than 2,300 mg (milligrams) of salt (sodium) a day. If you have hypertension, you may need to reduce your sodium intake to 1,500 mg a day.  Limit alcohol intake to no more than 1 drink a day for nonpregnant women and 2 drinks a day for men. One drink equals 12 oz of beer, 5 oz of wine, or 1 oz of hard liquor.  Work with your health care provider to maintain a healthy body weight or to lose weight. Ask what an ideal weight is for you.  Get at least 30 minutes of exercise that causes your heart to beat faster (aerobic exercise) most days of the week. Activities may include walking, swimming, or biking.  Work with your health care provider or diet and nutrition specialist (dietitian) to adjust your eating plan to your individual calorie needs. Reading food labels  Check food labels for the amount of sodium per serving. Choose foods with less than 5 percent of the Daily Value  of sodium. Generally, foods with less than 300 mg of sodium per serving fit into this eating plan.  To find whole grains, look for the word "whole" as the first word in the ingredient list. Shopping  Buy products labeled as "low-sodium" or "no salt added."  Buy fresh foods. Avoid canned foods and premade or frozen meals. Cooking  Avoid adding salt when cooking. Use salt-free seasonings or herbs instead of table salt or sea salt. Check with your health care provider or pharmacist before using salt substitutes.  Do not fry foods. Cook foods using healthy methods such as baking, boiling, grilling, and broiling instead.  Cook with heart-healthy oils, such as olive, canola, soybean, or sunflower oil. Meal planning   Eat a balanced diet that includes: ? 5 or more servings of fruits and vegetables each day. At each meal, try to fill half of your plate with fruits and vegetables. ? Up to 6-8 servings of whole grains each day. ? Less than 6 oz of lean meat, poultry, or fish each day. A 3-oz serving of meat is about the same size as a deck of cards. One egg equals 1 oz. ? 2 servings of low-fat dairy each day. ? A serving of nuts, seeds, or beans 5 times each week. ? Heart-healthy fats. Healthy fats called Omega-3 fatty acids are found in foods such as flaxseeds and coldwater fish, like sardines, salmon, and mackerel.  Limit how much you eat of the following: ? Canned or prepackaged foods. ? Food that is high in trans fat, such as fried foods. ? Food that is high in saturated fat, such as fatty meat. ? Sweets, desserts, sugary drinks, and other foods with added sugar. ? Full-fat dairy products.  Do not salt foods before eating.  Try to eat at least 2 vegetarian meals each week.  Eat more home-cooked food and less restaurant, buffet, and fast food.  When eating at a restaurant, ask that your food be prepared with less salt or no salt, if possible. What foods are recommended? The items  listed may not be a complete list. Talk with your dietitian about what dietary choices are best for you. Grains Whole-grain or whole-wheat bread. Whole-grain or whole-wheat pasta. Brown rice. [place] Morrow. Bulgur. Whole-grain and  low-sodium cereals. Pita bread. Low-fat, low-sodium crackers. Whole-wheat flour tortillas. Vegetables Fresh or frozen vegetables (raw, steamed, roasted, or grilled). Low-sodium or reduced-sodium tomato and vegetable juice. Low-sodium or reduced-sodium tomato sauce and tomato paste. Low-sodium or reduced-sodium canned vegetables. Fruits All fresh, dried, or frozen fruit. Canned fruit in natural juice (without added sugar). Meat and other protein foods Skinless chicken or [place]. Ground chicken or [place]. Pork with fat trimmed off. Fish and seafood. Egg whites. Dried beans, peas, or lentils. Unsalted nuts, nut butters, and seeds. Unsalted canned beans. Lean cuts of beef with fat trimmed off. Low-sodium, lean deli meat. Dairy Low-fat (1%) or fat-free (skim) milk. Fat-free, low-fat, or reduced-fat cheeses. Nonfat, low-sodium ricotta or cottage cheese. Low-fat or nonfat yogurt. Low-fat, low-sodium cheese. Fats and oils Soft margarine without trans fats. Vegetable oil. Low-fat, reduced-fat, or light mayonnaise and salad dressings (reduced-sodium). Canola, safflower, olive, soybean, and sunflower oils. Avocado. Seasoning and other foods Herbs. Spices. Seasoning mixes without salt. Unsalted popcorn and pretzels. Fat-free sweets. What foods are not recommended? The items listed may not be a complete list. Talk with your dietitian about what dietary choices are best for you. Grains Baked goods made with fat, such as croissants, muffins, or some breads. Dry pasta or rice meal packs. Vegetables Creamed or fried vegetables. Vegetables in a cheese sauce. Regular canned vegetables (not low-sodium or reduced-sodium). Regular canned tomato sauce and paste (not low-sodium or  reduced-sodium). Regular tomato and vegetable juice (not low-sodium or reduced-sodium). Angie Fava. Olives. Fruits Canned fruit in a light or heavy syrup. Fried fruit. Fruit in cream or butter sauce. Meat and other protein foods Fatty cuts of meat. Ribs. Fried meat. Berniece Salines. Sausage. Bologna and other processed lunch meats. Salami. Fatback. Hotdogs. Bratwurst. Salted nuts and seeds. Canned beans with added salt. Canned or smoked fish. Whole eggs or egg yolks. Chicken or [place] with skin. Dairy Whole or 2% milk, cream, and half-and-half. Whole or full-fat cream cheese. Whole-fat or sweetened yogurt. Full-fat cheese. Nondairy creamers. Whipped toppings. Processed cheese and cheese spreads. Fats and oils Butter. Stick margarine. Lard. Shortening. Ghee. Bacon fat. Tropical oils, such as coconut, palm kernel, or palm oil. Seasoning and other foods Salted popcorn and pretzels. Onion salt, garlic salt, seasoned salt, table salt, and sea salt. Worcestershire sauce. Tartar sauce. Barbecue sauce. Teriyaki sauce. Soy sauce, including reduced-sodium. Steak sauce. Canned and packaged gravies. Fish sauce. Oyster sauce. Cocktail sauce. Horseradish that you find on the shelf. Ketchup. Mustard. Meat flavorings and tenderizers. Bouillon cubes. Hot sauce and Tabasco sauce. Premade or packaged marinades. Premade or packaged taco seasonings. Relishes. Regular salad dressings. Where to find more information:  National Heart, Lung, and [place]: https://wilson-eaton.com/  American Heart Association: www.heart.org Summary  The DASH eating plan is a healthy eating plan that has been shown to reduce high blood pressure (hypertension). It may also reduce your risk for type 2 diabetes, heart disease, and stroke.  With the DASH eating plan, you should limit salt (sodium) intake to 2,300 mg a day. If you have hypertension, you may need to reduce your sodium intake to 1,500 mg a day.  When on the DASH eating plan, aim to eat more  fresh fruits and vegetables, whole grains, lean proteins, low-fat dairy, and heart-healthy fats.  Work with your health care provider or diet and nutrition specialist (dietitian) to adjust your eating plan to your individual calorie needs. This information is not intended to replace advice given to you by your health care provider. Make sure you discuss  any questions you have with your health care provider. Document Released: 06/02/2011 Document Revised: 06/06/2016 Document Reviewed: 06/06/2016 Elsevier Interactive Patient Education  [employer].

## 2017-10-30 NOTE — Progress Notes (Signed)
Chief Complaint  Patient presents with  . Follow-up    Lipid testing per AWV and patient    HPI: [name] [name] 77 y.o. come in for wanting to get lipids checked   Last  Visit wit hme  October 18   Saw [facility] for wellnes and wanted to dheck lipid panel? Other issues  Has been on thyroid med for  A wheil and in range  also on prolia for  Bone health osteoporosis sees  Personal trainer and  Calcium   Almond milk And  Controlled  gerd caretake for husband cv chf  helping diet changes  Family hx of     Mom died  Young   42 unknown cause had some   Swelling  In ankles  And father had cancer   renal  Brother   81 CM.  Avid smoker .  ROS: See pertinent positives and negatives per HPI.  Past Medical History:  Diagnosis Date  . Dog bite of thigh 04/09/2012  . Elevated lipids   . GERD (gastroesophageal reflux disease)   . Hypothyroidism    post thyroidectomy  . Osteopenia    t -2.5 hip in 2005 by [facility]  . Osteoporosis    commpr fx after dog related fall  injury  . Seasonal rhinitis    spring and fall  . Skin cancer    squamous cell ca chest  . Vaginal prolapse    hs corrected    Family History  Problem Relation Age of Onset  . Heart attack Mother   . Alcohol abuse Mother   . Kidney cancer Father   . Hypertension Maternal Grandmother   . Esophageal cancer Neg Hx   . Colon cancer Neg Hx   . Pancreatic cancer Neg Hx   . Stomach cancer Neg Hx     Social History   Socioeconomic History  . Marital status: Married    Spouse name: Not on file  . Number of children: Not on file  . Years of education: Not on file  . Highest education level: Not on file  Occupational History  . Not on file  Social Needs  . Financial resource strain: Not on file  . Food insecurity:    Worry: Not on file    Inability: Not on file  . Transportation needs:    Medical: Not on file    Non-medical: Not on file  Tobacco Use  . Smoking status: Never Smoker  . Smokeless tobacco: Never Used    Substance and Sexual Activity  . Alcohol use: Yes    Alcohol/week: 0.6 oz    Types: 1 Glasses of wine per week    Comment: occasionally  . Drug use: No  . Sexual activity: Not on file  Lifestyle  . Physical activity:    Days per week: Not on file    Minutes per session: Not on file  . Stress: Not on file  Relationships  . Social connections:    Talks on phone: Not on file    Gets together: Not on file    Attends religious service: Not on file    Active member of club or organization: Not on file    Attends meetings of clubs or organizations: Not on file    Relationship status: Not on file  Other Topics Concern  . 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 b complex vitamins tablet Take 1 tablet by mouth daily.    Marland Kitchen CALCIUM PO Take 1,200 mg by mouth daily.     . cholecalciferol (VITAMIN D) 1000 UNITS tablet Take 1,000 Units by mouth every morning.     . denosumab (PROLIA) 60 MG/ML SOLN injection Inject 60 mg into the skin every 6 (six) months. Administer in upper arm, thigh, or abdomen    . pantoprazole (PROTONIX) 20 MG tablet Take 1 tablet (20 mg total) by mouth daily. 90 tablet 1  . SYNTHROID 88 MCG tablet TAKE 1 TABLET(88 MCG) BY MOUTH DAILY BEFORE BREAKFAST 30 tablet 6  . UNABLE TO FIND Med Name: Calcium     No facility-administered medications prior to visit.      EXAM:  BP 122/60 (BP Location: Right Arm, Patient Position: Sitting, Cuff Size: Normal)   Pulse 67   Temp 97.8 F (36.6 C) (Oral)   Wt 165 lb 14.4 oz (75.3 kg)   BMI 31.35 kg/m   Body mass index is 31.35 kg/m.  GENERAL: vitals reviewed and listed above, alert, oriented, appears well hydrated and in no acute distress HEENT: atraumatic, conjunctiva  clear, no obvious abnormalities on inspection of  external nose and ears  Kyoosis loioks well   Nl gait   MS: moves all extremities without noticeable focal  abnormality PSYCH: pleasant and cooperative, no obvious depression or anxiety Lab Results  Component Value Date   WBC 6.8 03/31/2017   HGB 13.0 03/31/2017   HCT 39.0 03/31/2017   PLT 294.0 03/31/2017   GLUCOSE 88 03/31/2017   CHOL 222 (H) 10/31/2017   TRIG 82.0 10/31/2017   HDL 60.00 10/31/2017   LDLDIRECT 134.1 12/27/2006   LDLCALC 145 (H) 10/31/2017   ALT 18 10/31/2017   AST 30 10/31/2017   NA 140 03/31/2017   K 4.1 03/31/2017   CL 104 03/31/2017   CREATININE 0.80 03/31/2017   BUN 20 03/31/2017   CO2 27 03/31/2017   TSH 3.72 03/31/2017   INR 1.0 07/25/2007   BP Readings from Last 3 Encounters:  10/31/17 122/60  09/27/17 128/70  06/09/17 124/71   Wt Readings from Last 3 Encounters:  10/31/17 165 lb 14.4 oz (75.3 kg)  09/27/17 164 lb (74.4 kg)  06/09/17 148 lb (67.1 kg)     ASSESSMENT AND PLAN:  Discussed the following assessment and plan:  Medication management - Plan: Lipid panel, Hepatic function panel  Need for lipid screening - Plan: Lipid panel, Hepatic function panel  Hypothyroidism, unspecified type - Plan: Lipid panel, Hepatic function panel  Osteoporosis, unspecified osteoporosis type, unspecified pathological fracture presence - Plan: Lipid panel, Hepatic function panel Eating pretty healthy   ostesoporosis  Biggest risk but mom did have sd >? Cause   screenign lipoids at this time   And lvier  If all ok then yearly chek in the fall  -Patient advised to return or notify health care team  if  new concerns arise. Total visit 50mins > 50% spent counseling and coordinating care as indicated in above note and in instructions to patient .      Patient Instructions  Mediterranean. Eating.  Is the best  .   For heart health .        Mediterranean Diet A Mediterranean diet refers to  food and lifestyle choices that are based on the traditions of  countries located on the [employer]. This way of eating has been shown to help prevent certain conditions and improve outcomes for people who have chronic diseases, like kidney disease and heart disease. What are tips for following this plan? Lifestyle  Cook and eat meals together with your family, when possible.  Drink enough fluid to keep your urine clear or pale yellow.  Be physically active every day. This includes: ? Aerobic exercise like running or swimming. ? Leisure activities like gardening, walking, or housework.  Get 7-8 hours of sleep each night.  If recommended by your health care provider, drink red wine in moderation. This means 1 glass a day for nonpregnant women and 2 glasses a day for men. A glass of wine equals 5 oz (150 mL). Reading food labels  Check the serving size of packaged foods. For foods such as rice and pasta, the serving size refers to the amount of cooked product, not dry.  Check the total fat in packaged foods. Avoid foods that have saturated fat or trans fats.  Check the ingredients list for added sugars, such as corn syrup. Shopping  At the grocery store, buy most of your food from the areas near the walls of the store. This includes: ? Fresh fruits and vegetables (produce). ? Grains, beans, nuts, and seeds. Some of these may be available in unpackaged forms or large amounts (in bulk). ? Fresh seafood. ? Poultry and eggs. ? Low-fat dairy products.  Buy whole ingredients instead of prepackaged foods.  Buy fresh fruits and vegetables in-season from local farmers markets.  Buy frozen fruits and vegetables in resealable bags.  If you do not have access to quality fresh seafood, buy precooked frozen shrimp or canned fish, such as tuna, salmon, or sardines.  Buy small amounts of raw or cooked vegetables, salads, or olives from the deli or salad bar at your store.  Stock your pantry so you always have certain foods on hand, such as olive  oil, canned tuna, canned tomatoes, rice, pasta, and beans. Cooking  Cook foods with extra-virgin olive oil instead of using butter or other vegetable oils.  Have meat as a side dish, and have vegetables or grains as your main dish. This means having meat in small portions or adding small amounts of meat to foods like pasta or stew.  Use beans or vegetables instead of meat in common dishes like chili or lasagna.  Experiment with different cooking methods. Try roasting or broiling vegetables instead of steaming or sauteing them.  Add frozen vegetables to soups, stews, pasta, or rice.  Add nuts or seeds for added healthy fat at each meal. You can add these to yogurt, salads, or vegetable dishes.  Marinate fish or vegetables using olive oil, lemon juice, garlic, and fresh herbs. Meal planning  Plan to eat 1 vegetarian meal one day each week. Try to work up to 2 vegetarian meals, if possible.  Eat seafood 2 or more times a week.  Have healthy snacks readily available, such as: ? Vegetable sticks with hummus. ? [place] yogurt. ? Fruit and nut trail mix.  Eat balanced meals throughout the week. This includes: ? Fruit: 2-3 servings a day ? Vegetables: 4-5 servings a day ? Low-fat dairy: 2 servings a day ? Fish, poultry, or lean meat: 1 serving a day ? Beans and legumes: 2 or more servings a week ? Nuts and seeds: 1-2 servings a day ?  Whole grains: 6-8 servings a day ? Extra-virgin olive oil: 3-4 servings a day  Limit red meat and sweets to only a few servings a month What are my food choices?  Mediterranean diet ? Recommended ? Grains: Whole-grain pasta. Brown rice. Bulgar wheat. Polenta. Couscous. Whole-wheat bread. [place]. ? Vegetables: Artichokes. Beets. Broccoli. Cabbage. Carrots. Eggplant. Green beans. Chard. Kale. Spinach. Onions. Leeks. Peas. Squash. Tomatoes. Peppers. Radishes. ? Fruits: Apples. Apricots. Avocado. Berries. Bananas. Cherries. Dates. Figs. Grapes.  Lemons. Melon. Oranges. Peaches. Plums. Pomegranate. ? Meats and other protein foods: Beans. Almonds. Sunflower seeds. Pine nuts. Peanuts. [place]. Salmon. Scallops. Shrimp. [place]. Tilapia. Clams. Oysters. Eggs. ? Dairy: Low-fat milk. Cheese. Greek yogurt. ? Beverages: Water. Red wine. Herbal tea. ? Fats and oils: Extra virgin olive oil. Avocado oil. Grape seed oil. ? Sweets and desserts: [place] yogurt with honey. Baked apples. Poached pears. Trail mix. ? Seasoning and other foods: Basil. Cilantro. Coriander. Cumin. Mint. Parsley. Sage. Rosemary. Tarragon. Garlic. Oregano. Thyme. Pepper. Balsalmic vinegar. Tahini. Hummus. Tomato sauce. Olives. Mushrooms. ? Limit these ? Grains: Prepackaged pasta or rice dishes. Prepackaged cereal with added sugar. ? Vegetables: Deep fried potatoes (french fries). ? Fruits: Fruit canned in syrup. ? Meats and other protein foods: Beef. Pork. Lamb. Poultry with skin. Hot dogs. Berniece Salines. ? Dairy: Ice cream. Sour cream. Whole milk. ? Beverages: Juice. Sugar-sweetened soft drinks. Beer. Liquor and spirits. ? Fats and oils: Butter. Canola oil. Vegetable oil. Beef fat (tallow). Lard. ? Sweets and desserts: Cookies. Cakes. Pies. Candy. ? Seasoning and other foods: Mayonnaise. Premade sauces and marinades. ? The items listed may not be a complete list. Talk with your dietitian about what dietary choices are right for you. Summary  The Mediterranean diet includes both food and lifestyle choices.  Eat a variety of fresh fruits and vegetables, beans, nuts, seeds, and whole grains.  Limit the amount of red meat and sweets that you eat.  Talk with your health care provider about whether it is safe for you to drink red wine in moderation. This means 1 glass a day for nonpregnant women and 2 glasses a day for men. A glass of wine equals 5 oz (150 mL). This information is not intended to replace advice given to you by your health care provider. Make sure you discuss any questions  you have with your health care provider. Document Released: 02/04/2016 Document Revised: 03/08/2016 Document Reviewed: 02/04/2016 Elsevier Interactive Patient Education  2018 [place]. Troyce Gieske M.D.

## 2017-10-31 ENCOUNTER — Encounter: Payer: Self-pay | Admitting: Internal Medicine

## 2017-10-31 ENCOUNTER — Ambulatory Visit (INDEPENDENT_AMBULATORY_CARE_PROVIDER_SITE_OTHER): Payer: Medicare Other | Admitting: Internal Medicine

## 2017-10-31 VITALS — BP 122/60 | HR 67 | Temp 97.8°F | Wt 165.9 lb

## 2017-10-31 DIAGNOSIS — E039 Hypothyroidism, unspecified: Secondary | ICD-10-CM

## 2017-10-31 DIAGNOSIS — M810 Age-related osteoporosis without current pathological fracture: Secondary | ICD-10-CM | POA: Diagnosis not present

## 2017-10-31 DIAGNOSIS — Z79899 Other long term (current) drug therapy: Secondary | ICD-10-CM | POA: Diagnosis not present

## 2017-10-31 DIAGNOSIS — Z13220 Encounter for screening for lipoid disorders: Secondary | ICD-10-CM | POA: Diagnosis not present

## 2017-10-31 LAB — LIPID PANEL
CHOLESTEROL: 222 mg/dL — AB (ref 0–200)
HDL: 60 mg/dL (ref >39.00)
LDL Cholesterol: 145 mg/dL — ABNORMAL HIGH (ref 0–99)
NonHDL: 161.5
Total CHOL/HDL Ratio: 4
Triglycerides: 82 mg/dL (ref 0.0–149.0)
VLDL: 16.4 mg/dL (ref 0.0–40.0)

## 2017-10-31 LAB — HEPATIC FUNCTION PANEL
ALK PHOS: 32 U/L — AB (ref 39–117)
ALT: 18 U/L (ref 0–35)
AST: 30 U/L (ref 0–37)
Albumin: 4.3 g/dL (ref 3.5–5.2)
Bilirubin, Direct: 0.2 mg/dL (ref 0.0–0.3)
Total Bilirubin: 1 mg/dL (ref 0.2–1.2)
Total Protein: 6.7 g/dL (ref 6.0–8.3)

## 2017-10-31 NOTE — Patient Instructions (Signed)
Mediterranean. Eating.  Is the best  .   For heart health .        Mediterranean Diet A Mediterranean diet refers to food and lifestyle choices that are based on the traditions of countries located on the [employer]. This way of eating has been shown to help prevent certain conditions and improve outcomes for people who have chronic diseases, like kidney disease and heart disease. What are tips for following this plan? Lifestyle  Cook and eat meals together with your family, when possible.  Drink enough fluid to keep your urine clear or pale yellow.  Be physically active every day. This includes: ? Aerobic exercise like running or swimming. ? Leisure activities like gardening, walking, or housework.  Get 7-8 hours of sleep each night.  If recommended by your health care provider, drink red wine in moderation. This means 1 glass a day for nonpregnant women and 2 glasses a day for men. A glass of wine equals 5 oz (150 mL). Reading food labels  Check the serving size of packaged foods. For foods such as rice and pasta, the serving size refers to the amount of cooked product, not dry.  Check the total fat in packaged foods. Avoid foods that have saturated fat or trans fats.  Check the ingredients list for added sugars, such as corn syrup. Shopping  At the grocery store, buy most of your food from the areas near the walls of the store. This includes: ? Fresh fruits and vegetables (produce). ? Grains, beans, nuts, and seeds. Some of these may be available in unpackaged forms or large amounts (in bulk). ? Fresh seafood. ? Poultry and eggs. ? Low-fat dairy products.  Buy whole ingredients instead of prepackaged foods.  Buy fresh fruits and vegetables in-season from local farmers markets.  Buy frozen fruits and vegetables in resealable bags.  If you do not have access to quality fresh seafood, buy precooked frozen shrimp or canned fish, such as tuna, salmon, or  sardines.  Buy small amounts of raw or cooked vegetables, salads, or olives from the deli or salad bar at your store.  Stock your pantry so you always have certain foods on hand, such as olive oil, canned tuna, canned tomatoes, rice, pasta, and beans. Cooking  Cook foods with extra-virgin olive oil instead of using butter or other vegetable oils.  Have meat as a side dish, and have vegetables or grains as your main dish. This means having meat in small portions or adding small amounts of meat to foods like pasta or stew.  Use beans or vegetables instead of meat in common dishes like chili or lasagna.  Experiment with different cooking methods. Try roasting or broiling vegetables instead of steaming or sauteing them.  Add frozen vegetables to soups, stews, pasta, or rice.  Add nuts or seeds for added healthy fat at each meal. You can add these to yogurt, salads, or vegetable dishes.  Marinate fish or vegetables using olive oil, lemon juice, garlic, and fresh herbs. Meal planning  Plan to eat 1 vegetarian meal one day each week. Try to work up to 2 vegetarian meals, if possible.  Eat seafood 2 or more times a week.  Have healthy snacks readily available, such as: ? Vegetable sticks with hummus. ? [place] yogurt. ? Fruit and nut trail mix.  Eat balanced meals throughout the week. This includes: ? Fruit: 2-3 servings a day ? Vegetables: 4-5 servings a day ? Low-fat dairy: 2 servings a day ?  Fish, poultry, or lean meat: 1 serving a day ? Beans and legumes: 2 or more servings a week ? Nuts and seeds: 1-2 servings a day ? Whole grains: 6-8 servings a day ? Extra-virgin olive oil: 3-4 servings a day  Limit red meat and sweets to only a few servings a month What are my food choices?  Mediterranean diet ? Recommended ? Grains: Whole-grain pasta. Brown rice. Bulgar wheat. Polenta. Couscous. Whole-wheat bread. [place]. ? Vegetables: Artichokes. Beets. Broccoli. Cabbage.  Carrots. Eggplant. Green beans. Chard. Kale. Spinach. Onions. Leeks. Peas. Squash. Tomatoes. Peppers. Radishes. ? Fruits: Apples. Apricots. Avocado. Berries. Bananas. Cherries. Dates. Figs. Grapes. Lemons. Melon. Oranges. Peaches. Plums. Pomegranate. ? Meats and other protein foods: Beans. Almonds. Sunflower seeds. Pine nuts. Peanuts. [place]. Salmon. Scallops. Shrimp. [place]. Tilapia. Clams. Oysters. Eggs. ? Dairy: Low-fat milk. Cheese. Greek yogurt. ? Beverages: Water. Red wine. Herbal tea. ? Fats and oils: Extra virgin olive oil. Avocado oil. Grape seed oil. ? Sweets and desserts: [place] yogurt with honey. Baked apples. Poached pears. Trail mix. ? Seasoning and other foods: Basil. Cilantro. Coriander. Cumin. Mint. Parsley. Sage. Rosemary. Tarragon. Garlic. Oregano. Thyme. Pepper. Balsalmic vinegar. Tahini. Hummus. Tomato sauce. Olives. Mushrooms. ? Limit these ? Grains: Prepackaged pasta or rice dishes. Prepackaged cereal with added sugar. ? Vegetables: Deep fried potatoes (french fries). ? Fruits: Fruit canned in syrup. ? Meats and other protein foods: Beef. Pork. Lamb. Poultry with skin. Hot dogs. Berniece Salines. ? Dairy: Ice cream. Sour cream. Whole milk. ? Beverages: Juice. Sugar-sweetened soft drinks. Beer. Liquor and spirits. ? Fats and oils: Butter. Canola oil. Vegetable oil. Beef fat (tallow). Lard. ? Sweets and desserts: Cookies. Cakes. Pies. Candy. ? Seasoning and other foods: Mayonnaise. Premade sauces and marinades. ? The items listed may not be a complete list. Talk with your dietitian about what dietary choices are right for you. Summary  The Mediterranean diet includes both food and lifestyle choices.  Eat a variety of fresh fruits and vegetables, beans, nuts, seeds, and whole grains.  Limit the amount of red meat and sweets that you eat.  Talk with your health care provider about whether it is safe for you to drink red wine in moderation. This means 1 glass a day for nonpregnant women  and 2 glasses a day for men. A glass of wine equals 5 oz (150 mL). This information is not intended to replace advice given to you by your health care provider. Make sure you discuss any questions you have with your health care provider. Document Released: 02/04/2016 Document Revised: 03/08/2016 Document Reviewed: 02/04/2016 Elsevier Interactive Patient Education  [employer].

## 2018-02-01 ENCOUNTER — Telehealth: Payer: Self-pay | Admitting: *Deleted

## 2018-02-01 NOTE — Telephone Encounter (Addendum)
Deductible $185($135mt)  OOP MAX Amount met  Annual exam upcoming 02/19/18  Calcium 9.5     Date 03/31/17  Upcoming dental procedures   Prior Authorization needed NO  Pt estimated Cost $0  Appt 02/28/18 at 10:30       Coverage Details: This is a [employer] Plan and it covers the medicare Part B deductible and 100% of the excess charges

## 2018-02-18 ENCOUNTER — Other Ambulatory Visit: Payer: Self-pay | Admitting: Internal Medicine

## 2018-02-19 ENCOUNTER — Encounter: Payer: Self-pay | Admitting: Obstetrics & Gynecology

## 2018-02-19 ENCOUNTER — Ambulatory Visit (INDEPENDENT_AMBULATORY_CARE_PROVIDER_SITE_OTHER): Payer: Medicare Other | Admitting: Obstetrics & Gynecology

## 2018-02-19 VITALS — BP 124/86 | Ht 60.0 in | Wt 171.0 lb

## 2018-02-19 DIAGNOSIS — Z90711 Acquired absence of uterus with remaining cervical stump: Secondary | ICD-10-CM

## 2018-02-19 DIAGNOSIS — Z01419 Encounter for gynecological examination (general) (routine) without abnormal findings: Secondary | ICD-10-CM | POA: Diagnosis not present

## 2018-02-19 DIAGNOSIS — M810 Age-related osteoporosis without current pathological fracture: Secondary | ICD-10-CM

## 2018-02-19 NOTE — Progress Notes (Signed)
[name] [name] 09/10/1940 [id number]   History:    77 y.o. G1P1L1 Married  RP:  Established patient presenting for annual gyn exam   HPI: H/O Supracervical Hysterectomy/BSO with Sacrocolpopexy and Sling Procedure in 07/2007.  Menopause, well on no HRT x last year.  No pelvic pain.  Rarely sexually active.  No stress urinary incontinence.  Bowel movements normal.  Breasts normal.  BMI 33.40.  Very active, gym with personal trainer.  On Prolia for osteoporosis.  Health labs with family physician.  Past medical history,surgical history, family history and social history were all reviewed and documented in the EPIC chart.  Gynecologic History No LMP recorded. Patient has had a hysterectomy. Contraception: status post hysterectomy Last Pap: 12/2016. Results were: Negative Last mammogram: 07/2016. Results were: Negative.  Per patient, had screening mammo in 2019, will obtain report from [facility] Bone Density: 03/2016 Rt Femoral neck T-Score -2.8.  On Prolia.  Will repeat BD in 03/2018 Colonoscopy: [facility] 2017  Obstetric History OB History  Gravida Para Term Preterm AB Living  1 1       1   SAB TAB Ectopic Multiple Live Births               # Outcome Date GA Lbr Len/2nd Weight Sex Delivery Anes PTL Lv  1 Para              ROS: A ROS was performed and pertinent positives and negatives are included in the history.  GENERAL: No fevers or chills. HEENT: No change in vision, no earache, sore throat or sinus congestion. NECK: No pain or stiffness. CARDIOVASCULAR: No chest pain or pressure. No palpitations. PULMONARY: No shortness of breath, cough or wheeze. GASTROINTESTINAL: No abdominal pain, nausea, vomiting or diarrhea, melena or bright red blood per rectum. GENITOURINARY: No urinary frequency, urgency, hesitancy or dysuria. MUSCULOSKELETAL: No joint or muscle pain, no back pain, no recent trauma. DERMATOLOGIC: No rash, no itching, no lesions. ENDOCRINE: No polyuria, polydipsia, no heat or cold  intolerance. No recent change in weight. HEMATOLOGICAL: No anemia or easy bruising or bleeding. NEUROLOGIC: No headache, seizures, numbness, tingling or weakness. PSYCHIATRIC: No depression, no loss of interest in normal activity or change in sleep pattern.     Exam:   BP 124/86   Ht 5' (1.524 m)   Wt 171 lb (77.6 kg)   BMI 33.40 kg/m   Body mass index is 33.4 kg/m.  General appearance : Well developed well nourished female. No acute distress HEENT: Eyes: no retinal hemorrhage or exudates,  Neck supple, trachea midline, no carotid bruits, no thyroidmegaly Lungs: Clear to auscultation, no rhonchi or wheezes, or rib retractions  Heart: Regular rate and rhythm, no murmurs or gallops Breast:Examined in sitting and supine position were symmetrical in appearance, no palpable masses or tenderness,  no skin retraction, no nipple inversion, no nipple discharge, no skin discoloration, no axillary or supraclavicular lymphadenopathy Abdomen: no palpable masses or tenderness, no rebound or guarding Extremities: no edema or skin discoloration or tenderness  Pelvic: Vulva: Normal             Vagina: No gross lesions or discharge  Cervix: No gross lesions or discharge             Uterus absent  Adnexa  Without masses or tenderness  Anus: Normal   Assessment/Plan:  77 y.o. female for annual exam   1. Well female exam with routine gynecological exam Gynecologic exam status post supra cervical hysterectomy and BSO with  sacrocolpopexy and sling procedure in 2009.  Pap test July 2018 was negative, no indication to repeat.  Breast exam normal.  Will obtain the report of her last screening mammogram from [facility]  Health labs with family physician.  2. Status post laparoscopic supracervical hysterectomy Supracervical Hysterectomy/BSO, Sacrocolpopexy, Sling procedure in 07/2007.  3. Age-related osteoporosis without current pathological fracture Osteoporosis on last bone density in October 2017 with the  lowest T score at the left femoral neck at -2.8.  Continue with Prolia treatment.  Calcium intake of 1.5 g/day including nutritional and supplemental, vitamin D supplements, regular weightbearing physical activities.  Repeat BD at [facility] 03/2018.   Princess Bruins MD, 10:50 AM 02/19/2018

## 2018-02-19 NOTE — Patient Instructions (Signed)
  1. Well female exam with routine gynecological exam Gynecologic exam status post supra cervical hysterectomy and BSO with sacrocolpopexy and sling procedure in 2009.  Pap test July 2018 was negative, no indication to repeat.  Breast exam normal.  Will obtain the report of her last screening mammogram from [facility].  Health labs with family physician.  2. Status post laparoscopic supracervical hysterectomy Supracervical Hysterectomy/BSO, Sacrocolpopexy, Sling procedure in 07/2007.  3. Age-related osteoporosis without current pathological fracture Osteoporosis on last bone density in October 2017 with the lowest T score at the left femoral neck at -2.8.  Continue with Prolia treatment.  Calcium intake of 1.5 g/day including nutritional and supplemental, vitamin D supplements, regular weightbearing physical activities.  Repeat BD at [facility] 03/2018.  [name] [name], it is such a pleasure seeing you!

## 2018-02-27 ENCOUNTER — Ambulatory Visit: Payer: Medicare Other

## 2018-02-28 ENCOUNTER — Ambulatory Visit: Payer: Medicare Other

## 2018-03-01 NOTE — Telephone Encounter (Signed)
prolia appt R/S to 03/02/18 at 10:30

## 2018-03-02 ENCOUNTER — Ambulatory Visit (INDEPENDENT_AMBULATORY_CARE_PROVIDER_SITE_OTHER): Payer: Medicare Other | Admitting: Anesthesiology

## 2018-03-02 DIAGNOSIS — M810 Age-related osteoporosis without current pathological fracture: Secondary | ICD-10-CM | POA: Diagnosis not present

## 2018-03-02 MED ORDER — DENOSUMAB 60 MG/ML ~~LOC~~ SOSY
60.0000 mg | PREFILLED_SYRINGE | Freq: Once | SUBCUTANEOUS | Status: AC
  Administered 2018-03-02: 60 mg via SUBCUTANEOUS

## 2018-03-07 DIAGNOSIS — M5136 Other intervertebral disc degeneration, lumbar region: Secondary | ICD-10-CM | POA: Diagnosis not present

## 2018-03-14 ENCOUNTER — Encounter: Payer: Self-pay | Admitting: Anesthesiology

## 2018-03-15 NOTE — Telephone Encounter (Signed)
PROLIA GIVEN 03/02/18 NEXT INJECTION 09/01/2018

## 2018-03-19 DIAGNOSIS — M533 Sacrococcygeal disorders, not elsewhere classified: Secondary | ICD-10-CM | POA: Diagnosis not present

## 2018-03-26 ENCOUNTER — Other Ambulatory Visit: Payer: Self-pay

## 2018-03-26 DIAGNOSIS — M4316 Spondylolisthesis, lumbar region: Secondary | ICD-10-CM | POA: Diagnosis not present

## 2018-03-26 DIAGNOSIS — R531 Weakness: Secondary | ICD-10-CM | POA: Diagnosis not present

## 2018-03-26 DIAGNOSIS — M4726 Other spondylosis with radiculopathy, lumbar region: Secondary | ICD-10-CM | POA: Diagnosis not present

## 2018-03-26 DIAGNOSIS — M533 Sacrococcygeal disorders, not elsewhere classified: Secondary | ICD-10-CM | POA: Diagnosis not present

## 2018-03-26 DIAGNOSIS — M25551 Pain in right hip: Secondary | ICD-10-CM | POA: Diagnosis not present

## 2018-03-26 DIAGNOSIS — S32010A Wedge compression fracture of first lumbar vertebra, initial encounter for closed fracture: Secondary | ICD-10-CM | POA: Diagnosis not present

## 2018-03-26 DIAGNOSIS — M5416 Radiculopathy, lumbar region: Secondary | ICD-10-CM | POA: Diagnosis not present

## 2018-03-26 MED ORDER — PANTOPRAZOLE SODIUM 20 MG PO TBEC: 20.0000 mg | DELAYED_RELEASE_TABLET | Freq: Every day | ORAL | 0 refills | Status: DC

## 2018-03-26 NOTE — Telephone Encounter (Signed)
Pantoprazole refilled as requested by pharmacy. 

## 2018-03-27 ENCOUNTER — Telehealth: Payer: Self-pay | Admitting: Internal Medicine

## 2018-03-27 NOTE — Telephone Encounter (Signed)
Copied from [place] [id number]. Topic: Inquiry >> Mar 27, 2018  3:08 PM Selinda Flavin B, NT wrote: Reason for CRM: Patient would like to know if she needs the pneumonia injection? States that if she needs it, she would like to received that on 04/06/18 when she gets her flu shot. Please advise.

## 2018-03-28 NOTE — Telephone Encounter (Signed)
Patient is up to date on pneumococcal vaccination. Patient notified and verbalized understanding.

## 2018-03-29 DIAGNOSIS — M545 Low back pain: Secondary | ICD-10-CM | POA: Diagnosis not present

## 2018-04-03 DIAGNOSIS — Z23 Encounter for immunization: Secondary | ICD-10-CM | POA: Diagnosis not present

## 2018-04-04 DIAGNOSIS — M533 Sacrococcygeal disorders, not elsewhere classified: Secondary | ICD-10-CM | POA: Diagnosis not present

## 2018-04-04 DIAGNOSIS — M4726 Other spondylosis with radiculopathy, lumbar region: Secondary | ICD-10-CM | POA: Diagnosis not present

## 2018-04-04 DIAGNOSIS — M47816 Spondylosis without myelopathy or radiculopathy, lumbar region: Secondary | ICD-10-CM | POA: Diagnosis not present

## 2018-04-04 DIAGNOSIS — S32010A Wedge compression fracture of first lumbar vertebra, initial encounter for closed fracture: Secondary | ICD-10-CM | POA: Diagnosis not present

## 2018-04-04 DIAGNOSIS — M438X6 Other specified deforming dorsopathies, lumbar region: Secondary | ICD-10-CM | POA: Diagnosis not present

## 2018-04-04 DIAGNOSIS — M48061 Spinal stenosis, lumbar region without neurogenic claudication: Secondary | ICD-10-CM | POA: Diagnosis not present

## 2018-04-04 DIAGNOSIS — M4316 Spondylolisthesis, lumbar region: Secondary | ICD-10-CM | POA: Diagnosis not present

## 2018-04-06 ENCOUNTER — Ambulatory Visit: Payer: Medicare Other

## 2018-04-11 DIAGNOSIS — M545 Low back pain: Secondary | ICD-10-CM | POA: Diagnosis not present

## 2018-04-17 DIAGNOSIS — M810 Age-related osteoporosis without current pathological fracture: Secondary | ICD-10-CM | POA: Diagnosis not present

## 2018-04-19 DIAGNOSIS — M545 Low back pain: Secondary | ICD-10-CM | POA: Diagnosis not present

## 2018-04-20 ENCOUNTER — Encounter: Payer: Self-pay | Admitting: Anesthesiology

## 2018-04-23 DIAGNOSIS — S32010A Wedge compression fracture of first lumbar vertebra, initial encounter for closed fracture: Secondary | ICD-10-CM | POA: Diagnosis not present

## 2018-04-23 DIAGNOSIS — M5416 Radiculopathy, lumbar region: Secondary | ICD-10-CM | POA: Diagnosis not present

## 2018-04-23 DIAGNOSIS — M533 Sacrococcygeal disorders, not elsewhere classified: Secondary | ICD-10-CM | POA: Diagnosis not present

## 2018-04-23 DIAGNOSIS — S32010D Wedge compression fracture of first lumbar vertebra, subsequent encounter for fracture with routine healing: Secondary | ICD-10-CM | POA: Diagnosis not present

## 2018-04-30 ENCOUNTER — Encounter: Payer: Medicare Other | Admitting: Internal Medicine

## 2018-05-10 NOTE — Progress Notes (Signed)
Chief Complaint  Patient presents with  . Annual Exam  . Follow-up  . Medication Management  . Hypothyroidism  . Osteoporosis    HPI: [name] [name] 77 y.o. comes in today for yearly visit  Med checks    Had mri at [facility]     Right hip and leg   Taking gaba  At needed then picked up  Iron bed frame  Last satruday. And had ortho at [facility].  Does PT there and .   Now on gabapentin   protonix   Most days help.  Last dexa 2019  Vit d  1000 iu   Thyroid: takes daily with meds in am   Health Maintenance  Topic Date Due  . INFLUENZA VACCINE  01/25/2018  . TETANUS/TDAP  11/10/2025  . DEXA SCAN  Completed  . PNA vac Low Risk Adult  Completed   Health Maintenance Review LIFESTYLE:  Exercise:   Goof considering     Tobacco/ETS:  no Alcohol:   Glass ocass  Sugar beverages: what is that  Sleep: 6 hours  Drug use: no HH:  2    4 dog   Rescue  Medical needs  Is utd for colon cancer cologuard   Hearing:  ok  Vision:  No limitations at present . Last eye check UTD reading glasses   Safety:  Has smoke detector and wears seat belts.  No firearms. No excess sun exposure. Sees dentist regularly.  Nutrition: Eats well balanced diet; adequate calcium and vitamin D. No swallowing chewing problems.  Injury: no major injuries in the last si  ADLS:   There are no problems or need for assistance  driving, feeding, obtaining food, dressing, toileting and bathing, managing money using phone. She is independent.     ROS:  GEN/ HEENT: No fever, significant weight changes sweats headaches vision problems hearing changes, CV/ PULM; No chest pain shortness of breath cough, syncope,edema  change in exercise tolerance. GI /GU: No adominal pain, vomiting, change in bowel habits. No blood in the stool. No significant GU symptoms. SKIN/HEME: ,no acute skin rashes suspicious lesions or bleeding. No lymphadenopathy, nodules, masses.  NEURO/ PSYCH:  No neurologic signs such as weakness numbness. No  depression anxiety. IMM/ Allergy: No unusual infections.  Allergy .   REST of 12 system review negative except as per HPI   Past Medical History:  Diagnosis Date  . Dog bite of thigh 04/09/2012  . Elevated lipids   . GERD (gastroesophageal reflux disease)   . Hypothyroidism    post thyroidectomy  . Osteopenia    t -2.5 hip in 2005 by Dexa  . Osteoporosis    commpr fx after dog related fall  injury  . Seasonal rhinitis    spring and fall  . Skin cancer    squamous cell ca chest  . Vaginal prolapse    hs corrected    Family History  Problem Relation Age of Onset  . Heart attack Mother   . Alcohol abuse Mother   . Kidney cancer Father   . Hypertension Maternal Grandmother   . Esophageal cancer Neg Hx   . Colon cancer Neg Hx   . Pancreatic cancer Neg Hx   . Stomach cancer Neg Hx     Social History   Socioeconomic History  . Marital status: Married    Spouse name: Not on file  . Number of children: Not on file  . Years of education: Not on file  . Highest education level:  Not on file  Occupational History  . Not on file  Social Needs  . Financial resource strain: Not on file  . Food insecurity:    Worry: Not on file    Inability: Not on file  . Transportation needs:    Medical: Not on file    Non-medical: Not on file  Tobacco Use  . Smoking status: Never Smoker  . Smokeless tobacco: Never Used  Substance and Sexual Activity  . Alcohol use: Yes    Alcohol/week: 1.0 standard drinks    Types: 1 Glasses of wine per week    Comment: occasionally  . Drug use: No  . Sexual activity: Not Currently    Comment: 1st itnercourse- 19, partners- 2, married- 50 yrs   Lifestyle  . Physical activity:    Days per week: Not on file    Minutes per session: Not on file  . Stress: Not on file  Relationships  . Social connections:    Talks on phone: Not on file    Gets together: Not on file    Attends religious service: Not on file    Active member of club or  organization: Not on file    Attends meetings of clubs or organizations: Not on file    Relationship status: Not on file  Other Topics Concern  . 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 b complex vitamins tablet Take 1 tablet by mouth daily.  Marland Kitchen CALCIUM PO Take 1,200 mg by mouth daily.   . cholecalciferol (VITAMIN D) 1000 UNITS tablet Take 1,000 Units by mouth every morning.   . denosumab (PROLIA) 60 MG/ML SOLN injection Inject 60 mg into the skin every 6 (six) months. Administer in upper arm, thigh, or abdomen  . gabapentin (NEURONTIN) 100 MG capsule Take 100 mg by mouth as needed.  . pantoprazole (PROTONIX) 20 MG tablet Take 1 tablet (20 mg total) by mouth daily.  Marland Kitchen SYNTHROID 88 MCG tablet TAKE 1 TABLET(88 MCG) BY MOUTH DAILY BEFORE BREAKFAST   No facility-administered encounter medications on file as of 05/11/2018.     EXAM:  BP 118/66 (BP Location: Left Arm, Patient Position: Sitting, Cuff Size: Normal)   Pulse 76   Temp 98.2 F (36.8 C) (Oral)   Wt 164 lb 9.6 oz (74.7 kg)   BMI 32.15 kg/m   Body mass index is 32.15 kg/m.  Physical Exam: Vital signs reviewed DPO:EUMP is a well-developed well-nourished alert cooperative   who appears stated age in no acute distress.  HEENT: normocephalic atraumatic , Eyes: PERRL EOM's full, conjunctiva clear, Nares: paten,t no deformity discharge or tenderness., Ears: no deformity EAC's clear TMs with normal landmarks. Mouth: clear OP, no lesions, edema.  Moist mucous membranes. Dentition in adequate repair. NECK: supple without masses, thyromegaly or bruits. CHEST/PULM:  Clear to auscultation and percussion breath sounds equal no wheeze , rales or rhonchi. Mod kyohosis   CV: PMI is nondisplaced, S1 S2 no gallops, murmurs, rubs. Peripheral  pulses are full without delay.No JVD .  ABDOMEN: Bowel sounds normal nontender  No guard or rebound, no hepato splenomegal no CVA tenderness.   Extremtities:  No clubbing cyanosis or edema, no acute joint  swelling or redness no focal atrophy some djd  NEURO:  Oriented x3, cranial nerves 3-12 appear to be intact, no obvious focal weakness,gait within normal limits no abnormal reflexes or asymmetrical SKIN: No acute rashes normal turgor, color, no bruising or petechiae. PSYCH: Oriented, good eye contact, no obvious depression anxiety, cognition and judgment appear normal. LN: no cervical axillary  adenopathy No noted deficits in memory, attention, and speech.   Lab Results  Component Value Date   WBC 5.8 05/11/2018   HGB 12.8 05/11/2018   HCT 38.0 05/11/2018   PLT 282.0 05/11/2018   GLUCOSE 89 05/11/2018   CHOL 196 05/11/2018   TRIG 93.0 05/11/2018   HDL 56.40 05/11/2018   LDLDIRECT 134.1 12/27/2006   LDLCALC 121 (H) 05/11/2018   ALT 14 05/11/2018   AST 21 05/11/2018   NA 143 05/11/2018   K 4.0 05/11/2018   CL 107 05/11/2018   CREATININE 0.81 05/11/2018   BUN 19 05/11/2018   CO2 29 05/11/2018   TSH 4.57 (H) 05/11/2018   INR 1.0 07/25/2007    ASSESSMENT AND PLAN:  Discussed the following assessment and plan:  Hypothyroidism, unspecified type - Plan: Basic metabolic panel, CBC with Differential/Platelet, Hepatic function panel, Lipid panel, TSH  Medication management - Plan: Basic metabolic panel, CBC with Differential/Platelet, Hepatic function panel, Lipid panel, TSH  Age-related osteoporosis without current pathological fracture - Plan: Basic metabolic panel, CBC with Differential/Platelet, Hepatic function panel, Lipid panel, TSH  Hyperlipidemia, unspecified hyperlipidemia type - Plan: Basic metabolic panel, CBC with Differential/Platelet, Hepatic function panel, Lipid panel, TSH Counseled. Take medication   Before all other meds and food etc   .    Patient Care  Team: Rebie Peale, Standley Brooking, MD as PCP - General Princess Bruins, MD as Attending Physician (Obstetrics and Gynecology) Addison Lank, MD as Referring Physician (Dermatology) [facility]eman [facility]x   Patient Instructions  Take the synthroid  30 - 60 minutes before any other medications or  Food  For more consistent absorption.  Will notify you  of labs when available.   Continue  Bone health  Measures.  Stay on prolia    Bone Health Bones protect organs, store calcium, and anchor muscles. Good health habits, such as eating nutritious foods and exercising regularly, are important for maintaining healthy bones. They can also help to prevent a condition that causes bones to lose density and become weak and brittle (osteoporosis). Why is bone mass important? Bone mass refers to the amount of bone tissue that you have. The higher your bone mass, the stronger your bones. An important step toward having healthy bones throughout life is to have strong and dense bones during childhood. A young adult who has a high bone mass is more likely to have a high bone mass later in life. Bone mass at its greatest it is called peak bone mass. A large decline in bone mass occurs in older adults. In women, it occurs about the time of menopause. During this time, it is important to practice good health habits, because if more bone is lost than what is replaced, the bones will become less healthy and more likely to break (fracture). If you find that you have a low bone mass, you may be able to prevent osteoporosis or further bone loss by changing your diet and lifestyle. How can I find out if my bone mass is low? Bone mass can be measured with an X-ray test that is called a bone mineral density (BMD) test. This test is  recommended for all women who are age 682 or older. It may also be recommended for men who are age 31 or older, or for people who are more likely to develop osteoporosis due to:  Having bones that break  easily.  Having a long-term disease that weakens bones, such as kidney disease or rheumatoid arthritis.  Having menopause earlier than normal.  Taking medicine that weakens bones, such as steroids, thyroid hormones, or hormone treatment for breast cancer or prostate cancer.  Smoking.  Drinking three or more alcoholic drinks each day.  What are the nutritional recommendations for healthy bones? To have healthy bones, you need to get enough of the right minerals and vitamins. Most nutrition experts recommend getting these nutrients from the foods that you eat. Nutritional recommendations vary from person to person. Ask your health care provider what is healthy for you. Here are some general guidelines. Calcium Recommendations Calcium is the most important (essential) mineral for bone health. Most people can get enough calcium from their diet, but supplements may be recommended for people who are at risk for osteoporosis. Good sources of calcium include:  Dairy products, such as low-fat or nonfat milk, cheese, and yogurt.  Dark green leafy vegetables, such as bok choy and broccoli.  Calcium-fortified foods, such as orange juice, cereal, bread, soy beverages, and tofu products.  Nuts, such as almonds.  Follow these recommended amounts for daily calcium intake:  Children, age 68?3: 700 mg.  Children, age 1?8: 1,000 mg.  Children, age 28?13: 1,300 mg.  Teens, age 33?18: 1,300 mg.  Adults, age 16?50: 1,000 mg.  Adults, age 685?70: ? Men: 1,000 mg. ? Women: 1,200 mg.  Adults, age 72 or older: 1,200 mg.  Pregnant and breastfeeding females: ? Teens: 1,300 mg. ? Adults: 1,000 mg.  Vitamin D Recommendations Vitamin D is the most essential vitamin for bone health. It helps the body to absorb calcium. Sunlight stimulates the skin to make vitamin D, so be sure to get enough sunlight. If you live in a cold climate or you do not get outside often, your health care provider may recommend  that you take vitamin D supplements. Good sources of vitamin D in your diet include:  Egg yolks.  Saltwater fish.  Milk and cereal fortified with vitamin D.  Follow these recommended amounts for daily vitamin D intake:  Children and teens, age 71?18: 27 international units.  Adults, age 15 or younger: 400-800 international units.  Adults, age 15 or older: 800-1,000 international units.  Other Nutrients Other nutrients for bone health include:  Phosphorus. This mineral is found in meat, poultry, dairy foods, nuts, and legumes. The recommended daily intake for adult men and adult women is 700 mg.  Magnesium. This mineral is found in seeds, nuts, dark green vegetables, and legumes. The recommended daily intake for adult men is 400?420 mg. For adult women, it is 310?320 mg.  Vitamin K. This vitamin is found in green leafy vegetables. The recommended daily intake is 120 mg for adult men and 90 mg for adult women.  What type of physical activity is best for building and maintaining healthy bones? Weight-bearing and strength-building activities are important for building and maintaining peak bone mass. Weight-bearing activities cause muscles and bones to work against gravity. Strength-building activities increases muscle strength that supports bones. Weight-bearing and muscle-building activities include:  Walking and hiking.  Jogging and running.  Dancing.  Gym exercises.  Lifting weights.  Tennis and racquetball.  Climbing stairs.  Aerobics.  Adults  should get at least 30 minutes of moderate physical activity on most days. Children should get at least 60 minutes of moderate physical activity on most days. Ask your health care provide what type of exercise is best for you. Where can I find more information? For more information, check out the following websites:  [place]: YardHomes.se  [employer] of Health:  http://www.niams.AnonymousEar.fr.asp  This information is not intended to replace advice given to you by your health care provider. Make sure you discuss any questions you have with your health care provider. Document Released: 09/03/2003 Document Revised: 01/01/2016 Document Reviewed: 06/18/2014 Elsevier Interactive Patient Education  2018 [place]. Clint Biello M.D.

## 2018-05-11 ENCOUNTER — Ambulatory Visit (INDEPENDENT_AMBULATORY_CARE_PROVIDER_SITE_OTHER): Payer: Medicare Other | Admitting: Internal Medicine

## 2018-05-11 ENCOUNTER — Encounter: Payer: Self-pay | Admitting: Internal Medicine

## 2018-05-11 VITALS — BP 118/66 | HR 76 | Temp 98.2°F | Wt 164.6 lb

## 2018-05-11 DIAGNOSIS — M810 Age-related osteoporosis without current pathological fracture: Secondary | ICD-10-CM | POA: Diagnosis not present

## 2018-05-11 DIAGNOSIS — Z79899 Other long term (current) drug therapy: Secondary | ICD-10-CM | POA: Diagnosis not present

## 2018-05-11 DIAGNOSIS — E785 Hyperlipidemia, unspecified: Secondary | ICD-10-CM | POA: Diagnosis not present

## 2018-05-11 DIAGNOSIS — E039 Hypothyroidism, unspecified: Secondary | ICD-10-CM

## 2018-05-11 LAB — CBC WITH DIFFERENTIAL/PLATELET
BASOS PCT: 0.2 % (ref 0.0–3.0)
Basophils Absolute: 0 10*3/uL (ref 0.0–0.1)
EOS ABS: 0.5 10*3/uL (ref 0.0–0.7)
Eosinophils Relative: 8.8 % — ABNORMAL HIGH (ref 0.0–5.0)
HEMATOCRIT: 38 % (ref 36.0–46.0)
HEMOGLOBIN: 12.8 g/dL (ref 12.0–15.0)
Lymphocytes Relative: 38.9 % (ref 12.0–46.0)
Lymphs Abs: 2.3 10*3/uL (ref 0.7–4.0)
MCHC: 33.6 g/dL (ref 30.0–36.0)
MCV: 96 fl (ref 78.0–100.0)
MONO ABS: 0.5 10*3/uL (ref 0.1–1.0)
Monocytes Relative: 7.9 % (ref 3.0–12.0)
Neutro Abs: 2.6 10*3/uL (ref 1.4–7.7)
Neutrophils Relative %: 44.2 % (ref 43.0–77.0)
Platelets: 282 10*3/uL (ref 150.0–400.0)
RBC: 3.95 Mil/uL (ref 3.87–5.11)
RDW: 14.3 % (ref 11.5–15.5)
WBC: 5.8 10*3/uL (ref 4.0–10.5)

## 2018-05-11 LAB — HEPATIC FUNCTION PANEL
ALT: 14 U/L (ref 0–35)
AST: 21 U/L (ref 0–37)
Albumin: 4.4 g/dL (ref 3.5–5.2)
Alkaline Phosphatase: 30 U/L — ABNORMAL LOW (ref 39–117)
BILIRUBIN DIRECT: 0.2 mg/dL (ref 0.0–0.3)
BILIRUBIN TOTAL: 1.1 mg/dL (ref 0.2–1.2)
TOTAL PROTEIN: 6.5 g/dL (ref 6.0–8.3)

## 2018-05-11 LAB — BASIC METABOLIC PANEL
BUN: 19 mg/dL (ref 6–23)
CO2: 29 mEq/L (ref 19–32)
Calcium: 9.7 mg/dL (ref 8.4–10.5)
Chloride: 107 mEq/L (ref 96–112)
Creatinine, Ser: 0.81 mg/dL (ref 0.40–1.20)
GFR: 72.92 mL/min (ref >60.00)
Glucose, Bld: 89 mg/dL (ref 70–99)
POTASSIUM: 4 meq/L (ref 3.5–5.1)
SODIUM: 143 meq/L (ref 135–145)

## 2018-05-11 LAB — TSH: TSH: 4.57 u[IU]/mL — ABNORMAL HIGH (ref 0.35–4.50)

## 2018-05-11 LAB — LIPID PANEL
CHOL/HDL RATIO: 3
Cholesterol: 196 mg/dL (ref 0–200)
HDL: 56.4 mg/dL (ref >39.00)
LDL CALC: 121 mg/dL — AB (ref 0–99)
NonHDL: 139.61
Triglycerides: 93 mg/dL (ref 0.0–149.0)
VLDL: 18.6 mg/dL (ref 0.0–40.0)

## 2018-05-11 NOTE — Patient Instructions (Addendum)
Take the synthroid  30 - 60 minutes before any other medications or  Food  For more consistent absorption.  Will notify you  of labs when available.   Continue  Bone health  Measures.  Stay on prolia    Bone Health Bones protect organs, store calcium, and anchor muscles. Good health habits, such as eating nutritious foods and exercising regularly, are important for maintaining healthy bones. They can also help to prevent a condition that causes bones to lose density and become weak and brittle (osteoporosis). Why is bone mass important? Bone mass refers to the amount of bone tissue that you have. The higher your bone mass, the stronger your bones. An important step toward having healthy bones throughout life is to have strong and dense bones during childhood. A young adult who has a high bone mass is more likely to have a high bone mass later in life. Bone mass at its greatest it is called peak bone mass. A large decline in bone mass occurs in older adults. In women, it occurs about the time of menopause. During this time, it is important to practice good health habits, because if more bone is lost than what is replaced, the bones will become less healthy and more likely to break (fracture). If you find that you have a low bone mass, you may be able to prevent osteoporosis or further bone loss by changing your diet and lifestyle. How can I find out if my bone mass is low? Bone mass can be measured with an X-ray test that is called a bone mineral density (BMD) test. This test is recommended for all women who are age 75 or older. It may also be recommended for men who are age 37 or older, or for people who are more likely to develop osteoporosis due to:  Having bones that break easily.  Having a long-term disease that weakens bones, such as kidney disease or rheumatoid arthritis.  Having menopause earlier than normal.  Taking medicine that weakens bones, such as steroids, thyroid hormones, or  hormone treatment for breast cancer or prostate cancer.  Smoking.  Drinking three or more alcoholic drinks each day.  What are the nutritional recommendations for healthy bones? To have healthy bones, you need to get enough of the right minerals and vitamins. Most nutrition experts recommend getting these nutrients from the foods that you eat. Nutritional recommendations vary from person to person. Ask your health care provider what is healthy for you. Here are some general guidelines. Calcium Recommendations Calcium is the most important (essential) mineral for bone health. Most people can get enough calcium from their diet, but supplements may be recommended for people who are at risk for osteoporosis. Good sources of calcium include:  Dairy products, such as low-fat or nonfat milk, cheese, and yogurt.  Dark green leafy vegetables, such as bok choy and broccoli.  Calcium-fortified foods, such as orange juice, cereal, bread, soy beverages, and tofu products.  Nuts, such as almonds.  Follow these recommended amounts for daily calcium intake:  Children, age 755?3: 700 mg.  Children, age 75?8: 1,000 mg.  Children, age 68?13: 1,300 mg.  Teens, age 22?18: 1,300 mg.  Adults, age 84?50: 1,000 mg.  Adults, age 27?70: ? Men: 1,000 mg. ? Women: 1,200 mg.  Adults, age 79 or older: 1,200 mg.  Pregnant and breastfeeding females: ? Teens: 1,300 mg. ? Adults: 1,000 mg.  Vitamin D Recommendations Vitamin D is the most essential vitamin for bone health. It helps  the body to absorb calcium. Sunlight stimulates the skin to make vitamin D, so be sure to get enough sunlight. If you live in a cold climate or you do not get outside often, your health care provider may recommend that you take vitamin D supplements. Good sources of vitamin D in your diet include:  Egg yolks.  Saltwater fish.  Milk and cereal fortified with vitamin D.  Follow these recommended amounts for daily vitamin D  intake:  Children and teens, age 49?18: 64 international units.  Adults, age 70 or younger: 400-800 international units.  Adults, age 47 or older: 800-1,000 international units.  Other Nutrients Other nutrients for bone health include:  Phosphorus. This mineral is found in meat, poultry, dairy foods, nuts, and legumes. The recommended daily intake for adult men and adult women is 700 mg.  Magnesium. This mineral is found in seeds, nuts, dark green vegetables, and legumes. The recommended daily intake for adult men is 400?420 mg. For adult women, it is 310?320 mg.  Vitamin K. This vitamin is found in green leafy vegetables. The recommended daily intake is 120 mg for adult men and 90 mg for adult women.  What type of physical activity is best for building and maintaining healthy bones? Weight-bearing and strength-building activities are important for building and maintaining peak bone mass. Weight-bearing activities cause muscles and bones to work against gravity. Strength-building activities increases muscle strength that supports bones. Weight-bearing and muscle-building activities include:  Walking and hiking.  Jogging and running.  Dancing.  Gym exercises.  Lifting weights.  Tennis and racquetball.  Climbing stairs.  Aerobics.  Adults should get at least 30 minutes of moderate physical activity on most days. Children should get at least 60 minutes of moderate physical activity on most days. Ask your health care provide what type of exercise is best for you. Where can I find more information? For more information, check out the following websites:  [place]: YardHomes.se  [employer] of Health: http://www.niams.AnonymousEar.fr.asp  This information is not intended to replace advice given to you by your health care provider. Make sure you discuss any questions you have with your health  care provider. Document Released: 09/03/2003 Document Revised: 01/01/2016 Document Reviewed: 06/18/2014 Elsevier Interactive Patient Education  [employer].

## 2018-05-14 ENCOUNTER — Other Ambulatory Visit: Payer: Self-pay | Admitting: Internal Medicine

## 2018-05-14 DIAGNOSIS — Z79899 Other long term (current) drug therapy: Secondary | ICD-10-CM

## 2018-05-14 DIAGNOSIS — E039 Hypothyroidism, unspecified: Secondary | ICD-10-CM

## 2018-05-16 ENCOUNTER — Telehealth: Payer: Self-pay

## 2018-05-16 NOTE — Telephone Encounter (Signed)
Called patient to review My Chart message below.  "Dr. Dellis Filbert reviewed your bone density test results and wrote "Bone Density result shows Osteoporosis. Recommend she schedule a visit with me to discuss management."     She had read it. I offered to have her speak to appointment desk to schedule appt and she agreed and scheduled.

## 2018-05-17 DIAGNOSIS — M533 Sacrococcygeal disorders, not elsewhere classified: Secondary | ICD-10-CM | POA: Diagnosis not present

## 2018-05-17 DIAGNOSIS — M5416 Radiculopathy, lumbar region: Secondary | ICD-10-CM | POA: Diagnosis not present

## 2018-05-22 DIAGNOSIS — M545 Low back pain: Secondary | ICD-10-CM | POA: Diagnosis not present

## 2018-05-29 ENCOUNTER — Other Ambulatory Visit: Payer: Self-pay | Admitting: Internal Medicine

## 2018-06-07 ENCOUNTER — Ambulatory Visit: Payer: Medicare Other | Admitting: Obstetrics & Gynecology

## 2018-06-13 DIAGNOSIS — M533 Sacrococcygeal disorders, not elsewhere classified: Secondary | ICD-10-CM | POA: Diagnosis not present

## 2018-07-05 ENCOUNTER — Other Ambulatory Visit: Payer: Self-pay | Admitting: Internal Medicine

## 2018-07-05 DIAGNOSIS — M5416 Radiculopathy, lumbar region: Secondary | ICD-10-CM | POA: Diagnosis not present

## 2018-07-05 DIAGNOSIS — M533 Sacrococcygeal disorders, not elsewhere classified: Secondary | ICD-10-CM | POA: Diagnosis not present

## 2018-07-12 ENCOUNTER — Ambulatory Visit (INDEPENDENT_AMBULATORY_CARE_PROVIDER_SITE_OTHER): Payer: Medicare Other | Admitting: Obstetrics & Gynecology

## 2018-07-12 ENCOUNTER — Encounter: Payer: Self-pay | Admitting: Obstetrics & Gynecology

## 2018-07-12 VITALS — BP 110/70 | Ht 61.4 in | Wt 162.0 lb

## 2018-07-12 DIAGNOSIS — M810 Age-related osteoporosis without current pathological fracture: Secondary | ICD-10-CM

## 2018-07-12 NOTE — Progress Notes (Signed)
    [name] [name] 09/03/1940 [id number]        78 y.o.  G1P1   RP: Osteoporosis on Prolia  HPI: Very active, enjoys walking.  Recently had pain with inflammation at the sacroiliac joint for which she received an injection of corticosteroid.  Improved now will get back to usual activities.  On Prolia for osteoporosis.  Taking vitamin D supplements, calcium intake of 1.2 to 1.5 g/day.   OB History  Gravida Para Term Preterm AB Living  1 1       1   SAB TAB Ectopic Multiple Live Births               # Outcome Date GA Lbr Len/2nd Weight Sex Delivery Anes PTL Lv  1 Para             Past medical history,surgical history, problem list, medications, allergies, family history and social history were all reviewed and documented in the EPIC chart.   Directed ROS with pertinent positives and negatives documented in the history of present illness/assessment and plan.  Exam:  Vitals:   07/12/18 1138  BP: 110/70  Weight: 162 lb (73.5 kg)  Height: 5' 1.4" (1.56 m)   General appearance:  Normal  Last BD [facility]:  T-Score -2.6 Stable compared with October 2017.   Assessment/Plan:  78 y.o. G1P1   1. Age-related osteoporosis without current pathological fracture Bone density stable compared to 2017 with a T score of -2.6 on Prolia.  Decision to continue on Prolia.  Prolia next dose 09/01/2018.  Vitamin D supplements, calcium intake of 1.5 g/day and regular weightbearing physical activities to continue.  We will repeat a bone density in October 2021.  Counseling on above issues and coordination of care more than 50% for 25 minutes.  Princess Bruins MD, 12:21 PM 07/12/2018

## 2018-07-16 ENCOUNTER — Encounter: Payer: Self-pay | Admitting: Obstetrics & Gynecology

## 2018-07-16 NOTE — Patient Instructions (Signed)
1. Age-related osteoporosis without current pathological fracture Bone density stable compared to 2017 with a T score of -2.6 on Prolia.  Decision to continue on Prolia.  Prolia next dose 09/01/2018.  Vitamin D supplements, calcium intake of 1.5 g/day and regular weightbearing physical activities to continue.  We will repeat a bone density in October 2021.  [name] [name], it was a pleasure seeing you today!

## 2018-07-28 ENCOUNTER — Other Ambulatory Visit: Payer: Self-pay | Admitting: Internal Medicine

## 2018-07-30 ENCOUNTER — Other Ambulatory Visit: Payer: Self-pay | Admitting: Internal Medicine

## 2018-08-03 ENCOUNTER — Emergency Department (INDEPENDENT_AMBULATORY_CARE_PROVIDER_SITE_OTHER)
Admission: EM | Admit: 2018-08-03 | Discharge: 2018-08-03 | Disposition: A | Discharge status: Home / Self Care | Payer: Medicare Other | Source: Home / Self Care | Attending: Emergency Medicine | Admitting: Emergency Medicine

## 2018-08-03 ENCOUNTER — Other Ambulatory Visit: Payer: Self-pay

## 2018-08-03 ENCOUNTER — Encounter: Payer: Self-pay | Admitting: Emergency Medicine

## 2018-08-03 DIAGNOSIS — J301 Allergic rhinitis due to pollen: Secondary | ICD-10-CM

## 2018-08-03 DIAGNOSIS — R350 Frequency of micturition: Secondary | ICD-10-CM | POA: Diagnosis not present

## 2018-08-03 DIAGNOSIS — J00 Acute nasopharyngitis [common cold]: Secondary | ICD-10-CM | POA: Diagnosis not present

## 2018-08-03 LAB — POCT URINALYSIS DIP (MANUAL ENTRY)
Bilirubin, UA: NEGATIVE
Blood, UA: NEGATIVE
Glucose, UA: NEGATIVE mg/dL
Ketones, POC UA: NEGATIVE mg/dL
Nitrite, UA: NEGATIVE
Protein Ur, POC: NEGATIVE mg/dL
Spec Grav, UA: 1.015 (ref 1.010–1.025)
Urobilinogen, UA: 0.2 E.U./dL
pH, UA: 5.5 (ref 5.0–8.0)

## 2018-08-03 MED ORDER — FLUTICASONE PROPIONATE 50 MCG/ACT NA SUSP: NASAL | 0 refills | Status: DC

## 2018-08-03 NOTE — Discharge Instructions (Addendum)
You likely have viral sinusitis/upper respiratory infection.  May be an element of allergic rhinitis.  No sign of bacterial infection, and no antibiotic indicated. Advised rest and plenty of fluids.  Please read instruction sheet over-the-counter Zyrtec once a day as needed for sneezing and drainage Flonase, 1 or 2 sprays each nostril twice a day I would expect you to get better within the next week, but if not, follow-up with your PCP. Also, your urine test today is within normal limits

## 2018-08-03 NOTE — ED Triage Notes (Signed)
Pt c/o sinus pain, nasal congestion, bilateral ear soreness, yellow/green nasal congestion x 1 wk. She also c/o urinary frequency x 3 days.

## 2018-08-03 NOTE — ED Provider Notes (Signed)
[name]    CSN: [id number] Arrival date & time: 08/03/18  1316 Patient presents to [facility].  (PCP is Dr Shanon Ace)    History   Chief Complaint Chief Complaint  Patient presents with  . Facial Pain  . Nasal Congestion  . Urinary Frequency    HPI [name] [name] is a 78 y.o. female.   HPI  Onset: 3-4 days Facial/sinus pressure with nasal congestion for 3 days, then 1 day of discolored nasal mucus.  Severity: moderate Has not tried OTC meds. She does have a history of allergic rhinitis in the past, and recently noted noted some sneezing and clear rhinorrhea and ears feel full at times.  Symptoms:  No definite fever.  She states temp at home 99.1 No minimal swollen neck glands + Minimal sinus Headache + mild ear pressure.  No drainage No significant Sore Throat No eye symptoms     No significant Cough No chest pain No shortness of breath  No wheezing  No Abdominal Pain No Nausea No Vomiting No diarrhea  No Myalgias No focal neurologic symptoms No syncope No Rash  Urinary symptoms: She mentions that she has some urinary frequency for a day but no other urinary symptoms.  No dysuria or pyuria or hematuria or back or flank or abdominal pain.  Remainder of Review of Systems negative except as noted in the HPI.  Reviewed the past medical social and family history below  Past Medical History:  Diagnosis Date  . Dog bite of thigh 04/09/2012  . Elevated lipids   . GERD (gastroesophageal reflux disease)   . Hypothyroidism    post thyroidectomy  . Osteopenia    t -2.5 hip in 2005 by Dexa  . Osteoporosis    commpr fx after dog related fall  injury  . Seasonal rhinitis    spring and fall  . Skin cancer    squamous cell ca chest  . Vaginal prolapse    hs corrected    Patient Active Problem List   Diagnosis Date Noted  . Medication management 10/16/2013  . Medicare annual wellness visit, subsequent 07/16/2012  . Skin  cancer   . Heart palpitations 23-May-2011  . Family history of sudden death 05-23-2011  . PALPITATIONS 03/31/2010  . BACK PAIN, LUMBAR 10/13/2009  . HEARTBURN 10/13/2009  . FREQUENCY, URINARY 10/13/2009  . FOOT PAIN 03/31/2008  . Hypothyroidism 01/25/2007  . Osteoporosis 01/25/2007  . OSTEOPENIA 01/25/2007    Past Surgical History:  Procedure Laterality Date  . ABDOMINAL HYSTERECTOMY  07/2007  . bladders sling surgery  07/2007  . COLONOSCOPY  2006  . THYROIDECTOMY      OB History    Gravida  1   Para  1   Term      Preterm      AB      Living  1     SAB      TAB      Ectopic      Multiple      Live Births               Home Medications    Prior to Admission medications   Medication Sig Start Date End Date Taking? Authorizing Provider  b complex vitamins tablet Take 1 tablet by mouth daily.    [provider]  CALCIUM PO Take 1,200 mg by mouth daily.     [provider]  cholecalciferol (VITAMIN D) 1000 UNITS tablet Take 1,000 Units  by mouth every morning.     [provider]  denosumab (PROLIA) 60 MG/ML SOLN injection Inject 60 mg into the skin every 6 (six) months. Administer in upper arm, thigh, or abdomen    [provider]  fluticasone (FLONASE) 50 MCG/ACT nasal spray 1 or 2 sprays each nostril twice a day 08/03/18   Jacqulyn Cane, MD  gabapentin (NEURONTIN) 100 MG capsule Take 100 mg by mouth as needed. 03/26/18   [provider]  pantoprazole (PROTONIX) 20 MG tablet TAKE 1 TABLET BY MOUTH DAILY. OFFICE VISIT NEEDED FOR FURTHER REFILLS 07/05/18   Gatha Mayer, MD  SYNTHROID 88 MCG tablet TAKE 1 TABLET(88 MCG) BY MOUTH DAILY BEFORE BREAKFAST 07/30/18   Panosh, Standley Brooking, MD    Family History Family History  Problem Relation Age of Onset  . Heart attack Mother   . Alcohol abuse Mother   . Kidney cancer Father   . Hypertension Maternal Grandmother   . Esophageal cancer Neg Hx   . Colon cancer Neg Hx   .  Pancreatic cancer Neg Hx   . Stomach cancer Neg Hx     Social History Social History   Tobacco Use  . Smoking status: Never Smoker  . Smokeless tobacco: Never Used  Substance Use Topics  . Alcohol use: Yes    Alcohol/week: 1.0 standard drinks    Types: 1 Glasses of wine per week    Comment: occasionally  . Drug use: No     Allergies   Milk-related compounds; Onion; and Other   Review of Systems Review of Systems  All other systems reviewed and are negative.  Pertinent items noted in HPI and remainder of comprehensive ROS otherwise negative.  Physical Exam Triage Vital Signs ED Triage Vitals  Enc Vitals Group     BP      Pulse      Resp      Temp      Temp src      SpO2      Weight      Height      Head Circumference      Peak Flow      Pain Score      Pain Loc      Pain Edu?      Excl. in [place]?    No data found.  Updated Vital Signs BP (!) 157/77 (BP Location: Right Arm)   Pulse 92   Temp 98.2 F (36.8 C) (Oral)   Resp 18   Ht 5' 3.25" (1.607 m)   Wt 73.9 kg   SpO2 97%   BMI 28.65 kg/m   Visual Acuity Right Eye Distance:   Left Eye Distance:   Bilateral Distance:    Right Eye Near:   Left Eye Near:    Bilateral Near:     Physical Exam Vitals signs and nursing note reviewed.  Constitutional:      General: She is not in acute distress.    Appearance: She is well-developed.  HENT:     Head: Normocephalic and atraumatic.     Right Ear: Tympanic membrane, ear canal and external ear normal.     Left Ear: Tympanic membrane, ear canal and external ear normal.     Nose: Mucosal edema, congestion and rhinorrhea (Minimal, clear) present.     Right Sinus: Maxillary sinus tenderness (Minimal) present. No frontal sinus tenderness.     Left Sinus: Maxillary sinus tenderness (Minimal) present. No frontal sinus tenderness.  Comments: Mildly boggy nasal turbinates    Mouth/Throat:     Mouth: No oral lesions.     Pharynx: No oropharyngeal exudate.    Eyes:     General: No scleral icterus.       Right eye: No discharge.        Left eye: No discharge.  Neck:     Musculoskeletal: Neck supple.  Cardiovascular:     Rate and Rhythm: Normal rate and regular rhythm.     Heart sounds: Normal heart sounds.  Pulmonary:     Effort: Pulmonary effort is normal.     Breath sounds: Normal breath sounds. No wheezing or rales.  Abdominal:     General: There is no distension.     Tenderness: There is no abdominal tenderness.  Lymphadenopathy:     Cervical: No cervical adenopathy.  Skin:    General: Skin is warm and dry.  Neurological:     Mental Status: She is alert and oriented to person, place, and time.      UC Treatments / Results  Labs (all labs ordered are listed, but only abnormal results are displayed) Labs Reviewed  POCT URINALYSIS DIP (MANUAL ENTRY) - Abnormal; Notable for the following components:      Result Value   Leukocytes, UA Trace (*)    All other components within normal limits  URINE CULTURE   Urinalysis negative except trace leukocytes.  This is likely not significant as nitrate negative.  We are sending off a urine culture just to be safe.  EKG None  Radiology No results found.  Procedures Procedures (including critical care time)  Medications Ordered in UC Medications - No data to display  Initial Impression / Assessment and Plan / UC Course  I have reviewed the triage vital signs and the nursing notes.  Pertinent labs & imaging results that were available during my care of the patient were reviewed by me and considered in my medical decision making (see chart for details).      Final Clinical Impressions(s) / UC Diagnoses   Final diagnoses:  Urinary frequency  Acute nasopharyngitis  Seasonal allergic rhinitis due to pollen   Treatment options discussed, as well as risks, benefits, alternatives. Patient voiced understanding and agreement with the following plans:     Discharge  Instructions     You likely have viral sinusitis/upper respiratory infection.  May be an element of allergic rhinitis.  No sign of bacterial infection, and no antibiotic indicated. Advised rest and plenty of fluids.  Please read instruction sheet over-the-counter Zyrtec once a day as needed for sneezing and drainage Flonase, 1 or 2 sprays each nostril twice a day I would expect you to get better within the next week, but if not, follow-up with your PCP. Also, your urine test today is within normal limits    ED Prescriptions    Medication Sig Dispense Auth. Provider   fluticasone (FLONASE) 50 MCG/ACT nasal spray 1 or 2 sprays each nostril twice a day 16 g Jacqulyn Cane, MD     Precautions discussed. Red flags discussed. Questions invited and answered. Patient voiced understanding and agreement.    Jacqulyn Cane, MD 08/03/18 [phone]

## 2018-08-04 LAB — URINE CULTURE
MICRO NUMBER:: 167141
Result:: NO GROWTH
SPECIMEN QUALITY:: ADEQUATE

## 2018-08-05 ENCOUNTER — Telehealth: Payer: Self-pay | Admitting: Emergency Medicine

## 2018-08-05 NOTE — Telephone Encounter (Signed)
LMTRC. TM,CMA

## 2018-08-08 DIAGNOSIS — H25813 Combined forms of age-related cataract, bilateral: Secondary | ICD-10-CM | POA: Diagnosis not present

## 2018-08-08 DIAGNOSIS — D3132 Benign neoplasm of left choroid: Secondary | ICD-10-CM | POA: Diagnosis not present

## 2018-08-08 DIAGNOSIS — H35363 Drusen (degenerative) of macula, bilateral: Secondary | ICD-10-CM | POA: Diagnosis not present

## 2018-08-09 DIAGNOSIS — Z1231 Encounter for screening mammogram for malignant neoplasm of breast: Secondary | ICD-10-CM | POA: Diagnosis not present

## 2018-08-09 LAB — HM MAMMOGRAPHY

## 2018-08-10 ENCOUNTER — Encounter: Payer: Self-pay | Admitting: Internal Medicine

## 2018-08-27 ENCOUNTER — Other Ambulatory Visit: Payer: Self-pay

## 2018-08-27 ENCOUNTER — Ambulatory Visit: Payer: Self-pay

## 2018-08-27 ENCOUNTER — Emergency Department (INDEPENDENT_AMBULATORY_CARE_PROVIDER_SITE_OTHER)
Admission: EM | Admit: 2018-08-27 | Discharge: 2018-08-27 | Disposition: A | Discharge status: Home / Self Care | Payer: Medicare Other | Source: Home / Self Care

## 2018-08-27 DIAGNOSIS — E860 Dehydration: Secondary | ICD-10-CM | POA: Diagnosis not present

## 2018-08-27 DIAGNOSIS — R197 Diarrhea, unspecified: Secondary | ICD-10-CM | POA: Diagnosis not present

## 2018-08-27 DIAGNOSIS — R82998 Other abnormal findings in urine: Secondary | ICD-10-CM | POA: Diagnosis not present

## 2018-08-27 DIAGNOSIS — R112 Nausea with vomiting, unspecified: Secondary | ICD-10-CM

## 2018-08-27 LAB — POCT URINALYSIS DIP (MANUAL ENTRY)
Bilirubin, UA: NEGATIVE
Blood, UA: NEGATIVE
Glucose, UA: NEGATIVE mg/dL
Ketones, POC UA: NEGATIVE mg/dL
Nitrite, UA: NEGATIVE
Spec Grav, UA: 1.025 (ref 1.010–1.025)
Urobilinogen, UA: 0.2 E.U./dL
pH, UA: 5.5 (ref 5.0–8.0)

## 2018-08-27 LAB — POCT CBC W AUTO DIFF (K'VILLE URGENT CARE)

## 2018-08-27 MED ORDER — ONDANSETRON 4 MG PO TBDP: 4.0000 mg | ORAL_TABLET | Freq: Three times a day (TID) | ORAL | 0 refills | Status: DC | PRN

## 2018-08-27 NOTE — Telephone Encounter (Signed)
Noted  

## 2018-08-27 NOTE — ED Triage Notes (Signed)
Diarrhea started last night.  Today started running a fever this morning.  Highest 100.  Is not drinking a lot of fluids because it causing burping.  Tums, and pepto.  2 anti-diarrheal pills.  Last loose stool was about 330 pm.

## 2018-08-27 NOTE — Discharge Instructions (Signed)
°  Be sure to get a lot of rest and stay well hydrated with sports drinks, water, diluted juices, and clear sodas.  Avoid fried fatty food, spicy food, and milk as these foods can cause worsening stomach upset.  You may try chicken noodle soup, jello, apple sauce, and any other mild food to help stay hydrated and replace electrolytes.   Please follow up with your family doctor in 2 days for recheck of symptoms.  Call 911 or go to the emergency department if symptoms worsening- headache, dizziness, chest pain, severe abdominal pain, unable to keep down fluids, blood in stool, or other concerning symptoms develop.

## 2018-08-27 NOTE — ED Provider Notes (Signed)
[name]    CSN: [id number] Arrival date & time: 08/27/18  1643     History   Chief Complaint Chief Complaint  Patient presents with  . Diarrhea    HPI [name] [name] is a 78 y.o. female.   HPI [name] [name] is a 78 y.o. female presenting to UC with c/o about 15 episodes of diarrhea since early this morning around 3AM.  Temp of 100*F earlier today.  She has not wanted to drink a lot of fluids because it causes beltching.  She has taken Tums, pepto-bismol and 2 antidiarrhea pills. Last episode of loose stool was around 3:30PM this afternoon. She called her PCP who encouraged her to be evaluated in UC for possible dehydration. She did have 1 episode of vomiting this morning and mild nausea. No urinary symptoms. Denies HA or dizziness. Denies chest pain or palpitations. Denies abdominal pain. No known sick contacts, recent travel or recent antibiotic use. No blood in the stool.  She does report eating fried okra at a buffet style restaurant last night, which her husband did not eat.  She wonders if that could have caused her symptoms.    Past Medical History:  Diagnosis Date  . Dog bite of thigh 04/09/2012  . Elevated lipids   . GERD (gastroesophageal reflux disease)   . Hypothyroidism    post thyroidectomy  . Osteopenia    t -2.5 hip in 2005 by Dexa  . Osteoporosis    commpr fx after dog related fall  injury  . Seasonal rhinitis    spring and fall  . Skin cancer    squamous cell ca chest  . Vaginal prolapse    hs corrected    Patient Active Problem List   Diagnosis Date Noted  . Medication management 10/16/2013  . Medicare annual wellness visit, subsequent 07/16/2012  . Skin cancer   . Heart palpitations May 10, 2011  . Family history of sudden death May 10, 2011  . PALPITATIONS 03/31/2010  . BACK PAIN, LUMBAR 10/13/2009  . HEARTBURN 10/13/2009  . FREQUENCY, URINARY 10/13/2009  . FOOT PAIN 03/31/2008  . Hypothyroidism 01/25/2007  . Osteoporosis  01/25/2007  . OSTEOPENIA 01/25/2007    Past Surgical History:  Procedure Laterality Date  . ABDOMINAL HYSTERECTOMY  07/2007  . bladders sling surgery  07/2007  . COLONOSCOPY  2006  . THYROIDECTOMY      OB History    Gravida  1   Para  1   Term      Preterm      AB      Living  1     SAB      TAB      Ectopic      Multiple      Live Births               Home Medications    Prior to Admission medications   Medication Sig Start Date End Date Taking? Authorizing Provider  b complex vitamins tablet Take 1 tablet by mouth daily.    [provider]  CALCIUM PO Take 1,200 mg by mouth daily.     [provider]  cholecalciferol (VITAMIN D) 1000 UNITS tablet Take 1,000 Units by mouth every morning.     [provider]  denosumab (PROLIA) 60 MG/ML SOLN injection Inject 60 mg into the skin every 6 (six) months. Administer in upper arm, thigh, or abdomen    [provider]  fluticasone (FLONASE) 50 MCG/ACT nasal spray 1 or  2 sprays each nostril twice a day 08/03/18   Jacqulyn Cane, MD  gabapentin (NEURONTIN) 100 MG capsule Take 100 mg by mouth as needed. 03/26/18   [provider]  ondansetron (ZOFRAN ODT) 4 MG disintegrating tablet Take 1 tablet (4 mg total) by mouth every 8 (eight) hours as needed. 08/27/18   Noe Gens, PA-C  pantoprazole (PROTONIX) 20 MG tablet TAKE 1 TABLET BY MOUTH DAILY. OFFICE VISIT NEEDED FOR FURTHER REFILLS 07/05/18   Gatha Mayer, MD  SYNTHROID 88 MCG tablet TAKE 1 TABLET(88 MCG) BY MOUTH DAILY BEFORE BREAKFAST 07/30/18   Panosh, Standley Brooking, MD    Family History Family History  Problem Relation Age of Onset  . Heart attack Mother   . Alcohol abuse Mother   . Kidney cancer Father   . Hypertension Maternal Grandmother   . Esophageal cancer Neg Hx   . Colon cancer Neg Hx   . Pancreatic cancer Neg Hx   . Stomach cancer Neg Hx     Social History Social History   Tobacco Use  . Smoking status:  Never Smoker  . Smokeless tobacco: Never Used  Substance Use Topics  . Alcohol use: Yes    Alcohol/week: 1.0 standard drinks    Types: 1 Glasses of wine per week    Comment: occasionally  . Drug use: No     Allergies   Milk-related compounds; Onion; and Other   Review of Systems Review of Systems  Constitutional: Negative for chills and fever.  HENT: Negative for congestion, ear pain, sore throat, trouble swallowing and voice change.   Respiratory: Negative for cough and shortness of breath.   Cardiovascular: Negative for chest pain and palpitations.  Gastrointestinal: Positive for diarrhea. Negative for abdominal pain, nausea and vomiting.  Musculoskeletal: Negative for arthralgias, back pain and myalgias.  Skin: Negative for rash.     Physical Exam Triage Vital Signs ED Triage Vitals  Enc Vitals Group     BP 08/27/18 1718 (!) 143/83     Pulse Rate 08/27/18 1718 91     Resp 08/27/18 1718 20     Temp 08/27/18 1718 98.2 F (36.8 C)     Temp Source 08/27/18 1718 Oral     SpO2 08/27/18 1718 99 %     Weight 08/27/18 1711 161 lb (73 kg)     Height 08/27/18 1711 5' 1.5" (1.562 m)     Head Circumference --      Peak Flow --      Pain Score 08/27/18 1714 0     Pain Loc --      Pain Edu? --      Excl. in [place]? --    No data found.  Updated Vital Signs BP (!) 143/83 (BP Location: Right Arm)   Pulse 91   Temp 98.2 F (36.8 C) (Oral)   Resp 20   Ht 5' 1.5" (1.562 m)   Wt 161 lb (73 kg)   SpO2 99%   BMI 29.93 kg/m   Visual Acuity Right Eye Distance:   Left Eye Distance:   Bilateral Distance:    Right Eye Near:   Left Eye Near:    Bilateral Near:     Physical Exam Vitals signs and nursing note reviewed.  Constitutional:      Appearance: Normal appearance. She is well-developed.  HENT:     Head: Normocephalic and atraumatic.     Right Ear: Tympanic membrane normal.     Left Ear: Tympanic membrane  normal.     Nose: Nose normal.     Right Sinus: No  maxillary sinus tenderness or frontal sinus tenderness.     Left Sinus: No maxillary sinus tenderness or frontal sinus tenderness.     Mouth/Throat:     Lips: Pink.     Mouth: Mucous membranes are moist.     Pharynx: Oropharynx is clear. Uvula midline.  Neck:     Musculoskeletal: Normal range of motion.  Cardiovascular:     Rate and Rhythm: Normal rate and regular rhythm.  Pulmonary:     Effort: Pulmonary effort is normal. No respiratory distress.     Breath sounds: Normal breath sounds. No stridor. No wheezing or rhonchi.  Abdominal:     General: Bowel sounds are normal. There is no distension.     Palpations: Abdomen is soft. There is no mass.     Tenderness: There is no abdominal tenderness. There is no right CVA tenderness, left CVA tenderness, guarding or rebound.     Hernia: No hernia is present.  Musculoskeletal: Normal range of motion.  Skin:    General: Skin is warm and dry.  Neurological:     Mental Status: She is alert and oriented to person, place, and time.  Psychiatric:        Behavior: Behavior normal.      UC Treatments / Results  Labs (all labs ordered are listed, but only abnormal results are displayed) Labs Reviewed  COMPLETE METABOLIC PANEL WITH GFR - Abnormal; Notable for the following components:      Result Value   Total Bilirubin 1.6 (*)    All other components within normal limits  POCT URINALYSIS DIP (MANUAL ENTRY) - Abnormal; Notable for the following components:   Protein Ur, POC trace (*)    Leukocytes, UA Small (1+) (*)    All other components within normal limits  GASTROINTESTINAL PANEL BY PCR, STOOL (REPLACES STOOL CULTURE)  URINE CULTURE  POCT CBC W AUTO DIFF ([facility])    EKG None  Radiology No results found.  Procedures Procedures (including critical care time)  Medications Ordered in UC Medications - No data to display  Initial Impression / Assessment and Plan / UC Course  I have reviewed the triage vital signs  and the nursing notes.  Pertinent labs & imaging results that were available during my care of the patient were reviewed by me and considered in my medical decision making (see chart for details).     Hx and exam not concerning for acute abdomen. Doubt appendicitis, cholecystitis or mesenteric ischemia.  Possible viral infection vs bacterial given reports of possibly eating bad food last night.  No evidence of severe dehydration at this time. UA: possible mild dehydration vs early UTI Culture sent  Pt has not had any BMs since 3:30PM, unable to provide stool sample in UC Pt encouraged to return if stool frequency picked up again. Encouraged good oral hydration at home F/u with PCP  Discussed symptoms that warrant emergent care in the ED.   Final Clinical Impressions(s) / UC Diagnoses   Final diagnoses:  Diarrhea of presumed infectious origin  Nausea and vomiting, intractability of vomiting not specified, unspecified vomiting type  Mild dehydration  Urine white blood cells increased     Discharge Instructions      Be sure to get a lot of rest and stay well hydrated with sports drinks, water, diluted juices, and clear sodas.  Avoid fried fatty food, spicy food, and milk  as these foods can cause worsening stomach upset.  You may try chicken noodle soup, jello, apple sauce, and any other mild food to help stay hydrated and replace electrolytes.   Please follow up with your family doctor in 2 days for recheck of symptoms.  Call 911 or go to the emergency department if symptoms worsening- headache, dizziness, chest pain, severe abdominal pain, unable to keep down fluids, blood in stool, or other concerning symptoms develop.    ED Prescriptions    Medication Sig Dispense Auth. Provider   ondansetron (ZOFRAN ODT) 4 MG disintegrating tablet Take 1 tablet (4 mg total) by mouth every 8 (eight) hours as needed. 4 tablet Noe Gens, PA-C     Controlled Substance  Prescriptions [place] Controlled Substance Registry consulted? Not Applicable   Tyrell Antonio 08/28/18 1610

## 2018-08-27 NOTE — Telephone Encounter (Signed)
Phone call returned to pt.  reported onset of diarrhea, nausea, vomiting, and fever at 3:00 AM.  Reported 15-16 diarrhea stools since onset.  Described diarrhea as being watery with loose brown stool. Denied blood in stool.  Has vomited x 1.  Denied abdominal pain or cramping.  Denied weakness, but reported she does not have the energy she usually does.  Unsure if she is having adequate urine output, due to the frequent diarrhea stools.  Denied dizziness. C/o dry mouth.  Reported she has not drank much oral fluids during the day, today.  Has taken sips of Gingerale.  Stated she has a "burning" in her throat and upper chest, "like heartburn".  Temp. was 100 degrees, earlier today.  Temp. @ 3:25 PM was 99.2  Stated has intermittent nausea.  Stated she has taken an OTC anti-diarrheal medication x 2 without much effect.  Advised she should be eval. at UC due to severity of diarrhea, due to concern for dehydration.  Verb. Understanding.       Reason for Disposition . [1] SEVERE diarrhea (e.g., 7 or more times / day more than normal) AND [2] age > 60 years  Answer Assessment - Initial Assessment Questions 1. DIARRHEA SEVERITY: "How bad is the diarrhea?" "How many extra stools have you had in the past 24 hours than normal?"    - NO DIARRHEA (SCALE 0)   - MILD (SCALE 1-3): Few loose or mushy BMs; increase of 1-3 stools over normal daily number of stools; mild increase in ostomy output.   -  MODERATE (SCALE 4-7): Increase of 4-6 stools daily over normal; moderate increase in ostomy output. * SEVERE (SCALE 8-10; OR 'WORST POSSIBLE'): Increase of 7 or more stools daily over normal; moderate increase in ostomy output; incontinence.     Severe; about 15-16 times  2. ONSET: "When did the diarrhea begin?"      At 3:00 AM 3. BM CONSISTENCY: "How loose or watery is the diarrhea?"      Watery with brown loose stools 4. VOMITING: "Are you also vomiting?" If so, ask: "How many times in the past 24 hours?"     Once  5.  ABDOMINAL PAIN: "Are you having any abdominal pain?" If yes: "What does it feel like?" (e.g., crampy, dull, intermittent, constant)      Denied abdominal pain.   6. ABDOMINAL PAIN SEVERITY: If present, ask: "How bad is the pain?"  (e.g., Scale 1-10; mild, moderate, or severe)   - MILD (1-3): doesn't interfere with normal activities, abdomen soft and not tender to touch    - MODERATE (4-7): interferes with normal activities or awakens from sleep, tender to touch    - SEVERE (8-10): excruciating pain, doubled over, unable to do any normal activities       N/a  7. ORAL INTAKE: If vomiting, "Have you been able to drink liquids?" "How much fluids have you had in the past 24 hours?"     Poor fluid intake since onset of diarrhea 8. HYDRATION: "Any signs of dehydration?" (e.g., dry mouth [not just dry lips], too weak to stand, dizziness, new weight loss) "When did you last urinate?"     Denied dizziness, denied weakness, but has less energy than normal, unsure if she is urinating in adequate amts., some dryness of mouth   9. EXPOSURE: "Have you traveled to a foreign country recently?" "Have you been exposed to anyone with diarrhea?" "Could you have eaten any food that was spoiled?"  Unknown 10. ANTIBIOTIC USE: "Are you taking antibiotics now or have you taken antibiotics in the past 2 months?"       Denied  11. OTHER SYMPTOMS: "Do you have any other symptoms?" (e.g., fever, blood in stool)       Fever, denied blood in stool, denied body aches; nausea but has improved some since onset; c/o some burning in throat like indigestion; c/o burping   12. PREGNANCY: "Is there any chance you are pregnant?" "When was your last menstrual period?"       N/a  Protocols used: [facility]  Message from Carolyn Stare sent at 08/27/2018 2:23 PM EST    Pt said she has a slight temp and her throat burning not sure what is going on

## 2018-08-28 ENCOUNTER — Telehealth: Payer: Self-pay

## 2018-08-28 LAB — URINE CULTURE
MICRO NUMBER:: 263931
Result:: NO GROWTH
SPECIMEN QUALITY:: ADEQUATE

## 2018-08-28 LAB — COMPLETE METABOLIC PANEL WITH GFR
AG Ratio: 1.8 (calc) (ref 1.0–2.5)
ALT: 16 U/L (ref 6–29)
AST: 28 U/L (ref 10–35)
Albumin: 4.5 g/dL (ref 3.6–5.1)
Alkaline phosphatase (APISO): 39 U/L (ref 37–153)
BUN: 14 mg/dL (ref 7–25)
CO2: 23 mmol/L (ref 20–32)
Calcium: 9.7 mg/dL (ref 8.6–10.4)
Chloride: 105 mmol/L (ref 98–110)
Creat: 0.83 mg/dL (ref 0.60–0.93)
GFR, Est African American: 79 mL/min/{1.73_m2} (ref >=60)
GFR, Est Non African American: 68 mL/min/{1.73_m2} (ref >=60)
Globulin: 2.5 g/dL (calc) (ref 1.9–3.7)
Glucose, Bld: 95 mg/dL (ref 65–99)
Potassium: 4.1 mmol/L (ref 3.5–5.3)
Sodium: 139 mmol/L (ref 135–146)
Total Bilirubin: 1.6 mg/dL — ABNORMAL HIGH (ref 0.2–1.2)
Total Protein: 7 g/dL (ref 6.1–8.1)

## 2018-08-28 NOTE — Telephone Encounter (Signed)
Feeling better.  Given lab results.  Will follow up as needed.

## 2018-08-29 ENCOUNTER — Ambulatory Visit (INDEPENDENT_AMBULATORY_CARE_PROVIDER_SITE_OTHER): Payer: Medicare Other | Admitting: Internal Medicine

## 2018-08-29 ENCOUNTER — Encounter: Payer: Self-pay | Admitting: Internal Medicine

## 2018-08-29 VITALS — BP 120/64 | HR 75 | Temp 98.4°F | Wt 162.4 lb

## 2018-08-29 DIAGNOSIS — R197 Diarrhea, unspecified: Secondary | ICD-10-CM

## 2018-08-29 DIAGNOSIS — R899 Unspecified abnormal finding in specimens from other organs, systems and tissues: Secondary | ICD-10-CM

## 2018-08-29 NOTE — Progress Notes (Signed)
Chief Complaint  Patient presents with  . Follow-up    Pt went to urgent care on monday afternoon. pt states she had really bad diarrhea. Then after eating last night she started having diarrhea and then later on felt really nausea until this morning. Pt took anti-diarrhea meds and states it made her feel better     HPI: [name] [name] 78 y.o. come in for  sda   Seen in urgent care  3 /2 fo r   Profuse diarrhea  Concern about dehyration  She sought care as recommended in the urgent care or ED.  Had lab work done urine culture was not felt to have an acute abdomen and told work on hydration. yesterday felt better   Martin Majestic out to eating  1/2 club sandwich . And then diarrhea again   3-4 x in 30 minutes took Imodium yesterday came in today because was concerned.  Her diarrhea yesterday was not as bad as as initial onset. No blood  Yesterday  Did well  And no episode last nigh t.  Temp 99.2  And non since then.  Last dose of immodium   [facility] brand last taken at 10 pm and  Around 10 .  No recnet antibiotic.  No one else sick at home and no recent travel.   UC ed assessment   Hx and exam not concerning for acute abdomen. Doubt appendicitis, cholecystitis or mesenteric ischemia.  Possible viral infection vs bacterial given reports of possibly eating bad food last night.  No evidence of severe dehydration at this time. UA: possible mild dehydration vs early UTI Culture sent  Pt has not had any BMs since 3:30PM, unable to provide stool sample in UC Pt encouraged to return if stool frequency picked up again. Encouraged good oral hydration at home F/u with PCP   ROS: See pertinent positives and negatives per HPI.  Past Medical History:  Diagnosis Date  . Dog bite of thigh 04/09/2012  . Elevated lipids   . GERD (gastroesophageal reflux disease)   . Hypothyroidism    post thyroidectomy  . Osteopenia    t -2.5 hip in 2005 by Dexa  . Osteoporosis    commpr fx after dog related fall   injury  . Seasonal rhinitis    spring and fall  . Skin cancer    squamous cell ca chest  . Vaginal prolapse    hs corrected    Family History  Problem Relation Age of Onset  . Heart attack Mother   . Alcohol abuse Mother   . Kidney cancer Father   . Hypertension Maternal Grandmother   . Esophageal cancer Neg Hx   . Colon cancer Neg Hx   . Pancreatic cancer Neg Hx   . Stomach cancer Neg Hx     Social History   Socioeconomic History  . Marital status: Married    Spouse name: Not on file  . Number of children: Not on file  . Years of education: Not on file  . Highest education level: Not on file  Occupational History  . Not on file  Social Needs  . Financial resource strain: Not on file  . Food insecurity:    Worry: Not on file    Inability: Not on file  . Transportation needs:    Medical: Not on file    Non-medical: Not on file  Tobacco Use  . Smoking status: Never Smoker  . Smokeless tobacco: Never Used  Substance and Sexual Activity  .  Alcohol use: Yes    Alcohol/week: 1.0 standard drinks    Types: 1 Glasses of wine per week    Comment: occasionally  . Drug use: No  . Sexual activity: Not Currently    Comment: 1st itnercourse- 19, partners- 2, married- 47 yrs   Lifestyle  . Physical activity:    Days per week: Not on file    Minutes per session: Not on file  . Stress: Not on file  Relationships  . Social connections:    Talks on phone: Not on file    Gets together: Not on file    Attends religious service: Not on file    Active member of club or organization: Not on file    Attends meetings of clubs or organizations: Not on file    Relationship status: Not on file  Other Topics Concern  . 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 b complex  vitamins tablet Take 1 tablet by mouth daily.    Marland Kitchen CALCIUM PO Take 1,200 mg by mouth daily.     . cholecalciferol (VITAMIN D) 1000 UNITS tablet Take 1,000 Units by mouth every morning.     . denosumab (PROLIA) 60 MG/ML SOLN injection Inject 60 mg into the skin every 6 (six) months. Administer in upper arm, thigh, or abdomen    . fluticasone (FLONASE) 50 MCG/ACT nasal spray 1 or 2 sprays each nostril twice a day 16 g 0  . gabapentin (NEURONTIN) 100 MG capsule Take 100 mg by mouth as needed.  3  . ondansetron (ZOFRAN ODT) 4 MG disintegrating tablet Take 1 tablet (4 mg total) by mouth every 8 (eight) hours as needed. 4 tablet 0  . pantoprazole (PROTONIX) 20 MG tablet TAKE 1 TABLET BY MOUTH DAILY. OFFICE VISIT NEEDED FOR FURTHER REFILLS 90 tablet 0  . SYNTHROID 88 MCG tablet TAKE 1 TABLET(88 MCG) BY MOUTH DAILY BEFORE BREAKFAST 30 tablet 1   No facility-administered medications prior to visit.      EXAM:  BP 120/64 (BP Location: Right Arm, Patient Position: Sitting, Cuff Size: Normal)   Pulse 75   Temp 98.4 F (36.9 C) (Oral)   Wt 162 lb 6.4 oz (73.7 kg)   SpO2 96%   BMI 30.19 kg/m   Body mass index is 30.19 kg/m.  GENERAL: vitals reviewed and listed above, alert, oriented, appears well hydrated and in no acute distress she is well-groomed and appears well-hydrated nontoxic. HEENT: atraumatic, conjunctiva  clear, no obvious abnormalities on inspection of external nose and ears OP : no lesion edema or exudate moist mucous membranes. NECK: no obvious masses on inspection palpation  LUNGS: clear to auscultation bilaterally, no wheezes, rales or rhonchi, good air movement CV: HRRR, no clubbing cyanosis or  peripheral edema nl cap refill  Abdomen bowel sounds are normal to increased no guarding rebound or obvious masses. MS: moves all extremities without noticeable focal  abnormality PSYCH: pleasant and cooperative, no obvious depression or anxiety Lab Results  Component Value Date   WBC  5.8 05/11/2018   HGB 12.8 05/11/2018   HCT 38.0 05/11/2018   PLT 282.0 05/11/2018   GLUCOSE 95 08/27/2018   CHOL 196 05/11/2018  TRIG 93.0 05/11/2018   HDL 56.40 05/11/2018   LDLDIRECT 134.1 12/27/2006   LDLCALC 121 (H) 05/11/2018   ALT 16 08/27/2018   AST 28 08/27/2018   NA 139 08/27/2018   K 4.1 08/27/2018   CL 105 08/27/2018   CREATININE 0.83 08/27/2018   BUN 14 08/27/2018   CO2 23 08/27/2018   TSH 4.57 (H) 05/11/2018   INR 1.0 07/25/2007   BP Readings from Last 3 Encounters:  08/29/18 120/64  08/27/18 (!) 143/83  08/03/18 (!) 157/77   Wt Readings from Last 3 Encounters:  08/29/18 162 lb 6.4 oz (73.7 kg)  08/27/18 161 lb (73 kg)  08/03/18 163 lb (73.9 kg)    Laboratory studies reviewed with patient only abnormality was slightly elevated total bili but normal normal transaminases urine +1 leukocytes but negative urine culture.  ASSESSMENT AND PLAN:  Discussed the following assessment and plan:  Acute diarrhea  Abnormal laboratory test result Her pattern is still consistent with resolving acute gastroenteritis with no complications.  Her exam is quite benign which is fortunate and she does not appear to be high dehydrated discussed keeping up with hydration expected bowel irritability and increased motility but okay to keep up nutrition.  If not improving or ongoing worsening next week blood fever etc. then contact us for follow-up otherwise expected to resolve over the next week or so. Warned her that if using Imodium on a regular basis she could have constipation .    Total visit 67mins > 50% spent counseling and coordinating care as indicated in above note and in instructions to patient .  Disc poss of post infectious IIBs at this time no need to do further lab testing  Based on exam etc.  -Patient advised to return or notify health care team  if  new concerns arise.  Patient Instructions  Liquids more improtant than solids   But ok to eat light    May trigger     the reflex .    To have a loose bowel movement   But that is not dangerous and bowel should recover   Over time.   Next week should be a lot better although  Still could have some   Symptoms left .  If gettting fever chills   Blood pain  Or not improving next week then contact us for  Reevaluation.     Standley Brooking. Fionn Stracke M.D.

## 2018-08-29 NOTE — Patient Instructions (Signed)
Liquids more improtant than solids   But ok to eat light    May trigger    the reflex .    To have a loose bowel movement   But that is not dangerous and bowel should recover   Over time.   Next week should be a lot better although  Still could have some   Symptoms left .  If gettting fever chills   Blood pain  Or not improving next week then contact us for  Reevaluation.

## 2018-08-31 ENCOUNTER — Telehealth: Payer: Self-pay | Admitting: *Deleted

## 2018-08-31 NOTE — Telephone Encounter (Signed)
Called pt to let her know that her insurance denied an rx for Zofran ODT. She reports that she has already paid cash for the rx.

## 2018-09-03 ENCOUNTER — Telehealth: Payer: Self-pay | Admitting: *Deleted

## 2018-09-03 NOTE — Telephone Encounter (Signed)
Prolia insurance verification has been sent awaiting Summary of benefits  

## 2018-09-11 NOTE — Telephone Encounter (Signed)
Annual exam 02/19/18  Calcium 9.7       Date 08/27/2018  Upcoming dental procedures   Prior Authorization needed NO  Pt estimated Cost $0  Pt states she wants to wait till end of March to get Prolia due to [place] 09/25/2018 @ 11:00     Coverage Details: This is a [employer] F Plan and it covers the medicare Part B deductible and 100% of the excess charges

## 2018-09-19 ENCOUNTER — Telehealth: Payer: Self-pay

## 2018-09-19 NOTE — Telephone Encounter (Signed)
Author phoned pt. to offer to do virtual AWV with PCP. Pt. Agreed to do visit with PCP on 4/8. Appointment notes updated. CMA to schedule webex meeting per practice administrator's request.

## 2018-09-25 ENCOUNTER — Other Ambulatory Visit: Payer: Self-pay | Admitting: Internal Medicine

## 2018-09-25 ENCOUNTER — Ambulatory Visit: Payer: Medicare Other

## 2018-09-27 NOTE — Telephone Encounter (Signed)
R/s Prolia inj to 10/16/2018 due to [place]

## 2018-10-03 ENCOUNTER — Ambulatory Visit: Payer: Medicare Other | Admitting: Internal Medicine

## 2018-10-16 ENCOUNTER — Other Ambulatory Visit: Payer: Self-pay

## 2018-10-16 ENCOUNTER — Ambulatory Visit (INDEPENDENT_AMBULATORY_CARE_PROVIDER_SITE_OTHER): Payer: Medicare Other | Admitting: Anesthesiology

## 2018-10-16 DIAGNOSIS — M810 Age-related osteoporosis without current pathological fracture: Secondary | ICD-10-CM

## 2018-10-16 MED ORDER — DENOSUMAB 60 MG/ML ~~LOC~~ SOSY
60.0000 mg | PREFILLED_SYRINGE | Freq: Once | SUBCUTANEOUS | Status: AC
  Administered 2018-10-16: 60 mg via SUBCUTANEOUS

## 2018-10-16 NOTE — Telephone Encounter (Signed)
Injection given 10/16/18 KW CMA

## 2018-10-24 ENCOUNTER — Encounter: Payer: Self-pay | Admitting: Anesthesiology

## 2018-11-16 ENCOUNTER — Telehealth: Payer: Self-pay | Admitting: *Deleted

## 2018-11-16 NOTE — Telephone Encounter (Signed)
Copied from [place] [id number]. Topic: General - Other >> Nov 16, 2018  3:52 PM Oneta Rack wrote: Relation to pt: self  Call back number: [phone]  Reason for call:  Patient would like antibody test at [facility] due to [place], please advise. Not experiencing any symptoms

## 2018-11-20 ENCOUNTER — Ambulatory Visit (INDEPENDENT_AMBULATORY_CARE_PROVIDER_SITE_OTHER): Payer: Medicare Other | Admitting: Internal Medicine

## 2018-11-20 ENCOUNTER — Encounter: Payer: Self-pay | Admitting: Internal Medicine

## 2018-11-20 ENCOUNTER — Other Ambulatory Visit: Payer: Self-pay

## 2018-11-20 DIAGNOSIS — Z0184 Encounter for antibody response examination: Secondary | ICD-10-CM | POA: Diagnosis not present

## 2018-11-20 DIAGNOSIS — E039 Hypothyroidism, unspecified: Secondary | ICD-10-CM

## 2018-11-20 DIAGNOSIS — Z7189 Other specified counseling: Secondary | ICD-10-CM | POA: Diagnosis not present

## 2018-11-20 NOTE — Telephone Encounter (Signed)
Need ov or VV  To discuss  Ordering test

## 2018-11-20 NOTE — Telephone Encounter (Signed)
Pt was scheduled for telephone visit

## 2018-11-20 NOTE — Progress Notes (Signed)
    Virtual Visit via Telephone Note  I connected with@ on 11/20/18 at  2:00 PM EDT by telephone and verified that I am speaking with the correct person using two identifiers.   I discussed the limitations, risks, security and privacy concerns of performing an evaluation and management service by telephone and the availability of in person appointments. I also discussed with the patient that there may be a patient responsible charge related to this service. The patient expressed understanding and agreed to proceed.  Location patient: home Location provider: work  office Participants present for the call: patient, provider Patient did not have a visit in the prior 7 days to address this/these issue(s).   History of Present Illness: Janasia Coverdale presents for phone visit would like to get sars cov2 aby testing  She has  not been sick recently but did  Have a severw gi illness with fever in March  And wonders if could have had it.   She will be going back to work in near future and she travel in [place] area   And ask about.  She has not done international travel or had known exposures to covid 19  She is due for thyroid testing and last  Yearly viist was in November    Observations/Objective: Patient sounds cheerful and well on the phone. I do not appreciate any SOB. Speech and thought processing are grossly intact. Patient reported vitals:  Assessment and Plan: Asking about  aby testing  Disc   lmimtations of any results received    ie that pos test doesn't  Ensure immunity .    Risk benefit of testing  discussed.  Including cost   She is also due for thyroid  tesing to be done also .  Follow Up Instructions:  Lab today  As discussed and yearly check in or around November  20   [id number] 5-10 [id number] 11-20 [id number] 21-30 I did not refer this patient for an OV in the next 24 hours for this/these issue(s).  I discussed the assessment and treatment plan with the patient. The patient was  provided an opportunity to ask questions and all were answered. The patient agreed with the plan and demonstrated an understanding of the instructions.   The patient was advised to call back or seek an in-person evaluation if the symptoms worsen or if the condition fails to improve as anticipated.  I provided 14 minutes of non-face-to-face time during this encounter.   Shanon Ace, MD

## 2018-11-21 LAB — SAR COV2 SEROLOGY (COVID19)AB(IGG),IA: SARS CoV2 AB IGG: NEGATIVE

## 2018-11-21 LAB — TSH: TSH: 3.28 u[IU]/mL (ref 0.35–4.50)

## 2018-11-21 LAB — T4, FREE: Free T4: 1.02 ng/dL (ref 0.60–1.60)

## 2018-12-25 DIAGNOSIS — L905 Scar conditions and fibrosis of skin: Secondary | ICD-10-CM | POA: Diagnosis not present

## 2018-12-25 DIAGNOSIS — L821 Other seborrheic keratosis: Secondary | ICD-10-CM | POA: Diagnosis not present

## 2018-12-25 DIAGNOSIS — D692 Other nonthrombocytopenic purpura: Secondary | ICD-10-CM | POA: Diagnosis not present

## 2018-12-25 DIAGNOSIS — Z85828 Personal history of other malignant neoplasm of skin: Secondary | ICD-10-CM | POA: Diagnosis not present

## 2018-12-25 DIAGNOSIS — S1096XA Insect bite of unspecified part of neck, initial encounter: Secondary | ICD-10-CM | POA: Diagnosis not present

## 2019-01-01 DIAGNOSIS — X58XXXD Exposure to other specified factors, subsequent encounter: Secondary | ICD-10-CM | POA: Diagnosis not present

## 2019-01-01 DIAGNOSIS — S32010S Wedge compression fracture of first lumbar vertebra, sequela: Secondary | ICD-10-CM | POA: Diagnosis not present

## 2019-01-01 DIAGNOSIS — M533 Sacrococcygeal disorders, not elsewhere classified: Secondary | ICD-10-CM | POA: Diagnosis not present

## 2019-01-01 DIAGNOSIS — S32010D Wedge compression fracture of first lumbar vertebra, subsequent encounter for fracture with routine healing: Secondary | ICD-10-CM | POA: Diagnosis not present

## 2019-01-01 DIAGNOSIS — M5416 Radiculopathy, lumbar region: Secondary | ICD-10-CM | POA: Diagnosis not present

## 2019-01-01 DIAGNOSIS — M542 Cervicalgia: Secondary | ICD-10-CM | POA: Diagnosis not present

## 2019-01-06 ENCOUNTER — Other Ambulatory Visit: Payer: Self-pay | Admitting: Internal Medicine

## 2019-01-06 ENCOUNTER — Other Ambulatory Visit: Payer: Self-pay

## 2019-01-08 NOTE — Telephone Encounter (Signed)
If she is doing ok does not need a f/u  Would like for her to get through Dr. Regis Bill if possible, iotherwise will need a f/u  Fine to renew for another 90 d if necessary

## 2019-01-09 NOTE — Telephone Encounter (Signed)
I can take over her protonix if she is continueing to do well .  Can send in 90 days  Of protonix with 1 refill  When needed

## 2019-01-28 ENCOUNTER — Encounter: Payer: Self-pay | Admitting: Anesthesiology

## 2019-01-30 ENCOUNTER — Other Ambulatory Visit: Payer: Self-pay

## 2019-01-30 DIAGNOSIS — R6889 Other general symptoms and signs: Secondary | ICD-10-CM | POA: Diagnosis not present

## 2019-01-30 DIAGNOSIS — Z20822 Contact with and (suspected) exposure to covid-19: Secondary | ICD-10-CM

## 2019-01-31 LAB — NOVEL CORONAVIRUS, NAA: SARS-CoV-2, NAA: NOT DETECTED

## 2019-02-13 DIAGNOSIS — Z20828 Contact with and (suspected) exposure to other viral communicable diseases: Secondary | ICD-10-CM | POA: Diagnosis not present

## 2019-02-14 DIAGNOSIS — Z23 Encounter for immunization: Secondary | ICD-10-CM | POA: Diagnosis not present

## 2019-02-20 ENCOUNTER — Other Ambulatory Visit: Payer: Self-pay

## 2019-02-21 ENCOUNTER — Ambulatory Visit (INDEPENDENT_AMBULATORY_CARE_PROVIDER_SITE_OTHER): Payer: Medicare Other | Admitting: Obstetrics & Gynecology

## 2019-02-21 ENCOUNTER — Encounter: Payer: Self-pay | Admitting: Obstetrics & Gynecology

## 2019-02-21 VITALS — BP 138/90 | Ht 60.0 in | Wt 173.0 lb

## 2019-02-21 DIAGNOSIS — M810 Age-related osteoporosis without current pathological fracture: Secondary | ICD-10-CM

## 2019-02-21 DIAGNOSIS — Z01419 Encounter for gynecological examination (general) (routine) without abnormal findings: Secondary | ICD-10-CM

## 2019-02-21 DIAGNOSIS — Z90711 Acquired absence of uterus with remaining cervical stump: Secondary | ICD-10-CM

## 2019-02-21 NOTE — Patient Instructions (Signed)
  1. Well female exam with routine gynecological exam Normal gynecologic exam status post supracervical hysterectomy with sacrocolpopexy.  Pap test in 2018 was negative, no indication to repeat this year.  Breast exam normal.  Screening mammogram February 2020 was negative.  Health labs with family physician.  Cologuard -4 years ago.  Body mass index stable at 33+, continue with fitness and healthy nutrition.  2. Status post laparoscopic supracervical hysterectomy  3. Age-related osteoporosis without current pathological fracture Osteoporosis on bone density October 2019.  Well on Prolia treatment, will continue.  Continue vitamin D supplements, calcium intake of 1200 mg daily and regular weightbearing physical activities.  Will repeat a bone density in October 2021.  [name] [name], very nice seeing you today as always!

## 2019-02-21 NOTE — Progress Notes (Signed)
[name] [name] 02-04-1941 [id number]   History:    78 y.o. G1P1L1 Married  RP:  Established patient presenting for annual gyn exam   HPI: H/O Supracervical Hysterectomy/BSO with Sacrocolpopexy and Sling Procedure in 07/2007.  Menopause, well on no HRT x last year.  No pelvic pain.  Rarely sexually active.  No stress urinary incontinence.  Bowel movements normal.  Breasts normal.  BMI 33.79.  Very active, walking/doing stairs every day.  On Prolia for osteoporosis.  Health labs with family physician.  Past medical history,surgical history, family history and social history were all reviewed and documented in the EPIC chart.  Gynecologic History No LMP recorded. Patient has had a hysterectomy. Contraception: Supracervical Hysterectomy Last Pap: 12/2016. Results were: Negative Last mammogram: 07/2018. Results were: Benign Bone Density: 03/2018 Osteoporosis on Prolia Colonoscopy: Cologard 4 yrs ago  Obstetric History OB History  Gravida Para Term Preterm AB Living  1 1       1   SAB TAB Ectopic Multiple Live Births               # Outcome Date GA Lbr Len/2nd Weight Sex Delivery Anes PTL Lv  1 Para              ROS: A ROS was performed and pertinent positives and negatives are included in the history.  GENERAL: No fevers or chills. HEENT: No change in vision, no earache, sore throat or sinus congestion. NECK: No pain or stiffness. CARDIOVASCULAR: No chest pain or pressure. No palpitations. PULMONARY: No shortness of breath, cough or wheeze. GASTROINTESTINAL: No abdominal pain, nausea, vomiting or diarrhea, melena or bright red blood per rectum. GENITOURINARY: No urinary frequency, urgency, hesitancy or dysuria. MUSCULOSKELETAL: No joint or muscle pain, no back pain, no recent trauma. DERMATOLOGIC: No rash, no itching, no lesions. ENDOCRINE: No polyuria, polydipsia, no heat or cold intolerance. No recent change in weight. HEMATOLOGICAL: No anemia or easy bruising or bleeding. NEUROLOGIC:  No headache, seizures, numbness, tingling or weakness. PSYCHIATRIC: No depression, no loss of interest in normal activity or change in sleep pattern.     Exam:   BP 138/90   Ht 5' (1.524 m)   Wt 173 lb (78.5 kg)   BMI 33.79 kg/m   Body mass index is 33.79 kg/m.  General appearance : Well developed well nourished female. No acute distress HEENT: Eyes: no retinal hemorrhage or exudates,  Neck supple, trachea midline, no carotid bruits, no thyroidmegaly Lungs: Clear to auscultation, no rhonchi or wheezes, or rib retractions  Heart: Regular rate and rhythm, no murmurs or gallops Breast:Examined in sitting and supine position were symmetrical in appearance, no palpable masses or tenderness,  no skin retraction, no nipple inversion, no nipple discharge, no skin discoloration, no axillary or supraclavicular lymphadenopathy Abdomen: no palpable masses or tenderness, no rebound or guarding Extremities: no edema or skin discoloration or tenderness  Pelvic: Vulva: Normal             Vagina: No gross lesions or discharge  Cervix: No gross lesions or discharge  Uterus absent (supracervical hysterectomy)  Adnexa  Without masses or tenderness  Anus: Normal   Assessment/Plan:  78 y.o. female for annual exam   1. Well female exam with routine gynecological exam Normal gynecologic exam status post supracervical hysterectomy with sacrocolpopexy.  Pap test in 2018 was negative, no indication to repeat this year.  Breast exam normal.  Screening mammogram February 2020 was negative.  Health labs with family physician.  Cologuard -4  years ago.  Body mass index stable at 33+, continue with fitness and healthy nutrition.  2. Status post laparoscopic supracervical hysterectomy  3. Age-related osteoporosis without current pathological fracture Osteoporosis on bone density October 2019.  Well on Prolia treatment, will continue.  Continue vitamin D supplements, calcium intake of 1200 mg daily and regular  weightbearing physical activities.  Will repeat a bone density in October 2021.  Princess Bruins MD, 11:12 AM 02/21/2019

## 2019-03-09 ENCOUNTER — Other Ambulatory Visit: Payer: Self-pay | Admitting: Internal Medicine

## 2019-03-11 ENCOUNTER — Other Ambulatory Visit: Payer: Self-pay | Admitting: Internal Medicine

## 2019-04-07 ENCOUNTER — Other Ambulatory Visit: Payer: Self-pay | Admitting: Internal Medicine

## 2019-04-10 ENCOUNTER — Telehealth: Payer: Self-pay | Admitting: Internal Medicine

## 2019-04-10 NOTE — Telephone Encounter (Signed)
Medication Refill - Medication: pantoprazole (PROTONIX) 20 MG tablet    Has the patient contacted their pharmacy? Yes.   (Agent: If no, request that the patient contact the pharmacy for the refill.) (Agent: If yes, when and what did the pharmacy advise?)  Preferred Pharmacy (with phone number or street name):  [facility] [address], [place] [address]  [place] [address]  Phone: [phone] Fax: [phone]     Agent: Please be advised that RX refills may take up to 3 business days. We ask that you follow-up with your pharmacy.

## 2019-04-11 MED ORDER — PANTOPRAZOLE SODIUM 20 MG PO TBEC: DELAYED_RELEASE_TABLET | ORAL | 0 refills | Status: DC

## 2019-04-11 NOTE — Telephone Encounter (Signed)
Medication sent in. 

## 2019-04-17 ENCOUNTER — Ambulatory Visit (INDEPENDENT_AMBULATORY_CARE_PROVIDER_SITE_OTHER): Payer: Medicare Other | Admitting: Anesthesiology

## 2019-04-17 ENCOUNTER — Other Ambulatory Visit: Payer: Self-pay

## 2019-04-17 ENCOUNTER — Telehealth: Payer: Self-pay | Admitting: *Deleted

## 2019-04-17 DIAGNOSIS — M810 Age-related osteoporosis without current pathological fracture: Secondary | ICD-10-CM | POA: Diagnosis not present

## 2019-04-17 MED ORDER — DENOSUMAB 60 MG/ML ~~LOC~~ SOSY
60.0000 mg | PREFILLED_SYRINGE | Freq: Once | SUBCUTANEOUS | Status: AC
  Administered 2019-04-17: 60 mg via SUBCUTANEOUS

## 2019-04-17 NOTE — Telephone Encounter (Addendum)
Deductible $198 ($172met)  OOP MAX N/A  Annual exam 02/21/2019 ML  Calcium 9.7            Date 08/27/2018  Upcoming dental procedures NO  Prior Authorization needed NO  Pt estimated Cost $0   appt 04/17/2019    Coverage Details: Secondary This is a medicare plan F Plan and it covers the medicare Part B deductible and 100% of the excess charges

## 2019-04-19 ENCOUNTER — Ambulatory Visit: Payer: Medicare Other

## 2019-05-13 NOTE — Progress Notes (Signed)
Chief Complaint  Patient presents with  . Annual Exam    Pt has no concerns   . Medication Management  . Hypothyroidism  . Hyperlipidemia  . Osteoporosis    HPI: [name] [name] 78 y.o. comes in today for yearly visit .Since last visit.  Thyroid  No change medication feelsi find   On prolia for her ostseop[rosis and taking 5000 iu vit d per day  Taking  protonix  Most days otherwise may get   Heart burn.  No new cv sx  Eating well   Health Maintenance  Topic Date Due  . TETANUS/TDAP  11/10/2025  . INFLUENZA VACCINE  Completed  . DEXA SCAN  Completed  . PNA vac Low Risk Adult  Completed   Health Maintenance Review LIFESTYLE:  Exercise:  No recent gyme walking  Runs 2 dogs  Tobacco/ETS: no Alcohol:   ocass  Sugar beverages: Sleep:   6-8   HH:  2  4 pets  Dogs     Hearing:  ok  Vision:  No limitations at present . Last eye check UTD readings   Safety:  Has smoke detector and wears seat belts. . No excess sun exposure. Sees dentist regularly.  Falls: no  Memory: Felt to be good  , no concern from her or her family.  Depression: No anhedonia unusual crying or depressive symptoms  Nutrition: Eats well balanced diet; adequate calcium and vitamin D. No swallowing chewing problems.  Injury: no major injuries in the last six months.  Other healthcare providers:  Reviewed today .  Preventive parameters: up-to-date  Reviewed   ADLS:   There are no problems or need for assistance  driving, feeding, obtaining food, dressing, toileting and bathing, managing money using phone. She is independent.   ROS:  GEN/ HEENT: No fever, significant weight changes sweats headaches vision problems hearing changes, CV/ PULM; No chest pain shortness of breath cough, syncope,edema  change in exercise tolerance. GI /GU: No adominal pain, vomiting, change in bowel habits. No blood in the stool. No significant GU symptoms. SKIN/HEME: ,no acute skin rashes suspicious lesions or  bleeding. No lymphadenopathy, nodules, masses.  NEURO/ PSYCH:  No neurologic signs such as weakness numbness. No depression anxiety. IMM/ Allergy: No unusual infections.  Allergy .   REST of 12 system review negative except as per HPI   Past Medical History:  Diagnosis Date  . Dog bite of thigh 04/09/2012  . Elevated lipids   . GERD (gastroesophageal reflux disease)   . Hypothyroidism    post thyroidectomy  . Osteopenia    t -2.5 hip in 2005 by Dexa  . Osteoporosis    commpr fx after dog related fall  injury  . Seasonal rhinitis    spring and fall  . Skin cancer    squamous cell ca chest  . Vaginal prolapse    hs corrected    Family History  Problem Relation Age of Onset  . Heart attack Mother   . Alcohol abuse Mother   . Kidney cancer Father   . Hypertension Maternal Grandmother   . Esophageal cancer Neg Hx   . Colon cancer Neg Hx   . Pancreatic cancer Neg Hx   . Stomach cancer Neg Hx     Social History   Socioeconomic History  . Marital status: Married    Spouse name: Not on file  . Number of children: Not on file  . Years of education: Not on file  . Highest education level:  Not on file  Occupational History  . Not on file  Social Needs  . Financial resource strain: Not on file  . Food insecurity    Worry: Not on file    Inability: Not on file  . Transportation needs    Medical: Not on file    Non-medical: Not on file  Tobacco Use  . Smoking status: Never Smoker  . Smokeless tobacco: Never Used  Substance and Sexual Activity  . Alcohol use: Yes    Alcohol/week: 1.0 standard drinks    Types: 1 Glasses of wine per week    Comment: occasionally  . Drug use: No  . Sexual activity: Not Currently    Comment: 1st itnercourse- 19, partners- 2, married- 58 yrs   Lifestyle  . Physical activity    Days per week: Not on file    Minutes per session: Not on file  . Stress: Not on file  Relationships  . Social Herbalist on phone: Not on file     Gets together: Not on file    Attends religious service: Not on file    Active member of club or organization: Not on file    Attends meetings of clubs or organizations: Not on file    Relationship status: Not on file  Other Topics Concern  . 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 b complex vitamins tablet Take 1 tablet by mouth daily.  Marland Kitchen CALCIUM PO Take 1,200 mg by mouth daily.   . cholecalciferol (VITAMIN D) 1000 UNITS tablet Take 1,000 Units by mouth every morning.   . denosumab (PROLIA) 60 MG/ML SOLN injection Inject 60 mg into the skin every 6 (six) months. Administer in upper arm, thigh, or abdomen  . pantoprazole (PROTONIX) 20 MG tablet TAKE 1 TABLET(20 MG) BY MOUTH DAILY  . [DISCONTINUED] levothyroxine (SYNTHROID) 88 MCG tablet TAKE 1 TABLET BY MOUTH EVERY DAY  . fluticasone (FLONASE) 50 MCG/ACT nasal spray 1 or 2 sprays each nostril twice a day (Patient not taking: Reported on 05/14/2019)  . levothyroxine (SYNTHROID) 88 MCG tablet Take 1 tablet (88 mcg total) by mouth daily.  . [DISCONTINUED] gabapentin (NEURONTIN) 100 MG capsule Take 100 mg by mouth as needed.  . [DISCONTINUED] ondansetron (ZOFRAN ODT) 4 MG disintegrating tablet Take 1 tablet (4 mg total) by mouth every 8 (eight) hours as needed. (Patient not taking: Reported on 05/14/2019)   No facility-administered encounter medications on file as of 05/14/2019.     EXAM:  BP 128/64 (BP Location: Right Arm, Patient Position: Sitting, Cuff Size: Normal)   Pulse 77   Temp 97.7 F (36.5 C) (Temporal)   Ht 5' (1.524 m)   Wt 155 lb 12.8 oz (70.7 kg)   SpO2 96%   BMI 30.43 kg/m   Body mass index is 30.43 kg/m.  Physical Exam: Vital signs reviewed [id number] is a well-developed well-nourished alert cooperative   who  appears stated age in no acute distress.  HEENT: normocephalic atraumatic , Eyes: PERRL EOM's full, conjunctiva clear,  tenderness., Ears: no deformity EAC's clear TMs with normal landmarks. Mouthmasked CHEST/PULM:  Clear to auscultation and percussion breath sounds equal no wheeze , rales  or rhonchi. No chest wall deformities or tenderness. Breast per gyne  Has kyphosis     CV: PMI is nondisplaced, S1 S2 no gallops, murmurs, rubs. Peripheral pulses are fpresent full .No JVD .  ABDOMEN: Bowel sounds normal nontender  No guard or rebound, no hepato splenomegal no CVA tenderness.   Extremtities:  No clubbing cyanosis or edema, no acute joint swelling or redness no focal atrophy NEURO:  Oriented x3, cranial nerves 3-12 appear to be intact, no obvious focal weakness,gait within normal limits no abnormal reflexes or asymmetrical SKIN: No acute rashes normal turgor, color, no bruising or petechiae.  Many sun changes on  LE   PSYCH: Oriented, good eye contact, no obvious depression anxiety, cognition and judgment appear normal. LN: no cervical axillary  adenopathy No noted deficits in memory, attention, and speech.   Lab Results  Component Value Date   WBC 5.8 05/11/2018   HGB 12.8 05/11/2018   HCT 38.0 05/11/2018   PLT 282.0 05/11/2018   GLUCOSE 95 08/27/2018   CHOL 196 05/11/2018   TRIG 93.0 05/11/2018   HDL 56.40 05/11/2018   LDLDIRECT 134.1 12/27/2006   LDLCALC 121 (H) 05/11/2018   ALT 16 08/27/2018   AST 28 08/27/2018   NA 139 08/27/2018   K 4.1 08/27/2018   CL 105 08/27/2018   CREATININE 0.83 08/27/2018   BUN 14 08/27/2018   CO2 23 08/27/2018   TSH 3.28 11/20/2018   INR 1.0 07/25/2007   Depression screen PHQ 2/9 05/14/2019 05/11/2018 09/27/2017 03/31/2017 12/18/2015  Decreased Interest 0 0 0 0 0  Down, Depressed, Hopeless 0 0 0 0 0  PHQ - 2 Score 0 0 0 0 0     ASSESSMENT AND PLAN:  Discussed the following assessment and plan:  Medication management - Plan: Basic metabolic  panel, CBC with Differential, Lipid panel, TSH, T4, Free (Thyrox), Hepatic function panel, Vitamin D 25 hydroxy  Hypothyroidism, unspecified type - Plan: Basic metabolic panel, CBC with Differential, Lipid panel, TSH, T4, Free (Thyrox), Hepatic function panel  Osteoporosis, unspecified osteoporosis type, unspecified pathological fracture presence - Plan: Basic metabolic panel, CBC with Differential, Lipid panel, TSH, T4, Free (Thyrox), Hepatic function panel, Vitamin D 25 hydroxy  Hyperlipidemia, unspecified hyperlipidemia type - Plan: Basic metabolic panel, CBC with Differential, Lipid panel, TSH, T4, Free (Thyrox), Hepatic function panel  Gastroesophageal reflux disease, unspecified whether esophagitis present - using as needed protonix  most days  Counseled. Medication   Plan yearly visit and  Refill med today  Non dialy use of ppi at this time  injury prevention   Patient Care Team: Panosh, Standley Brooking, MD as PCP - General Princess Bruins, MD as Attending Physician (Obstetrics and Gynecology) Addison Lank, MD as Referring Physician (Dermatology) [facility]x   Patient Instructions  Glad you are doing well. Will notify you  of labs when available.   Refilling synthroid today     Health Maintenance, Female Adopting a healthy lifestyle and getting preventive care are important in promoting health and wellness. Ask your health care provider about:  The right schedule for you to have regular tests and exams.  Things you can do on your own to prevent diseases and keep yourself healthy. What should I know about diet, weight, and exercise? Eat a healthy diet   Eat a diet that includes plenty of vegetables, fruits, low-fat dairy products, and lean protein.  Do not eat a lot of foods that are high in solid fats, added sugars, or sodium. Maintain  a healthy weight Body mass index (BMI) is used to identify weight problems. It estimates body fat based on height and weight. Your health care  provider can help determine your BMI and help you achieve or maintain a healthy weight. Get regular exercise Get regular exercise. This is one of the most important things you can do for your health. Most adults should:  Exercise for at least 150 minutes each week. The exercise should increase your heart rate and make you sweat (moderate-intensity exercise).  Do strengthening exercises at least twice a week. This is in addition to the moderate-intensity exercise.  Spend less time sitting. Even light physical activity can be beneficial. Watch cholesterol and blood lipids Have your blood tested for lipids and cholesterol at 77 years of age, then have this test every 5 years. Have your cholesterol levels checked more often if:  Your lipid or cholesterol levels are high.  You are older than 77 years of age.  You are at high risk for heart disease. What should I know about cancer screening? Depending on your health history and family history, you may need to have cancer screening at various ages. This may include screening for:  Breast cancer.  Cervical cancer.  Colorectal cancer.  Skin cancer.  Lung cancer. What should I know about heart disease, diabetes, and high blood pressure? Blood pressure and heart disease  High blood pressure causes heart disease and increases the risk of stroke. This is more likely to develop in people who have high blood pressure readings, are of African descent, or are overweight.  Have your blood pressure checked: ? Every 3-5 years if you are 106-7 years of age. ? Every year if you are 85 years old or older. Diabetes Have regular diabetes screenings. This checks your fasting blood sugar level. Have the screening done:  Once every three years after age 13 if you are at a normal weight and have a low risk for diabetes.  More often and at a younger age if you are overweight or have a high risk for diabetes. What should I know about preventing  infection? Hepatitis B If you have a higher risk for hepatitis B, you should be screened for this virus. Talk with your health care provider to find out if you are at risk for hepatitis B infection. Hepatitis C Testing is recommended for:  Everyone born from [birth year] through 1964.  Anyone with known risk factors for hepatitis C. Sexually transmitted infections (STIs)  Get screened for STIs, including gonorrhea and chlamydia, if: ? You are sexually active and are younger than 77 years of age. ? You are older than 77 years of age and your health care provider tells you that you are at risk for this type of infection. ? Your sexual activity has changed since you were last screened, and you are at increased risk for chlamydia or gonorrhea. Ask your health care provider if you are at risk.  Ask your health care provider about whether you are at high risk for HIV. Your health care provider may recommend a prescription medicine to help prevent HIV infection. If you choose to take medicine to prevent HIV, you should first get tested for HIV. You should then be tested every 3 months for as long as you are taking the medicine. Pregnancy  If you are about to stop having your period (premenopausal) and you may become pregnant, seek counseling before you get pregnant.  Take 400 to 800 micrograms (mcg) of folic acid  every day if you become pregnant.  Ask for birth control (contraception) if you want to prevent pregnancy. Osteoporosis and menopause Osteoporosis is a disease in which the bones lose minerals and strength with aging. This can result in bone fractures. If you are 77 years old or older, or if you are at risk for osteoporosis and fractures, ask your health care provider if you should:  Be screened for bone loss.  Take a calcium or vitamin D supplement to lower your risk of fractures.  Be given hormone replacement therapy (HRT) to treat symptoms of menopause. Follow these instructions at home:  Lifestyle  Do not use any products that contain nicotine or tobacco, such as cigarettes, e-cigarettes, and chewing tobacco. If you need help quitting, ask your health care provider.  Do not use street drugs.  Do not share needles.  Ask your health care provider for help if you need support or information about quitting drugs. Alcohol use  Do not drink alcohol if: ? Your health care provider tells you not to drink. ? You are pregnant, may be pregnant, or are planning to become pregnant.  If you drink alcohol: ? Limit how much you use to 0-1 drink a day. ? Limit intake if you are breastfeeding.  Be aware of how much alcohol is in your drink. In the [place] one drink equals one 12 oz bottle of beer (355 mL), one 5 oz glass of wine (148 mL), or one 1 oz glass of hard liquor (44 mL). General instructions  Schedule regular health, dental, and eye exams.  Stay current with your vaccines.  Tell your health care provider if: ? You often feel depressed. ? You have ever been abused or do not feel safe at home. Summary  Adopting a healthy lifestyle and getting preventive care are important in promoting health and wellness.  Follow your health care provider's instructions about healthy diet, exercising, and getting tested or screened for diseases.  Follow your health care provider's instructions on monitoring your cholesterol and blood pressure. This information is not intended to replace advice given to you by your health care provider. Make sure you discuss any questions you have with your health care provider. Document Released: 12/27/2010 Document Revised: 06/06/2018 Document Reviewed: 06/06/2018 Elsevier Patient Education  2020 [place] Panosh M.D.

## 2019-05-14 ENCOUNTER — Other Ambulatory Visit: Payer: Self-pay

## 2019-05-14 ENCOUNTER — Telehealth: Payer: Self-pay

## 2019-05-14 ENCOUNTER — Encounter: Payer: Self-pay | Admitting: Internal Medicine

## 2019-05-14 ENCOUNTER — Ambulatory Visit (INDEPENDENT_AMBULATORY_CARE_PROVIDER_SITE_OTHER): Payer: Medicare Other | Admitting: Internal Medicine

## 2019-05-14 VITALS — BP 128/64 | HR 77 | Temp 97.7°F | Ht 60.0 in | Wt 155.8 lb

## 2019-05-14 DIAGNOSIS — K219 Gastro-esophageal reflux disease without esophagitis: Secondary | ICD-10-CM

## 2019-05-14 DIAGNOSIS — E039 Hypothyroidism, unspecified: Secondary | ICD-10-CM | POA: Diagnosis not present

## 2019-05-14 DIAGNOSIS — Z79899 Other long term (current) drug therapy: Secondary | ICD-10-CM | POA: Diagnosis not present

## 2019-05-14 DIAGNOSIS — E785 Hyperlipidemia, unspecified: Secondary | ICD-10-CM | POA: Diagnosis not present

## 2019-05-14 DIAGNOSIS — M810 Age-related osteoporosis without current pathological fracture: Secondary | ICD-10-CM

## 2019-05-14 DIAGNOSIS — E673 Hypervitaminosis D: Secondary | ICD-10-CM

## 2019-05-14 DIAGNOSIS — R7989 Other specified abnormal findings of blood chemistry: Secondary | ICD-10-CM

## 2019-05-14 LAB — HEPATIC FUNCTION PANEL
ALT: 18 U/L (ref 0–35)
AST: 28 U/L (ref 0–37)
Albumin: 4.4 g/dL (ref 3.5–5.2)
Alkaline Phosphatase: 35 U/L — ABNORMAL LOW (ref 39–117)
Bilirubin, Direct: 0.2 mg/dL (ref 0.0–0.3)
Total Bilirubin: 1.2 mg/dL (ref 0.2–1.2)
Total Protein: 6.6 g/dL (ref 6.0–8.3)

## 2019-05-14 LAB — BASIC METABOLIC PANEL
BUN: 15 mg/dL (ref 6–23)
CO2: 29 mEq/L (ref 19–32)
Calcium: 9.5 mg/dL (ref 8.4–10.5)
Chloride: 106 mEq/L (ref 96–112)
Creatinine, Ser: 0.77 mg/dL (ref 0.40–1.20)
GFR: 72.54 mL/min (ref >60.00)
Glucose, Bld: 94 mg/dL (ref 70–99)
Potassium: 3.9 mEq/L (ref 3.5–5.1)
Sodium: 141 mEq/L (ref 135–145)

## 2019-05-14 LAB — CBC WITH DIFFERENTIAL/PLATELET
Basophils Absolute: 0 10*3/uL (ref 0.0–0.1)
Basophils Relative: 0.4 % (ref 0.0–3.0)
Eosinophils Absolute: 0.3 10*3/uL (ref 0.0–0.7)
Eosinophils Relative: 4.7 % (ref 0.0–5.0)
HCT: 37.6 % (ref 36.0–46.0)
Hemoglobin: 12.5 g/dL (ref 12.0–15.0)
Lymphocytes Relative: 32.7 % (ref 12.0–46.0)
Lymphs Abs: 2 10*3/uL (ref 0.7–4.0)
MCHC: 33.3 g/dL (ref 30.0–36.0)
MCV: 97.2 fl (ref 78.0–100.0)
Monocytes Absolute: 0.6 10*3/uL (ref 0.1–1.0)
Monocytes Relative: 9.2 % (ref 3.0–12.0)
Neutro Abs: 3.2 10*3/uL (ref 1.4–7.7)
Neutrophils Relative %: 53 % (ref 43.0–77.0)
Platelets: 295 10*3/uL (ref 150.0–400.0)
RBC: 3.87 Mil/uL (ref 3.87–5.11)
RDW: 14.8 % (ref 11.5–15.5)
WBC: 6.1 10*3/uL (ref 4.0–10.5)

## 2019-05-14 LAB — LIPID PANEL
Cholesterol: 201 mg/dL — ABNORMAL HIGH (ref 0–200)
HDL: 55.4 mg/dL (ref >39.00)
LDL Cholesterol: 127 mg/dL — ABNORMAL HIGH (ref 0–99)
NonHDL: 145.23
Total CHOL/HDL Ratio: 4
Triglycerides: 89 mg/dL (ref 0.0–149.0)
VLDL: 17.8 mg/dL (ref 0.0–40.0)

## 2019-05-14 LAB — TSH: TSH: 6 u[IU]/mL — ABNORMAL HIGH (ref 0.35–4.50)

## 2019-05-14 LAB — T4, FREE: Free T4: 0.92 ng/dL (ref 0.60–1.60)

## 2019-05-14 LAB — VITAMIN D 25 HYDROXY (VIT D DEFICIENCY, FRACTURES): VITD: 101.79 ng/mL (ref 30.00–100.00)

## 2019-05-14 MED ORDER — LEVOTHYROXINE SODIUM 88 MCG PO TABS: 88.0000 ug | ORAL_TABLET | Freq: Every day | ORAL | 3 refills | Status: DC

## 2019-05-14 NOTE — Telephone Encounter (Signed)
Please see my result note advise already done. and call patient as directed

## 2019-05-14 NOTE — Telephone Encounter (Signed)
Critical lab  Vitamin D high at 109.9

## 2019-05-14 NOTE — Patient Instructions (Addendum)
Glad you are doing well. Will notify you  of labs when available.   Refilling synthroid today     Health Maintenance, Female Adopting a healthy lifestyle and getting preventive care are important in promoting health and wellness. Ask your health care provider about:  The right schedule for you to have regular tests and exams.  Things you can do on your own to prevent diseases and keep yourself healthy. What should I know about diet, weight, and exercise? Eat a healthy diet   Eat a diet that includes plenty of vegetables, fruits, low-fat dairy products, and lean protein.  Do not eat a lot of foods that are high in solid fats, added sugars, or sodium. Maintain a healthy weight Body mass index (BMI) is used to identify weight problems. It estimates body fat based on height and weight. Your health care provider can help determine your BMI and help you achieve or maintain a healthy weight. Get regular exercise Get regular exercise. This is one of the most important things you can do for your health. Most adults should:  Exercise for at least 150 minutes each week. The exercise should increase your heart rate and make you sweat (moderate-intensity exercise).  Do strengthening exercises at least twice a week. This is in addition to the moderate-intensity exercise.  Spend less time sitting. Even light physical activity can be beneficial. Watch cholesterol and blood lipids Have your blood tested for lipids and cholesterol at 77 years of age, then have this test every 5 years. Have your cholesterol levels checked more often if:  Your lipid or cholesterol levels are high.  You are older than 77 years of age.  You are at high risk for heart disease. What should I know about cancer screening? Depending on your health history and family history, you may need to have cancer screening at various ages. This may include screening for:  Breast cancer.  Cervical cancer.  Colorectal  cancer.  Skin cancer.  Lung cancer. What should I know about heart disease, diabetes, and high blood pressure? Blood pressure and heart disease  High blood pressure causes heart disease and increases the risk of stroke. This is more likely to develop in people who have high blood pressure readings, are of African descent, or are overweight.  Have your blood pressure checked: ? Every 3-5 years if you are 77-77 years of age. ? Every year if you are 77 years old or older. Diabetes Have regular diabetes screenings. This checks your fasting blood sugar level. Have the screening done:  Once every three years after age 2 if you are at a normal weight and have a low risk for diabetes.  More often and at a younger age if you are overweight or have a high risk for diabetes. What should I know about preventing infection? Hepatitis B If you have a higher risk for hepatitis B, you should be screened for this virus. Talk with your health care provider to find out if you are at risk for hepatitis B infection. Hepatitis C Testing is recommended for:  Everyone born from [birth year] through 1964.  Anyone with known risk factors for hepatitis C. Sexually transmitted infections (STIs)  Get screened for STIs, including gonorrhea and chlamydia, if: ? You are sexually active and are younger than 77 years of age. ? You are older than 77 years of age and your health care provider tells you that you are at risk for this type of infection. ? Your sexual activity has  changed since you were last screened, and you are at increased risk for chlamydia or gonorrhea. Ask your health care provider if you are at risk.  Ask your health care provider about whether you are at high risk for HIV. Your health care provider may recommend a prescription medicine to help prevent HIV infection. If you choose to take medicine to prevent HIV, you should first get tested for HIV. You should then be tested every 3 months for as long as  you are taking the medicine. Pregnancy  If you are about to stop having your period (premenopausal) and you may become pregnant, seek counseling before you get pregnant.  Take 400 to 800 micrograms (mcg) of folic acid every day if you become pregnant.  Ask for birth control (contraception) if you want to prevent pregnancy. Osteoporosis and menopause Osteoporosis is a disease in which the bones lose minerals and strength with aging. This can result in bone fractures. If you are 52 years old or older, or if you are at risk for osteoporosis and fractures, ask your health care provider if you should:  Be screened for bone loss.  Take a calcium or vitamin D supplement to lower your risk of fractures.  Be given hormone replacement therapy (HRT) to treat symptoms of menopause. Follow these instructions at home: Lifestyle  Do not use any products that contain nicotine or tobacco, such as cigarettes, e-cigarettes, and chewing tobacco. If you need help quitting, ask your health care provider.  Do not use street drugs.  Do not share needles.  Ask your health care provider for help if you need support or information about quitting drugs. Alcohol use  Do not drink alcohol if: ? Your health care provider tells you not to drink. ? You are pregnant, may be pregnant, or are planning to become pregnant.  If you drink alcohol: ? Limit how much you use to 0-1 drink a day. ? Limit intake if you are breastfeeding.  Be aware of how much alcohol is in your drink. In the [place] one drink equals one 12 oz bottle of beer (355 mL), one 5 oz glass of wine (148 mL), or one 1 oz glass of hard liquor (44 mL). General instructions  Schedule regular health, dental, and eye exams.  Stay current with your vaccines.  Tell your health care provider if: ? You often feel depressed. ? You have ever been abused or do not feel safe at home. Summary  Adopting a healthy lifestyle and getting preventive care are  important in promoting health and wellness.  Follow your health care provider's instructions about healthy diet, exercising, and getting tested or screened for diseases.  Follow your health care provider's instructions on monitoring your cholesterol and blood pressure. This information is not intended to replace advice given to you by your health care provider. Make sure you discuss any questions you have with your health care provider. Document Released: 12/27/2010 Document Revised: 06/06/2018 Document Reviewed: 06/06/2018 Elsevier Patient Education  2020 [employer].

## 2019-06-08 ENCOUNTER — Other Ambulatory Visit: Payer: Self-pay | Admitting: Internal Medicine

## 2019-06-12 ENCOUNTER — Encounter: Payer: Self-pay | Admitting: Anesthesiology

## 2019-07-03 ENCOUNTER — Other Ambulatory Visit: Payer: Self-pay

## 2019-07-03 ENCOUNTER — Other Ambulatory Visit (INDEPENDENT_AMBULATORY_CARE_PROVIDER_SITE_OTHER): Payer: Medicare Other

## 2019-07-03 DIAGNOSIS — M810 Age-related osteoporosis without current pathological fracture: Secondary | ICD-10-CM | POA: Diagnosis not present

## 2019-07-03 DIAGNOSIS — E039 Hypothyroidism, unspecified: Secondary | ICD-10-CM

## 2019-07-03 LAB — VITAMIN D 25 HYDROXY (VIT D DEFICIENCY, FRACTURES): VITD: 77.65 ng/mL (ref 30.00–100.00)

## 2019-07-03 LAB — TSH: TSH: 13.84 u[IU]/mL — ABNORMAL HIGH (ref 0.35–4.50)

## 2019-07-04 NOTE — Progress Notes (Signed)
Thyroid is now  off even more   vit d is good . Plan to go up on thyroid dosing ( unless you have missed a number of doses)    increase to synthroid 100 mcg per day  disp 90 refill x 1   then plan another  tsh in 3 mos  and  then fu VV? If still not in range   Madison please  send in new dose of medication.

## 2019-07-05 ENCOUNTER — Other Ambulatory Visit: Payer: Self-pay

## 2019-07-05 DIAGNOSIS — E039 Hypothyroidism, unspecified: Secondary | ICD-10-CM

## 2019-07-05 MED ORDER — LEVOTHYROXINE SODIUM 100 MCG PO TABS: 100.0000 ug | ORAL_TABLET | Freq: Every day | ORAL | 1 refills | Status: DC

## 2019-07-12 DIAGNOSIS — Z23 Encounter for immunization: Secondary | ICD-10-CM | POA: Diagnosis not present

## 2019-07-15 NOTE — Telephone Encounter (Signed)
Some people stop testing around 53 but since you are healthy  I advise we reorder the colo guard test for you for colon cancer screening  Madison please place order  If she agrees

## 2019-07-15 NOTE — Telephone Encounter (Signed)
PROLIA GIVEN 04/17/2019 NEXT INJECTION 10/17/2019

## 2019-07-20 ENCOUNTER — Other Ambulatory Visit: Payer: Self-pay | Admitting: Internal Medicine

## 2019-08-02 ENCOUNTER — Other Ambulatory Visit: Payer: Self-pay | Admitting: *Deleted

## 2019-08-02 DIAGNOSIS — Z1211 Encounter for screening for malignant neoplasm of colon: Secondary | ICD-10-CM

## 2019-08-03 DIAGNOSIS — Z23 Encounter for immunization: Secondary | ICD-10-CM | POA: Diagnosis not present

## 2019-08-12 ENCOUNTER — Encounter: Payer: Self-pay | Admitting: Obstetrics & Gynecology

## 2019-08-12 DIAGNOSIS — Z1231 Encounter for screening mammogram for malignant neoplasm of breast: Secondary | ICD-10-CM | POA: Diagnosis not present

## 2019-08-12 LAB — HM MAMMOGRAPHY

## 2019-08-14 DIAGNOSIS — H35363 Drusen (degenerative) of macula, bilateral: Secondary | ICD-10-CM | POA: Diagnosis not present

## 2019-08-14 DIAGNOSIS — H2513 Age-related nuclear cataract, bilateral: Secondary | ICD-10-CM | POA: Diagnosis not present

## 2019-08-14 DIAGNOSIS — D3132 Benign neoplasm of left choroid: Secondary | ICD-10-CM | POA: Diagnosis not present

## 2019-08-18 DIAGNOSIS — Z1211 Encounter for screening for malignant neoplasm of colon: Secondary | ICD-10-CM | POA: Diagnosis not present

## 2019-08-22 LAB — COLOGUARD: Cologuard: NEGATIVE

## 2019-08-30 ENCOUNTER — Encounter: Payer: Self-pay | Admitting: Internal Medicine

## 2019-09-26 ENCOUNTER — Other Ambulatory Visit: Payer: Self-pay

## 2019-09-30 ENCOUNTER — Other Ambulatory Visit: Payer: Self-pay

## 2019-09-30 ENCOUNTER — Other Ambulatory Visit (INDEPENDENT_AMBULATORY_CARE_PROVIDER_SITE_OTHER): Payer: Medicare Other

## 2019-09-30 DIAGNOSIS — E039 Hypothyroidism, unspecified: Secondary | ICD-10-CM

## 2019-09-30 LAB — TSH: TSH: 3.38 u[IU]/mL (ref 0.35–4.50)

## 2019-09-30 NOTE — Progress Notes (Signed)
Now normal  thyroid level  Also see if she can change her updoming appt  on April 8 to 10 30 slot   I have appt that am that may go over

## 2019-10-01 ENCOUNTER — Telehealth: Payer: Self-pay

## 2019-10-01 NOTE — Telephone Encounter (Signed)
Called patient and left a voice message for her to call back to change her appointment time per Dr. Regis Bill.  Per Dr. Regis Bill: Also see if she can change her updoming appt on April 8 to 10 30 slot

## 2019-10-03 ENCOUNTER — Encounter: Payer: Self-pay | Admitting: Internal Medicine

## 2019-10-03 ENCOUNTER — Telehealth: Payer: Medicare Other | Admitting: Internal Medicine

## 2019-10-03 ENCOUNTER — Telehealth (INDEPENDENT_AMBULATORY_CARE_PROVIDER_SITE_OTHER): Payer: Medicare Other | Admitting: Internal Medicine

## 2019-10-03 ENCOUNTER — Other Ambulatory Visit: Payer: Self-pay

## 2019-10-03 VITALS — BP 125/74 | HR 83 | Temp 96.6°F | Ht 60.0 in | Wt 153.0 lb

## 2019-10-03 DIAGNOSIS — Z79899 Other long term (current) drug therapy: Secondary | ICD-10-CM

## 2019-10-03 DIAGNOSIS — R198 Other specified symptoms and signs involving the digestive system and abdomen: Secondary | ICD-10-CM

## 2019-10-03 DIAGNOSIS — E039 Hypothyroidism, unspecified: Secondary | ICD-10-CM | POA: Diagnosis not present

## 2019-10-03 MED ORDER — LEVOTHYROXINE SODIUM 100 MCG PO TABS: 100.0000 ug | ORAL_TABLET | Freq: Every day | ORAL | 2 refills | Status: DC

## 2019-10-03 NOTE — Progress Notes (Signed)
Virtual Visit via Video Note  I connected with@ on 10/03/19 at 10:30 AM EDT by a video enabled telemedicine application and verified that I am speaking with the correct person using two identifiers. Location patient: home Location provider: home office Persons participating in the virtual visit: patient, provider  [place] national recommendations  regarding COVID 19 pandemic   video visit is advised over in office visit for this patient.  Patient aware  of the limitations of evaluation and management by telemedicine and  availability of in person appointments. and agreed to proceed.   HPI: [name] [name] presents for video visit  Fu thyroid adjustment  tsh was up and  increase dose ot 100  Not sure if had a brqnd generic change .  She does however take med with others in am protonix and    Vits.    Thinks she has  Lactose intolerance  With mild and ice cream gets  Go sx   But feels better than good at this time  Health wise .    ROS: See pertinent positives and negatives per HPI.  Past Medical History:  Diagnosis Date  . Dog bite of thigh 04/09/2012  . Elevated lipids   . GERD (gastroesophageal reflux disease)   . Hypothyroidism    post thyroidectomy  . Osteopenia    t -2.5 hip in 2005 by Dexa  . Osteoporosis    commpr fx after dog related fall  injury  . Seasonal rhinitis    spring and fall  . Skin cancer    squamous cell ca chest  . Vaginal prolapse    hs corrected    Past Surgical History:  Procedure Laterality Date  . ABDOMINAL HYSTERECTOMY  07/2007  . bladders sling surgery  07/2007  . COLONOSCOPY  2006  . THYROIDECTOMY      Family History  Problem Relation Age of Onset  . Heart attack Mother   . Alcohol abuse Mother   . Kidney cancer Father   . Hypertension Maternal Grandmother   . Esophageal cancer Neg Hx   . Colon cancer Neg Hx   . Pancreatic cancer Neg Hx   . Stomach cancer Neg Hx     Social History   Tobacco Use  . Smoking status: Never Smoker  .  Smokeless tobacco: Never Used  Substance Use Topics  . Alcohol use: Yes    Alcohol/week: 1.0 standard drinks    Types: 1 Glasses of wine per week    Comment: occasionally  . Drug use: No      Current Outpatient Medications:  .  b complex vitamins tablet, Take 1 tablet by mouth daily., Disp: , Rfl:  .  CALCIUM PO, Take 1,200 mg by mouth daily. , Disp: , Rfl:  .  cetirizine (ZYRTEC) 10 MG tablet, Take 10 mg by mouth as needed for allergies., Disp: , Rfl:  .  cholecalciferol (VITAMIN D) 1000 UNITS tablet, Take 1,000 Units by mouth every morning. , Disp: , Rfl:  .  denosumab (PROLIA) 60 MG/ML SOLN injection, Inject 60 mg into the skin every 6 (six) months. Administer in upper arm, thigh, or abdomen, Disp: , Rfl:  .  fluticasone (FLONASE) 50 MCG/ACT nasal spray, 1 or 2 sprays each nostril twice a day (Patient taking differently: 1 or 2 sprays each nostril as needed), Disp: 16 g, Rfl: 0 .  levothyroxine (SYNTHROID) 100 MCG tablet, Take 1 tablet (100 mcg total) by mouth daily., Disp: 90 tablet, Rfl: 2 .  pantoprazole (PROTONIX)  20 MG tablet, TAKE 1 TABLET(20 MG) BY MOUTH DAILY, Disp: 90 tablet, Rfl: 0  EXAM: BP Readings from Last 3 Encounters:  10/03/19 125/74  05/14/19 128/64  02/21/19 138/90    VITALS per patient if applicable:  GENERAL: alert, oriented, appears well and in no acute distress  HEENT: atraumatic, conjunttiva clear, no obvious abnormalities on inspection of external nose and ears  NECK: normal movements of the head and neck  LUNGS: on inspection no signs of respiratory distress, breathing rate appears normal, no obvious gross SOB, gasping or wheezing  CV: no obvious cyanosis  MS: moves all visible extremities without noticeable abnormality  PSYCH/NEURO: pleasant and cooperative, no obvious depression or anxiety, speech and thought processing grossly intact Lab Results  Component Value Date   WBC 6.1 05/14/2019   HGB 12.5 05/14/2019   HCT 37.6 05/14/2019   PLT  295.0 05/14/2019   GLUCOSE 94 05/14/2019   CHOL 201 (H) 05/14/2019   TRIG 89.0 05/14/2019   HDL 55.40 05/14/2019   LDLDIRECT 134.1 12/27/2006   LDLCALC 127 (H) 05/14/2019   ALT 18 05/14/2019   AST 28 05/14/2019   NA 141 05/14/2019   K 3.9 05/14/2019   CL 106 05/14/2019   CREATININE 0.77 05/14/2019   BUN 15 05/14/2019   CO2 29 05/14/2019   TSH 3.38 09/30/2019   INR 1.0 07/25/2007    ASSESSMENT AND PLAN:  Discussed the following assessment and plan:    ICD-10-CM   1. Hypothyroidism, unspecified type  E03.9   2. Medication management  Z79.899   3. GI symptom  R19.8    poss lactose intolerant no alarm sx see text   Disc how to take synthroig stay on the 100 mcg at this time  And plan repeat at her yearly exam and labs in November or as needed Cant ry lactoe elimination   Or lactaid etc   But get with [place] if  persistent or progressive sx or alarming sx  Over all she is feeling well.  Counseled.   Expectant management and discussion of plan and treatment with opportunity to ask questions and all were answered. The patient agreed with the plan and demonstrated an understanding of the instructions.   Advised to call back or seek an in-person evaluation if worsening  or having  further concerns .in interim  Return in about 7 months (around 05/13/2020) for yearly visit //cpx and medications labs .  Shanon Ace, MD

## 2019-10-10 ENCOUNTER — Telehealth: Payer: Self-pay | Admitting: *Deleted

## 2019-10-10 NOTE — Telephone Encounter (Signed)
Deductible $203 ($88.87met)  OOP MAX N/A  Annual exam 02/21/2019 ML  Calcium 9.5            Date 04/28/2019  Upcoming dental procedures   Prior Authorization needed   Pt estimated Cost $0   APPT 10/17/2019    Coverage Details: This is a [employer] F Plan and it covers the medicare Part B deductible and 100% of the excess charges

## 2019-10-15 ENCOUNTER — Telehealth: Payer: Self-pay | Admitting: Internal Medicine

## 2019-10-15 DIAGNOSIS — M810 Age-related osteoporosis without current pathological fracture: Secondary | ICD-10-CM

## 2019-10-15 DIAGNOSIS — E039 Hypothyroidism, unspecified: Secondary | ICD-10-CM

## 2019-10-15 NOTE — Chronic Care Management (AMB) (Signed)
  Chronic Care Management   Note  10/15/2019 Name: [name] [name] MRN: [id number] DOB: 07/30/1940  [name] [name] is a 79 y.o. year old female who is a primary care patient of Panosh, Standley Brooking, MD. I reached out to [name] [name] by phone today in response to a referral sent by Ms. [name] [name]'s PCP, Panosh, Standley Brooking, MD.   [name] [name] was given information about Chronic Care Management services today including:  1. CCM service includes personalized support from designated clinical staff supervised by her physician, including individualized plan of care and coordination with other care providers 2. 24/7 contact phone numbers for assistance for urgent and routine care needs. 3. Service will only be billed when office clinical staff spend 20 minutes or more in a month to coordinate care. 4. Only one practitioner may furnish and bill the service in a calendar month. 5. The patient may stop CCM services at any time (effective at the end of the month) by phone call to the office staff.   Patient agreed to services and verbal consent obtained.   Follow up plan:   [name] [name] [employer] Scheduler

## 2019-10-17 ENCOUNTER — Ambulatory Visit (INDEPENDENT_AMBULATORY_CARE_PROVIDER_SITE_OTHER): Payer: Medicare Other | Admitting: Anesthesiology

## 2019-10-17 ENCOUNTER — Other Ambulatory Visit: Payer: Self-pay

## 2019-10-17 DIAGNOSIS — M810 Age-related osteoporosis without current pathological fracture: Secondary | ICD-10-CM

## 2019-10-17 MED ORDER — DENOSUMAB 60 MG/ML ~~LOC~~ SOSY
60.0000 mg | PREFILLED_SYRINGE | Freq: Once | SUBCUTANEOUS | Status: AC
  Administered 2019-10-17: 60 mg via SUBCUTANEOUS

## 2019-11-01 ENCOUNTER — Other Ambulatory Visit: Payer: Self-pay | Admitting: Internal Medicine

## 2019-11-08 NOTE — Telephone Encounter (Signed)
Done

## 2019-11-08 NOTE — Addendum Note (Signed)
Addended by: Gwenyth Ober R on: 11/08/2019 03:34 PM   Modules accepted: Orders

## 2019-11-14 ENCOUNTER — Telehealth: Payer: Medicare Other

## 2019-11-14 ENCOUNTER — Ambulatory Visit: Payer: Medicare Other

## 2019-11-14 ENCOUNTER — Other Ambulatory Visit: Payer: Self-pay

## 2019-11-14 DIAGNOSIS — E039 Hypothyroidism, unspecified: Secondary | ICD-10-CM

## 2019-11-14 DIAGNOSIS — M810 Age-related osteoporosis without current pathological fracture: Secondary | ICD-10-CM

## 2019-11-14 DIAGNOSIS — K219 Gastro-esophageal reflux disease without esophagitis: Secondary | ICD-10-CM

## 2019-11-14 NOTE — Chronic Care Management (AMB) (Signed)
Chronic Care Management Pharmacy  Name: [name] [name]  MRN: [id number] DOB: March 24, 1941  Initial Questions: 1. Have you seen any other providers since your last visit? NA 2. Any changes in your medicines or health? No   Chief Complaint/ HPI  [name] [name],  79 y.o. , female presents for their Initial CCM visit with the clinical pharmacist via telephone due to COVID-19 Pandemic.  PCP : Burnis Medin, MD  Their chronic conditions include: hypothyroidism, osteoporosis, GERD, allergic rhinitis  Office Visits: 09/30/2019- Shanon Ace, MD- Patient presented for video visit for thyroid follow up. TSH was elevated and dose was increased to 134mcg. Discussed how to take Synthroid. Plan for repeat at yearly exam and labs in November. Patient to return in 7 months for yearly visit and labs.   Medications: Outpatient Encounter Medications as of 11/14/2019  Medication Sig  . b complex vitamins tablet Take 1 tablet by mouth daily.  Marland Kitchen CALCIUM PO Take 1,200 mg by mouth daily.   . cetirizine (ZYRTEC) 10 MG tablet Take 10 mg by mouth as needed for allergies.  . cholecalciferol (VITAMIN D) 1000 UNITS tablet Take 1,000 Units by mouth every morning.   . denosumab (PROLIA) 60 MG/ML SOLN injection Inject 60 mg into the skin every 6 (six) months. Administer in upper arm, thigh, or abdomen  . levothyroxine (SYNTHROID) 100 MCG tablet Take 1 tablet (100 mcg total) by mouth daily.  . pantoprazole (PROTONIX) 20 MG tablet TAKE 1 TABLET(20 MG) BY MOUTH DAILY  . fluticasone (FLONASE) 50 MCG/ACT nasal spray 1 or 2 sprays each nostril twice a day (Patient not taking: Reported on 11/14/2019)   No facility-administered encounter medications on file as of 11/14/2019.     Current Diagnosis/Assessment:  Goals Addressed            This Visit's Progress   . Pharmacy Care Plan       CARE PLAN ENTRY  Current Barriers:  . Chronic Disease Management support, education, and care coordination needs related to  GERD, Hypothyroidism, and Osteoporosis   Hypothyroidism . Pharmacist Clinical Goal(s) o Over the next 180 days, patient will work with PharmD and providers to maintain TSH 0.35 to 4.50 uIU/mL  . Current regimen:  o Levothyroxine 169mcg, 1 tablet once daily  . Interventions: o We discussed:  importance of taking levothyroxine consistently everyday in regard to food and other medications (recommended to take on an empty stomach at least 30 minutes before first meal) . Patient self care activities - Over the next 180 days, patient will: o Continue current medications.   Osteoporosis . Pharmacist Clinical Goal(s) o Over the next 180 days, patient will work with PharmD and providers to prevent bone fractures.  . Current regimen:   Prolia 41mcg/ ml injection, inject 60mg  every six months   Calcium, 1200mg  once daily  Vitamin D 1000 units, 1 tablet once daily  . Interventions: o We discussed:  Recommend [phone] units of vitamin D daily. Recommend 1200 mg of calcium daily from dietary and supplemental sources. Recommend weight-bearing and muscle strengthening exercises for building and maintaining bone density. . Patient self care activities o Patient will continue current medications.   GERD . Pharmacist Clinical Goal(s) o Over the next 180 days, patient will work with PharmD and providers to Minimize reflux symptoms.  . Current regimen:  o Pantoprazole 20mg , 1 tablet once daily  . Interventions: . Discussed non-pharmacological interventions for acid reflux. Take measures to prevent acid reflux, such as avoiding spicy foods, avoiding caffeine,  avoid laying down a few hours after eating, and raising the head of the bed. . Patient self care activities o Patient will continue current medications and avoid trigger foods.    Medication management . Pharmacist Clinical Goal(s): o Over the next 180 days, patient will work with PharmD and providers to maintain optimal medication  adherence . Current pharmacy: [facility] . Interventions o Comprehensive medication review performed. o Continue current medication management strategy . Patient self care activities - Over the next 180 days, patient will: o Take medications as prescribed o Report any questions or concerns to PharmD and/or provider(s)  Initial goal documentation       SDOH Interventions     Most Recent Value  SDOH Interventions  Financial Strain Interventions  Intervention Not Indicated  Transportation Interventions  Intervention Not Indicated       Hypothyroidism  Patient takes in the morning. Sometimes by itself or other pills with it (vitamins).   TSH  Date Value Ref Range Status  09/30/2019 3.38 0.35 - 4.50 uIU/mL Final   Patient is currently controlled on the following medications:   Levothyroxine 142mcg, 1 tablet once daily   We discussed:  imortance of taking levothyroxine consistently everyday in regard to food and other medications (recommended to take on an empty stomach at least 30 minutes before first meal).  Plan Continue current medications  Osteoporosis   Last DEXA Scan: 04/20/2018  T-Score femoral neck: Left: -2.60 Right: -2.60  T-Score total femur: left: -2.40 Right: -2.20  VITD  Date Value Ref Range Status  07/03/2019 77.65 30.00 - 100.00 ng/mL Final    Patient is a candidate for pharmacologic treatment due to T-Score < -2.5 in femoral neck  Patient is currently on the following medications:   Prolia 73mcg/ ml injection, inject 60mg  every six months   Calcium, 1200mg  once daily  Vitamin D 1000 units, 1 tablet once daily   We discussed:  Recommend [phone] units of vitamin D daily. Recommend 1200 mg of calcium daily from dietary and supplemental sources. Recommend weight-bearing and muscle strengthening exercises for building and maintaining bone density.  Plan Continue current medications  GERD   Notes trigger food and has the occasional acid reflux.    Patient is currently controlled on the following medications:   Pantoprazole 20mg , 1 tablet once daily   Plan Continue current medications  Allergic rhinitis   Patient is currently controlled on the following medications:   Cetirizine 10mg ,1 tablet as needed for allergies   Plan Continue current medications   OTC/ supplements    B complex vitamin, 1 tablet once daily  Medication Management  Patient organizes medications: organized in pill box.   Primary pharmacy: [facility]  Adherence: no gaps in refill history (per medicaiton dispense hsitory from 05/18/19 to 11/14/19)    Follow up Follow up visit with PharmD in 6 months.   Anson Crofts, PharmD Clinical Pharmacist [facility] [place] [phone]

## 2019-11-25 NOTE — Patient Instructions (Addendum)
Visit Information  Goals Addressed            This Visit's Progress   . Pharmacy Care Plan       CARE PLAN ENTRY  Current Barriers:  . Chronic Disease Management support, education, and care coordination needs related to GERD, Hypothyroidism, and Osteoporosis   Hypothyroidism . Pharmacist Clinical Goal(s) o Over the next 180 days, patient will work with PharmD and providers to maintain TSH 0.35 to 4.50 uIU/mL  . Current regimen:  o Levothyroxine 121mcg, 1 tablet once daily  . Interventions: o We discussed:  importance of taking levothyroxine consistently everyday in regard to food and other medications (recommended to take on an empty stomach at least 30 minutes before first meal) . Patient self care activities - Over the next 180 days, patient will: o Continue current medications.   Osteoporosis . Pharmacist Clinical Goal(s) o Over the next 180 days, patient will work with PharmD and providers to prevent bone fractures.  . Current regimen:   Prolia 54mcg/ ml injection, inject 60mg  every six months   Calcium, 1200mg  once daily  Vitamin D 1000 units, 1 tablet once daily  . Interventions: o We discussed:  Recommend [phone] units of vitamin D daily. Recommend 1200 mg of calcium daily from dietary and supplemental sources. Recommend weight-bearing and muscle strengthening exercises for building and maintaining bone density. . Patient self care activities o Patient will continue current medications.   GERD . Pharmacist Clinical Goal(s) o Over the next 180 days, patient will work with PharmD and providers to Minimize reflux symptoms.  . Current regimen:  o Pantoprazole 20mg , 1 tablet once daily  . Interventions: . Discussed non-pharmacological interventions for acid reflux. Take measures to prevent acid reflux, such as avoiding spicy foods, avoiding caffeine, avoid laying down a few hours after eating, and raising the head of the bed. . Patient self care activities o Patient  will continue current medications and avoid trigger foods.    Medication management . Pharmacist Clinical Goal(s): o Over the next 180 days, patient will work with PharmD and providers to maintain optimal medication adherence . Current pharmacy: [facility] . Interventions o Comprehensive medication review performed. o Continue current medication management strategy . Patient self care activities - Over the next 180 days, patient will: o Take medications as prescribed o Report any questions or concerns to PharmD and/or provider(s)  Initial goal documentation        Ms. [name] [name] was given information about Chronic Care Management services today including:  1. CCM service includes personalized support from designated clinical staff supervised by her physician, including individualized plan of care and coordination with other care providers 2. 24/7 contact phone numbers for assistance for urgent and routine care needs. 3. Standard insurance, coinsurance, copays and deductibles apply for chronic care management only during months in which we provide at least 20 minutes of these services. Most insurances cover these services at 100%, however patients may be responsible for any copay, coinsurance and/or deductible if applicable. This service may help you avoid the need for more expensive face-to-face services. 4. Only one practitioner may furnish and bill the service in a calendar month. 5. The patient may stop CCM services at any time (effective at the end of the month) by phone call to the office staff.  Patient agreed to services and verbal consent obtained.   The patient verbalized understanding of instructions provided today and agreed to receive a mailed copy of patient instruction and/or educational materials. Telephone  follow up appointment with pharmacy team member scheduled for: 04/10/2020  Anson Crofts, PharmD Clinical Pharmacist [facility] [place] [phone]   Food Choices for Gastroesophageal Reflux Disease, Adult When you have gastroesophageal reflux disease (GERD), the foods you eat and your eating habits are very important. Choosing the right foods can help ease your discomfort. Think about working with a nutrition specialist (dietitian) to help you make good choices. What are tips for following this plan?  Meals  Choose healthy foods that are low in fat, such as fruits, vegetables, whole grains, low-fat dairy products, and lean meat, fish, and poultry.  Eat small meals often instead of 3 large meals a day. Eat your meals slowly, and in a place where you are relaxed. Avoid bending over or lying down until 2-3 hours after eating.  Avoid eating meals 2-3 hours before bed.  Avoid drinking a lot of liquid with meals.  Cook foods using methods other than frying. Bake, grill, or broil food instead.  Avoid or limit: ? Chocolate. ? Peppermint or spearmint. ? Alcohol. ? Pepper. ? Black and decaffeinated coffee. ? Black and decaffeinated tea. ? Bubbly (carbonated) soft drinks. ? Caffeinated energy drinks and soft drinks.  Limit high-fat foods such as: ? Fatty meat or fried foods. ? Whole milk, cream, butter, or ice cream. ? Nuts and nut butters. ? Pastries, donuts, and sweets made with butter or shortening.  Avoid foods that cause symptoms. These foods may be different for everyone. Common foods that cause symptoms include: ? Tomatoes. ? Oranges, lemons, and limes. ? Peppers. ? Spicy food. ? Onions and garlic. ? Vinegar. Lifestyle  Maintain a healthy weight. Ask your doctor what weight is healthy for you. If you need to lose weight, work with your doctor to do so safely.  Exercise for at least 30 minutes for 5 or more days each week, or as told by your doctor.  Wear loose-fitting clothes.  Do not smoke. If you need help quitting, ask your doctor.  Sleep with the head of your bed higher than your feet. Use a wedge  under the mattress or blocks under the bed frame to raise the head of the bed. Summary  When you have gastroesophageal reflux disease (GERD), food and lifestyle choices are very important in easing your symptoms.  Eat small meals often instead of 3 large meals a day. Eat your meals slowly, and in a place where you are relaxed.  Limit high-fat foods such as fatty meat or fried foods.  Avoid bending over or lying down until 2-3 hours after eating.  Avoid peppermint and spearmint, caffeine, alcohol, and chocolate. This information is not intended to replace advice given to you by your health care provider. Make sure you discuss any questions you have with your health care provider. Document Revised: 10/04/2018 Document Reviewed: 07/19/2016 Elsevier Patient Education  [place].

## 2020-01-15 ENCOUNTER — Encounter: Payer: Self-pay | Admitting: Anesthesiology

## 2020-02-02 ENCOUNTER — Other Ambulatory Visit: Payer: Self-pay | Admitting: Internal Medicine

## 2020-02-14 NOTE — Telephone Encounter (Signed)
PROLIA GIVEN 10/17/2019 NEXT INJECTION 04/18/2020

## 2020-02-21 ENCOUNTER — Encounter: Payer: Medicare Other | Admitting: Obstetrics & Gynecology

## 2020-02-28 ENCOUNTER — Encounter: Payer: Self-pay | Admitting: Obstetrics & Gynecology

## 2020-02-28 ENCOUNTER — Ambulatory Visit (INDEPENDENT_AMBULATORY_CARE_PROVIDER_SITE_OTHER): Payer: Medicare Other | Admitting: Obstetrics & Gynecology

## 2020-02-28 ENCOUNTER — Other Ambulatory Visit: Payer: Self-pay

## 2020-02-28 VITALS — BP 136/84 | Ht 59.75 in | Wt 148.6 lb

## 2020-02-28 DIAGNOSIS — Z780 Asymptomatic menopausal state: Secondary | ICD-10-CM

## 2020-02-28 DIAGNOSIS — Z90711 Acquired absence of uterus with remaining cervical stump: Secondary | ICD-10-CM | POA: Diagnosis not present

## 2020-02-28 DIAGNOSIS — M810 Age-related osteoporosis without current pathological fracture: Secondary | ICD-10-CM | POA: Diagnosis not present

## 2020-02-28 DIAGNOSIS — Z01419 Encounter for gynecological examination (general) (routine) without abnormal findings: Secondary | ICD-10-CM

## 2020-02-28 NOTE — Progress Notes (Signed)
[name] [name] 1940/09/29 [id number]   History:    79 y.o. G1P1L1 Widowed, husband passed away 2 weeks ago.  [id number] presenting for annual gyn exam   HPI:H/O Supracervical Hysterectomy/BSO with Sacrocolpopexy and Sling Procedure in 07/2007. Menopause, well on no HRT x last year. No pelvic pain. Abstinent. No stress urinary incontinence. Bowel movements normal. Breasts normal. BMI 29.26. Very active, walking/doing stairs every day.On Prolia for osteoporosis. Health labs with family physician.  Cologard last year.  Past medical history,surgical history, family history and social history were all reviewed and documented in the EPIC chart.  Gynecologic History No LMP recorded. Patient has had a hysterectomy.  Obstetric History OB History  Gravida Para Term Preterm AB Living  1 1       1   SAB TAB Ectopic Multiple Live Births               # Outcome Date GA Lbr Len/2nd Weight Sex Delivery Anes PTL Lv  1 Para              ROS: A ROS was performed and pertinent positives and negatives are included in the history.  GENERAL: No fevers or chills. HEENT: No change in vision, no earache, sore throat or sinus congestion. NECK: No pain or stiffness. CARDIOVASCULAR: No chest pain or pressure. No palpitations. PULMONARY: No shortness of breath, cough or wheeze. GASTROINTESTINAL: No abdominal pain, nausea, vomiting or diarrhea, melena or bright red blood per rectum. GENITOURINARY: No urinary frequency, urgency, hesitancy or dysuria. MUSCULOSKELETAL: No joint or muscle pain, no back pain, no recent trauma. DERMATOLOGIC: No rash, no itching, no lesions. ENDOCRINE: No polyuria, polydipsia, no heat or cold intolerance. No recent change in weight. HEMATOLOGICAL: No anemia or easy bruising or bleeding. NEUROLOGIC: No headache, seizures, numbness, tingling or weakness. PSYCHIATRIC: No depression, no loss of interest in normal activity or change in sleep pattern.      Exam:   BP 136/84   Ht 4' 11.75" (1.518 m)   Wt 148 lb 9.6 oz (67.4 kg)   BMI 29.26 kg/m   Body mass index is 29.26 kg/m.  General appearance : Well developed well nourished female. No acute distress HEENT: Eyes: no retinal hemorrhage or exudates,  Neck supple, trachea midline, no carotid bruits, no thyroidmegaly Lungs: Clear to auscultation, no rhonchi or wheezes, or rib retractions  Heart: Regular rate and rhythm, no murmurs or gallops Breast:Examined in sitting and supine position were symmetrical in appearance, no palpable masses or tenderness,  no skin retraction, no nipple inversion, no nipple discharge, no skin discoloration, no axillary or supraclavicular lymphadenopathy Abdomen: no palpable masses or tenderness, no rebound or guarding Extremities: no edema or skin discoloration or tenderness  Pelvic: Vulva: Normal             Vagina: No gross lesions or discharge  Cervix: No gross lesions or discharge.  Good support.  Uterus Absent  Adnexa  Without masses or tenderness  Anus: Normal   Assessment/Plan:  79 y.o. female for annual exam   1. Well female exam with routine gynecological exam Normal gynecologic exam in menopause.  No indication to repeat a Pap test.  Breast exam normal.  Screening mammogram February 2021 was negative.  Cologuard last year.  Health labs with family physician.  Good body mass index at 29.26.  Good fitness.  Healthy nutrition.  2. Status post laparoscopic supracervical hysterectomy With Sacrocolpopexy.  Good pelvic support.  3. Postmenopause Well on no hormone replacement therapy.  4.  Age-related osteoporosis without current pathological fracture Osteoporosis on Prolia.  We will repeat a bone density October 2021.  Continue with vitamin D supplements, calcium intake of 1200 to 1500 mg daily and regular weightbearing physical activities.  Princess Bruins MD, 11:15 AM 02/28/2020

## 2020-02-29 ENCOUNTER — Encounter: Payer: Self-pay | Admitting: Obstetrics & Gynecology

## 2020-04-06 ENCOUNTER — Telehealth: Payer: Self-pay | Admitting: *Deleted

## 2020-04-06 NOTE — Telephone Encounter (Signed)
Deductible N/A  OOP MAX [id number] ($[id number])  Annual exam 02/28/2020 ML  Calcium 9.5            Date 05/14/2019  Upcoming dental procedures   Prior Authorization needed   Pt estimated Cost $0  APPT 04/21/2020     Coverage Details: $0 ONE DOSE, $0 ADMIN FEE

## 2020-04-07 ENCOUNTER — Telehealth: Payer: Self-pay | Admitting: Internal Medicine

## 2020-04-07 NOTE — Telephone Encounter (Signed)
Left message for patient to call back and schedule Medicare Annual Wellness Visit (AWV) either virtually or in office.  Last AWV 09/27/17; please schedule at anytime with [facility] Nurse Health Advisor 2.  This should be a 45 minute visit.

## 2020-04-08 DIAGNOSIS — Z23 Encounter for immunization: Secondary | ICD-10-CM | POA: Diagnosis not present

## 2020-04-10 ENCOUNTER — Telehealth: Payer: Medicare Other

## 2020-04-17 ENCOUNTER — Emergency Department (INDEPENDENT_AMBULATORY_CARE_PROVIDER_SITE_OTHER)
Admission: EM | Admit: 2020-04-17 | Discharge: 2020-04-17 | Disposition: A | Discharge status: Home / Self Care | Payer: Medicare Other | Source: Home / Self Care

## 2020-04-17 ENCOUNTER — Other Ambulatory Visit: Payer: Self-pay

## 2020-04-17 DIAGNOSIS — L298 Other pruritus: Secondary | ICD-10-CM

## 2020-04-17 MED ORDER — TRIAMCINOLONE ACETONIDE 0.1 % EX CREA
1.0000 | TOPICAL_CREAM | Freq: Two times a day (BID) | CUTANEOUS | 0 refills | Status: DC
Start: 2020-04-17 — End: 2020-09-23

## 2020-04-17 MED ORDER — PREDNISONE 5 MG PO TABS
5.0000 mg | ORAL_TABLET | Freq: Every day | ORAL | 0 refills | Status: AC
Start: 2020-04-17 — End: 2020-04-22

## 2020-04-17 NOTE — Discharge Instructions (Signed)
  Keep rash clean with warm water and mild soap. Pat dry. Try not to scratch. You can try soaking in a warm bath with [place] soak to help sooth your skin.  Follow up with family medicine next week if not improving. Return this weekend if symptoms worsening.

## 2020-04-17 NOTE — ED Triage Notes (Signed)
Patient presents to Urgent Care with complaints of itchy rash on bilateral legs since about a week ago. Patient reports she applied eucerin cream thinking it might be dry skin but it has not helped.  Pt denies new detergents or soaps.

## 2020-04-17 NOTE — ED Provider Notes (Signed)
[name]    CSN: [id number] Arrival date & time: 04/17/20  1748      History   Chief Complaint Chief Complaint  Patient presents with  . Rash    HPI [name] [name] is a 79 y.o. female.   HPI [name] [name] is a 79 y.o. female presenting to UC with c/o 1 week of itchy red rash to both lower legs.  She has tried plain lotion thinking her skin was dry but then she noticed the red bumps. She does recall walking in [place] Now she wonders if that caused her symptoms. No rashes elsewhere. No new soaps, lotions or medications. Rash is itchy, not painful.    Past Medical History:  Diagnosis Date  . Dog bite of thigh 04/09/2012  . Elevated lipids   . GERD (gastroesophageal reflux disease)   . Hypothyroidism    post thyroidectomy  . Osteopenia    t -2.5 hip in 2005 by Dexa  . Osteoporosis    commpr fx after dog related fall  injury  . Seasonal rhinitis    spring and fall  . Skin cancer    squamous cell ca chest  . Vaginal prolapse    hs corrected    Patient Active Problem List   Diagnosis Date Noted  . Medication management 10/16/2013  . Medicare annual wellness visit, subsequent 07/16/2012  . Skin cancer   . Heart palpitations 05/22/2011  . Family history of sudden death 2011-05-22  . PALPITATIONS 03/31/2010  . BACK PAIN, LUMBAR 10/13/2009  . HEARTBURN 10/13/2009  . FREQUENCY, URINARY 10/13/2009  . FOOT PAIN 03/31/2008  . Hypothyroidism 01/25/2007  . Osteoporosis 01/25/2007  . OSTEOPENIA 01/25/2007    Past Surgical History:  Procedure Laterality Date  . ABDOMINAL HYSTERECTOMY  07/2007  . bladders sling surgery  07/2007  . COLONOSCOPY  2006  . THYROIDECTOMY      OB History    Gravida  1   Para  1   Term      Preterm      AB      Living  1     SAB      TAB      Ectopic      Multiple      Live Births               Home Medications    Prior to Admission  medications   Medication Sig Start Date End Date Taking? Authorizing Provider  b complex vitamins tablet Take 1 tablet by mouth daily.    [provider]  CALCIUM PO Take 1,200 mg by mouth daily.     [provider]  cetirizine (ZYRTEC) 10 MG tablet Take 10 mg by mouth as needed for allergies.    [provider]  cholecalciferol (VITAMIN D) 1000 UNITS tablet Take 1,000 Units by mouth every morning.     [provider]  denosumab (PROLIA) 60 MG/ML SOLN injection Inject 60 mg into the skin every 6 (six) months. Administer in upper arm, thigh, or abdomen    [provider]  fluticasone (FLONASE) 50 MCG/ACT nasal spray 1 or 2 sprays each nostril twice a day 08/03/18   Jacqulyn Cane, MD  levothyroxine (SYNTHROID) 100 MCG tablet Take 1 tablet (100 mcg total) by mouth daily. 10/03/19   Panosh, Standley Brooking, MD  pantoprazole (PROTONIX) 20 MG tablet TAKE 1 TABLET(20 MG) BY MOUTH DAILY 02/03/20  Panosh, Standley Brooking, MD  predniSONE (DELTASONE) 5 MG tablet Take 1 tablet (5 mg total) by mouth daily with breakfast for 5 days. 04/17/20 04/22/20  Noe Gens, PA-C  triamcinolone cream (KENALOG) 0.1 % Apply 1 application topically 2 (two) times daily. 04/17/20   Noe Gens, PA-C    Family History Family History  Problem Relation Age of Onset  . Heart attack Mother   . Alcohol abuse Mother   . Kidney cancer Father   . Hypertension Maternal Grandmother   . Esophageal cancer Neg Hx   . Colon cancer Neg Hx   . Pancreatic cancer Neg Hx   . Stomach cancer Neg Hx     Social History Social History   Tobacco Use  . Smoking status: Never Smoker  . Smokeless tobacco: Never Used  Vaping Use  . Vaping Use: Never used  Substance Use Topics  . Alcohol use: Yes    Alcohol/week: 1.0 standard drink    Types: 1 Glasses of wine per week    Comment: occasionally  . Drug use: No     Allergies   Milk-related compounds, Onion, and Other   Review of Systems Review of  Systems  Musculoskeletal: Negative for arthralgias, joint swelling and myalgias.  Skin: Positive for rash. Negative for wound.     Physical Exam Triage Vital Signs ED Triage Vitals  Enc Vitals Group     BP 04/17/20 1800 132/76     Pulse Rate 04/17/20 1800 74     Resp 04/17/20 1800 16     Temp 04/17/20 1800 99 F (37.2 C)     Temp Source 04/17/20 1800 Oral     SpO2 04/17/20 1800 99 %     Weight --      Height --      Head Circumference --      Peak Flow --      Pain Score 04/17/20 1758 0     Pain Loc --      Pain Edu? --      Excl. in [place]? --    No data found.  Updated Vital Signs BP 132/76 (BP Location: Right Arm)   Pulse 74   Temp 99 F (37.2 C) (Oral)   Resp 16   SpO2 99%   Visual Acuity Right Eye Distance:   Left Eye Distance:   Bilateral Distance:    Right Eye Near:   Left Eye Near:    Bilateral Near:     Physical Exam Vitals and nursing note reviewed.  Constitutional:      Appearance: She is well-developed.  HENT:     Head: Normocephalic and atraumatic.  Cardiovascular:     Rate and Rhythm: Normal rate.  Pulmonary:     Effort: Pulmonary effort is normal.  Musculoskeletal:        General: Normal range of motion.     Cervical back: Normal range of motion.  Skin:    General: Skin is warm and dry.     Findings: Erythema and rash present.     Comments: Bilateral lower legs; erythematous papular lesions. Rash does blanch. Non-tender.  Neurological:     Mental Status: She is alert and oriented to person, place, and time.  Psychiatric:        Behavior: Behavior normal.      UC Treatments / Results  Labs (all labs ordered are listed, but only abnormal results are displayed) Labs Reviewed - No data to display  EKG  Radiology No results found.  Procedures Procedures (including critical care time)  Medications Ordered in UC Medications - No data to display  Initial Impression / Assessment and Plan / UC Course  I have reviewed the triage  vital signs and the nursing notes.  Pertinent labs & imaging results that were available during my care of the patient were reviewed by me and considered in my medical decision making (see chart for details).     Suspect rash due to insect bites such as chiggers Home care instructions discussed  F/u with PCP next week if not improving, follow up sooner if worsening.  Final Clinical Impressions(s) / UC Diagnoses   Final diagnoses:  Pruritic erythematous rash     Discharge Instructions      Keep rash clean with warm water and mild soap. Pat dry. Try not to scratch. You can try soaking in a warm bath with [place] soak to help sooth your skin.  Follow up with family medicine next week if not improving. Return this weekend if symptoms worsening.     ED Prescriptions    Medication Sig Dispense Auth. Provider   triamcinolone cream (KENALOG) 0.1 % Apply 1 application topically 2 (two) times daily. 30 g Gerarda Fraction, Archie Atilano O, PA-C   predniSONE (DELTASONE) 5 MG tablet Take 1 tablet (5 mg total) by mouth daily with breakfast for 5 days. 5 tablet Noe Gens, PA-C     PDMP not reviewed this encounter.   Noe Gens, [place] 04/17/20 1835

## 2020-04-21 ENCOUNTER — Other Ambulatory Visit: Payer: Self-pay

## 2020-04-21 ENCOUNTER — Ambulatory Visit (INDEPENDENT_AMBULATORY_CARE_PROVIDER_SITE_OTHER): Payer: Medicare Other | Admitting: Anesthesiology

## 2020-04-21 DIAGNOSIS — M810 Age-related osteoporosis without current pathological fracture: Secondary | ICD-10-CM | POA: Diagnosis not present

## 2020-04-21 DIAGNOSIS — Z23 Encounter for immunization: Secondary | ICD-10-CM

## 2020-04-21 MED ORDER — DENOSUMAB 60 MG/ML ~~LOC~~ SOSY
60.0000 mg | PREFILLED_SYRINGE | Freq: Once | SUBCUTANEOUS | Status: AC
  Administered 2020-04-21: 60 mg via SUBCUTANEOUS

## 2020-05-17 ENCOUNTER — Other Ambulatory Visit: Payer: Self-pay | Admitting: Internal Medicine

## 2020-05-18 ENCOUNTER — Telehealth: Payer: Self-pay | Admitting: *Deleted

## 2020-05-18 DIAGNOSIS — M85852 Other specified disorders of bone density and structure, left thigh: Secondary | ICD-10-CM | POA: Diagnosis not present

## 2020-05-18 DIAGNOSIS — M85851 Other specified disorders of bone density and structure, right thigh: Secondary | ICD-10-CM | POA: Diagnosis not present

## 2020-05-18 LAB — HM DEXA SCAN

## 2020-05-18 NOTE — Telephone Encounter (Signed)
[facility] called patient there now for bone density and they need order. Order faxed to [phone]

## 2020-05-25 ENCOUNTER — Encounter: Payer: Self-pay | Admitting: Anesthesiology

## 2020-06-10 NOTE — Telephone Encounter (Signed)
I spoke with patient and informed her of BD result/recommendation.

## 2020-07-10 ENCOUNTER — Telehealth: Payer: Self-pay | Admitting: Internal Medicine

## 2020-07-10 NOTE — Telephone Encounter (Signed)
Left message for patient to call back and schedule Medicare Annual Wellness Visit (AWV) either virtually or in office.   Last AWV 10/07/17  please schedule at anytime with [facility] Nurse Health Advisor 1 or 2   This should be a 45 minute visit.

## 2020-08-04 DIAGNOSIS — M47816 Spondylosis without myelopathy or radiculopathy, lumbar region: Secondary | ICD-10-CM | POA: Diagnosis not present

## 2020-08-04 DIAGNOSIS — M4316 Spondylolisthesis, lumbar region: Secondary | ICD-10-CM | POA: Diagnosis not present

## 2020-08-05 ENCOUNTER — Other Ambulatory Visit: Payer: Self-pay | Admitting: Internal Medicine

## 2020-08-11 ENCOUNTER — Other Ambulatory Visit: Payer: Self-pay | Admitting: Internal Medicine

## 2020-08-14 DIAGNOSIS — Z1231 Encounter for screening mammogram for malignant neoplasm of breast: Secondary | ICD-10-CM | POA: Diagnosis not present

## 2020-08-14 LAB — HM MAMMOGRAPHY

## 2020-08-18 DIAGNOSIS — G8929 Other chronic pain: Secondary | ICD-10-CM | POA: Diagnosis not present

## 2020-08-18 DIAGNOSIS — M545 Low back pain, unspecified: Secondary | ICD-10-CM | POA: Diagnosis not present

## 2020-08-28 DIAGNOSIS — D3132 Benign neoplasm of left choroid: Secondary | ICD-10-CM | POA: Diagnosis not present

## 2020-08-28 DIAGNOSIS — H2513 Age-related nuclear cataract, bilateral: Secondary | ICD-10-CM | POA: Diagnosis not present

## 2020-08-28 DIAGNOSIS — H35363 Drusen (degenerative) of macula, bilateral: Secondary | ICD-10-CM | POA: Diagnosis not present

## 2020-09-02 NOTE — Telephone Encounter (Signed)
PROLIA GIVEN 04/21/2020 NEXT INJECTION 10/21/2020

## 2020-09-14 DIAGNOSIS — G8929 Other chronic pain: Secondary | ICD-10-CM | POA: Diagnosis not present

## 2020-09-14 DIAGNOSIS — M545 Low back pain, unspecified: Secondary | ICD-10-CM | POA: Diagnosis not present

## 2020-09-22 NOTE — Progress Notes (Signed)
Chief Complaint  Patient presents with  . Annual Exam    HPI: [name] [name] 80 y.o. comes in today for yearly   Visit.  vidoe in April 2021   Thyroid; taking thyroid medicine without difficulty. Osteoporosis getting Prolia injections through her GYN.  Is due for lab work. No recent fracture falling does regular exercise walking and is physically active with her work. Only recent thing was ate something and had what she calls dysentery which was rapid diarrhea that was green but is now subsided otherwise no change in her bowel habits.  Asks about cardiovascular risk family history Father died of renal cancer mom died suddenly in her 31s unknown diagnosis.  A brother had cardiomegaly but alcohol and smoking.  See specialist [facility] about her back in a PT program history of injections.  Health Maintenance  Topic Date Due  . Hepatitis C Screening  Never done  . COVID-19 Vaccine (4 - Booster for Pfizer series) 10/07/2020  . TETANUS/TDAP  11/10/2025  . INFLUENZA VACCINE  Completed  . DEXA SCAN  Completed  . PNA vac Low Risk Adult  Completed  . HPV VACCINES  Aged Out   Health Maintenance Review LIFESTYLE:  Exercise:  Active  Tobacco/ETS: Alcohol:  Sugar beverages: green tea .  Sleep: 6-7  Drug use: no HH: 1  2 dogs  Animals    cats outside 40 plus work  Widowed august 21    ROS: See HPI   Past Medical History:  Diagnosis Date  . Dog bite of thigh 04/09/2012  . Elevated lipids   . GERD (gastroesophageal reflux disease)   . Hypothyroidism    post thyroidectomy  . Osteopenia    t -2.5 hip in 2005 by Dexa  . Osteoporosis    commpr fx after dog related fall  injury  . Seasonal rhinitis    spring and fall  . Skin cancer    squamous cell ca chest  . Vaginal prolapse    hs corrected    Family History  Problem Relation Age of Onset  . Heart attack Mother   . Alcohol abuse Mother   . Kidney cancer Father   . Hypertension Maternal Grandmother   . Esophageal cancer  Neg Hx   . Colon cancer Neg Hx   . Pancreatic cancer Neg Hx   . Stomach cancer Neg Hx     Social History   Socioeconomic History  . Marital status: Married    Spouse name: Not on file  . Number of children: Not on file  . Years of education: Not on file  . Highest education level: Not on file  Occupational History  . Not on file  Tobacco Use  . Smoking status: Never Smoker  . Smokeless tobacco: Never Used  Vaping Use  . Vaping Use: Never used  Substance and Sexual Activity  . Alcohol use: Yes    Alcohol/week: 1.0 standard drink    Types: 1 Glasses of wine per week    Comment: occasionally  . Drug use: No  . Sexual activity: Not Currently    Comment: 1st itnercourse- 19, partners- 2, married- 1 yrs   Other Topics Concern  . 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 Difficulty of Paying Living Expenses: Not hard at all  Food Insecurity: Not on file  Transportation Needs: No Transportation Needs  . Lack of Transportation (Medical): No  . Lack of Transportation (Non-Medical): No  Physical Activity: Not on file  Stress: Not on file  Social Connections: Not on file    Outpatient Encounter Medications as of 09/23/2020  Medication Sig  . b complex vitamins tablet Take 1 tablet by mouth daily.  Marland Kitchen CALCIUM PO Take 1,200 mg by mouth daily.   . cetirizine (ZYRTEC) 10 MG tablet Take 10 mg by mouth as needed for allergies.  . cholecalciferol (VITAMIN D) 1000 UNITS tablet Take 1,000 Units by mouth every morning.  . denosumab (PROLIA) 60 MG/ML SOLN injection Inject 60 mg into the skin every 6 (six) months. Administer in upper arm, thigh, or abdomen  . fluticasone (FLONASE) 50 MCG/ACT nasal spray 1 or 2 sprays each nostril twice a day  . levothyroxine (SYNTHROID) 100 MCG tablet  TAKE 1 TABLET(100 MCG) BY MOUTH DAILY  . pantoprazole (PROTONIX) 20 MG tablet TAKE 1 TABLET(20 MG) BY MOUTH DAILY  . [DISCONTINUED] triamcinolone cream (KENALOG) 0.1 % Apply 1 application topically 2 (two) times daily.   No facility-administered encounter medications on file as of 09/23/2020.    EXAM:  BP 128/64 (BP Location: Left Arm, Patient Position: Sitting, Cuff Size: Normal)   Pulse 69   Temp 98.2 F (36.8 C) (Oral)   Ht 5\' 1"  (1.549 m)   Wt 155 lb (70.3 kg)   SpO2 95%   BMI 29.29 kg/m   Body mass index is 29.29 kg/m.  Physical Exam: Vital signs reviewed VEL:FYBO is a well-developed well-nourished alert cooperative   who appears stated age in no acute distress.  HEENT: normocephalic atraumatic , Eyes: PERRL EOM's full, conjunctiva clear, Nares: paten,t no deformity discharge or tenderness., Ears: no deformity EAC's clear TMs with normal landmarks. Mouth:masked NECK: supple without masses, thyromegaly or bruits. CHEST/PULM:  Clear to auscultation and percussion breath sounds equal no wheeze , rales or rhonchi. No chest wall deformities or tenderness. CV: PMI is nondisplaced, S1 S2 no gallops, murmurs, rubs. Peripheral pulses are full without delay.No JVD .  ABDOMEN: Bowel sounds normal nontender  No guard or rebound, no hepato splenomegal no CVA tenderness.   Extremtities:  No clubbing cyanosis or edema, no acute joint swelling or redness no focal atrophy NEURO:  Oriented x3, cranial nerves 3-12 appear to be intact, no obvious focal weakness,gait within normal limits no abnormal reflexes or asymmetrical SKIN: No acute rashes normal turgor, color, no bruising or petechiae. PSYCH: Oriented, good eye contact, no obvious depression anxiety, cognition and judgment appear normal. LN: no cervical axillary inguinal adenopathy No noted deficits in memory, attention, and speech.   Lab Results  Component Value Date   WBC 6.1 05/14/2019   HGB 12.5 05/14/2019   HCT 37.6 05/14/2019    PLT 295.0 05/14/2019   GLUCOSE 94 05/14/2019   CHOL 201 (H) 05/14/2019   TRIG 89.0 05/14/2019   HDL 55.40 05/14/2019   LDLDIRECT 134.1 12/27/2006   LDLCALC 127 (H) 05/14/2019   ALT 18 05/14/2019   AST 28 05/14/2019   NA 141 05/14/2019   K 3.9 05/14/2019   CL 106 05/14/2019   CREATININE 0.77 05/14/2019   BUN 15 05/14/2019   CO2 29 05/14/2019   TSH 3.38 09/30/2019   INR 1.0 07/25/2007   Fasting.  Today ASSESSMENT AND PLAN:  Discussed  the following assessment and plan:  Hypothyroidism, unspecified type - Plan: Basic metabolic panel, CBC with Differential/Platelet, Hepatic function panel, Lipid panel, TSH, T4, free, Hemoglobin A1c, VITAMIN D 25 Hydroxy (Vit-D Deficiency, Fractures), Basic metabolic panel, CBC with Differential/Platelet, Hepatic function panel, Lipid panel, TSH, T4, free, Hemoglobin A1c, VITAMIN D 25 Hydroxy (Vit-D Deficiency, Fractures)  Medication management - Plan: Basic metabolic panel, CBC with Differential/Platelet, Hepatic function panel, Lipid panel, TSH, T4, free, Hemoglobin A1c, VITAMIN D 25 Hydroxy (Vit-D Deficiency, Fractures), Basic metabolic panel, CBC with Differential/Platelet, Hepatic function panel, Lipid panel, TSH, T4, free, Hemoglobin A1c, VITAMIN D 25 Hydroxy (Vit-D Deficiency, Fractures)  Age-related osteoporosis without current pathological fracture - Plan: Basic metabolic panel, CBC with Differential/Platelet, Hepatic function panel, Lipid panel, TSH, T4, free, Hemoglobin A1c, VITAMIN D 25 Hydroxy (Vit-D Deficiency, Fractures), Basic metabolic panel, CBC with Differential/Platelet, Hepatic function panel, Lipid panel, TSH, T4, free, Hemoglobin A1c, VITAMIN D 25 Hydroxy (Vit-D Deficiency, Fractures)  Hyperlipidemia, unspecified hyperlipidemia type - Plan: Basic metabolic panel, CBC with Differential/Platelet, Hepatic function panel, Lipid panel, TSH, T4, free, Hemoglobin A1c, VITAMIN D 25 Hydroxy (Vit-D Deficiency, Fractures), Basic metabolic panel,  CBC with Differential/Platelet, Hepatic function panel, Lipid panel, TSH, T4, free, Hemoglobin A1c, VITAMIN D 25 Hydroxy (Vit-D Deficiency, Fractures)  High serum vitamin D - Plan: Basic metabolic panel, CBC with Differential/Platelet, Hepatic function panel, Lipid panel, TSH, T4, free, Hemoglobin A1c, VITAMIN D 25 Hydroxy (Vit-D Deficiency, Fractures), Basic metabolic panel, CBC with Differential/Platelet, Hepatic function panel, Lipid panel, TSH, T4, free, Hemoglobin A1c, VITAMIN D 25 Hydroxy (Vit-D Deficiency, Fractures) Check lab today Discussed her interest in cardiovascular risk assessment even at her age she could consider statin medicine although certain benefit versus risk in her age group she is healthy otherwise no other risk factors but age Offered coronary artery calcium scan although stated before usually decide whether want to go on a statin medicine. He is interested in this. Patient Care Team: Laysha Childers, Standley Brooking, MD as PCP - General Princess Bruins, MD as Attending Physician (Obstetrics and Gynecology) Addison Lank, MD as Referring Physician (Dermatology) De Nurse, [facility] as Pharmacist (Pharmacist) Princess Bruins, MD as Consulting Physician (Obstetrics and Gynecology)   Patient Instructions   Will notify you  of labs when available.   Checking  Lipids and  Calcium and vit d levels and sugar levels  Will order the coronary calcium score for risk assessment  As discussed   Usually 99$ out of pocket  .  To decide if add  Statin med or other .     Coronary Calcium Scan A coronary calcium scan is an imaging test used to look for deposits of plaque in the inner lining of the blood vessels of the heart (coronary arteries). Plaque is made up of calcium, protein, and fatty substances. These deposits of plaque can partly clog and narrow the coronary arteries without producing any symptoms or warning signs. This puts a person at risk for a heart attack. This  test is recommended for people who are at moderate risk for heart disease. The test can find plaque deposits before symptoms develop. Tell a health care provider about:  Any allergies you have.  All medicines you are taking, including vitamins, herbs, eye drops, creams, and over-the-counter medicines.  Any problems you or family members have had with anesthetic medicines.  Any blood disorders you have.  Any surgeries you have had.  Any medical conditions you have.  Whether you are pregnant or may be  pregnant. What are the risks? Generally, this is a safe procedure. However, problems may occur, including:  Harm to a pregnant woman and her unborn baby. This test involves the use of radiation. Radiation exposure can be dangerous to a pregnant woman and her unborn baby. If you are pregnant or think you may be pregnant, you should not have this procedure done.  Slight increase in the risk of cancer. This is because of the radiation involved in the test. What happens before the procedure? Ask your health care provider for any specific instructions on how to prepare for this procedure. You may be asked to avoid products that contain caffeine, tobacco, or nicotine for 4 hours before the procedure. What happens during the procedure?  You will undress and remove any jewelry from your neck or chest.  You will put on a hospital gown.  Sticky electrodes will be placed on your chest. The electrodes will be connected to an electrocardiogram (ECG) machine to record a tracing of the electrical activity of your heart.  You will lie down on a curved bed that is attached to the Dresser.  You may be given medicine to slow down your heart rate so that clear pictures can be created.  You will be moved into the CT scanner, and the CT scanner will take pictures of your heart. During this time, you will be asked to lie still and hold your breath for 2-3 seconds at a time while each picture of your heart  is being taken. The procedure may vary among health care providers and hospitals.   What happens after the procedure?  You can get dressed.  You can return to your normal activities.  It is up to you to get the results of your procedure. Ask your health care provider, or the department that is doing the procedure, when your results will be ready. Summary  A coronary calcium scan is an imaging test used to look for deposits of plaque in the inner lining of the blood vessels of the heart (coronary arteries). Plaque is made up of calcium, protein, and fatty substances.  Generally, this is a safe procedure. Tell your health care provider if you are pregnant or may be pregnant.  Ask your health care provider for any specific instructions on how to prepare for this procedure.  A CT scanner will take pictures of your heart.  You can return to your normal activities after the scan is done. This information is not intended to replace advice given to you by your health care provider. Make sure you discuss any questions you have with your health care provider. Document Revised: 01/01/2019 Document Reviewed: 01/01/2019 Elsevier Patient Education  2021 [place]. Dao Mearns M.D.

## 2020-09-23 ENCOUNTER — Other Ambulatory Visit: Payer: Self-pay

## 2020-09-23 ENCOUNTER — Ambulatory Visit (INDEPENDENT_AMBULATORY_CARE_PROVIDER_SITE_OTHER): Payer: Medicare Other | Admitting: Internal Medicine

## 2020-09-23 ENCOUNTER — Encounter: Payer: Self-pay | Admitting: Internal Medicine

## 2020-09-23 ENCOUNTER — Telehealth: Payer: Self-pay | Admitting: *Deleted

## 2020-09-23 VITALS — BP 128/64 | HR 69 | Temp 98.2°F | Ht 61.0 in | Wt 155.0 lb

## 2020-09-23 DIAGNOSIS — E039 Hypothyroidism, unspecified: Secondary | ICD-10-CM | POA: Diagnosis not present

## 2020-09-23 DIAGNOSIS — R7989 Other specified abnormal findings of blood chemistry: Secondary | ICD-10-CM

## 2020-09-23 DIAGNOSIS — E785 Hyperlipidemia, unspecified: Secondary | ICD-10-CM | POA: Diagnosis not present

## 2020-09-23 DIAGNOSIS — M810 Age-related osteoporosis without current pathological fracture: Secondary | ICD-10-CM

## 2020-09-23 DIAGNOSIS — Z79899 Other long term (current) drug therapy: Secondary | ICD-10-CM | POA: Diagnosis not present

## 2020-09-23 DIAGNOSIS — Z9189 Other specified personal risk factors, not elsewhere classified: Secondary | ICD-10-CM | POA: Diagnosis not present

## 2020-09-23 LAB — BASIC METABOLIC PANEL
BUN: 26 mg/dL — ABNORMAL HIGH (ref 6–23)
CO2: 28 mEq/L (ref 19–32)
Calcium: 9.7 mg/dL (ref 8.4–10.5)
Chloride: 106 mEq/L (ref 96–112)
Creatinine, Ser: 0.76 mg/dL (ref 0.40–1.20)
GFR: 74.68 mL/min (ref >60.00)
Glucose, Bld: 94 mg/dL (ref 70–99)
Potassium: 4 mEq/L (ref 3.5–5.1)
Sodium: 142 mEq/L (ref 135–145)

## 2020-09-23 LAB — VITAMIN D 25 HYDROXY (VIT D DEFICIENCY, FRACTURES): VITD: 69.75 ng/mL (ref 30.00–100.00)

## 2020-09-23 LAB — HEPATIC FUNCTION PANEL
ALT: 18 U/L (ref 0–35)
AST: 26 U/L (ref 0–37)
Albumin: 4.4 g/dL (ref 3.5–5.2)
Alkaline Phosphatase: 34 U/L — ABNORMAL LOW (ref 39–117)
Bilirubin, Direct: 0.2 mg/dL (ref 0.0–0.3)
Total Bilirubin: 1.3 mg/dL — ABNORMAL HIGH (ref 0.2–1.2)
Total Protein: 6.7 g/dL (ref 6.0–8.3)

## 2020-09-23 LAB — CBC WITH DIFFERENTIAL/PLATELET
Basophils Absolute: 0 10*3/uL (ref 0.0–0.1)
Basophils Relative: 0 % (ref 0.0–3.0)
Eosinophils Absolute: 0.3 10*3/uL (ref 0.0–0.7)
Eosinophils Relative: 5.3 % — ABNORMAL HIGH (ref 0.0–5.0)
HCT: 38.3 % (ref 36.0–46.0)
Hemoglobin: 12.7 g/dL (ref 12.0–15.0)
Lymphocytes Relative: 34.4 % (ref 12.0–46.0)
Lymphs Abs: 2.2 10*3/uL (ref 0.7–4.0)
MCHC: 33.3 g/dL (ref 30.0–36.0)
MCV: 95 fl (ref 78.0–100.0)
Monocytes Absolute: 0.6 10*3/uL (ref 0.1–1.0)
Monocytes Relative: 9.5 % (ref 3.0–12.0)
Neutro Abs: 3.2 10*3/uL (ref 1.4–7.7)
Neutrophils Relative %: 50.8 % (ref 43.0–77.0)
Platelets: 276 10*3/uL (ref 150.0–400.0)
RBC: 4.03 Mil/uL (ref 3.87–5.11)
RDW: 15.2 % (ref 11.5–15.5)
WBC: 6.4 10*3/uL (ref 4.0–10.5)

## 2020-09-23 LAB — LIPID PANEL
Cholesterol: 186 mg/dL (ref 0–200)
HDL: 61.1 mg/dL (ref >39.00)
LDL Cholesterol: 112 mg/dL — ABNORMAL HIGH (ref 0–99)
NonHDL: 125.29
Total CHOL/HDL Ratio: 3
Triglycerides: 64 mg/dL (ref 0.0–149.0)
VLDL: 12.8 mg/dL (ref 0.0–40.0)

## 2020-09-23 LAB — T4, FREE: Free T4: 1.12 ng/dL (ref 0.60–1.60)

## 2020-09-23 LAB — HEMOGLOBIN A1C: Hgb A1c MFr Bld: 5.8 % (ref 4.6–6.5)

## 2020-09-23 LAB — TSH: TSH: 1.25 u[IU]/mL (ref 0.35–4.50)

## 2020-09-23 NOTE — Patient Instructions (Signed)
Will notify you  of labs when available.   Checking  Lipids and  Calcium and vit d levels and sugar levels  Will order the coronary calcium score for risk assessment  As discussed   Usually 99$ out of pocket  .  To decide if add  Statin med or other .     Coronary Calcium Scan A coronary calcium scan is an imaging test used to look for deposits of plaque in the inner lining of the blood vessels of the heart (coronary arteries). Plaque is made up of calcium, protein, and fatty substances. These deposits of plaque can partly clog and narrow the coronary arteries without producing any symptoms or warning signs. This puts a person at risk for a heart attack. This test is recommended for people who are at moderate risk for heart disease. The test can find plaque deposits before symptoms develop. Tell a health care provider about:  Any allergies you have.  All medicines you are taking, including vitamins, herbs, eye drops, creams, and over-the-counter medicines.  Any problems you or family members have had with anesthetic medicines.  Any blood disorders you have.  Any surgeries you have had.  Any medical conditions you have.  Whether you are pregnant or may be pregnant. What are the risks? Generally, this is a safe procedure. However, problems may occur, including:  Harm to a pregnant woman and her unborn baby. This test involves the use of radiation. Radiation exposure can be dangerous to a pregnant woman and her unborn baby. If you are pregnant or think you may be pregnant, you should not have this procedure done.  Slight increase in the risk of cancer. This is because of the radiation involved in the test. What happens before the procedure? Ask your health care provider for any specific instructions on how to prepare for this procedure. You may be asked to avoid products that contain caffeine, tobacco, or nicotine for 4 hours before the procedure. What happens during the  procedure?  You will undress and remove any jewelry from your neck or chest.  You will put on a hospital gown.  Sticky electrodes will be placed on your chest. The electrodes will be connected to an electrocardiogram (ECG) machine to record a tracing of the electrical activity of your heart.  You will lie down on a curved bed that is attached to the [place].  You may be given medicine to slow down your heart rate so that clear pictures can be created.  You will be moved into the CT scanner, and the CT scanner will take pictures of your heart. During this time, you will be asked to lie still and hold your breath for 2-3 seconds at a time while each picture of your heart is being taken. The procedure may vary among health care providers and hospitals.   What happens after the procedure?  You can get dressed.  You can return to your normal activities.  It is up to you to get the results of your procedure. Ask your health care provider, or the department that is doing the procedure, when your results will be ready. Summary  A coronary calcium scan is an imaging test used to look for deposits of plaque in the inner lining of the blood vessels of the heart (coronary arteries). Plaque is made up of calcium, protein, and fatty substances.  Generally, this is a safe procedure. Tell your health care provider if you are pregnant or may be pregnant.  Ask your health care provider for any specific instructions on how to prepare for this procedure.  A CT scanner will take pictures of your heart.  You can return to your normal activities after the scan is done. This information is not intended to replace advice given to you by your health care provider. Make sure you discuss any questions you have with your health care provider. Document Revised: 01/01/2019 Document Reviewed: 01/01/2019 Elsevier Patient Education  [place].

## 2020-09-23 NOTE — Telephone Encounter (Addendum)
Deductible $233  OOP MAX n/a  Annual exam 02/28/2020  Calcium  9.7             Date 09/23/20  Upcoming dental procedures   Prior Authorization needed NO  Pt estimated Cost $0   appt 10/22/2019    Coverage Details: $0 one dose,$0admin fee

## 2020-09-25 ENCOUNTER — Telehealth: Payer: Self-pay

## 2020-09-25 NOTE — Telephone Encounter (Signed)
Patient informed of the results and verbalized understanding 

## 2020-09-25 NOTE — Progress Notes (Signed)
Results either normal or out of range insignificant except cholesterol slightly up.  But improved from last check Vitamin D in normal range. Forwarding results to your GYN

## 2020-09-25 NOTE — Telephone Encounter (Signed)
LVM for call back to discuss results also results are posted to my chart.

## 2020-10-02 NOTE — Telephone Encounter (Signed)
LVM to call back for results, results were told to patient on 09/25/2020

## 2020-10-06 DIAGNOSIS — M545 Low back pain, unspecified: Secondary | ICD-10-CM | POA: Diagnosis not present

## 2020-10-06 DIAGNOSIS — G8929 Other chronic pain: Secondary | ICD-10-CM | POA: Diagnosis not present

## 2020-10-21 ENCOUNTER — Other Ambulatory Visit: Payer: Self-pay

## 2020-10-21 ENCOUNTER — Ambulatory Visit (INDEPENDENT_AMBULATORY_CARE_PROVIDER_SITE_OTHER): Payer: Medicare Other | Admitting: *Deleted

## 2020-10-21 DIAGNOSIS — M810 Age-related osteoporosis without current pathological fracture: Secondary | ICD-10-CM | POA: Diagnosis not present

## 2020-10-21 MED ORDER — DENOSUMAB 60 MG/ML ~~LOC~~ SOSY
60.0000 mg | PREFILLED_SYRINGE | Freq: Once | SUBCUTANEOUS | Status: AC
  Administered 2020-10-21: 60 mg via SUBCUTANEOUS

## 2020-10-25 DIAGNOSIS — U071 COVID-19: Secondary | ICD-10-CM

## 2020-10-25 HISTORY — DX: COVID-19: U07.1

## 2020-10-27 DIAGNOSIS — K1329 Other disturbances of oral epithelium, including tongue: Secondary | ICD-10-CM | POA: Diagnosis not present

## 2020-10-29 DIAGNOSIS — M7062 Trochanteric bursitis, left hip: Secondary | ICD-10-CM | POA: Diagnosis not present

## 2020-10-29 DIAGNOSIS — M25552 Pain in left hip: Secondary | ICD-10-CM | POA: Diagnosis not present

## 2020-10-29 DIAGNOSIS — M4316 Spondylolisthesis, lumbar region: Secondary | ICD-10-CM | POA: Diagnosis not present

## 2020-10-29 DIAGNOSIS — M47816 Spondylosis without myelopathy or radiculopathy, lumbar region: Secondary | ICD-10-CM | POA: Diagnosis not present

## 2020-10-29 DIAGNOSIS — M1612 Unilateral primary osteoarthritis, left hip: Secondary | ICD-10-CM | POA: Diagnosis not present

## 2020-10-30 ENCOUNTER — Other Ambulatory Visit: Payer: Self-pay

## 2020-10-30 ENCOUNTER — Encounter: Payer: Self-pay | Admitting: Emergency Medicine

## 2020-10-30 ENCOUNTER — Telehealth: Payer: Self-pay | Admitting: Emergency Medicine

## 2020-10-30 ENCOUNTER — Emergency Department (INDEPENDENT_AMBULATORY_CARE_PROVIDER_SITE_OTHER)
Admission: EM | Admit: 2020-10-30 | Discharge: 2020-10-30 | Disposition: A | Discharge status: Home / Self Care | Payer: Medicare Other | Source: Home / Self Care | Attending: Family Medicine | Admitting: Family Medicine

## 2020-10-30 ENCOUNTER — Telehealth: Payer: Self-pay | Admitting: Family Medicine

## 2020-10-30 DIAGNOSIS — J069 Acute upper respiratory infection, unspecified: Secondary | ICD-10-CM

## 2020-10-30 MED ORDER — AMOXICILLIN 875 MG PO TABS: 875.0000 mg | ORAL_TABLET | Freq: Two times a day (BID) | ORAL | 0 refills | Status: DC

## 2020-10-30 NOTE — ED Triage Notes (Addendum)
Post nasal drip started on wed night - Sinus facial pressure  flonase OTC  ASA at 0400 this am Oral surgery on Monday - stitches in place per pt COVID vaccine & booster (07/2020)

## 2020-10-30 NOTE — Discharge Instructions (Addendum)
Take plain guaifenesin (1200mg  extended release tabs such as Mucinex) twice daily, with plenty of water, for cough and congestion. Get adequate rest.   May use Afrin nasal spray (or generic oxymetazoline) each morning for about 5 days and then discontinue.  Also recommend using saline nasal spray several times daily and saline nasal irrigation (AYR is a common brand).  Use Flonase nasal spray each morning after using Afrin nasal spray and saline nasal irrigation. Try warm salt water gargles for sore throat.  Stop all antihistamines (Zyrtec, etc) for now, and other non-prescription cough/cold preparations. May take Tylenol as needed for fever, headache, etc. Begin amoxicillin if not improving about one week or if persistent facial pain and fever develop.    May take Delsym Cough Suppressant ("12 Hour Cough Relief") at bedtime for nighttime cough.   If your COVID-19 test is positive, isolate yourself for five days from the time of your symptom onset.  At the end of five days you may end isolation if your symptoms have cleared or improved, and you have not had a fever for 24 hours. At this time you should wear a mask for five more days when you are around others.

## 2020-10-30 NOTE — ED Provider Notes (Signed)
[name]    CSN: [id number] Arrival date & time: 10/30/20  1223      History   Chief Complaint Chief Complaint  Patient presents with  . sinus pressure  . Nasal Congestion    HPI [name] [name] is a 80 y.o. female.   Two days ago patient suddenly developed post-nasal drainage.  Yesterday she developed increased sinus congestion, facial pressure, sneezing, fatigue, and a mild cough.  She denies fevers, chills, and sweats. She reports that she had a dental biopsy five days ago and still has dissolvable sutures in her left upper posterior gingiva.  She is concerned about developing possible dental infection.  The history is provided by the patient.    Past Medical History:  Diagnosis Date  . Dog bite of thigh 04/09/2012  . Elevated lipids   . GERD (gastroesophageal reflux disease)   . Hypothyroidism    post thyroidectomy  . Osteopenia    t -2.5 hip in 2005 by [facility]  . Osteoporosis    commpr fx after dog related fall  injury  . Seasonal rhinitis    spring and fall  . Skin cancer    squamous cell ca chest  . Vaginal prolapse    hs corrected    Patient Active Problem List   Diagnosis Date Noted  . Medication management 10/16/2013  . Medicare annual wellness visit, subsequent 07/16/2012  . Skin cancer   . Heart palpitations 2011-05-28  . Family history of sudden death 05/28/11  . PALPITATIONS 03/31/2010  . BACK PAIN, LUMBAR 10/13/2009  . HEARTBURN 10/13/2009  . FREQUENCY, URINARY 10/13/2009  . FOOT PAIN 03/31/2008  . Hypothyroidism 01/25/2007  . Osteoporosis 01/25/2007  . OSTEOPENIA 01/25/2007    Past Surgical History:  Procedure Laterality Date  . ABDOMINAL HYSTERECTOMY  07/2007  . bladders sling surgery  07/2007  . COLONOSCOPY  2006  . THYROIDECTOMY      OB History    Gravida  1   Para  1   Term      Preterm      AB      Living  1     SAB      IAB      Ectopic      Multiple      Live Births               Home  Medications    Prior to Admission medications   Medication Sig Start Date End Date Taking? Authorizing Provider  b complex vitamins tablet Take 1 tablet by mouth daily.   Yes [provider]  CALCIUM PO Take 1,200 mg by mouth daily.    Yes [provider]  cholecalciferol (VITAMIN D) 1000 UNITS tablet Take 1,000 Units by mouth every morning.   Yes [provider]  fluticasone (FLONASE) 50 MCG/ACT nasal spray 1 or 2 sprays each nostril twice a day 08/03/18  Yes Jacqulyn Cane, MD  levothyroxine (SYNTHROID) 100 MCG tablet TAKE 1 TABLET(100 MCG) BY MOUTH DAILY 08/05/20  Yes Panosh, Standley Brooking, MD  pantoprazole (PROTONIX) 20 MG tablet TAKE 1 TABLET(20 MG) BY MOUTH DAILY 08/11/20  Yes Panosh, Standley Brooking, MD  amoxicillin (AMOXIL) 875 MG tablet Take 1 tablet (875 mg total) by mouth 2 (two) times daily. 10/30/20   Kandra Nicolas, MD  cetirizine (ZYRTEC) 10 MG tablet Take 10 mg by mouth as needed for allergies. Patient not taking: Reported on 10/30/2020    [provider]  chlorhexidine ([place])  0.12 % solution SMARTSIG:By Mouth 10/27/20   [provider]  denosumab (PROLIA) 60 MG/ML SOLN injection Inject 60 mg into the skin every 6 (six) months. Administer in upper arm, thigh, or abdomen    [provider]    Family History Family History  Problem Relation Age of Onset  . Heart attack Mother   . Alcohol abuse Mother   . Kidney cancer Father   . Hypertension Maternal Grandmother   . Esophageal cancer Neg Hx   . Colon cancer Neg Hx   . Pancreatic cancer Neg Hx   . Stomach cancer Neg Hx     Social History Social History   Tobacco Use  . Smoking status: Never Smoker  . Smokeless tobacco: Never Used  Vaping Use  . Vaping Use: Never used  Substance Use Topics  . Alcohol use: Yes    Alcohol/week: 1.0 standard drink    Types: 1 Glasses of wine per week    Comment: occasionally  . Drug use: No     Allergies   Milk-related compounds, Onion, and  Other   Review of Systems Review of Systems No sore throat + cough + sneezing No pleuritic pain No wheezing + nasal congestion + post-nasal drainage + sinus pain/pressure No itchy/red eyes No earache No hemoptysis No SOB No fever/chills No nausea No vomiting No abdominal pain No diarrhea No urinary symptoms No skin rash + fatigue No myalgias + headache   Physical Exam Triage Vital Signs ED Triage Vitals  Enc Vitals Group     BP 10/30/20 1312 (!) 145/82     Pulse Rate 10/30/20 1312 82     Resp 10/30/20 1312 17     Temp 10/30/20 1312 98.8 F (37.1 C)     Temp Source 10/30/20 1312 Oral     SpO2 10/30/20 1312 97 %     Weight 10/30/20 1315 146 lb (66.2 kg)     Height 10/30/20 1315 5\' 1"  (1.549 m)     Head Circumference --      Peak Flow --      Pain Score 10/30/20 1315 5     Pain Loc --      Pain Edu? --      Excl. in [place]? --    No data found.  Updated Vital Signs BP (!) 145/82 (BP Location: Right Arm)   Pulse 82   Temp 98.8 F (37.1 C) (Oral)   Resp 17   Ht 5\' 1"  (1.549 m)   Wt 66.2 kg   SpO2 97%   BMI 27.59 kg/m   Visual Acuity Right Eye Distance:   Left Eye Distance:   Bilateral Distance:    Right Eye Near:   Left Eye Near:    Bilateral Near:     Physical Exam Nursing notes and Vital Signs reviewed. Appearance:  Patient appears stated age, and in no acute distress Eyes:  Pupils are equal, round, and reactive to light and accomodation.  Extraocular movement is intact.  Conjunctivae are not inflamed  Ears:  Canals normal.  Tympanic membranes normal.  Nose:  Mildly congested turbinates.  No sinus tenderness.  Mouth.  Sutures visible left upper posterior gingiva:  No swelling or drainage noted.  Pharynx:  Normal Neck:  Supple.  Mildly enlarged lateral nodes are present, tender to palpation on the left.   Lungs:  Clear to auscultation.  Breath sounds are equal.  Moving air well. Heart:  Regular rate and rhythm without murmurs, rubs, or  gallops.  Abdomen:  Nontender without masses or hepatosplenomegaly.  Bowel sounds are present.  No CVA or flank tenderness.  Extremities:  No edema.  Skin:  No rash present.   UC Treatments / Results  Labs (all labs ordered are listed, but only abnormal results are displayed) Labs Reviewed  COVID-19, FLU A+B NAA    EKG   Radiology No results found.  Procedures Procedures (including critical care time)  Medications Ordered in UC Medications - No data to display  Initial Impression / Assessment and Plan / UC Course  I have reviewed the triage vital signs and the nursing notes.  Pertinent labs & imaging results that were available during my care of the patient were reviewed by me and considered in my medical decision making (see chart for details).    Benign exam.  There is no evidence of bacterial infection today.  Treat symptomatically for now. COVID19 PCR and Flu A/B pending. Followup with Family Doctor if not improved in one week.    Final Clinical Impressions(s) / UC Diagnoses   Final diagnoses:  Acute upper respiratory infection     Discharge Instructions     Take plain guaifenesin (1200mg  extended release tabs such as Mucinex) twice daily, with plenty of water, for cough and congestion. Get adequate rest.   May use Afrin nasal spray (or generic oxymetazoline) each morning for about 5 days and then discontinue.  Also recommend using saline nasal spray several times daily and saline nasal irrigation (AYR is a common brand).  Use Flonase nasal spray each morning after using Afrin nasal spray and saline nasal irrigation. Try warm salt water gargles for sore throat.  Stop all antihistamines (Zyrtec, etc) for now, and other non-prescription cough/cold preparations. May take Tylenol as needed for fever, headache, etc. Begin amoxicillin if not improving about one week or if persistent facial pain and fever develop (Given a prescription to hold, with an expiration date).       May take Delsym Cough Suppressant ("12 Hour Cough Relief") at bedtime for nighttime cough.   If your COVID-19 test is positive, isolate yourself for five days from the time of your symptom onset.  At the end of five days you may end isolation if your symptoms have cleared or improved, and you have not had a fever for 24 hours. At this time you should wear a mask for five more days when you are around others.          ED Prescriptions    None        Kandra Nicolas, MD 10/31/20 1539

## 2020-10-30 NOTE — Telephone Encounter (Signed)
Rx written for amoxicillin

## 2020-10-30 NOTE — Telephone Encounter (Signed)
Patient called regarding her prescription for Amoxil.  According to AVS a prescription for Amoxil needed to be sent to pharmacy.  Dr Assunta Found is sending prescription and patient has been notified.  Patient voices understanding.

## 2020-11-02 ENCOUNTER — Other Ambulatory Visit: Payer: Self-pay

## 2020-11-02 LAB — COVID-19, FLU A+B NAA
Influenza A, NAA: NOT DETECTED
Influenza B, NAA: NOT DETECTED
SARS-CoV-2, NAA: DETECTED — AB

## 2020-11-18 ENCOUNTER — Other Ambulatory Visit: Payer: Self-pay | Admitting: Internal Medicine

## 2020-11-24 ENCOUNTER — Ambulatory Visit (INDEPENDENT_AMBULATORY_CARE_PROVIDER_SITE_OTHER): Payer: Medicare Other

## 2020-11-24 ENCOUNTER — Other Ambulatory Visit: Payer: Self-pay

## 2020-11-24 VITALS — BP 134/78 | HR 79 | Temp 97.6°F | Wt 156.1 lb

## 2020-11-24 DIAGNOSIS — Z0000 Encounter for general adult medical examination without abnormal findings: Secondary | ICD-10-CM

## 2020-11-24 NOTE — Patient Instructions (Addendum)
Ms. [name] [name] , Thank you for taking time to come for your Medicare Wellness Visit. I appreciate your ongoing commitment to your health goals. Please review the following plan we discussed and let me know if I can assist you in the future.   Screening recommendations/referrals: Colonoscopy: No longer required  Mammogram: Done 08/14/20 Bone Density: Done 05/25/20 Recommended yearly ophthalmology/optometry visit for glaucoma screening and checkup Recommended yearly dental visit for hygiene and checkup  Vaccinations: Influenza vaccine: Up to date Pneumococcal vaccine: Up to date Tdap vaccine: Up to date Shingles vaccine: Shingrix discussed. Please contact your pharmacy for coverage information.    Covid-19:Completed 1/15, 2/6 & 04/08/20  Advanced directives: Please bring a copy of your health care power of attorney and living will to the office at your convenience.  Conditions/risks identified: Get back to gym  Next appointment: Follow up in one year for your annual wellness visit    Preventive Care 65 Years and Older, Female Preventive care refers to lifestyle choices and visits with your health care provider that can promote health and wellness. What does preventive care include?  A yearly physical exam. This is also called an annual well check.  Dental exams once or twice a year.  Routine eye exams. Ask your health care provider how often you should have your eyes checked.  Personal lifestyle choices, including:  Daily care of your teeth and gums.  Regular physical activity.  Eating a healthy diet.  Avoiding tobacco and drug use.  Limiting alcohol use.  Practicing safe sex.  Taking low-dose aspirin every day.  Taking vitamin and mineral supplements as recommended by your health care provider. What happens during an annual well check? The services and screenings done by your health care provider during your annual well check will depend on your age, overall health,  lifestyle risk factors, and family history of disease. Counseling  Your health care provider may ask you questions about your:  Alcohol use.  Tobacco use.  Drug use.  Emotional well-being.  Home and relationship well-being.  Sexual activity.  Eating habits.  History of falls.  Memory and ability to understand (cognition).  Work and work Statistician.  Reproductive health. Screening  You may have the following tests or measurements:  Height, weight, and BMI.  Blood pressure.  Lipid and cholesterol levels. These may be checked every 5 years, or more frequently if you are over 63 years old.  Skin check.  Lung cancer screening. You may have this screening every year starting at age 59 if you have a 30-pack-year history of smoking and currently smoke or have quit within the past 15 years.  Fecal occult blood test (FOBT) of the stool. You may have this test every year starting at age 26.  Flexible sigmoidoscopy or colonoscopy. You may have a sigmoidoscopy every 5 years or a colonoscopy every 10 years starting at age 13.  Hepatitis C blood test.  Hepatitis B blood test.  Sexually transmitted disease (STD) testing.  Diabetes screening. This is done by checking your blood sugar (glucose) after you have not eaten for a while (fasting). You may have this done every 1-3 years.  Bone density scan. This is done to screen for osteoporosis. You may have this done starting at age 69.  Mammogram. This may be done every 1-2 years. Talk to your health care provider about how often you should have regular mammograms. Talk with your health care provider about your test results, treatment options, and if necessary, the need for  more tests. Vaccines  Your health care provider may recommend certain vaccines, such as:  Influenza vaccine. This is recommended every year.  Tetanus, diphtheria, and acellular pertussis (Tdap, Td) vaccine. You may need a Td booster every 10 years.  Zoster  vaccine. You may need this after age 62.  Pneumococcal 13-valent conjugate (PCV13) vaccine. One dose is recommended after age 79.  Pneumococcal polysaccharide (PPSV23) vaccine. One dose is recommended after age 70. Talk to your health care provider about which screenings and vaccines you need and how often you need them. This information is not intended to replace advice given to you by your health care provider. Make sure you discuss any questions you have with your health care provider. Document Released: 07/10/2015 Document Revised: 03/02/2016 Document Reviewed: 04/14/2015 Elsevier Interactive Patient Education  2017 [place] Prevention in the Home Falls can cause injuries. They can happen to people of all ages. There are many things you can do to make your home safe and to help prevent falls. What can I do on the outside of my home?  Regularly fix the edges of walkways and driveways and fix any cracks.  Remove anything that might make you trip as you walk through a door, such as a raised step or threshold.  Trim any bushes or trees on the path to your home.  Use bright outdoor lighting.  Clear any walking paths of anything that might make someone trip, such as rocks or tools.  Regularly check to see if handrails are loose or broken. Make sure that both sides of any steps have handrails.  Any raised decks and porches should have guardrails on the edges.  Have any leaves, snow, or ice cleared regularly.  Use sand or salt on walking paths during winter.  Clean up any spills in your garage right away. This includes oil or grease spills. What can I do in the bathroom?  Use night lights.  Install grab bars by the toilet and in the tub and shower. Do not use towel bars as grab bars.  Use non-skid mats or decals in the tub or shower.  If you need to sit down in the shower, use a plastic, non-slip stool.  Keep the floor dry. Clean up any water that spills on the  floor as soon as it happens.  Remove soap buildup in the tub or shower regularly.  Attach bath mats securely with double-sided non-slip rug tape.  Do not have throw rugs and other things on the floor that can make you trip. What can I do in the bedroom?  Use night lights.  Make sure that you have a light by your bed that is easy to reach.  Do not use any sheets or blankets that are too big for your bed. They should not hang down onto the floor.  Have a firm chair that has side arms. You can use this for support while you get dressed.  Do not have throw rugs and other things on the floor that can make you trip. What can I do in the kitchen?  Clean up any spills right away.  Avoid walking on wet floors.  Keep items that you use a lot in easy-to-reach places.  If you need to reach something above you, use a strong step stool that has a grab bar.  Keep electrical cords out of the way.  Do not use floor polish or wax that makes floors slippery. If you must use wax, use non-skid  floor wax.  Do not have throw rugs and other things on the floor that can make you trip. What can I do with my stairs?  Do not leave any items on the stairs.  Make sure that there are handrails on both sides of the stairs and use them. Fix handrails that are broken or loose. Make sure that handrails are as long as the stairways.  Check any carpeting to make sure that it is firmly attached to the stairs. Fix any carpet that is loose or worn.  Avoid having throw rugs at the top or bottom of the stairs. If you do have throw rugs, attach them to the floor with carpet tape.  Make sure that you have a light switch at the top of the stairs and the bottom of the stairs. If you do not have them, ask someone to add them for you. What else can I do to help prevent falls?  Wear shoes that:  Do not have high heels.  Have rubber bottoms.  Are comfortable and fit you well.  Are closed at the toe. Do not wear  sandals.  If you use a stepladder:  Make sure that it is fully opened. Do not climb a closed stepladder.  Make sure that both sides of the stepladder are locked into place.  Ask someone to hold it for you, if possible.  Clearly mark and make sure that you can see:  Any grab bars or handrails.  First and last steps.  Where the edge of each step is.  Use tools that help you move around (mobility aids) if they are needed. These include:  Canes.  Walkers.  Scooters.  Crutches.  Turn on the lights when you go into a dark area. Replace any light bulbs as soon as they burn out.  Set up your furniture so you have a clear path. Avoid moving your furniture around.  If any of your floors are uneven, fix them.  If there are any pets around you, be aware of where they are.  Review your medicines with your doctor. Some medicines can make you feel dizzy. This can increase your chance of falling. Ask your doctor what other things that you can do to help prevent falls. This information is not intended to replace advice given to you by your health care provider. Make sure you discuss any questions you have with your health care provider. Document Released: 04/09/2009 Document Revised: 11/19/2015 Document Reviewed: 07/18/2014 Elsevier Interactive Patient Education  2017 [employer].

## 2020-11-24 NOTE — Progress Notes (Addendum)
Subjective:   [name] [name] is a 80 y.o. female who presents for Medicare Annual (Subsequent) preventive examination.  Review of Systems     Cardiac Risk Factors include: advanced age (>44men, >77 women)     Objective:    Today's Vitals   11/24/20 1436  BP: 134/78  Pulse: 79  Temp: 97.6 F (36.4 C)  SpO2: 96%  Weight: 156 lb 1.6 oz (70.8 kg)   Body mass index is 29.49 kg/m.  Advanced Directives 11/24/2020 09/27/2017  Does Patient Have a Medical Advance Directive? Yes Yes  Type of Advance Directive Gibson in Chart? No - copy requested -    Current Medications (verified) Outpatient Encounter Medications as of 11/24/2020  Medication Sig  . b complex vitamins tablet Take 1 tablet by mouth daily.  Marland Kitchen CALCIUM PO Take 1,200 mg by mouth daily.   . cholecalciferol (VITAMIN D) 1000 UNITS tablet Take 1,000 Units by mouth every morning.  . denosumab (PROLIA) 60 MG/ML SOLN injection Inject 60 mg into the skin every 6 (six) months. Administer in upper arm, thigh, or abdomen  . fluticasone (FLONASE) 50 MCG/ACT nasal spray 1 or 2 sprays each nostril twice a day  . levothyroxine (SYNTHROID) 100 MCG tablet TAKE 1 TABLET(100 MCG) BY MOUTH DAILY  . pantoprazole (PROTONIX) 20 MG tablet TAKE 1 TABLET(20 MG) BY MOUTH DAILY  . chlorhexidine (PERIDEX) 0.12 % solution SMARTSIG:By Mouth (Patient not taking: Reported on 11/24/2020)  . [DISCONTINUED] amoxicillin (AMOXIL) 875 MG tablet Take 1 tablet (875 mg total) by mouth 2 (two) times daily. (Patient not taking: Reported on 11/24/2020)  . [DISCONTINUED] cetirizine (ZYRTEC) 10 MG tablet Take 10 mg by mouth as needed for allergies. (Patient not taking: No sig reported)   No facility-administered encounter medications on file as of 11/24/2020.    Allergies (verified) Milk-related compounds, Onion, and Other   History: Past Medical History:  Diagnosis Date  . COVID 10/25/2020  . Dog bite  of thigh 04/09/2012  . Elevated lipids   . GERD (gastroesophageal reflux disease)   . Hypothyroidism    post thyroidectomy  . Osteopenia    t -2.5 hip in 2005 by Dexa  . Osteoporosis    commpr fx after dog related fall  injury  . Seasonal rhinitis    spring and fall  . Skin cancer    squamous cell ca chest  . Vaginal prolapse    hs corrected   Past Surgical History:  Procedure Laterality Date  . ABDOMINAL HYSTERECTOMY  07/2007  . bladders sling surgery  07/2007  . COLONOSCOPY  2006  . THYROIDECTOMY     Family History  Problem Relation Age of Onset  . Heart attack Mother   . Alcohol abuse Mother   . Kidney cancer Father   . Hypertension Maternal Grandmother   . Esophageal cancer Neg Hx   . Colon cancer Neg Hx   . Pancreatic cancer Neg Hx   . Stomach cancer Neg Hx    Social History   Socioeconomic History  . Marital status: Married    Spouse name: Not on file  . Number of children: Not on file  . Years of education: Not on file  . Highest education level: Not on file  Occupational History  . Not on file  Tobacco Use  . Smoking status: Never Smoker  . Smokeless tobacco: Never Used  Vaping Use  . Vaping Use: Never used  Substance and  Sexual Activity  . Alcohol use: Yes    Alcohol/week: 1.0 standard drink    Types: 1 Glasses of wine per week    Comment: occasionally  . Drug use: No  . Sexual activity: Not Currently    Comment: 1st itnercourse- 19, partners- 2, married- 86 yrs   Other Topics Concern  . 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 Difficulty of Paying Living Expenses: Not hard at all  Food Insecurity: No Food Insecurity  . Worried About Charity fundraiser in the Last Year: Never true  . Ran Out of Food in the Last Year: Never true   Transportation Needs: No Transportation Needs  . Lack of Transportation (Medical): No  . Lack of Transportation (Non-Medical): No  Physical Activity: Sufficiently Active  . Days of Exercise per Week: 5 days  . Minutes of Exercise per Session: 60 min  Stress: No Stress Concern Present  . Feeling of Stress : Not at all  Social Connections: Moderately Integrated  . Frequency of Communication with Friends and Family: More than three times a week  . Frequency of Social Gatherings with Friends and Family: More than three times a week  . Attends Religious Services: More than 4 times per year  . Active Member of Clubs or Organizations: Yes  . Attends Archivist Meetings: 1 to 4 times per year  . Marital Status: Widowed    Tobacco Counseling Counseling given: Not Answered   Clinical Intake:  Pre-visit preparation completed: Yes  Pain : No/denies pain     BMI - recorded: 29.49 Nutritional Status: BMI 25 -29 Overweight Nutritional Risks: None Diabetes: No  How often do you need to have someone help you when you read instructions, pamphlets, or other written materials from your doctor or pharmacy?: 1 - Never  Diabetic?No  Interpreter Needed?: No  Information entered by :: Charlott Rakes, LPN   Activities of Daily Living In your present state of health, do you have any difficulty performing the following activities: 11/24/2020  Hearing? N  Vision? N  Difficulty concentrating or making decisions? N  Walking or climbing stairs? N  Dressing or bathing? N  Doing errands, shopping? N  Preparing Food and eating ? N  Using the Toilet? N  In the past six months, have you accidently leaked urine? N  Do you have problems with loss of bowel control? N  Managing your Medications? N  Managing your Finances? N  Housekeeping or managing your Housekeeping? N  Some recent data might be hidden    Patient Care Team: Panosh, Standley Brooking, MD as PCP - General Princess Bruins, MD  as Attending Physician (Obstetrics and Gynecology) Addison Lank, MD as Referring Physician (Dermatology) freeman rex  Glennis Brink, [place]ert, [facility] as Pharmacist (Pharmacist) Princess Bruins, MD as Consulting Physician (Obstetrics and Gynecology)  Indicate any recent Medical Services you may have received from other than [facility] providers in the past year (date may be approximate).     Assessment:   This is a routine wellness examination for [place].  Hearing/Vision screen  Hearing Screening   125Hz  250Hz  500Hz  1000Hz  2000Hz  3000Hz  4000Hz  6000Hz  8000Hz   Right ear:  Left ear:           Comments: Pt denies any hearing issues   Vision Screening Comments: Pt follows up with [facility] annually   Dietary issues and exercise activities discussed: Current Exercise Habits: Home exercise routine, Type of exercise: walking, Time (Minutes): 45, Frequency (Times/Week): 5, Weekly Exercise (Minutes/Week): 225  Goals Addressed            This Visit's Progress   . Patient Stated       Get back to the gym      Depression Screen PHQ 2/9 Scores 11/24/2020 09/23/2020 05/14/2019 05/11/2018 09/27/2017 03/31/2017 12/18/2015  PHQ - 2 Score 1 0 0 0 0 0 0    Fall Risk Fall Risk  11/24/2020 09/23/2020 05/14/2019 05/11/2018 09/27/2017  Falls in the past year? 0 0 0 0 Yes  Comment - - - - -  Number falls in past yr: 0 0 0 - 1  Injury with Fall? 0 0 0 - Yes  Risk for fall due to : Impaired vision - - - -  Follow up Falls prevention discussed - Falls evaluation completed - Education provided    FALL RISK PREVENTION PERTAINING TO THE HOME:  Any stairs in or around the home? Yes  If so, are there any without handrails? No  Home free of loose throw rugs in walkways, pet beds, electrical cords, etc? Yes  Adequate lighting in your home to reduce risk of falls? Yes   ASSISTIVE DEVICES UTILIZED TO PREVENT FALLS:  Life alert? No  Use of a cane, walker or w/c? No  Grab bars in the bathroom? No   Shower chair or bench in shower? Yes built in Elevated toilet seat or a handicapped toilet? No   TIMED UP AND GO:  Was the test performed? Yes .  Length of time to ambulate 10 feet: 10 sec.   Gait steady and fast without use of assistive device  Cognitive Function: MMSE - Mini Mental State Exam 09/27/2017  Not completed: (No Data)     6CIT Screen 11/24/2020  What Year? 0 points  What month? 0 points  What time? 0 points  Count back from 20 0 points  Months in reverse 0 points  Repeat phrase 0 points  Total Score 0    Immunizations Immunization History  Administered Date(s) Administered  . Fluad Quad(high Dose 65+) 04/21/2020  . Influenza Split 03/08/2011, 03/20/2012  . Influenza Whole 05/09/2007, 03/31/2008, 03/19/2010  . Influenza, High Dose Seasonal PF 03/18/2016, 03/21/2017, 04/03/2018, 02/14/2019  . Influenza,inj,Quad PF,6+ Mos 04/04/2014  . PFIZER(Purple Top)SARS-COV-2 Vaccination 07/12/2019, 08/03/2019, 04/08/2020  . Pneumococcal Conjugate-13 10/16/2013  . Pneumococcal Polysaccharide-23 06/27/2001, 09/13/2007, 01/15/2009  . Rabies, IM 11/11/2015, 11/18/2015, 11/22/2015, 11/29/2015  . Td 12/27/2006  . Tdap 11/11/2015  . Zoster, Live 03/31/2010    TDAP status: Up to date  Flu Vaccine status: Up to date  Pneumococcal vaccine status: Up to date  Covid-19 vaccine status: Completed vaccines  Qualifies for Shingles Vaccine? Yes   Zostavax completed Yes   Shingrix Completed?: No.    Education has been provided regarding the importance of this vaccine. Patient has been advised to call insurance company to determine out of pocket expense if they have not yet received this vaccine. Advised may also receive vaccine at local pharmacy or Health Dept. Verbalized acceptance and understanding.  Screening Tests Health Maintenance  Topic Date Due  . Hepatitis C Screening  Never done  . Zoster Vaccines- Shingrix (1 of  2) Never done  . COVID-19 Vaccine (4 - Booster for  [place] series) 07/09/2020  . INFLUENZA VACCINE  01/25/2021  . TETANUS/TDAP  11/10/2025  . DEXA SCAN  Completed  . PNA vac Low Risk Adult  Completed  . HPV VACCINES  Aged Out    Health Maintenance  Health Maintenance Due  Topic Date Due  . Hepatitis C Screening  Never done  . Zoster Vaccines- Shingrix (1 of 2) Never done  . COVID-19 Vaccine (4 - Booster for Pfizer series) 07/09/2020    Colorectal cancer screening: No longer required.   Mammogram status: Completed 08/14/20. Repeat every year  Bone Density status: Completed 05/25/20. Results reflect: Bone density results: OSTEOPENIA. Repeat every 2-3 years.  Additional Screening:  Hepatitis C Screening: does qualify;   Vision Screening: Recommended annual ophthalmology exams for early detection of glaucoma and other disorders of the eye. Is the patient up to date with their annual eye exam?  Yes  Who is the provider or what is the name of the office in which the patient attends annual eye exams? [facility] If pt is not established with a provider, would they like to be referred to a provider to establish care? No .   Dental Screening: Recommended annual dental exams for proper oral hygiene  Community Resource Referral / Chronic Care Management: CRR required this visit?  No   CCM required this visit?  No      Plan:     I have personally reviewed and noted the following in the patient's chart:   . Medical and social history . Use of alcohol, tobacco or illicit drugs  . Current medications and supplements including opioid prescriptions.  . Functional ability and status . Nutritional status . Physical activity . Advanced directives . List of other physicians . Hospitalizations, surgeries, and ER visits in previous 12 months . Vitals . Screenings to include cognitive, depression, and falls . Referrals and appointments  In addition, I have reviewed and discussed with patient certain preventive protocols,  quality metrics, and best practice recommendations. A written personalized care plan for preventive services as well as general preventive health recommendations were provided to patient.     Willette Brace, LPN   10/10/6061   Nurse Notes: None

## 2020-11-25 DIAGNOSIS — M47816 Spondylosis without myelopathy or radiculopathy, lumbar region: Secondary | ICD-10-CM | POA: Diagnosis not present

## 2020-11-25 DIAGNOSIS — M4316 Spondylolisthesis, lumbar region: Secondary | ICD-10-CM | POA: Diagnosis not present

## 2020-12-03 ENCOUNTER — Inpatient Hospital Stay: Admission: RE | Admit: 2020-12-03 | Payer: Self-pay | Source: Ambulatory Visit

## 2020-12-08 DIAGNOSIS — G8929 Other chronic pain: Secondary | ICD-10-CM | POA: Diagnosis not present

## 2020-12-08 DIAGNOSIS — M545 Low back pain, unspecified: Secondary | ICD-10-CM | POA: Diagnosis not present

## 2021-01-04 DIAGNOSIS — M545 Low back pain, unspecified: Secondary | ICD-10-CM | POA: Diagnosis not present

## 2021-01-04 DIAGNOSIS — G8929 Other chronic pain: Secondary | ICD-10-CM | POA: Diagnosis not present

## 2021-01-05 ENCOUNTER — Other Ambulatory Visit: Payer: Self-pay

## 2021-01-05 MED ORDER — LEVOTHYROXINE SODIUM 100 MCG PO TABS: ORAL_TABLET | ORAL | 2 refills | Status: DC

## 2021-01-07 DIAGNOSIS — M47816 Spondylosis without myelopathy or radiculopathy, lumbar region: Secondary | ICD-10-CM | POA: Diagnosis not present

## 2021-01-07 DIAGNOSIS — M47812 Spondylosis without myelopathy or radiculopathy, cervical region: Secondary | ICD-10-CM | POA: Diagnosis not present

## 2021-01-07 DIAGNOSIS — M4316 Spondylolisthesis, lumbar region: Secondary | ICD-10-CM | POA: Diagnosis not present

## 2021-01-12 ENCOUNTER — Other Ambulatory Visit: Payer: Self-pay

## 2021-01-12 ENCOUNTER — Ambulatory Visit (INDEPENDENT_AMBULATORY_CARE_PROVIDER_SITE_OTHER)
Admission: RE | Admit: 2021-01-12 | Discharge: 2021-01-12 | Disposition: A | Discharge status: Home / Self Care | Payer: Self-pay | Source: Ambulatory Visit | Attending: Internal Medicine | Admitting: Internal Medicine

## 2021-01-12 DIAGNOSIS — E785 Hyperlipidemia, unspecified: Secondary | ICD-10-CM

## 2021-01-12 DIAGNOSIS — Z9189 Other specified personal risk factors, not elsewhere classified: Secondary | ICD-10-CM

## 2021-01-15 DIAGNOSIS — Z23 Encounter for immunization: Secondary | ICD-10-CM | POA: Diagnosis not present

## 2021-01-24 NOTE — Progress Notes (Signed)
So  statin is optional  but not compelling  reason at this time.   There was a  small pulm nodule felt to be benign   . Make  appt if you with to discuss these findings

## 2021-02-15 NOTE — Progress Notes (Signed)
Chief Complaint  Patient presents with   Results    HPI: [name] [name] 80 y.o. come in for follow-up coronary artery scan and labs Is getting injections for her back predicament through [facility].  Spondylolisthesis and degenerative disease.  CT coronary shows 25th percentile.  Also a small 3 mm pulmonary nodule incidental felt to be benign but could follow-up in 12 months if felt high risk.  No history of long-term smoking respiratory disease. Family history mother died in her 58 did have a history of heart disease but otherwise no premature vascular disease. ROS: See pertinent positives and negatives per HPI.  Past Medical History:  Diagnosis Date   COVID 10/25/2020   Dog bite of thigh 04/09/2012   Elevated lipids    GERD (gastroesophageal reflux disease)    Hypothyroidism    post thyroidectomy   Osteopenia    t -2.5 hip in 2005 by Dexa   Osteoporosis    commpr fx after dog related fall  injury   Seasonal rhinitis    spring and fall   Skin cancer    squamous cell ca chest   Vaginal prolapse    hs corrected    Family History  Problem Relation Age of Onset   Heart attack Mother    Alcohol abuse Mother    Kidney cancer Father    Hypertension Maternal Grandmother    Esophageal cancer Neg Hx    Colon cancer Neg Hx    Pancreatic cancer Neg Hx    Stomach cancer Neg Hx     Social History   Socioeconomic History   Marital status: Married    Spouse name: Not on file   Number of children: Not on file   Years of education: Not on file   Highest education level: Not on file  Occupational History   Not on file  Tobacco Use   Smoking status: Never   Smokeless tobacco: Never  Vaping Use   Vaping Use: Never used  Substance and Sexual Activity   Alcohol use: Yes    Alcohol/week: 1.0 standard drink    Types: 1 Glasses of wine per week    Comment: occasionally   Drug use: No   Sexual activity: Not Currently    Comment: 1st itnercourse- 19, partners- 2, married- 55  yrs   Other Topics Concern   Not on file  Social History Narrative   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   Regular Exercise- yes   Now on rx   Working  Full time        hh of 2 cats and dogs in hh    Social Determinants of Radio broadcast assistant Strain: Low Risk    Difficulty of Paying Living Expenses: Not hard at all  Food Insecurity: No Food Insecurity   Worried About Charity fundraiser in the Last Year: Never true   Arboriculturist in the Last Year: Never true  Transportation Needs: No Transportation Needs   Lack of Transportation (Medical): No   Lack of Transportation (Non-Medical): No  Physical Activity: Sufficiently Active   Days of Exercise per Week: 5 days   Minutes of Exercise per Session: 60 min  Stress: No Stress Concern Present   Feeling of Stress : Not at all  Social Connections: Moderately Integrated   Frequency of Communication with Friends and Family: More than three times a week   Frequency of Social Gatherings with Friends and Family: More than three times a  week   Attends Religious Services: More than 4 times per year   Active Member of Clubs or Organizations: Yes   Attends Archivist Meetings: 1 to 4 times per year   Marital Status: Widowed    Outpatient Medications Prior to Visit  Medication Sig Dispense Refill   b complex vitamins tablet Take 1 tablet by mouth daily.     CALCIUM PO Take 1,200 mg by mouth daily.      chlorhexidine (PERIDEX) 0.12 % solution      cholecalciferol (VITAMIN D) 1000 UNITS tablet Take 1,000 Units by mouth every morning.     denosumab (PROLIA) 60 MG/ML SOLN injection Inject 60 mg into the skin every 6 (six) months. Administer in upper arm, thigh, or abdomen     fluticasone (FLONASE) 50 MCG/ACT nasal spray 1 or 2 sprays each nostril twice a day 16 g 0   levothyroxine (SYNTHROID) 100 MCG tablet TAKE 1 TABLET(100 MCG) BY MOUTH DAILY 90 tablet 2   pantoprazole (PROTONIX) 20 MG tablet TAKE 1 TABLET(20 MG) BY MOUTH  DAILY 90 tablet 0   No facility-administered medications prior to visit.     EXAM:  BP 116/72 (BP Location: Left Arm, Patient Position: Sitting, Cuff Size: Normal)   Pulse 75   Temp 98.5 F (36.9 C) (Oral)   Ht '5\' 1"'$  (1.549 m)   Wt 158 lb (71.7 kg)   SpO2 97%   BMI 29.85 kg/m   Body mass index is 29.85 kg/m.  GENERAL: vitals reviewed and listed above, alert, oriented, appears well hydrated and in no acute distress HEENT: atraumatic, conjunctiva  clear, no obvious abnormalities on inspection of external nose and ears OP : Masked NECK: no obvious masses on inspection palpation  LUNGS: clear to auscultation bilaterally, no wheezes, rales or rhonchi, good air movement kyphosis CV: HRRR, no clubbing cyanosis  MS: moves all extremities without noticeable focal  abnormality kyphosis PSYCH: pleasant and cooperative, no obvious depression or anxiety cognition intact Lab Results  Component Value Date   WBC 6.4 09/23/2020   HGB 12.7 09/23/2020   HCT 38.3 09/23/2020   PLT 276.0 09/23/2020   GLUCOSE 94 09/23/2020   CHOL 186 09/23/2020   TRIG 64.0 09/23/2020   HDL 61.10 09/23/2020   LDLDIRECT 134.1 12/27/2006   LDLCALC 112 (H) 09/23/2020   ALT 18 09/23/2020   AST 26 09/23/2020   NA 142 09/23/2020   K 4.0 09/23/2020   CL 106 09/23/2020   CREATININE 0.76 09/23/2020   BUN 26 (H) 09/23/2020   CO2 28 09/23/2020   TSH 1.25 09/23/2020   INR 1.0 07/25/2007   HGBA1C 5.8 09/23/2020   BP Readings from Last 3 Encounters:  02/16/21 116/72  11/24/20 134/78  10/30/20 (!) 145/82    ASSESSMENT AND PLAN:  Discussed the following assessment and plan:  Medication management  Cardiovascular risk factor  Hypothyroidism, unspecified type  Age-related osteoporosis without current pathological fracture  Pulmonary nodule - 3 mm small left  indicental   Hyperlipidemia, unspecified hyperlipidemia type Discussed options statin medicine however not high risk based on age and negative  family history of early disease. Attention to bone health and her spinal disease at this time.  Continue healthy lifestyle she can change her mind at any time and we can add medication. -Patient advised to return or notify health care team  if  new concerns arise.  Patient Instructions  Continue lifestyle intervention healthy eating and exercise .  We can revisit   the  option to do repeat ct scan for small  pulmonary nodule   .  Probably benign.  Same thyroid dose.   Attention to bone  health as you are doing.   Plan    March   2023 next year.      Standley Brooking. Kiyoko Mcguirt M.D.

## 2021-02-16 ENCOUNTER — Encounter: Payer: Self-pay | Admitting: Internal Medicine

## 2021-02-16 ENCOUNTER — Ambulatory Visit (INDEPENDENT_AMBULATORY_CARE_PROVIDER_SITE_OTHER): Payer: Medicare Other | Admitting: Internal Medicine

## 2021-02-16 ENCOUNTER — Other Ambulatory Visit: Payer: Self-pay

## 2021-02-16 VITALS — BP 116/72 | HR 75 | Temp 98.5°F | Ht 61.0 in | Wt 158.0 lb

## 2021-02-16 DIAGNOSIS — L821 Other seborrheic keratosis: Secondary | ICD-10-CM | POA: Diagnosis not present

## 2021-02-16 DIAGNOSIS — R911 Solitary pulmonary nodule: Secondary | ICD-10-CM

## 2021-02-16 DIAGNOSIS — L853 Xerosis cutis: Secondary | ICD-10-CM | POA: Diagnosis not present

## 2021-02-16 DIAGNOSIS — Z79899 Other long term (current) drug therapy: Secondary | ICD-10-CM | POA: Diagnosis not present

## 2021-02-16 DIAGNOSIS — M810 Age-related osteoporosis without current pathological fracture: Secondary | ICD-10-CM | POA: Diagnosis not present

## 2021-02-16 DIAGNOSIS — E785 Hyperlipidemia, unspecified: Secondary | ICD-10-CM | POA: Diagnosis not present

## 2021-02-16 DIAGNOSIS — Z9189 Other specified personal risk factors, not elsewhere classified: Secondary | ICD-10-CM

## 2021-02-16 DIAGNOSIS — D1801 Hemangioma of skin and subcutaneous tissue: Secondary | ICD-10-CM | POA: Diagnosis not present

## 2021-02-16 DIAGNOSIS — L579 Skin changes due to chronic exposure to nonionizing radiation, unspecified: Secondary | ICD-10-CM | POA: Diagnosis not present

## 2021-02-16 DIAGNOSIS — D692 Other nonthrombocytopenic purpura: Secondary | ICD-10-CM | POA: Diagnosis not present

## 2021-02-16 DIAGNOSIS — L905 Scar conditions and fibrosis of skin: Secondary | ICD-10-CM | POA: Diagnosis not present

## 2021-02-16 DIAGNOSIS — E039 Hypothyroidism, unspecified: Secondary | ICD-10-CM | POA: Diagnosis not present

## 2021-02-16 DIAGNOSIS — Z85828 Personal history of other malignant neoplasm of skin: Secondary | ICD-10-CM | POA: Diagnosis not present

## 2021-02-16 NOTE — Patient Instructions (Addendum)
Continue lifestyle intervention healthy eating and exercise .  We can revisit   the option to do repeat ct scan for small  pulmonary nodule   .  Probably benign.  Same thyroid dose.   Attention to bone  health as you are doing.   Plan    March   2023 next year.

## 2021-02-20 ENCOUNTER — Other Ambulatory Visit: Payer: Self-pay | Admitting: Internal Medicine

## 2021-03-05 ENCOUNTER — Encounter: Payer: Self-pay | Admitting: Obstetrics & Gynecology

## 2021-03-05 ENCOUNTER — Ambulatory Visit (INDEPENDENT_AMBULATORY_CARE_PROVIDER_SITE_OTHER): Payer: Medicare Other | Admitting: Obstetrics & Gynecology

## 2021-03-05 ENCOUNTER — Other Ambulatory Visit: Payer: Self-pay

## 2021-03-05 VITALS — BP 120/76 | HR 71 | Resp 16 | Ht 59.25 in | Wt 159.0 lb

## 2021-03-05 DIAGNOSIS — Z01419 Encounter for gynecological examination (general) (routine) without abnormal findings: Secondary | ICD-10-CM

## 2021-03-05 DIAGNOSIS — M810 Age-related osteoporosis without current pathological fracture: Secondary | ICD-10-CM

## 2021-03-05 DIAGNOSIS — Z780 Asymptomatic menopausal state: Secondary | ICD-10-CM

## 2021-03-05 DIAGNOSIS — Z90711 Acquired absence of uterus with remaining cervical stump: Secondary | ICD-10-CM

## 2021-03-05 NOTE — Progress Notes (Signed)
[name] [name] 11/26/1940 [id number]   History:    80 y.o. G1P1L1 Widowed, husband passed away 1 yr ago.   RP:  Established patient presenting for annual gyn exam    HPI: H/O Supracervical Hysterectomy/BSO with Sacrocolpopexy and Sling Procedure in 07/2007.  Menopause, well on no HRT x last year.  No pelvic pain.  Abstinent.  No stress urinary incontinence. Bowel movements normal.  Breasts normal.  BMI 31.84.  Very active, walking/doing stairs every day.  On Prolia for osteoporosis.  BD 04/2020.  Health labs with family physician.  Cologuard Neg 2021.  Past medical history,surgical history, family history and social history were all reviewed and documented in the EPIC chart.  Gynecologic History No LMP recorded. Patient has had a hysterectomy.  Obstetric History OB History  Gravida Para Term Preterm AB Living  '1 1       1  '$ SAB IAB Ectopic Multiple Live Births               # Outcome Date GA Lbr Len/2nd Weight Sex Delivery Anes PTL Lv  1 Para              ROS: A ROS was performed and pertinent positives and negatives are included in the history.  GENERAL: No fevers or chills. HEENT: No change in vision, no earache, sore throat or sinus congestion. NECK: No pain or stiffness. CARDIOVASCULAR: No chest pain or pressure. No palpitations. PULMONARY: No shortness of breath, cough or wheeze. GASTROINTESTINAL: No abdominal pain, nausea, vomiting or diarrhea, melena or bright red blood per rectum. GENITOURINARY: No urinary frequency, urgency, hesitancy or dysuria. MUSCULOSKELETAL: No joint or muscle pain, no back pain, no recent trauma. DERMATOLOGIC: No rash, no itching, no lesions. ENDOCRINE: No polyuria, polydipsia, no heat or cold intolerance. No recent change in weight. HEMATOLOGICAL: No anemia or easy bruising or bleeding. NEUROLOGIC: No headache, seizures, numbness, tingling or weakness. PSYCHIATRIC: No depression, no loss of interest in normal activity or change in sleep pattern.      Exam:   BP 120/76   Pulse 71   Resp 16   Ht 4' 11.25" (1.505 m)   Wt 159 lb (72.1 kg)   BMI 31.84 kg/m   Body mass index is 31.84 kg/m.  General appearance : Well developed well nourished female. No acute distress HEENT: Eyes: no retinal hemorrhage or exudates,  Neck supple, trachea midline, no carotid bruits, no thyroidmegaly Lungs: Clear to auscultation, no rhonchi or wheezes, or rib retractions  Heart: Regular rate and rhythm, no murmurs or gallops Breast:Examined in sitting and supine position were symmetrical in appearance, no palpable masses or tenderness,  no skin retraction, no nipple inversion, no nipple discharge, no skin discoloration, no axillary or supraclavicular lymphadenopathy Abdomen: no palpable masses or tenderness, no rebound or guarding Extremities: no edema or skin discoloration or tenderness  Pelvic: Vulva: Normal             Vagina: No gross lesions or discharge  Cervix: No gross lesions or discharge  Uterus absent  Adnexa  Without masses or tenderness  Anus: Normal   Assessment/Plan:  79 y.o. female for annual exam   1. Well female exam with routine gynecological exam Gynecologic exam status post robotic laparoscopic supracervical hysterectomy with sacrocolpopexy.  Last Pap test in 2018 was negative.  Breast exam normal.  Mammogram February 2022 was negative.  Cologuard negative in 2021.  Health labs with family physician.  Body mass index 31.84.  Continue with fitness and  healthy nutrition.  2. Status post laparoscopic supracervical hysterectomy with sacrocolpopexy Good support of the cervix and vaginal walls.  3. Postmenopause Well on no hormone replacement therapy.  Status post supracervical hysterectomy.  No postmenopausal bleeding.  4. Age-related osteoporosis without current pathological fracture  Last bone density November 2021.  On Prolia for osteoporosis.  Continue with weightbearing physical activities on a regular basis, vitamin D  supplements and calcium intake of 1.5 g/day total.  We will repeat a bone density at 2 years.  Princess Bruins MD, 11:13 AM 03/05/2021

## 2021-03-08 DIAGNOSIS — M2011 Hallux valgus (acquired), right foot: Secondary | ICD-10-CM | POA: Diagnosis not present

## 2021-03-08 DIAGNOSIS — M7062 Trochanteric bursitis, left hip: Secondary | ICD-10-CM | POA: Diagnosis not present

## 2021-03-08 DIAGNOSIS — M533 Sacrococcygeal disorders, not elsewhere classified: Secondary | ICD-10-CM | POA: Diagnosis not present

## 2021-03-08 DIAGNOSIS — M205X1 Other deformities of toe(s) (acquired), right foot: Secondary | ICD-10-CM | POA: Diagnosis not present

## 2021-03-08 DIAGNOSIS — M79671 Pain in right foot: Secondary | ICD-10-CM | POA: Diagnosis not present

## 2021-03-08 DIAGNOSIS — M2141 Flat foot [pes planus] (acquired), right foot: Secondary | ICD-10-CM | POA: Diagnosis not present

## 2021-03-08 DIAGNOSIS — M4316 Spondylolisthesis, lumbar region: Secondary | ICD-10-CM | POA: Diagnosis not present

## 2021-03-08 DIAGNOSIS — M2041 Other hammer toe(s) (acquired), right foot: Secondary | ICD-10-CM | POA: Diagnosis not present

## 2021-03-08 DIAGNOSIS — M47812 Spondylosis without myelopathy or radiculopathy, cervical region: Secondary | ICD-10-CM | POA: Diagnosis not present

## 2021-03-08 DIAGNOSIS — M47816 Spondylosis without myelopathy or radiculopathy, lumbar region: Secondary | ICD-10-CM | POA: Diagnosis not present

## 2021-03-24 ENCOUNTER — Telehealth: Payer: Self-pay | Admitting: *Deleted

## 2021-03-24 NOTE — Telephone Encounter (Signed)
Deductible $233 ($233 MET)  OOP MAX N/A  Annual exam 03/05/2021  Calcium  9.7           Date 09/13/2020  Upcoming dental procedures NO  Prior Authorization needed NO  Pt estimated Cost $0  APPT 04/23/2021     Coverage Details: $0 ONE DOSE, $0 ADMIN FEE

## 2021-03-24 NOTE — Telephone Encounter (Signed)
Prolia insurance verification has been sent awaiting Summary of benefits  

## 2021-03-24 NOTE — Telephone Encounter (Signed)
PROLIA GIVEN 10/21/2020 NEXT INJECTION 04/23/2021

## 2021-03-31 DIAGNOSIS — M533 Sacrococcygeal disorders, not elsewhere classified: Secondary | ICD-10-CM | POA: Diagnosis not present

## 2021-03-31 DIAGNOSIS — M549 Dorsalgia, unspecified: Secondary | ICD-10-CM | POA: Diagnosis not present

## 2021-03-31 DIAGNOSIS — G8929 Other chronic pain: Secondary | ICD-10-CM | POA: Diagnosis not present

## 2021-04-05 DIAGNOSIS — L821 Other seborrheic keratosis: Secondary | ICD-10-CM | POA: Diagnosis not present

## 2021-04-05 DIAGNOSIS — I8390 Asymptomatic varicose veins of unspecified lower extremity: Secondary | ICD-10-CM | POA: Diagnosis not present

## 2021-04-05 DIAGNOSIS — Z85828 Personal history of other malignant neoplasm of skin: Secondary | ICD-10-CM | POA: Diagnosis not present

## 2021-04-05 DIAGNOSIS — L905 Scar conditions and fibrosis of skin: Secondary | ICD-10-CM | POA: Diagnosis not present

## 2021-04-23 ENCOUNTER — Ambulatory Visit (INDEPENDENT_AMBULATORY_CARE_PROVIDER_SITE_OTHER): Payer: Medicare Other | Admitting: Gynecology

## 2021-04-23 ENCOUNTER — Other Ambulatory Visit: Payer: Self-pay

## 2021-04-23 DIAGNOSIS — M810 Age-related osteoporosis without current pathological fracture: Secondary | ICD-10-CM

## 2021-04-23 MED ORDER — DENOSUMAB 60 MG/ML ~~LOC~~ SOSY
60.0000 mg | PREFILLED_SYRINGE | Freq: Once | SUBCUTANEOUS | Status: AC
  Administered 2021-04-23: 60 mg via SUBCUTANEOUS

## 2021-04-27 DIAGNOSIS — Z23 Encounter for immunization: Secondary | ICD-10-CM | POA: Diagnosis not present

## 2021-05-04 ENCOUNTER — Other Ambulatory Visit: Payer: Self-pay

## 2021-05-04 ENCOUNTER — Emergency Department
Admission: EM | Admit: 2021-05-04 | Discharge: 2021-05-04 | Disposition: A | Discharge status: Home / Self Care | Payer: Medicare Other | Source: Home / Self Care | Attending: Family Medicine | Admitting: Family Medicine

## 2021-05-04 DIAGNOSIS — M533 Sacrococcygeal disorders, not elsewhere classified: Secondary | ICD-10-CM | POA: Diagnosis not present

## 2021-05-04 DIAGNOSIS — G8929 Other chronic pain: Secondary | ICD-10-CM | POA: Diagnosis not present

## 2021-05-04 DIAGNOSIS — M4316 Spondylolisthesis, lumbar region: Secondary | ICD-10-CM | POA: Diagnosis not present

## 2021-05-04 DIAGNOSIS — J069 Acute upper respiratory infection, unspecified: Secondary | ICD-10-CM

## 2021-05-04 MED ORDER — FLUTICASONE PROPIONATE 50 MCG/ACT NA SUSP: NASAL | 0 refills | Status: DC

## 2021-05-04 NOTE — Discharge Instructions (Signed)
You can try Flonase for your symptoms.  I do not see any evidence of infection that would require an antibiotic.

## 2021-05-04 NOTE — ED Triage Notes (Signed)
Pt presents to Urgent Care with c/o nasal congestion and cough x 2-3 days. No COVID test done; vaccinated against COVID and flu.

## 2021-05-06 ENCOUNTER — Telehealth: Payer: Self-pay | Admitting: Internal Medicine

## 2021-05-06 NOTE — Telephone Encounter (Signed)
Noted  

## 2021-05-06 NOTE — Telephone Encounter (Signed)
Patient calling in with respiratory symptoms: Shortness of breath, chest pain, palpitations or other red words send to Triage  Does the patient have a fever over 100, cough, congestion, sore throat, runny nose, lost of taste/smell within the last 5 days?    Yes: "have you tested for Covid?" [x]   If yes, was it positive []  OR negative [x] ? If positive in the last 5 days, please schedule virtual visit now. If negative, schedule for an in person OV with the next available provider if PCP has no openings. Please also let patient know they will be tested again (follow the script below)  No, patient has not tested for covid: "Do you have access to test from home?" If yes []  please ask them to test from home & notify us of the results to determine if VV or in person visit. If no []  schedule patient for an in person visit. Let them know "you will have to arrive 43mins prior to your appt time to be Covid tested. Please park in back of office at the cone & call [phone] to let the staff know you have arrived. A staff member will meet you at your car to do a rapid covid test. Once the test has resulted you will be notified by phone of your results to determine if appt will remain an in person visit or be converted to a virtual/phone visit."   [place]  If no availability for virtual visit in office,  please schedule another [place] office  If no availability at another [facility] office, please instruct patient that they can schedule an evisit or virtual visit through their mychart account. Visits up to 8pm  patients can be seen in office 5 days after positive COVID test

## 2021-05-07 ENCOUNTER — Ambulatory Visit (INDEPENDENT_AMBULATORY_CARE_PROVIDER_SITE_OTHER): Payer: Medicare Other | Admitting: Family Medicine

## 2021-05-07 VITALS — BP 140/80 | HR 94 | Temp 98.5°F | Wt 160.5 lb

## 2021-05-07 DIAGNOSIS — J069 Acute upper respiratory infection, unspecified: Secondary | ICD-10-CM | POA: Diagnosis not present

## 2021-05-07 DIAGNOSIS — R0981 Nasal congestion: Secondary | ICD-10-CM | POA: Diagnosis not present

## 2021-05-07 LAB — POCT INFLUENZA A/B
Influenza A, POC: NEGATIVE
Influenza B, POC: NEGATIVE

## 2021-05-07 LAB — POC COVID19 BINAXNOW: SARS Coronavirus 2 Ag: NEGATIVE

## 2021-05-07 MED ORDER — AMOXICILLIN-POT CLAVULANATE 875-125 MG PO TABS: 1.0000 | ORAL_TABLET | Freq: Two times a day (BID) | ORAL | 0 refills | Status: DC

## 2021-05-07 NOTE — Progress Notes (Signed)
Established Patient Office Visit  Subjective:  Patient ID: [name] [name], female    DOB: Jan 24, 1941  Age: 80 y.o. MRN: [id number]  CC:  Chief Complaint  Patient presents with   Cough    Productive cough, chest congestion, runny nose, slight sore throat, covid and flu negative     HPI [name] [name] presents for upper respiratory symptoms of hoarseness, sore throat, nasal congestion, and productive cough.  Onset within the past week.  She had COVID test and influenza screen earlier today here which were both negative.  She has been using over-the-counter Flonase without much improvement.  She feels worse than she did it again a week.  No fever.  Pulse oximetry is been stable.  No chronic lung disease.  No history of smoking.  Past Medical History:  Diagnosis Date   COVID 10/25/2020   Dog bite of thigh 04/09/2012   Elevated lipids    GERD (gastroesophageal reflux disease)    Hypothyroidism    post thyroidectomy   Osteoarthritis    Osteopenia    t -2.5 hip in 2005 by Dexa   Osteoporosis    commpr fx after dog related fall  injury   Seasonal rhinitis    spring and fall   Skin cancer    squamous cell ca chest   Vaginal prolapse    hs corrected    Past Surgical History:  Procedure Laterality Date   ABDOMINAL HYSTERECTOMY  07/2007   bladders sling surgery  07/2007   COLONOSCOPY  2006   THYROIDECTOMY      Family History  Problem Relation Age of Onset   Heart attack Mother    Alcohol abuse Mother    Kidney cancer Father    Hypertension Maternal Grandmother    Esophageal cancer Neg Hx    Colon cancer Neg Hx    Pancreatic cancer Neg Hx    Stomach cancer Neg Hx     Social History   Socioeconomic History   Marital status: Widowed    Spouse name: Not on file   Number of children: Not on file   Years of education: Not on file   Highest education level: Associate degree: academic program  Occupational History   Not on file  Tobacco Use   Smoking status: Never    Smokeless tobacco: Never  Vaping Use   Vaping Use: Never used  Substance and Sexual Activity   Alcohol use: Yes    Comment: occasionally   Drug use: No   Sexual activity: Not on file  Other Topics Concern   Not on file  Social History Narrative   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   Regular Exercise- yes   Now on rx   Working  Full time        hh of 2 cats and dogs in hh    Social Determinants of Radio broadcast assistant Strain: Low Risk    Difficulty of Paying Living Expenses: Not hard at all  Food Insecurity: No Food Insecurity   Worried About Charity fundraiser in the Last Year: Never true   Arboriculturist in the Last Year: Never true  Transportation Needs: No Transportation Needs   Lack of Transportation (Medical): No   Lack of Transportation (Non-Medical): No  Physical Activity: Sufficiently Active   Days of Exercise per Week: 5 days   Minutes of Exercise per Session: 60 min  Stress: No Stress Concern Present   Feeling of Stress : Not  at all  Social Connections: Moderately Integrated   Frequency of Communication with Friends and Family: More than three times a week   Frequency of Social Gatherings with Friends and Family: Once a week   Attends Religious Services: More than 4 times per year   Active Member of [employer] or Organizations: Yes   Attends Archivist Meetings: More than 4 times per year   Marital Status: Widowed  Human resources officer Violence: Not At Risk   Fear of Current or Ex-Partner: No   Emotionally Abused: No   Physically Abused: No   Sexually Abused: No    Outpatient Medications Prior to Visit  Medication Sig Dispense Refill   aspirin 325 MG EC tablet Take 325 mg by mouth every 6 (six) hours as needed for pain.     b complex vitamins tablet Take 1 tablet by mouth daily.     CALCIUM PO Take 1,200 mg by mouth daily.      cholecalciferol (VITAMIN D) 1000 UNITS tablet Take 1,000 Units by mouth every morning.     denosumab (PROLIA) 60 MG/ML  SOLN injection Inject 60 mg into the skin every 6 (six) months. Administer in upper arm, thigh, or abdomen     levothyroxine (SYNTHROID) 100 MCG tablet TAKE 1 TABLET(100 MCG) BY MOUTH DAILY 90 tablet 2   pantoprazole (PROTONIX) 20 MG tablet TAKE 1 TABLET(20 MG) BY MOUTH DAILY 90 tablet 1   fluticasone (FLONASE) 50 MCG/ACT nasal spray 1 or 2 sprays each nostril twice a day 16 g 0   No facility-administered medications prior to visit.    Allergies  Allergen Reactions   Milk-Related Compounds Other (See Comments)   Onion Other (See Comments)   Other Other (See Comments)    Green Peppers    ROS Review of Systems  Constitutional:  Positive for fatigue. Negative for chills and fever.  HENT:  Positive for congestion and sore throat.   Respiratory:  Positive for cough.      Objective:    Physical Exam Vitals reviewed.  Constitutional:      Appearance: Normal appearance.  HENT:     Right Ear: Tympanic membrane normal.     Left Ear: Tympanic membrane normal.  Cardiovascular:     Rate and Rhythm: Normal rate and regular rhythm.  Pulmonary:     Effort: Pulmonary effort is normal.     Breath sounds: Normal breath sounds. No wheezing or rales.  Neurological:     Mental Status: She is alert.    BP 140/80 (BP Location: Left Arm, Patient Position: Sitting, Cuff Size: Normal)   Pulse 94   Temp 98.5 F (36.9 C) (Oral)   Wt 160 lb 8 oz (72.8 kg)   SpO2 98%   BMI 31.35 kg/m  Wt Readings from Last 3 Encounters:  05/07/21 160 lb 8 oz (72.8 kg)  05/04/21 149 lb (67.6 kg)  03/05/21 159 lb (72.1 kg)     Health Maintenance Due  Topic Date Due   Hepatitis C Screening  Never done   Zoster Vaccines- Shingrix (1 of 2) Never done   INFLUENZA VACCINE  01/25/2021   COVID-19 Vaccine (5 - Booster for Pfizer series) 03/12/2021    There are no preventive care reminders to display for this patient.  Lab Results  Component Value Date   TSH 1.25 09/23/2020   Lab Results  Component Value  Date   WBC 6.4 09/23/2020   HGB 12.7 09/23/2020   HCT 38.3 09/23/2020   MCV  95.0 09/23/2020   PLT 276.0 09/23/2020   Lab Results  Component Value Date   NA 142 09/23/2020   K 4.0 09/23/2020   CO2 28 09/23/2020   GLUCOSE 94 09/23/2020   BUN 26 (H) 09/23/2020   CREATININE 0.76 09/23/2020   BILITOT 1.3 (H) 09/23/2020   ALKPHOS 34 (L) 09/23/2020   AST 26 09/23/2020   ALT 18 09/23/2020   PROT 6.7 09/23/2020   ALBUMIN 4.4 09/23/2020   CALCIUM 9.7 09/23/2020   GFR 74.68 09/23/2020   Lab Results  Component Value Date   CHOL 186 09/23/2020   Lab Results  Component Value Date   HDL 61.10 09/23/2020   Lab Results  Component Value Date   LDLCALC 112 (H) 09/23/2020   Lab Results  Component Value Date   TRIG 64.0 09/23/2020   Lab Results  Component Value Date   CHOLHDL 3 09/23/2020   Lab Results  Component Value Date   HGBA1C 5.8 09/23/2020      Assessment & Plan:   Upper respiratory infection with cough.  We explained to patient we think this is probably viral.  Influenza screen and COVID screen negative.  Nonfocal lung exam.  -We have recommend observation at this point.  She is very concerned about her need for antibiotic.  We did agree to write prescription for Augmentin 875 mg 1 twice daily for 7 days if she develops any worsening's symptoms or fever -Plenty of fluids and rest -Follow-up for any persistent or worsening symptoms   Meds ordered this encounter  Medications   amoxicillin-clavulanate (AUGMENTIN) 875-125 MG tablet    Sig: Take 1 tablet by mouth 2 (two) times daily.    Dispense:  14 tablet    Refill:  0    Follow-up: No follow-ups on file.    Carolann Littler, MD

## 2021-05-09 NOTE — ED Provider Notes (Signed)
[name]    CSN: [id number] Arrival date & time: 05/04/21  1809      History   Chief Complaint Chief Complaint  Patient presents with   Nasal Congestion   Cough    HPI [name] [name] is a 80 y.o. female.   HPI  Patient has had nasal congestion and cough for 2 to 3 days.  She is vaccinated against COVID and flu.  COVID-19 testing done.  Is taking over-the-counter medicines.  No known exposure to illness.  No fever or chills.  No nausea or vomiting  Past Medical History:  Diagnosis Date   COVID 10/25/2020   Dog bite of thigh 04/09/2012   Elevated lipids    GERD (gastroesophageal reflux disease)    Hypothyroidism    post thyroidectomy   Osteoarthritis    Osteopenia    t -2.5 hip in 2005 by Dexa   Osteoporosis    commpr fx after dog related fall  injury   Seasonal rhinitis    spring and fall   Skin cancer    squamous cell ca chest   Vaginal prolapse    hs corrected    Patient Active Problem List   Diagnosis Date Noted   Medication management 10/16/2013   Medicare annual wellness visit, subsequent 07/16/2012   Skin cancer    Heart palpitations 16-May-2011   Family history of sudden death 05-16-2011   PALPITATIONS 03/31/2010   BACK PAIN, LUMBAR 10/13/2009   HEARTBURN 10/13/2009   FREQUENCY, URINARY 10/13/2009   FOOT PAIN 03/31/2008   Hypothyroidism 01/25/2007   Osteoporosis 01/25/2007   OSTEOPENIA 01/25/2007    Past Surgical History:  Procedure Laterality Date   ABDOMINAL HYSTERECTOMY  07/2007   bladders sling surgery  07/2007   COLONOSCOPY  2006   THYROIDECTOMY      OB History     Gravida  1   Para  1   Term      Preterm      AB      Living  1      SAB      IAB      Ectopic      Multiple      Live Births               Home Medications    Prior to Admission medications   Medication Sig Start Date End Date Taking? Authorizing Provider  aspirin 325 MG EC tablet Take 325 mg by mouth every 6 (six) hours as  needed for pain.   Yes [provider]  amoxicillin-clavulanate (AUGMENTIN) 875-125 MG tablet Take 1 tablet by mouth 2 (two) times daily. 05/07/21   Burchette, Alinda Sierras, MD  b complex vitamins tablet Take 1 tablet by mouth daily.    [provider]  CALCIUM PO Take 1,200 mg by mouth daily.     [provider]  cholecalciferol (VITAMIN D) 1000 UNITS tablet Take 1,000 Units by mouth every morning.    [provider]  denosumab (PROLIA) 60 MG/ML SOLN injection Inject 60 mg into the skin every 6 (six) months. Administer in upper arm, thigh, or abdomen    [provider]  levothyroxine (SYNTHROID) 100 MCG tablet TAKE 1 TABLET(100 MCG) BY MOUTH DAILY 01/05/21   Panosh, Standley Brooking, MD  pantoprazole (PROTONIX) 20 MG tablet TAKE 1 TABLET(20 MG) BY MOUTH DAILY 02/22/21   Panosh, Standley Brooking, MD    Family History Family History  Problem Relation Age of Onset  Heart attack Mother    Alcohol abuse Mother    Kidney cancer Father    Hypertension Maternal Grandmother    Esophageal cancer Neg Hx    Colon cancer Neg Hx    Pancreatic cancer Neg Hx    Stomach cancer Neg Hx     Social History Social History   Tobacco Use   Smoking status: Never   Smokeless tobacco: Never  Vaping Use   Vaping Use: Never used  Substance Use Topics   Alcohol use: Yes    Comment: occasionally   Drug use: No     Allergies   Milk-related compounds, Onion, and Other   Review of Systems Review of Systems See HPI  Physical Exam Triage Vital Signs ED Triage Vitals  Enc Vitals Group     BP 05/04/21 1949 (!) 159/84     Pulse Rate 05/04/21 1949 72     Resp 05/04/21 1949 20     Temp 05/04/21 1949 97.7 F (36.5 C)     Temp src --      SpO2 05/04/21 1949 98 %     Weight 05/04/21 1939 149 lb (67.6 kg)     Height 05/04/21 1939 5' (1.524 m)     Head Circumference --      Peak Flow --      Pain Score 05/04/21 1939 0     Pain Loc --      Pain Edu? --      Excl. in [place]? --     No data found.  Updated Vital Signs BP (!) 159/84 (BP Location: Right Arm)   Pulse 72   Temp 97.7 F (36.5 C)   Resp 20   Ht 5' (1.524 m)   Wt 67.6 kg   SpO2 98%   BMI 29.10 kg/m      Physical Exam Constitutional:      General: She is not in acute distress.    Appearance: She is well-developed.  HENT:     Head: Normocephalic and atraumatic.     Right Ear: Tympanic membrane, ear canal and external ear normal.     Left Ear: Tympanic membrane, ear canal and external ear normal.     Nose: Congestion and rhinorrhea present.     Mouth/Throat:     Pharynx: No posterior oropharyngeal erythema.  Eyes:     Conjunctiva/sclera: Conjunctivae normal.     Pupils: Pupils are equal, round, and reactive to light.  Cardiovascular:     Rate and Rhythm: Normal rate and regular rhythm.     Heart sounds: Normal heart sounds.  Pulmonary:     Effort: Pulmonary effort is normal. No respiratory distress.     Breath sounds: Normal breath sounds. No wheezing or rales.  Abdominal:     General: There is no distension.     Palpations: Abdomen is soft.  Musculoskeletal:        General: Normal range of motion.     Cervical back: Normal range of motion.  Lymphadenopathy:     Cervical: No cervical adenopathy.  Skin:    General: Skin is warm and dry.  Neurological:     Mental Status: She is alert.     UC Treatments / Results  Labs (all labs ordered are listed, but only abnormal results are displayed) Labs Reviewed - No data to display  EKG   Radiology No results found.  Procedures Procedures (including critical care time)  Medications Ordered in UC Medications - No data to display  Initial Impression / Assessment and Plan / UC Course  I have reviewed the triage vital signs and the nursing notes.  Pertinent labs & imaging results that were available during my care of the patient were reviewed by me and considered in my medical decision making (see chart for details).     Early  URI.  3 days of symptoms.  We will treat conservatively Final Clinical Impressions(s) / UC Diagnoses   Final diagnoses:  None     Discharge Instructions      You can try Flonase for your symptoms.  I do not see any evidence of infection that would require an antibiotic.   ED Prescriptions     Medication Sig Dispense Auth. Provider   fluticasone (FLONASE) 50 MCG/ACT nasal spray 1 or 2 sprays each nostril twice a day 16 g Raylene Everts, MD      PDMP not reviewed this encounter.   Raylene Everts, MD 05/09/21 1630

## 2021-05-12 ENCOUNTER — Telehealth: Payer: Self-pay

## 2021-05-12 NOTE — Telephone Encounter (Signed)
PROLIA GIVEN 04/23/2021 NEXT INJECTION 10/23/2021

## 2021-05-12 NOTE — Telephone Encounter (Signed)
Patient called asking for Rx for a cough that hadn't gone away patient stated a friend gave her Benzonatate and it seemed to help

## 2021-05-12 NOTE — Telephone Encounter (Signed)
Pt has been added to virtual schedule on 05/13/2021

## 2021-05-13 ENCOUNTER — Telehealth (INDEPENDENT_AMBULATORY_CARE_PROVIDER_SITE_OTHER): Payer: Medicare Other | Admitting: Internal Medicine

## 2021-05-13 ENCOUNTER — Telehealth: Payer: Self-pay | Admitting: Internal Medicine

## 2021-05-13 ENCOUNTER — Encounter: Payer: Self-pay | Admitting: Internal Medicine

## 2021-05-13 VITALS — Ht 60.0 in | Wt 160.5 lb

## 2021-05-13 DIAGNOSIS — J069 Acute upper respiratory infection, unspecified: Secondary | ICD-10-CM

## 2021-05-13 DIAGNOSIS — Z8616 Personal history of COVID-19: Secondary | ICD-10-CM

## 2021-05-13 MED ORDER — BENZONATATE 100 MG PO CAPS: 100.0000 mg | ORAL_CAPSULE | Freq: Three times a day (TID) | ORAL | 0 refills | Status: DC | PRN

## 2021-05-13 NOTE — Progress Notes (Signed)
Virtual Visit via Video Note  I connected wit  [name] [name] NAME@ on 05/13/21 at  9:30 AM EST by a video enabled telemedicine application and verified that I am speaking with the correct person using two identifiers. Location patient: home Location provider:work office Persons participating in the virtual visit: patient, provider  [place] national recommendations  regarding COVID 19 pandemic   video visit is advised over in office visit for this patient.  Patient aware  of the limitations of evaluation and management by telemedicine and  availability of in person appointments. and agreed to proceed.   HPI: [name] [name] presents for video visit because of ongoing cough and request to see if cough medication would be helpful for comfort. Was seen in the emergency room urgent care about 8 or 9 days ago for cough felt to be viral had runny nose then saw Dr. Elease Hashimoto last week added Augmentin for 7 days in case but no other signs of pneumonia. Since then her drainage is clear her cough is croupy but no respiratory distress otherwise.  A friend gave her 1 dose of Tessalon Perles and she felt it helped her symptoms would like to try. She has a past history of COVID in May and states that this illness is worse than she had with COVID.  Asks about the next COVID booster  ROS: See pertinent positives and negatives per HPI.  Past Medical History:  Diagnosis Date   COVID 10/25/2020   Dog bite of thigh 04/09/2012   Elevated lipids    GERD (gastroesophageal reflux disease)    Hypothyroidism    post thyroidectomy   Osteoarthritis    Osteopenia    t -2.5 hip in 2005 by Dexa   Osteoporosis    commpr fx after dog related fall  injury   Seasonal rhinitis    spring and fall   Skin cancer    squamous cell ca chest   Vaginal prolapse    hs corrected    Past Surgical History:  Procedure Laterality Date   ABDOMINAL HYSTERECTOMY  07/2007   bladders sling surgery  07/2007   COLONOSCOPY  2006    THYROIDECTOMY      Family History  Problem Relation Age of Onset   Heart attack Mother    Alcohol abuse Mother    Kidney cancer Father    Hypertension Maternal Grandmother    Esophageal cancer Neg Hx    Colon cancer Neg Hx    Pancreatic cancer Neg Hx    Stomach cancer Neg Hx     Social History   Tobacco Use   Smoking status: Never   Smokeless tobacco: Never  Vaping Use   Vaping Use: Never used  Substance Use Topics   Alcohol use: Yes    Comment: occasionally   Drug use: No      Current Outpatient Medications:    amoxicillin-clavulanate (AUGMENTIN) 875-125 MG tablet, Take 1 tablet by mouth 2 (two) times daily., Disp: 14 tablet, Rfl: 0   aspirin 325 MG EC tablet, Take 325 mg by mouth every 6 (six) hours as needed for pain., Disp: , Rfl:    b complex vitamins tablet, Take 1 tablet by mouth daily., Disp: , Rfl:    benzonatate (TESSALON PERLES) 100 MG capsule, Take 1-2 capsules (100-200 mg total) by mouth 3 (three) times daily as needed for cough., Disp: 21 capsule, Rfl: 0   CALCIUM PO, Take 1,200 mg by mouth daily. , Disp: , Rfl:    cholecalciferol (VITAMIN  D) 1000 UNITS tablet, Take 1,000 Units by mouth every morning., Disp: , Rfl:    denosumab (PROLIA) 60 MG/ML SOLN injection, Inject 60 mg into the skin every 6 (six) months. Administer in upper arm, thigh, or abdomen, Disp: , Rfl:    levothyroxine (SYNTHROID) 100 MCG tablet, TAKE 1 TABLET(100 MCG) BY MOUTH DAILY, Disp: 90 tablet, Rfl: 2   pantoprazole (PROTONIX) 20 MG tablet, TAKE 1 TABLET(20 MG) BY MOUTH DAILY, Disp: 90 tablet, Rfl: 1  EXAM: BP Readings from Last 3 Encounters:  05/07/21 140/80  05/04/21 (!) 159/84  03/05/21 120/76    VITALS per patient if applicable: pulse ox 97  bp 127/84  GENERAL: alert, oriented, appears well and in no acute distress nontoxic with occasional croupy bronchial cough.  There is no stridor or respiratory distress  HEENT: atraumatic, conjunttiva clear, no obvious abnormalities on  inspection of external nose and ears mild congestion    NECK: normal movements of the head and neck  LUNGS: on inspection no signs of respiratory distress, breathing rate appears normal, no obvious gross SOB, gasping or wheezing  CV: no obvious cyanosis   PSYCH/NEURO: pleasant and cooperative, no obvious depression or anxiety, speech and thought processing grossly intact  Record review previous visits to ED 11 /8 and office visit 11/11 ASSESSMENT AND PLAN:  Discussed the following assessment and plan:    ICD-10-CM   1. Viral upper respiratory tract infection with cough  J06.9    neg covid neg flu on agumentin course     2. History of 2019 novel coronavirus disease (COVID-19)  Z86.16    may     Seen x 2 in past week felt consistent with viral respiratory infection however possible secondary treated with Augmentin.  Cough sounds bronchial sounds croupy but no respiratory distress and vital signs are normal. Risk-benefit of cough pills.  Prescribed benzoate to try with caution for comfort at night or as needed. Expectant management coughs can last another week but she should feel better follow-up with signs of relapse alarm. Counseled.   Expectant management and discussion of plan and treatment with opportunity to ask questions and all were answered. The patient agreed with the plan and demonstrated an understanding of the instructions.   Advised to call back or seek an in-person evaluation if worsening  or having  further concerns  in interim. Return if symptoms worsen or fail to improve as expected   relapsing sx etc.    Shanon Ace, MD

## 2021-05-13 NOTE — Telephone Encounter (Signed)
Disregard

## 2021-05-24 ENCOUNTER — Telehealth: Payer: Self-pay | Admitting: Internal Medicine

## 2021-05-24 NOTE — Telephone Encounter (Signed)
Pt is calling and would like to know if she is due pna vaccine and shingrix vaccine

## 2021-05-25 NOTE — Telephone Encounter (Signed)
Looks like she is utd  on pneumonia vaccine  She can get the shingrix vaccine at her pharmacy .

## 2021-05-26 NOTE — Telephone Encounter (Signed)
Pt informed of the message and verbalized understanding  

## 2021-06-21 ENCOUNTER — Telehealth: Payer: Self-pay | Admitting: Internal Medicine

## 2021-06-25 NOTE — Telephone Encounter (Signed)
Patient called to get refill on levothyroxine (SYNTHROID) 100 MCG tablet    Please send to  [facility] [address] AT [address] Phone:  [phone]  Fax:  [phone]        Please advise

## 2021-06-29 DIAGNOSIS — Z20822 Contact with and (suspected) exposure to covid-19: Secondary | ICD-10-CM | POA: Diagnosis not present

## 2021-06-30 DIAGNOSIS — U071 COVID-19: Secondary | ICD-10-CM | POA: Diagnosis not present

## 2021-07-01 ENCOUNTER — Encounter: Payer: Self-pay | Admitting: Podiatry

## 2021-07-01 ENCOUNTER — Ambulatory Visit (INDEPENDENT_AMBULATORY_CARE_PROVIDER_SITE_OTHER): Payer: Medicare Other | Admitting: Podiatry

## 2021-07-01 ENCOUNTER — Other Ambulatory Visit: Payer: Self-pay

## 2021-07-01 ENCOUNTER — Ambulatory Visit (INDEPENDENT_AMBULATORY_CARE_PROVIDER_SITE_OTHER): Payer: Medicare Other

## 2021-07-01 DIAGNOSIS — M2041 Other hammer toe(s) (acquired), right foot: Secondary | ICD-10-CM | POA: Diagnosis not present

## 2021-07-01 DIAGNOSIS — M2011 Hallux valgus (acquired), right foot: Secondary | ICD-10-CM

## 2021-07-01 NOTE — Progress Notes (Signed)
Subjective:  Patient ID: [name] [name], female    DOB: [birth year],  MRN: [id number] HPI Chief Complaint  Patient presents with   Toe Pain    2nd toe right - hammertoe x years, crosses over hallux, bunion deformity, but not painful, [facility] evaluated-recommended taking the toe off, "not a fan of that", 2nd opinion   New Patient (Initial Visit)    81 y.o. female presents with the above complaint.   ROS: Denies fever chills nausea vomiting muscle aches pains calf pain back pain chest pain shortness of breath.  Past Medical History:  Diagnosis Date   COVID 10/25/2020   Dog bite of thigh 04/09/2012   Elevated lipids    GERD (gastroesophageal reflux disease)    Hypothyroidism    post thyroidectomy   Osteoarthritis    Osteopenia    t -2.5 hip in 2005 by Dexa   Osteoporosis    commpr fx after dog related fall  injury   Seasonal rhinitis    spring and fall   Skin cancer    squamous cell ca chest   Vaginal prolapse    hs corrected   Past Surgical History:  Procedure Laterality Date   ABDOMINAL HYSTERECTOMY  07/2007   bladders sling surgery  07/2007   COLONOSCOPY  2006   THYROIDECTOMY      Current Outpatient Medications:    aspirin 325 MG EC tablet, Take 325 mg by mouth every 6 (six) hours as needed for pain., Disp: , Rfl:    b complex vitamins tablet, Take 1 tablet by mouth daily., Disp: , Rfl:    benzonatate (TESSALON PERLES) 100 MG capsule, Take 1-2 capsules (100-200 mg total) by mouth 3 (three) times daily as needed for cough., Disp: 21 capsule, Rfl: 0   CALCIUM PO, Take 1,200 mg by mouth daily. , Disp: , Rfl:    cholecalciferol (VITAMIN D) 1000 UNITS tablet, Take 1,000 Units by mouth every morning., Disp: , Rfl:    denosumab (PROLIA) 60 MG/ML SOLN injection, Inject 60 mg into the skin every 6 (six) months. Administer in upper arm, thigh, or abdomen, Disp: , Rfl:    levothyroxine (SYNTHROID) 100 MCG tablet, TAKE 1 TABLET(100 MCG) BY MOUTH DAILY, Disp: 90 tablet, Rfl: 2    pantoprazole (PROTONIX) 20 MG tablet, TAKE 1 TABLET(20 MG) BY MOUTH DAILY, Disp: 90 tablet, Rfl: 1  Allergies  Allergen Reactions   Milk-Related Compounds Other (See Comments)   Onion Other (See Comments)   Other Other (See Comments)    Green Peppers   Review of Systems Objective:  There were no vitals filed for this visit.  General: Well developed, nourished, in no acute distress, alert and oriented x3   Dermatological: Skin is warm, dry and supple bilateral. Nails x 10 are well maintained; remaining integument appears unremarkable at this time. There are no open sores, no preulcerative lesions, no rash or signs of infection present.  Vascular: Dorsalis Pedis artery and Posterior Tibial artery pedal pulses are 2/4 bilateral with immedate capillary fill time. Pedal hair growth present. No varicosities and no lower extremity edema present bilateral.   Neruologic: Grossly intact via light touch bilateral. Vibratory intact via tuning fork bilateral. Protective threshold with Semmes Wienstein monofilament intact to all pedal sites bilateral. Patellar and Achilles deep tendon reflexes 2+ bilateral. No Babinski or clonus noted bilateral.   Musculoskeletal: No gross boney pedal deformities bilateral. No pain, crepitus, or limitation noted with foot and ankle range of motion bilateral. Muscular strength 5/5 in all groups  tested bilateral.  Severe hallux abductovalgus deformity of the right foot with a vertically cocked up second digit with medial rotation.  The toe is flail at the level of the metatarsal phalangeal joint but rigid at the PIPJ with his 90 degree contracture.  The first metatarsophalangeal joint is minimally reducible.  Gait: Unassisted, Nonantalgic.    Radiographs:  Radiographs taken today demonstrate osseous mature individual significant osteopenia.  Severe hallux valgus deformity of the right foot with severe hammertoe deformity.  There is complete dislocation at the level of  the second metatarsophalangeal joint as well as near dislocation of the first metatarsal phalangeal joint.    Assessment & Plan:   Assessment: Hallux valgus deformity cocked up hammertoe deformity.  Plan: Discussed etiology pathology conservative surgical therapies at this point I explained to her that her best option would be removal of the second toe otherwise she would have to have a Lapidus procedure and I do not think the second toe would ever function normally at this point anyway.  She understands that and is amendable to it.  She states that is exactly what [facility] told her and at this point she would rather keep her toe.     [name] [name] T. [place], [place]

## 2021-07-02 ENCOUNTER — Other Ambulatory Visit: Payer: Self-pay

## 2021-07-02 ENCOUNTER — Emergency Department (INDEPENDENT_AMBULATORY_CARE_PROVIDER_SITE_OTHER)
Admission: EM | Admit: 2021-07-02 | Discharge: 2021-07-02 | Disposition: A | Discharge status: Home / Self Care | Payer: Medicare Other | Source: Home / Self Care | Attending: Family Medicine | Admitting: Family Medicine

## 2021-07-02 ENCOUNTER — Encounter: Payer: Self-pay | Admitting: Emergency Medicine

## 2021-07-02 DIAGNOSIS — J069 Acute upper respiratory infection, unspecified: Secondary | ICD-10-CM | POA: Diagnosis not present

## 2021-07-02 NOTE — ED Triage Notes (Signed)
Sinus congestion & cough w/ runny nose x 1 week  Feels SOB when walking for exercise x 1 week  COVID test negative at [facility]  - ASA - no cold meds

## 2021-07-02 NOTE — Discharge Instructions (Addendum)
Take plain guaifenesin (1200mg  extended release tabs such as Mucinex) twice daily, with plenty of water, for cough and congestion.  Get adequate rest.   Also recommend using saline nasal spray several times daily and saline nasal irrigation (AYR is a common brand).    May take Delsym Cough Suppressant ("12 Hour Cough Relief") at bedtime for nighttime cough.  Try warm salt water gargles for sore throat.  Stop all antihistamines (Zyrtec, etc) for now, and other non-prescription cough/cold preparations. May take Ibuprofen as needed for body aches, headache, etc.

## 2021-07-02 NOTE — ED Provider Notes (Signed)
[name]    CSN: [id number] Arrival date & time: 07/02/21  1817      History   Chief Complaint Chief Complaint  Patient presents with   Facial Pain    HPI [name] [name] is a 81 y.o. female.   One week ago patient developed mild nasal congestion and a non-productive cough, now worse at night.  She has not had a fever and does not feel ill, but has noticed mild shortness of breath with activity.  She has had a mild headache but no myalgias.  She denies pleuritic pain.  [facility].  The history is provided by the patient.   Past Medical History:  Diagnosis Date   COVID 10/25/2020   Dog bite of thigh 04/09/2012   Elevated lipids    GERD (gastroesophageal reflux disease)    Hypothyroidism    post thyroidectomy   Osteoarthritis    Osteopenia    t -2.5 hip in 2005 by Dexa   Osteoporosis    commpr fx after dog related fall  injury   Seasonal rhinitis    spring and fall   Skin cancer    squamous cell ca chest   Vaginal prolapse    hs corrected    Patient Active Problem List   Diagnosis Date Noted   Medication management 10/16/2013   Medicare annual wellness visit, subsequent 07/16/2012   Skin cancer    Heart palpitations 05-13-2011   Family history of sudden death 13-May-2011   PALPITATIONS 03/31/2010   BACK PAIN, LUMBAR 10/13/2009   HEARTBURN 10/13/2009   FOOT PAIN 03/31/2008   Hypothyroidism 01/25/2007   Osteoporosis 01/25/2007   OSTEOPENIA 01/25/2007    Past Surgical History:  Procedure Laterality Date   ABDOMINAL HYSTERECTOMY  07/2007   bladders sling surgery  07/2007   COLONOSCOPY  2006   THYROIDECTOMY      OB History     Gravida  1   Para  1   Term      Preterm      AB      Living  1      SAB      IAB      Ectopic      Multiple      Live Births               Home Medications    Prior to Admission medications   Medication Sig Start Date End Date Taking? Authorizing Provider   aspirin 325 MG EC tablet Take 325 mg by mouth every 6 (six) hours as needed for pain.    [provider]  b complex vitamins tablet Take 1 tablet by mouth daily.    [provider]  benzonatate (TESSALON PERLES) 100 MG capsule Take 1-2 capsules (100-200 mg total) by mouth 3 (three) times daily as needed for cough. Patient not taking: Reported on 07/02/2021 05/13/21   Panosh, Standley Brooking, MD  CALCIUM PO Take 1,200 mg by mouth daily.     [provider]  cholecalciferol (VITAMIN D) 1000 UNITS tablet Take 1,000 Units by mouth every morning.    [provider]  denosumab (PROLIA) 60 MG/ML SOLN injection Inject 60 mg into the skin every 6 (six) months. Administer in upper arm, thigh, or abdomen    [provider]  levothyroxine (SYNTHROID) 100 MCG tablet TAKE 1 TABLET(100 MCG) BY MOUTH DAILY 06/22/21   Panosh, Standley Brooking, MD  pantoprazole (PROTONIX) 20 MG  tablet TAKE 1 TABLET(20 MG) BY MOUTH DAILY 02/22/21   Panosh, Standley Brooking, MD    Family History Family History  Problem Relation Age of Onset   Heart attack Mother    Alcohol abuse Mother    Kidney cancer Father    Hypertension Maternal Grandmother    Esophageal cancer Neg Hx    Colon cancer Neg Hx    Pancreatic cancer Neg Hx    Stomach cancer Neg Hx     Social History Social History   Tobacco Use   Smoking status: Never   Smokeless tobacco: Never  Vaping Use   Vaping Use: Never used  Substance Use Topics   Alcohol use: Yes    Comment: occasionally   Drug use: No     Allergies   Milk-related compounds, Onion, and Other   Review of Systems Review of Systems No sore throat + cough No pleuritic pain No wheezing + nasal congestion + post-nasal drainage No sinus pain/pressure No itchy/red eyes ? earache No hemoptysis + mild SOB with activity No fever, + chills No nausea No vomiting No abdominal pain No diarrhea No urinary symptoms No skin rash + fatigue No myalgias + mild  headache Used OTC meds (ibuprofen) without relief   Physical Exam Triage Vital Signs ED Triage Vitals  Enc Vitals Group     BP 07/02/21 1926 (!) 156/84     Pulse Rate 07/02/21 1926 72     Resp 07/02/21 1926 18     Temp 07/02/21 1926 97.6 F (36.4 C)     Temp Source 07/02/21 1926 Oral     SpO2 07/02/21 1926 99 %     Weight 07/02/21 1928 149 lb (67.6 kg)     Height 07/02/21 1928 5' (1.524 m)     Head Circumference --      Peak Flow --      Pain Score 07/02/21 1928 0     Pain Loc --      Pain Edu? --      Excl. in [place]? --    No data found.  Updated Vital Signs BP (!) 156/84 (BP Location: Left Arm)    Pulse 72    Temp 97.6 F (36.4 C) (Oral)    Resp 18    Ht 5' (1.524 m)    Wt 67.6 kg    SpO2 99%    BMI 29.10 kg/m   Visual Acuity Right Eye Distance:   Left Eye Distance:   Bilateral Distance:    Right Eye Near:   Left Eye Near:    Bilateral Near:     Physical Exam Nursing notes and Vital Signs reviewed. Appearance:  Patient appears stated age, and in no acute distress Eyes:  Pupils are equal, round, and reactive to light and accomodation.  Extraocular movement is intact.  Conjunctivae are not inflamed  Ears:  Canals normal.  Tympanic membranes normal.  Nose:  Mildly congested turbinates.  No sinus tenderness.   Pharynx:  Normal Neck:  Supple.  Mildly enlarged lateral nodes are present, tender to palpation on the left.   Lungs:  Clear to auscultation.  Breath sounds are equal.  Moving air well. Heart:  Regular rate and rhythm without murmurs, rubs, or gallops.  Abdomen:  Nontender without masses or hepatosplenomegaly.  Bowel sounds are present.  No CVA or flank tenderness.  Extremities:  No edema.  Skin:  No rash present.   UC Treatments / Results  Labs (all labs ordered are listed, but  only abnormal results are displayed) Labs Reviewed - No data to display  EKG   Radiology DG Foot Complete Right  Result Date: 07/01/2021 Please see detailed radiograph report in  office note.   Procedures Procedures (including critical care time)  Medications Ordered in UC Medications - No data to display  Initial Impression / Assessment and Plan / UC Course  I have reviewed the triage vital signs and the nursing notes.  Pertinent labs & imaging results that were available during my care of the patient were reviewed by me and considered in my medical decision making (see chart for details).    Benign exam.  There is no evidence of bacterial infection today.  Treat symptomatically for now  Followup with Family Doctor if not improved in one week.   Final Clinical Impressions(s) / UC Diagnoses   Final diagnoses:  Viral URI with cough     Discharge Instructions      Take plain guaifenesin (1200mg  extended release tabs such as Mucinex) twice daily, with plenty of water, for cough and congestion.  Get adequate rest.   Also recommend using saline nasal spray several times daily and saline nasal irrigation (AYR is a common brand).    May take Delsym Cough Suppressant ("12 Hour Cough Relief") at bedtime for nighttime cough.  Try warm salt water gargles for sore throat.  Stop all antihistamines (Zyrtec, etc) for now, and other non-prescription cough/cold preparations. May take Ibuprofen as needed for body aches, headache, etc.      ED Prescriptions   None       Kandra Nicolas, MD 07/03/21 1129

## 2021-07-08 ENCOUNTER — Ambulatory Visit (INDEPENDENT_AMBULATORY_CARE_PROVIDER_SITE_OTHER): Payer: Medicare Other | Admitting: Adult Health

## 2021-07-08 ENCOUNTER — Encounter: Payer: Self-pay | Admitting: Adult Health

## 2021-07-08 VITALS — BP 150/90 | HR 61 | Temp 97.0°F | Ht 60.0 in | Wt 164.0 lb

## 2021-07-08 DIAGNOSIS — J069 Acute upper respiratory infection, unspecified: Secondary | ICD-10-CM

## 2021-07-08 DIAGNOSIS — R030 Elevated blood-pressure reading, without diagnosis of hypertension: Secondary | ICD-10-CM | POA: Diagnosis not present

## 2021-07-08 LAB — CBC WITH DIFFERENTIAL/PLATELET
Basophils Absolute: 0 10*3/uL (ref 0.0–0.1)
Basophils Relative: 0.2 % (ref 0.0–3.0)
Eosinophils Absolute: 0.3 10*3/uL (ref 0.0–0.7)
Eosinophils Relative: 3.4 % (ref 0.0–5.0)
HCT: 40 % (ref 36.0–46.0)
Hemoglobin: 12.9 g/dL (ref 12.0–15.0)
Lymphocytes Relative: 25.7 % (ref 12.0–46.0)
Lymphs Abs: 2.2 10*3/uL (ref 0.7–4.0)
MCHC: 32.2 g/dL (ref 30.0–36.0)
MCV: 97.2 fl (ref 78.0–100.0)
Monocytes Absolute: 0.7 10*3/uL (ref 0.1–1.0)
Monocytes Relative: 8.5 % (ref 3.0–12.0)
Neutro Abs: 5.2 10*3/uL (ref 1.4–7.7)
Neutrophils Relative %: 62.2 % (ref 43.0–77.0)
Platelets: 298 10*3/uL (ref 150.0–400.0)
RBC: 4.12 Mil/uL (ref 3.87–5.11)
RDW: 14.8 % (ref 11.5–15.5)
WBC: 8.4 10*3/uL (ref 4.0–10.5)

## 2021-07-08 LAB — COMPREHENSIVE METABOLIC PANEL
ALT: 18 U/L (ref 0–35)
AST: 30 U/L (ref 0–37)
Albumin: 4.1 g/dL (ref 3.5–5.2)
Alkaline Phosphatase: 32 U/L — ABNORMAL LOW (ref 39–117)
BUN: 18 mg/dL (ref 6–23)
CO2: 29 mEq/L (ref 19–32)
Calcium: 9.1 mg/dL (ref 8.4–10.5)
Chloride: 108 mEq/L (ref 96–112)
Creatinine, Ser: 0.77 mg/dL (ref 0.40–1.20)
GFR: 73.11 mL/min (ref >60.00)
Glucose, Bld: 95 mg/dL (ref 70–99)
Potassium: 3.9 mEq/L (ref 3.5–5.1)
Sodium: 143 mEq/L (ref 135–145)
Total Bilirubin: 1.1 mg/dL (ref 0.2–1.2)
Total Protein: 6.6 g/dL (ref 6.0–8.3)

## 2021-07-08 NOTE — Progress Notes (Signed)
Subjective:    Patient ID: [name] [name], female    DOB: 02-26-1941, 81 y.o.   MRN: [id number]  HPI  81 year old female who  has a past medical history of COVID (10/25/2020), Dog bite of thigh (04/09/2012), Elevated lipids, GERD (gastroesophageal reflux disease), Hypothyroidism, Osteoarthritis, Osteopenia, Osteoporosis, Seasonal rhinitis, Skin cancer, and Vaginal prolapse.  She is a patient of Dr. Regis Bill who I am seeing today for follow up after being seen at [facility]  She was seen 6 days ago for nasal congestion and a nonproductive cough that had developed a week prior to being seen at urgent care.  At this time denied fevers and did not feel ill, but had noticed mild shortness of breath with activity.  She also had a mild headache but no myalgias.  [facility] was [facility]  There were no signs of a bacterial infection and she was diagnosed with a viral respiratory infection.  She was advised to take Mucinex twice a day and get adequate rest, could use Delsym as well as saline nasal spray.  She has noticed that since she has been sick her BP has been elevated upwards of 170/90's. She stopped taking Mucinex when she saw her BP was elevated. Has no noticed any improvement in her BP since stopping Muxinex. No other medication changes   Today she reports that her cough has improved. She continues to have PND and shortness with activity but that this is also improving.  BP Readings from Last 3 Encounters:  07/08/21 (!) 150/90  07/02/21 (!) 156/84  05/07/21 140/80   She did not report headaches, blurred vision, or dizziness, and lightheadedness.   Review of Systems See HPI   Past Medical History:  Diagnosis Date   COVID 10/25/2020   Dog bite of thigh 04/09/2012   Elevated lipids    GERD (gastroesophageal reflux disease)    Hypothyroidism    post thyroidectomy   Osteoarthritis    Osteopenia    t -2.5 hip in 2005 by Dexa   Osteoporosis    commpr fx after dog  related fall  injury   Seasonal rhinitis    spring and fall   Skin cancer    squamous cell ca chest   Vaginal prolapse    hs corrected    Social History   Socioeconomic History   Marital status: Widowed    Spouse name: Not on file   Number of children: Not on file   Years of education: Not on file   Highest education level: Associate degree: academic program  Occupational History   Not on file  Tobacco Use   Smoking status: Never   Smokeless tobacco: Never  Vaping Use   Vaping Use: Never used  Substance and Sexual Activity   Alcohol use: Yes    Comment: occasionally   Drug use: No   Sexual activity: Not on file  Other Topics Concern   Not on file  Social History Narrative   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   Regular Exercise- yes   Now on rx   Working  Full time        hh of 2 cats and dogs in hh    Social Determinants of Radio broadcast assistant Strain: Low Risk    Difficulty of Paying Living Expenses: Not hard at all  Food Insecurity: No Food Insecurity   Worried About Charity fundraiser in the Last Year: Never true  Ran Out of Food in the Last Year: Never true  Transportation Needs: No Transportation Needs   Lack of Transportation (Medical): No   Lack of Transportation (Non-Medical): No  Physical Activity: Sufficiently Active   Days of Exercise per Week: 5 days   Minutes of Exercise per Session: 60 min  Stress: No Stress Concern Present   Feeling of Stress : Not at all  Social Connections: Moderately Integrated   Frequency of Communication with Friends and Family: More than three times a week   Frequency of Social Gatherings with Friends and Family: Once a week   Attends Religious Services: More than 4 times per year   Active Member of [employer] or Organizations: Yes   Attends Archivist Meetings: More than 4 times per year   Marital Status: Widowed  Intimate Partner Violence: Not At Risk   Fear of Current or Ex-Partner: No   Emotionally  Abused: No   Physically Abused: No   Sexually Abused: No    Past Surgical History:  Procedure Laterality Date   ABDOMINAL HYSTERECTOMY  07/2007   bladders sling surgery  07/2007   COLONOSCOPY  2006   THYROIDECTOMY      Family History  Problem Relation Age of Onset   Heart attack Mother    Alcohol abuse Mother    Kidney cancer Father    Hypertension Maternal Grandmother    Esophageal cancer Neg Hx    Colon cancer Neg Hx    Pancreatic cancer Neg Hx    Stomach cancer Neg Hx     Allergies  Allergen Reactions   Milk-Related Compounds Other (See Comments)   Onion Other (See Comments)   Other Other (See Comments)    [name]    Current Outpatient Medications on File Prior to Visit  Medication Sig Dispense Refill   aspirin 325 MG EC tablet Take 325 mg by mouth every 6 (six) hours as needed for pain.     b complex vitamins tablet Take 1 tablet by mouth daily.     benzonatate (TESSALON PERLES) 100 MG capsule Take 1-2 capsules (100-200 mg total) by mouth 3 (three) times daily as needed for cough. 21 capsule 0   CALCIUM PO Take 1,200 mg by mouth daily.      cholecalciferol (VITAMIN D) 1000 UNITS tablet Take 1,000 Units by mouth every morning.     denosumab (PROLIA) 60 MG/ML SOLN injection Inject 60 mg into the skin every 6 (six) months. Administer in upper arm, thigh, or abdomen     levothyroxine (SYNTHROID) 100 MCG tablet TAKE 1 TABLET(100 MCG) BY MOUTH DAILY 90 tablet 2   pantoprazole (PROTONIX) 20 MG tablet TAKE 1 TABLET(20 MG) BY MOUTH DAILY 90 tablet 1   No current facility-administered medications on file prior to visit.    BP (!) 150/90    Pulse 61    Temp (!) 97 F (36.1 C) (Oral)    Ht 5' (1.524 m)    Wt 164 lb (74.4 kg)    SpO2 96%    BMI 32.03 kg/m       Objective:   Physical Exam Vitals and nursing note reviewed.  Constitutional:      Appearance: Normal appearance.  Cardiovascular:     Rate and Rhythm: Normal rate and regular rhythm.     Pulses: Normal  pulses.     Heart sounds: Normal heart sounds.  Pulmonary:     Effort: Pulmonary effort is normal.     Breath sounds: Normal  breath sounds.  Musculoskeletal:        General: Normal range of motion.  Skin:    General: Skin is warm and dry.  Neurological:     General: No focal deficit present.     Mental Status: She is alert and oriented to person, place, and time.  Psychiatric:        Mood and Affect: Mood normal.        Behavior: Behavior normal.        Thought Content: Thought content normal.        Judgment: Judgment normal.      Assessment & Plan:   1. Viral upper respiratory tract infection with cough - No signs of bacterial infection  - Symptoms improving   2. Elevated blood pressure reading -Possibly due to viral infection.  We will have her stay hydrated and continue to monitor her blood pressure over the next week.  If blood pressure not back to baseline then follow-up with PCP - CBC with Differential/Platelet; Future - Comprehensive metabolic panel; Future - Comprehensive metabolic panel - CBC with Differential/Platelet  Dorothyann Peng, NP

## 2021-07-21 DIAGNOSIS — L821 Other seborrheic keratosis: Secondary | ICD-10-CM | POA: Diagnosis not present

## 2021-07-21 DIAGNOSIS — L579 Skin changes due to chronic exposure to nonionizing radiation, unspecified: Secondary | ICD-10-CM | POA: Diagnosis not present

## 2021-07-21 DIAGNOSIS — Z85828 Personal history of other malignant neoplasm of skin: Secondary | ICD-10-CM | POA: Diagnosis not present

## 2021-07-21 DIAGNOSIS — L818 Other specified disorders of pigmentation: Secondary | ICD-10-CM | POA: Diagnosis not present

## 2021-07-21 DIAGNOSIS — L905 Scar conditions and fibrosis of skin: Secondary | ICD-10-CM | POA: Diagnosis not present

## 2021-07-21 DIAGNOSIS — D1801 Hemangioma of skin and subcutaneous tissue: Secondary | ICD-10-CM | POA: Diagnosis not present

## 2021-07-21 DIAGNOSIS — D692 Other nonthrombocytopenic purpura: Secondary | ICD-10-CM | POA: Diagnosis not present

## 2021-07-28 ENCOUNTER — Telehealth: Payer: Self-pay | Admitting: Internal Medicine

## 2021-07-28 ENCOUNTER — Ambulatory Visit (INDEPENDENT_AMBULATORY_CARE_PROVIDER_SITE_OTHER): Payer: Medicare Other | Admitting: Family Medicine

## 2021-07-28 VITALS — BP 140/80 | HR 79 | Temp 98.1°F | Wt 160.8 lb

## 2021-07-28 DIAGNOSIS — J069 Acute upper respiratory infection, unspecified: Secondary | ICD-10-CM

## 2021-07-28 LAB — POC INFLUENZA A&B (BINAX/QUICKVUE)
Influenza A, POC: NEGATIVE
Influenza B, POC: NEGATIVE

## 2021-07-28 LAB — POC COVID19 BINAXNOW: SARS Coronavirus 2 Ag: NEGATIVE

## 2021-07-28 NOTE — Progress Notes (Signed)
Established Patient Office Visit  Subjective:  Patient ID: [name] [name], female    DOB: 03-26-1941  Age: 81 y.o. MRN: [id number]  CC:  Chief Complaint  Patient presents with   Nasal Congestion    Head congestion, postnasal drip, thick mucus in the back of the throat, x 3 days, covid and flu neg    HPI [name] [name] presents for approximately 3-day history of nasal congestion, intermittent hoarseness, dry cough.  She states she had low-grade "fever "at the beginning but this was only 99.1.  None since then.  No known sick contacts though she is around public frequently.  Denies any body aches.  No nausea, vomiting, or diarrhea.  Cough relatively minimal.  Does have some sinus pressure diffusely.  No purulent secretions.  Past Medical History:  Diagnosis Date   COVID 10/25/2020   Dog bite of thigh 04/09/2012   Elevated lipids    GERD (gastroesophageal reflux disease)    Hypothyroidism    post thyroidectomy   Osteoarthritis    Osteopenia    t -2.5 hip in 2005 by Dexa   Osteoporosis    commpr fx after dog related fall  injury   Seasonal rhinitis    spring and fall   Skin cancer    squamous cell ca chest   Vaginal prolapse    hs corrected    Past Surgical History:  Procedure Laterality Date   ABDOMINAL HYSTERECTOMY  07/2007   bladders sling surgery  07/2007   COLONOSCOPY  2006   THYROIDECTOMY      Family History  Problem Relation Age of Onset   Heart attack Mother    Alcohol abuse Mother    Kidney cancer Father    Hypertension Maternal Grandmother    Esophageal cancer Neg Hx    Colon cancer Neg Hx    Pancreatic cancer Neg Hx    Stomach cancer Neg Hx     Social History   Socioeconomic History   Marital status: Widowed    Spouse name: Not on file   Number of children: Not on file   Years of education: Not on file   Highest education level: Associate degree: academic program  Occupational History   Not on file  Tobacco Use   Smoking status: Never    Smokeless tobacco: Never  Vaping Use   Vaping Use: Never used  Substance and Sexual Activity   Alcohol use: Yes    Comment: occasionally   Drug use: No   Sexual activity: Not on file  Other Topics Concern   Not on file  Social History Narrative   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   Regular Exercise- yes   Now on rx   Working  Full time        hh of 2 cats and dogs in hh    Social Determinants of Radio broadcast assistant Strain: Low Risk    Difficulty of Paying Living Expenses: Not hard at all  Food Insecurity: No Food Insecurity   Worried About Charity fundraiser in the Last Year: Never true   Arboriculturist in the Last Year: Never true  Transportation Needs: No Transportation Needs   Lack of Transportation (Medical): No   Lack of Transportation (Non-Medical): No  Physical Activity: Sufficiently Active   Days of Exercise per Week: 5 days   Minutes of Exercise per Session: 60 min  Stress: No Stress Concern Present   Feeling of Stress : Not at  all  Social Connections: Moderately Integrated   Frequency of Communication with Friends and Family: More than three times a week   Frequency of Social Gatherings with Friends and Family: Once a week   Attends Religious Services: More than 4 times per year   Active Member of [employer] or Organizations: Yes   Attends Archivist Meetings: More than 4 times per year   Marital Status: Widowed  Human resources officer Violence: Not At Risk   Fear of Current or Ex-Partner: No   Emotionally Abused: No   Physically Abused: No   Sexually Abused: No    Outpatient Medications Prior to Visit  Medication Sig Dispense Refill   aspirin 325 MG EC tablet Take 325 mg by mouth every 6 (six) hours as needed for pain.     b complex vitamins tablet Take 1 tablet by mouth daily.     benzonatate (TESSALON PERLES) 100 MG capsule Take 1-2 capsules (100-200 mg total) by mouth 3 (three) times daily as needed for cough. 21 capsule 0   CALCIUM PO Take  1,200 mg by mouth daily.      cholecalciferol (VITAMIN D) 1000 UNITS tablet Take 1,000 Units by mouth every morning.     denosumab (PROLIA) 60 MG/ML SOLN injection Inject 60 mg into the skin every 6 (six) months. Administer in upper arm, thigh, or abdomen     levothyroxine (SYNTHROID) 100 MCG tablet TAKE 1 TABLET(100 MCG) BY MOUTH DAILY 90 tablet 2   pantoprazole (PROTONIX) 20 MG tablet TAKE 1 TABLET(20 MG) BY MOUTH DAILY 90 tablet 1   No facility-administered medications prior to visit.    Allergies  Allergen Reactions   Milk-Related Compounds Other (See Comments)   Onion Other (See Comments)   Other Other (See Comments)    Green Peppers    ROS Review of Systems  Constitutional:  Negative for chills and fever.  HENT:  Positive for congestion and sinus pressure.   Respiratory:  Positive for cough. Negative for shortness of breath and wheezing.      Objective:    Physical Exam Vitals reviewed.  Constitutional:      Appearance: Normal appearance.  HENT:     Right Ear: Tympanic membrane normal.     Left Ear: Tympanic membrane normal.     Mouth/Throat:     Mouth: Mucous membranes are moist.     Pharynx: Oropharynx is clear. No oropharyngeal exudate or posterior oropharyngeal erythema.  Cardiovascular:     Rate and Rhythm: Normal rate.  Pulmonary:     Effort: Pulmonary effort is normal.     Breath sounds: Normal breath sounds. No wheezing or rales.  Musculoskeletal:     Cervical back: Neck supple.  Lymphadenopathy:     Cervical: No cervical adenopathy.  Neurological:     Mental Status: She is alert.    BP 140/80 (BP Location: Left Arm, Patient Position: Sitting, Cuff Size: Normal)    Pulse 79    Temp 98.1 F (36.7 C) (Oral)    Wt 160 lb 12.8 oz (72.9 kg)    SpO2 97%    BMI 31.40 kg/m  Wt Readings from Last 3 Encounters:  07/28/21 160 lb 12.8 oz (72.9 kg)  07/08/21 164 lb (74.4 kg)  07/02/21 149 lb (67.6 kg)     Health Maintenance Due  Topic Date Due   Hepatitis  C Screening  Never done   Zoster Vaccines- Shingrix (1 of 2) Never done   INFLUENZA VACCINE  01/25/2021  COVID-19 Vaccine (5 - Booster for Pfizer series) 03/12/2021    There are no preventive care reminders to display for this patient.  Lab Results  Component Value Date   TSH 1.25 09/23/2020   Lab Results  Component Value Date   WBC 8.4 07/08/2021   HGB 12.9 07/08/2021   HCT 40.0 07/08/2021   MCV 97.2 07/08/2021   PLT 298.0 07/08/2021   Lab Results  Component Value Date   NA 143 07/08/2021   K 3.9 07/08/2021   CO2 29 07/08/2021   GLUCOSE 95 07/08/2021   BUN 18 07/08/2021   CREATININE 0.77 07/08/2021   BILITOT 1.1 07/08/2021   ALKPHOS 32 (L) 07/08/2021   AST 30 07/08/2021   ALT 18 07/08/2021   PROT 6.6 07/08/2021   ALBUMIN 4.1 07/08/2021   CALCIUM 9.1 07/08/2021   GFR 73.11 07/08/2021   Lab Results  Component Value Date   CHOL 186 09/23/2020   Lab Results  Component Value Date   HDL 61.10 09/23/2020   Lab Results  Component Value Date   LDLCALC 112 (H) 09/23/2020   Lab Results  Component Value Date   TRIG 64.0 09/23/2020   Lab Results  Component Value Date   CHOLHDL 3 09/23/2020   Lab Results  Component Value Date   HGBA1C 5.8 09/23/2020      Assessment & Plan:   Problem List Items Addressed This Visit   None Visit Diagnoses     Viral upper respiratory illness    -  Primary   Relevant Orders   POC Influenza A&B(BINAX/QUICKVUE) (Completed)   POC COVID-19 (Completed)     Suspect viral URI with cough.  Influenza and COVID testing negative.  We recommended plenty of fluids and rest and over-the-counter Mucinex twice daily.  Follow-up promptly for any fever or any persistent or worsening symptoms.  No orders of the defined types were placed in this encounter.   Follow-up: No follow-ups on file.    Carolann Littler, MD

## 2021-07-28 NOTE — Telephone Encounter (Signed)
Patient calling in with respiratory symptoms: Shortness of breath, chest pain, palpitations or other red words send to Triage  Does the patient have a fever over 100, cough, congestion, sore throat, runny nose, lost of taste/smell (please list symptoms that patient has)? - mucus, fever, cough  What date did symptoms start? (If over 5 days ago, pt may be scheduled for in person visit)  Have you tested for Covid in the last 5 days? Yes   If yes, was it positive []  OR negative [x] ? If positive in the last 5 days, please schedule virtual visit now. If negative, schedule for an in person OV with the next available provider if PCP has no openings. Please also let patient know they will be tested again (follow the script below)  "you will have to arrive 22mins prior to your appt time to be Covid tested. Please park in back of office at the cone & call [phone] to let the staff know you have arrived. A staff member will meet you at your car to do a rapid covid test. Once the test has resulted you will be notified by phone of your results to determine if appt will remain an in person visit or be converted to a virtual/phone visit. If you arrive less than 20mins before your appt time, your visit will be automatically converted to virtual & any recommended testing will happen AFTER the visit."   [place]  If no availability for virtual visit in office,  please schedule another [place] office  If no availability at another [place] office, please instruct patient that they can schedule an evisit or virtual visit through their mychart account. Visits up to 8pm  patients can be seen in office 5 days after positive COVID test

## 2021-07-30 ENCOUNTER — Telehealth: Payer: Self-pay | Admitting: Internal Medicine

## 2021-07-30 NOTE — Telephone Encounter (Signed)
Pt call and stated she want to know can dr.Burchette call her in something for her sinus drainage .

## 2021-07-30 NOTE — Telephone Encounter (Signed)
Pt informed of the message below and verbalized understanding. Pt stated she would call back into the office on Monday if symptoms do not improve.

## 2021-08-01 ENCOUNTER — Encounter: Payer: Self-pay | Admitting: Emergency Medicine

## 2021-08-01 ENCOUNTER — Emergency Department (INDEPENDENT_AMBULATORY_CARE_PROVIDER_SITE_OTHER)
Admission: EM | Admit: 2021-08-01 | Discharge: 2021-08-01 | Disposition: A | Discharge status: Home / Self Care | Payer: Medicare Other | Source: Home / Self Care | Attending: Family Medicine | Admitting: Family Medicine

## 2021-08-01 ENCOUNTER — Other Ambulatory Visit: Payer: Self-pay

## 2021-08-01 DIAGNOSIS — J0191 Acute recurrent sinusitis, unspecified: Secondary | ICD-10-CM | POA: Diagnosis not present

## 2021-08-01 MED ORDER — AMOXICILLIN-POT CLAVULANATE 875-125 MG PO TABS: 1.0000 | ORAL_TABLET | Freq: Two times a day (BID) | ORAL | 0 refills | Status: DC

## 2021-08-01 NOTE — ED Triage Notes (Signed)
Sinus congestion w/ runny nose  Lost her voice yesterday  Saw PCP on Wed - negative COVID & Flu  Pt has been sick on & off since November  Denies fever or chills OTC mucinex

## 2021-08-01 NOTE — Discharge Instructions (Signed)
Continue to drink lots of water Use the saline (salt water) nasal rinse at least twice a day Take Augmentin antibiotic 2 times a day. If the Augmentin upset your stomach you may want to take a probiotic See your doctor if not improving by next week

## 2021-08-01 NOTE — ED Provider Notes (Signed)
[name]    CSN: [id number] Arrival date & time: 08/01/21  1049      History   Chief Complaint Chief Complaint  Patient presents with   Sore Throat    HPI [name] is a 81 y.o. female.   HPI  [name] [name] states that she has been sick for the better part of a month with sinus congestion, runny nose, postnasal drip, cough, coughing up yellow sputum.  She has had some fatigue.  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  She may not be drinking enough water.  No fever or chills.  She did see her physician.  They recommended symptomatic care for virus.  She states that she feels she is not improving  Past Medical History:  Diagnosis Date   COVID 10/25/2020   Dog bite of thigh 04/09/2012   Elevated lipids    GERD (gastroesophageal reflux disease)    Hypothyroidism    post thyroidectomy   Osteoarthritis    Osteopenia    t -2.5 hip in 2005 by Dexa   Osteoporosis    commpr fx after dog related fall  injury   Seasonal rhinitis    spring and fall   Skin cancer    squamous cell ca chest   Vaginal prolapse    hs corrected    Patient Active Problem List   Diagnosis Date Noted   Medication management 10/16/2013   Medicare annual wellness visit, subsequent 07/16/2012   Skin cancer    Heart palpitations 05-05-2011   Family history of sudden death 2011-05-05   PALPITATIONS 03/31/2010   BACK PAIN, LUMBAR 10/13/2009   HEARTBURN 10/13/2009   FOOT PAIN 03/31/2008   Hypothyroidism 01/25/2007   Osteoporosis 01/25/2007   OSTEOPENIA 01/25/2007    Past Surgical History:  Procedure Laterality Date   ABDOMINAL HYSTERECTOMY  07/2007   bladders sling surgery  07/2007   COLONOSCOPY  2006   THYROIDECTOMY      OB History     Gravida  1   Para  1   Term      Preterm      AB      Living  1      SAB      IAB      Ectopic      Multiple      Live Births               Home Medications    Prior to Admission medications    Medication Sig Start Date End Date Taking? Authorizing Provider  amoxicillin-clavulanate (AUGMENTIN) 875-125 MG tablet Take 1 tablet by mouth every 12 (twelve) hours. 08/01/21  Yes Raylene Everts, MD  aspirin 325 MG EC tablet Take 325 mg by mouth every 6 (six) hours as needed for pain.    [provider]  b complex vitamins tablet Take 1 tablet by mouth daily.    [provider]  CALCIUM PO Take 1,200 mg by mouth daily.     [provider]  cholecalciferol (VITAMIN D) 1000 UNITS tablet Take 1,000 Units by mouth every morning.    [provider]  denosumab (PROLIA) 60 MG/ML SOLN injection Inject 60 mg into the skin every 6 (six) months. Administer in upper arm, thigh, or abdomen    [provider]  levothyroxine (SYNTHROID) 100 MCG tablet TAKE 1 TABLET(100 MCG) BY MOUTH DAILY 06/22/21   Panosh, Standley Brooking, MD  pantoprazole (PROTONIX) 20 MG tablet TAKE 1  TABLET(20 MG) BY MOUTH DAILY 02/22/21   Panosh, Standley Brooking, MD    Family History Family History  Problem Relation Age of Onset   Heart attack Mother    Alcohol abuse Mother    Kidney cancer Father    Hypertension Maternal Grandmother    Esophageal cancer Neg Hx    Colon cancer Neg Hx    Pancreatic cancer Neg Hx    Stomach cancer Neg Hx     Social History Social History   Tobacco Use   Smoking status: Never   Smokeless tobacco: Never  Vaping Use   Vaping Use: Never used  Substance Use Topics   Alcohol use: Yes    Comment: occasionally   Drug use: No     Allergies   Milk-related compounds, Onion, and Other   Review of Systems Review of Systems See HPI  Physical Exam Triage Vital Signs ED Triage Vitals  Enc Vitals Group     BP 08/01/21 1125 (!) 158/76     Pulse Rate 08/01/21 1125 78     Resp 08/01/21 1125 17     Temp 08/01/21 1125 98.1 F (36.7 C)     Temp Source 08/01/21 1125 Oral     SpO2 08/01/21 1125 96 %     Weight 08/01/21 1127 153 lb (69.4 kg)     Height 08/01/21  1127 5' (1.524 m)     Head Circumference --      Peak Flow --      Pain Score 08/01/21 1127 4     Pain Loc --      Pain Edu? --      Excl. in [place]? --    No data found.  Updated Vital Signs BP (!) 158/76 (BP Location: Right Arm)    Pulse 78    Temp 98.1 F (36.7 C) (Oral)    Resp 17    Ht 5' (1.524 m)    Wt 69.4 kg    SpO2 96%    BMI 29.88 kg/m      Physical Exam Constitutional:      General: She is not in acute distress.    Appearance: She is well-developed.     Comments: Slightly stooped posture.  Pleasant  HENT:     Head: Normocephalic and atraumatic.     Right Ear: Tympanic membrane and ear canal normal.     Left Ear: Tympanic membrane and ear canal normal.     Ears:     Comments: Frontal and ethmoid sinuses are tender    Nose: Congestion and rhinorrhea present.     Mouth/Throat:     Mouth: Mucous membranes are moist.     Pharynx: No posterior oropharyngeal erythema.     Tonsils: No tonsillar exudate.  Eyes:     Conjunctiva/sclera: Conjunctivae normal.     Pupils: Pupils are equal, round, and reactive to light.  Cardiovascular:     Rate and Rhythm: Normal rate and regular rhythm.     Heart sounds: Murmur heard.  Pulmonary:     Effort: Pulmonary effort is normal. No respiratory distress.     Breath sounds: Normal breath sounds.  Abdominal:     General: There is no distension.     Palpations: Abdomen is soft.  Musculoskeletal:        General: Normal range of motion.     Cervical back: Normal range of motion.  Lymphadenopathy:     Cervical: No cervical adenopathy.  Skin:    General: Skin  is warm and dry.  Neurological:     Mental Status: She is alert.     UC Treatments / Results  Labs (all labs ordered are listed, but only abnormal results are displayed) Labs Reviewed - No data to display  EKG   Radiology No results found.  Procedures Procedures (including critical care time)  Medications Ordered in UC Medications - No data to display  Initial  Impression / Assessment and Plan / UC Course  I have reviewed the triage vital signs and the nursing notes.  Pertinent labs & imaging results that were available during my care of the patient were reviewed by me and considered in my medical decision making (see chart for details).     Viral illness with failure to improve on conservative management.  Patient states that she has been sick for the better part of a month.We will try an antibiotic.  Follow-up with primary care Final Clinical Impressions(s) / UC Diagnoses   Final diagnoses:  Acute recurrent sinusitis, unspecified location     Discharge Instructions      Continue to drink lots of water Use the saline (salt water) nasal rinse at least twice a day Take Augmentin antibiotic 2 times a day. If the Augmentin upset your stomach you may want to take a probiotic See your doctor if not improving by next week   ED Prescriptions     Medication Sig Dispense Auth. Provider   amoxicillin-clavulanate (AUGMENTIN) 875-125 MG tablet Take 1 tablet by mouth every 12 (twelve) hours. 14 tablet Raylene Everts, MD      PDMP not reviewed this encounter.   Raylene Everts, MD 08/01/21 [phone]

## 2021-08-03 DIAGNOSIS — M4316 Spondylolisthesis, lumbar region: Secondary | ICD-10-CM | POA: Diagnosis not present

## 2021-08-16 DIAGNOSIS — M48061 Spinal stenosis, lumbar region without neurogenic claudication: Secondary | ICD-10-CM | POA: Diagnosis not present

## 2021-08-16 DIAGNOSIS — M47816 Spondylosis without myelopathy or radiculopathy, lumbar region: Secondary | ICD-10-CM | POA: Diagnosis not present

## 2021-08-16 DIAGNOSIS — M4316 Spondylolisthesis, lumbar region: Secondary | ICD-10-CM | POA: Diagnosis not present

## 2021-08-19 DIAGNOSIS — Z1231 Encounter for screening mammogram for malignant neoplasm of breast: Secondary | ICD-10-CM | POA: Diagnosis not present

## 2021-08-19 LAB — HM MAMMOGRAPHY

## 2021-08-20 ENCOUNTER — Encounter: Payer: Self-pay | Admitting: Obstetrics & Gynecology

## 2021-08-24 ENCOUNTER — Encounter: Payer: Self-pay | Admitting: Internal Medicine

## 2021-08-30 ENCOUNTER — Encounter: Payer: Self-pay | Admitting: Internal Medicine

## 2021-08-30 ENCOUNTER — Telehealth: Payer: Self-pay

## 2021-08-30 ENCOUNTER — Ambulatory Visit (INDEPENDENT_AMBULATORY_CARE_PROVIDER_SITE_OTHER): Payer: Medicare Other | Admitting: Internal Medicine

## 2021-08-30 ENCOUNTER — Other Ambulatory Visit: Payer: Medicare Other

## 2021-08-30 VITALS — BP 138/80 | HR 61 | Temp 98.1°F | Ht 60.0 in | Wt 160.6 lb

## 2021-08-30 DIAGNOSIS — M810 Age-related osteoporosis without current pathological fracture: Secondary | ICD-10-CM

## 2021-08-30 DIAGNOSIS — E785 Hyperlipidemia, unspecified: Secondary | ICD-10-CM

## 2021-08-30 DIAGNOSIS — E039 Hypothyroidism, unspecified: Secondary | ICD-10-CM

## 2021-08-30 DIAGNOSIS — Z79899 Other long term (current) drug therapy: Secondary | ICD-10-CM | POA: Diagnosis not present

## 2021-08-30 DIAGNOSIS — R0981 Nasal congestion: Secondary | ICD-10-CM | POA: Diagnosis not present

## 2021-08-30 LAB — LIPID PANEL
Cholesterol: 199 mg/dL (ref 0–200)
HDL: 55.7 mg/dL (ref >39.00)
LDL Cholesterol: 120 mg/dL — ABNORMAL HIGH (ref 0–99)
NonHDL: 143.6
Total CHOL/HDL Ratio: 4
Triglycerides: 120 mg/dL (ref 0.0–149.0)
VLDL: 24 mg/dL (ref 0.0–40.0)

## 2021-08-30 LAB — VITAMIN D 25 HYDROXY (VIT D DEFICIENCY, FRACTURES): VITD: 107.04 ng/mL (ref 30.00–100.00)

## 2021-08-30 LAB — T4, FREE: Free T4: 1.32 ng/dL (ref 0.60–1.60)

## 2021-08-30 LAB — TSH: TSH: 1.15 u[IU]/mL (ref 0.35–5.50)

## 2021-08-30 NOTE — Telephone Encounter (Signed)
CRITICAL VALUE STICKER ? ?CRITICAL VALUE:   Vitamin D 107.04 ? ?RECEIVER (on-site recipient of call): ? ?DATE & TIME NOTIFIED:  08/30/2021 @ 3:17 pm ? ?MESSENGER (representative from lab): ? ?MD NOTIFIED:  Dr. Regis Bill  ? ?TIME OF NOTIFICATION: 3:17 pm ? ?RESPONSE: Message sent to PCP via Epic and Via teams ?

## 2021-08-30 NOTE — Progress Notes (Signed)
? ?Chief Complaint  ?Patient presents with  ? Follow-up  ? ? ?HPI: ?[name] [name] 81 y.o. come in for follow-up medications ?Overall she is doing okay. ? ?sinus infection finally gone 80 - 85 %  gone  still has drainage.  She thinks that might be baseline ongoing ?Zyrtec if needed into allergy season. ? ?Hypothyroid:  Thyroid medication every day .  No concerns takes every morning ?6 hours sleep . ? ?Osteoporosis: Still on Prolia taking vitamin D each day not sure if it is 500 or 1000 units may need a follow-up vitamin D level ? ?Neg tob  rare  etoh.  ?No falling   no depression.  ?Does have some back arthritis asked if there is other medicines besides gabapentin that would be helpful otherwise is taken Tylenol if needed.  Diagnosis and records spondylolisthesis of lumbar region ? ?ROS: See pertinent positives and negatives per HPI.  No chest pain shortness of breath although she walks up over 2 flights of stairs she might get winded.  No new symptoms. ? ?Past Medical History:  ?Diagnosis Date  ? COVID 10/25/2020  ? Dog bite of thigh 04/09/2012  ? Elevated lipids   ? GERD (gastroesophageal reflux disease)   ? Hypothyroidism   ? post thyroidectomy  ? Osteoarthritis   ? Osteopenia   ? t -2.5 hip in 2005 by Dexa  ? Osteoporosis   ? commpr fx after dog related fall  injury  ? Seasonal rhinitis   ? spring and fall  ? Skin cancer   ? squamous cell ca chest  ? Vaginal prolapse   ? hs corrected  ? ? ?Family History  ?Problem Relation Age of Onset  ? Heart attack Mother   ? Alcohol abuse Mother   ? Kidney cancer Father   ? Hypertension Maternal Grandmother   ? Esophageal cancer Neg Hx   ? Colon cancer Neg Hx   ? Pancreatic cancer Neg Hx   ? Stomach cancer Neg Hx   ? ? ?Social History  ? ?Socioeconomic History  ? Marital status: Widowed  ?  Spouse name: Not on file  ? Number of children: Not on file  ? Years of education: Not on file  ? Highest education level: Associate degree: academic program  ?Occupational History  ?  Not on file  ?Tobacco Use  ? Smoking status: Never  ? Smokeless tobacco: Never  ?Vaping Use  ? Vaping Use: Never used  ?Substance and Sexual Activity  ? Alcohol use: Yes  ?  Comment: occasionally  ? Drug use: No  ? Sexual activity: Not on file  ?Other Topics Concern  ? Not on file  ?Social History Narrative  ? 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 Regular Exercise- yes  ? Now on rx  ? Working  Full time    ?   ? hh of 2 cats and dogs in hh   ? ?Social Determinants of Health  ? ?Financial Resource Strain: Low Risk   ? Difficulty of Paying Living Expenses: Not hard at all  ?Food Insecurity: No Food Insecurity  ? Worried About Charity fundraiser in the Last Year: Never true  ? Ran Out of Food in the Last Year: Never true  ?Transportation Needs: No Transportation Needs  ? Lack of Transportation (Medical): No  ? Lack of Transportation (Non-Medical): No  ?Physical Activity: Sufficiently Active  ? Days of Exercise per Week: 5 days  ? Minutes of Exercise per Session: 60 min  ?  Stress: No Stress Concern Present  ? Feeling of Stress : Not at all  ?Social Connections: Moderately Integrated  ? Frequency of Communication with Friends and Family: More than three times a week  ? Frequency of Social Gatherings with Friends and Family: Once a week  ? Attends Religious Services: More than 4 times per year  ? Active Member of Clubs or Organizations: Yes  ? Attends Archivist Meetings: More than 4 times per year  ? Marital Status: Widowed  ? ? ?Outpatient Medications Prior to Visit  ?Medication Sig Dispense Refill  ? aspirin 325 MG EC tablet Take 325 mg by mouth every 6 (six) hours as needed for pain.    ? b complex vitamins tablet Take 1 tablet by mouth daily.    ? CALCIUM PO Take 1,200 mg by mouth daily.     ? cholecalciferol (VITAMIN D) 1000 UNITS tablet Take 1,000 Units by mouth every morning.    ? denosumab (PROLIA) 60 MG/ML SOLN injection Inject 60 mg into the skin every 6 (six) months. Administer in upper arm, thigh, or  abdomen    ? levothyroxine (SYNTHROID) 100 MCG tablet TAKE 1 TABLET(100 MCG) BY MOUTH DAILY 90 tablet 2  ? pantoprazole (PROTONIX) 20 MG tablet TAKE 1 TABLET(20 MG) BY MOUTH DAILY 90 tablet 1  ? amoxicillin-clavulanate (AUGMENTIN) 875-125 MG tablet Take 1 tablet by mouth every 12 (twelve) hours. (Patient not taking: Reported on 08/30/2021) 14 tablet 0  ? ?No facility-administered medications prior to visit.  ? ? ? ?EXAM: ? ?BP 138/80 (BP Location: Left Arm, Patient Position: Sitting, Cuff Size: Normal)   Pulse 61   Temp 98.1 ?F (36.7 ?C) (Oral)   Ht 5' (1.524 m)   Wt 160 lb 9.6 oz (72.8 kg)   SpO2 98%   BMI 31.37 kg/m?  ? ?Body mass index is 31.37 kg/m?. ? ?GENERAL: vitals reviewed and listed above, alert, oriented, appears well hydrated and in no acute distress ?HEENT: atraumatic, conjunctiva  clear, no obvious abnormalities on inspection of external nose and ears OP : n masked ?NECK: no obvious masses on inspection palpation  ?LUNGS: clear to auscultation bilaterally, no wheezes, rales or rhonchi, good air movement has some kyphosis ?CV: HRRR, no clubbing cyanosis or  peripheral edema nl cap refill  ?MS: moves all extremities ambulatory unassisted seems steady right foot has significant overriding hammer second toe ?PSYCH: pleasant and cooperative, no obvious depression or anxiety ?Lab Results  ?Component Value Date  ? WBC 8.4 07/08/2021  ? HGB 12.9 07/08/2021  ? HCT 40.0 07/08/2021  ? PLT 298.0 07/08/2021  ? GLUCOSE 95 07/08/2021  ? CHOL 186 09/23/2020  ? TRIG 64.0 09/23/2020  ? HDL 61.10 09/23/2020  ? LDLDIRECT 134.1 12/27/2006  ? LDLCALC 112 (H) 09/23/2020  ? ALT 18 07/08/2021  ? AST 30 07/08/2021  ? NA 143 07/08/2021  ? K 3.9 07/08/2021  ? CL 108 07/08/2021  ? CREATININE 0.77 07/08/2021  ? BUN 18 07/08/2021  ? CO2 29 07/08/2021  ? TSH 1.25 09/23/2020  ? INR 1.0 07/25/2007  ? HGBA1C 5.8 09/23/2020  ? ?BP Readings from Last 3 Encounters:  ?08/30/21 138/80  ?08/01/21 (!) 158/76  ?07/28/21 140/80  ?Lab plan  review is fasting today ?CT coronary score last year was 21st percentile. ?ASSESSMENT AND PLAN: ? ?Discussed the following assessment and plan: ? ?Medication management - Plan: TSH, Lipid panel, T4, free, VITAMIN D 25 Hydroxy (Vit-D Deficiency, Fractures), VITAMIN D 25 Hydroxy (Vit-D Deficiency, Fractures), T4,  free, Lipid panel, TSH ? ?Hypothyroidism, unspecified type - Plan: TSH, Lipid panel, T4, free, T4, free, Lipid panel, TSH ? ?Hyperlipidemia, unspecified hyperlipidemia type - Plan: TSH, Lipid panel, T4, free, T4, free, Lipid panel, TSH ? ?Age-related osteoporosis without current pathological fracture - On Prolia and vitamin D - Plan: VITAMIN D 25 Hydroxy (Vit-D Deficiency, Fractures), VITAMIN D 25 Hydroxy (Vit-D Deficiency, Fractures) ? ?Nasal congestion - bac to baseline ? after sinusitis  ? ?-Patient advised to return or notify health care team  if  new concerns arise. ? ?Patient Instructions  ?Good to see you today  ?Checking  thyroid and  cholesterol.  ?Get shingles vaccine  at pharmacy .  Shingrix.  ? ?Continue lifestyle intervention healthy eating and exercise .  ? ?Plan  yearly check or as indicated .  ? ? ?Standley Brooking. Kaiana Marion M.D. ?

## 2021-08-30 NOTE — Telephone Encounter (Signed)
Patient informed of message stated she will stop taking vitamin D and will call back to inform how much vitamin D she was taking  ?

## 2021-08-30 NOTE — Addendum Note (Signed)
Addended by: Nilda Riggs on: 08/30/2021 03:55 PM ? ? Modules accepted: Orders ? ?

## 2021-08-30 NOTE — Telephone Encounter (Signed)
Pt informed of the results and verbalized understanding.  ?

## 2021-08-30 NOTE — Telephone Encounter (Signed)
Tell patient to  stop the vitamin D supplement  n level is too high again . Please clarify /document   in the record what  dose of Vit D  she has actually been taking   .    Recheck vit d level in  2-4 weeks

## 2021-08-30 NOTE — Patient Instructions (Addendum)
Good to see you today  ?Checking  thyroid and  cholesterol.  ?Get shingles vaccine  at pharmacy .  Shingrix.  ? ?Continue lifestyle intervention healthy eating and exercise .  ? ?Plan  yearly check or as indicated .  ? ? ?

## 2021-08-30 NOTE — Telephone Encounter (Signed)
Future labs placed. 

## 2021-08-31 NOTE — Telephone Encounter (Signed)
Pt is calling back and she was taking vit d3 5000IU/125 mcg gummy form ?

## 2021-08-31 NOTE — Telephone Encounter (Signed)
Noted  

## 2021-09-05 ENCOUNTER — Other Ambulatory Visit: Payer: Self-pay | Admitting: Internal Medicine

## 2021-09-06 ENCOUNTER — Other Ambulatory Visit: Payer: Self-pay | Admitting: Internal Medicine

## 2021-09-06 MED ORDER — PANTOPRAZOLE SODIUM 20 MG PO TBEC: DELAYED_RELEASE_TABLET | ORAL | 1 refills | Status: DC

## 2021-09-06 NOTE — Telephone Encounter (Signed)
Pt requesting refill last Ov 08/30/21 ?Filled 8/29 ?Is it ok to refill? ?

## 2021-09-06 NOTE — Telephone Encounter (Signed)
Patient called in requesting a refill for pantoprazole (PROTONIX) 20 MG tablet [[id number]]  to be sent to her pharmacy. ? ?Please advise. ?

## 2021-09-10 DIAGNOSIS — H2513 Age-related nuclear cataract, bilateral: Secondary | ICD-10-CM | POA: Diagnosis not present

## 2021-09-10 DIAGNOSIS — D3132 Benign neoplasm of left choroid: Secondary | ICD-10-CM | POA: Diagnosis not present

## 2021-09-14 ENCOUNTER — Telehealth (INDEPENDENT_AMBULATORY_CARE_PROVIDER_SITE_OTHER): Payer: Medicare Other | Admitting: Family Medicine

## 2021-09-14 ENCOUNTER — Other Ambulatory Visit: Payer: Self-pay

## 2021-09-14 ENCOUNTER — Telehealth: Payer: Self-pay | Admitting: Internal Medicine

## 2021-09-14 DIAGNOSIS — R0981 Nasal congestion: Secondary | ICD-10-CM | POA: Diagnosis not present

## 2021-09-14 DIAGNOSIS — R059 Cough, unspecified: Secondary | ICD-10-CM | POA: Diagnosis not present

## 2021-09-14 DIAGNOSIS — Z20822 Contact with and (suspected) exposure to covid-19: Secondary | ICD-10-CM | POA: Diagnosis not present

## 2021-09-14 MED ORDER — AMOXICILLIN-POT CLAVULANATE 875-125 MG PO TABS: 1.0000 | ORAL_TABLET | Freq: Two times a day (BID) | ORAL | 0 refills | Status: DC

## 2021-09-14 NOTE — Telephone Encounter (Signed)
Patient calling in with respiratory symptoms: ?Shortness of breath, chest pain, palpitations or other red words send to Triage ? ?Does the patient have a fever over 100, cough, congestion, sore throat, runny nose, lost of taste/smell (please list symptoms that patient has)?sinus drainage, hoarseness ? ?What date did symptoms start?09-11-2021 ?(If over 5 days ago, pt may be scheduled for in person visit) ? ?Have you tested for Covid in the last 5 days? No  ? ?If yes, was it positive '[]'$  OR negative '[]'$ ? If positive in the last 5 days, please schedule virtual visit now. If negative, schedule for an in person OV with the next available provider if PCP has no openings. Please also let patient know they will be tested again (follow the script below) ? ?"you will have to arrive 3mns prior to your appt time to be Covid tested. Please park in back of office at the [facility] & call [phone] let the staff know you have arrived. A staff member will meet you at your car to do a rapid covid test. Once the test has resulted you will be notified by phone of your results to determine if appt will remain an in person visit or be converted to a virtual/phone visit. If you arrive less than 353ms before your appt time, your visit will be automatically converted to virtual & any recommended testing will happen AFTER the visit." ?Pt has virtual with dr kiMaudie Mercury-21-2023 1020 am ? ?THINGS TO REMEMBER ? ?If no availability for virtual visit in office,  please schedule another [facility] office ? ?If no [facility] another [facility], please instruct patient that they can schedule an evisit or virtual visit through their mychart account. Visits up to 8pm ? ?patients can be seen in office 5 days after positive COVID test ? ?  ?

## 2021-09-14 NOTE — Progress Notes (Signed)
Virtual Visit via Telephone Note ? ?I connected with [name] [name] on 09/14/21 at 10:20 AM EDT by telephone and verified that I am speaking with the correct person using two identifiers. ?  ?I discussed the limitations of performing an evaluation and management service by telephone and requested permission for a phone visit. The patient expressed understanding and agreed to proceed. ? ?Location patient:  [place] ?Location provider: work or home office ?Participants present for the call: patient, provider ?Patient did not have a visit with me in the prior 7 days to address this/these issue(s). ? ? ?History of Present Illness: ? ?Acute telemedicine visit for upper resp symptoms: ?-Onset: 3-4 days ago ?-Symptoms include: nasal congestion, pnd, mild cough, sneezing, runny eyes, mild SOB with exercising ?-[name] [name] is around a lot of people, granddaughter was sick recently - her doctor gave her a zpack - but 2 weeks ago ?-Denies:fever, body aches, CP, NVD ?-Pertinent past medical history: see below, had covid in the past and had many shots ?-Pertinent medication allergies:  ?Allergies  ?Allergen Reactions  ? Milk-Related Compounds Other (See Comments)  ? Onion Other (See Comments)  ? Other Other (See Comments)  ?  Green Peppers  ?-COVID-19 vaccine status: ?Immunization History  ?Administered Date(s) Administered  ? Fluad Quad(high Dose 65+) 04/21/2020  ? Influenza Split 03/08/2011, 03/20/2012  ? Influenza Whole 05/09/2007, 03/31/2008, 03/19/2010  ? Influenza, High Dose Seasonal PF 03/18/2016, 03/21/2017, 04/03/2018, 02/14/2019  ? Influenza,inj,Quad PF,6+ Mos 04/04/2014  ? PFIZER Comirnaty(Gray Top)Covid-19 Tri-Sucrose Vaccine 07/12/2019, 08/03/2019  ? PFIZER(Purple Top)SARS-COV-2 Vaccination 04/08/2020, 01/15/2021  ? Pneumococcal Conjugate-13 10/16/2013  ? Pneumococcal Polysaccharide-23 06/27/2001, 09/13/2007, 01/15/2009  ? Rabies, IM 11/11/2015, 11/18/2015, 11/22/2015, 11/29/2015  ? Td 12/27/2006  ? Tdap 11/11/2015  ? Zoster,  Live 03/31/2010  ? ? ?  ?Past Medical History:  ?Diagnosis Date  ? COVID 10/25/2020  ? Dog bite of thigh 04/09/2012  ? Elevated lipids   ? GERD (gastroesophageal reflux disease)   ? Hypothyroidism   ? post thyroidectomy  ? Osteoarthritis   ? Osteopenia   ? t -2.5 hip in 2005 by Dexa  ? Osteoporosis   ? commpr fx after dog related fall  injury  ? Seasonal rhinitis   ? spring and fall  ? Skin cancer   ? squamous cell ca chest  ? Vaginal prolapse   ? hs corrected  ? ? ?Current Outpatient Medications on File Prior to Visit  ?Medication Sig Dispense Refill  ? aspirin 325 MG EC tablet Take 325 mg by mouth every 6 (six) hours as needed for pain.    ? b complex vitamins tablet Take 1 tablet by mouth daily.    ? CALCIUM PO Take 1,200 mg by mouth daily.     ? cholecalciferol (VITAMIN D) 1000 UNITS tablet Take 1,000 Units by mouth every morning.    ? denosumab (PROLIA) 60 MG/ML SOLN injection Inject 60 mg into the skin every 6 (six) months. Administer in upper arm, thigh, or abdomen    ? levothyroxine (SYNTHROID) 100 MCG tablet TAKE 1 TABLET(100 MCG) BY MOUTH DAILY 90 tablet 2  ? pantoprazole (PROTONIX) 20 MG tablet TAKE 1 TABLET(20 MG) BY MOUTH DAILY 90 tablet 1  ? ?No current facility-administered medications on file prior to visit.  ? ? ?Observations/Objective: ?Patient sounds cheerful and well on the phone. ?I do not appreciate any SOB. ?Speech and thought processing are grossly intact. ?Patient reported vitals: O2 97, Temp ~ 97, BP 147/77 ? ?Assessment and Plan: ? ?Cough, unspecified  type ? ?Nasal congestion ? ?-we discussed possible serious and likely etiologies, options for evaluation and workup, limitations of telemedicine visit vs in person visit, treatment, treatment risks and precautions. Pt prefers to treat via telemedicine empirically rather than in person at this moment. [name] [name] feels strongly that [name] [name] need an abx as reports has required in the past for these symptoms. Discussed indications, likely dx, appropriate  abx use and risks. Advised likely VURI vs AR vs covid vs other. Advised covid testing and advised could contact [facility] pharmacy or do f/u vv if positive for tx. Also, discussed possibility allergies or VURI and management. [name] [name] agrees to do covid testing at home, saline sinus rinses, tessalon for cough (reports [name] [name] has some) and [name] [name] wants abx in case worsening or not improving as expected as is going out of town. Discussed signs and symptoms of bacterial resp illness, risks, etc and sent Augmentin.  ?Advised to seek prompt virtual visit or in person care if worsening, new symptoms arise, or if is not improving with treatment as expected per our conversation of expected course. Discussed options for follow up care.  ?  ?I discussed the assessment and treatment plan with the patient. The patient was provided an opportunity to ask questions and all were answered. The patient agreed with the plan and demonstrated an understanding of the instructions. ?  ? ?Follow Up Instructions: ? ?I did not refer this patient for an OV with me in the next 24 hours for this/these issue(s). ? ?I discussed the assessment and treatment plan with the patient. The patient was provided an opportunity to ask questions and all were answered. The patient agreed with the plan and demonstrated an understanding of the instructions. ? ? ?I spent 19 minutes on the date of this visit in the care of this patient. See summary of tasks completed to properly care for this patient in the detailed notes above which also included counseling of above, review of PMH, medications, allergies, evaluation of the patient and ordering and/or  instructing patient on testing and care options.   ? ? ?Lucretia Kern, DO  ? ?

## 2021-09-14 NOTE — Patient Instructions (Addendum)
? ? ?  HOME CARE TIPS: ? ?-COVID19 testing information: ?ForwardDrop.tn ? ?Most pharmacies also offer testing and home test kits. If the Covid19 test is positive and you desire antiviral treatment, please contact a [facility] or schedule a follow up virtual visit through your primary care office or through the [facility].  ?Other test to treat options: ?ConnectRV.is?click_source=alert ? ? ?Nasal saline rinses twice daily ? ?Could try an allergy pill such as allegra once daily ? ?-I sent the medication(s) we discussed to your pharmacy: ?Meds ordered this encounter  ?Medications  ? amoxicillin-clavulanate (AUGMENTIN) 875-125 MG tablet  ?  Sig: Take 1 tablet by mouth 2 (two) times daily.  ?  Dispense:  20 tablet  ?  Refill:  0  ?  ? ?-stay hydrated, drink plenty of fluids and eat small healthy meals - avoid dairy ? ?-can take 1000 IU (74mg) Vit D3 and 100-500 mg of Vit C daily per instructions ? ?-follow up with your doctor in 2-3 days unless improving and feeling better ? ?-stay home while sick, except to seek medical care. If you have COVID19, you will likely be contagious for 7-10 days. Flu or Influenza is likely contagious for about 7 days. Other respiratory viral infections remain contagious for 5-10+ days depending on the virus and many other factors. Wear a good mask that fits snugly (such as N95 or KN95) if around others to reduce the risk of transmission. ? ?It was nice to meet you today, and I really hope you are feeling better soon. I help [facility] out with telemedicine visits on Tuesdays and Thursdays and am happy to help if you need a follow up virtual visit on those days. Otherwise, if you have any concerns or questions following this visit please schedule a follow up visit with your Primary Care doctor or seek care at a local urgent care clinic to avoid delays in care.  ? ? ?Seek in person care or schedule a follow up video visit  promptly if your symptoms worsen, new concerns arise or you are not improving with treatment. Call 911 and/or seek emergency care if your symptoms are severe or life threatening. ? ? ?

## 2021-09-24 ENCOUNTER — Other Ambulatory Visit (INDEPENDENT_AMBULATORY_CARE_PROVIDER_SITE_OTHER): Payer: Medicare Other

## 2021-09-24 DIAGNOSIS — M810 Age-related osteoporosis without current pathological fracture: Secondary | ICD-10-CM | POA: Diagnosis not present

## 2021-09-24 LAB — VITAMIN D 25 HYDROXY (VIT D DEFICIENCY, FRACTURES): VITD: 86.23 ng/mL (ref 30.00–100.00)

## 2021-09-30 ENCOUNTER — Ambulatory Visit: Payer: Medicare Other | Admitting: Podiatry

## 2021-10-06 NOTE — Progress Notes (Signed)
vit d is now in normal range

## 2021-10-07 ENCOUNTER — Telehealth: Payer: Self-pay | Admitting: Internal Medicine

## 2021-10-07 ENCOUNTER — Telehealth: Payer: Self-pay | Admitting: *Deleted

## 2021-10-07 NOTE — Telephone Encounter (Addendum)
Deductible $226 ($226 met) ? ?OOP MAX n/a ? ?Annual exam 03/05/2021 ? ?Calcium 9.1             Date 07/08/21 ? ?Upcoming dental procedures No ? ?Prior Authorization needed no ? ?Pt estimated Cost $0 ? ? ?Appt 11/02/2021 ? ? ?Coverage Details:$0 one dose, $0 admin fee ? ?

## 2021-10-12 DIAGNOSIS — M47816 Spondylosis without myelopathy or radiculopathy, lumbar region: Secondary | ICD-10-CM | POA: Diagnosis not present

## 2021-10-12 DIAGNOSIS — M4316 Spondylolisthesis, lumbar region: Secondary | ICD-10-CM | POA: Diagnosis not present

## 2021-10-19 DIAGNOSIS — R293 Abnormal posture: Secondary | ICD-10-CM | POA: Diagnosis not present

## 2021-10-21 NOTE — Progress Notes (Signed)
If she was only taking 1000 iu per day  then  stop the vit d and will follow   please confirm dose of vit  D taken when lab was done.

## 2021-10-24 NOTE — Progress Notes (Signed)
So ok to  not take any x tra vit d supplement . At this time.

## 2021-10-27 DIAGNOSIS — M4316 Spondylolisthesis, lumbar region: Secondary | ICD-10-CM | POA: Diagnosis not present

## 2021-10-27 DIAGNOSIS — M47816 Spondylosis without myelopathy or radiculopathy, lumbar region: Secondary | ICD-10-CM | POA: Diagnosis not present

## 2021-11-01 ENCOUNTER — Other Ambulatory Visit: Payer: Self-pay | Admitting: Internal Medicine

## 2021-11-01 DIAGNOSIS — R293 Abnormal posture: Secondary | ICD-10-CM | POA: Diagnosis not present

## 2021-11-02 ENCOUNTER — Ambulatory Visit (INDEPENDENT_AMBULATORY_CARE_PROVIDER_SITE_OTHER): Payer: Medicare Other

## 2021-11-02 DIAGNOSIS — M810 Age-related osteoporosis without current pathological fracture: Secondary | ICD-10-CM

## 2021-11-02 MED ORDER — DENOSUMAB 60 MG/ML ~~LOC~~ SOSY
60.0000 mg | PREFILLED_SYRINGE | Freq: Once | SUBCUTANEOUS | Status: AC
  Administered 2021-11-02: 60 mg via SUBCUTANEOUS

## 2021-11-17 ENCOUNTER — Ambulatory Visit (INDEPENDENT_AMBULATORY_CARE_PROVIDER_SITE_OTHER): Payer: Medicare Other | Admitting: Internal Medicine

## 2021-11-17 ENCOUNTER — Encounter: Payer: Self-pay | Admitting: Internal Medicine

## 2021-11-17 ENCOUNTER — Ambulatory Visit (INDEPENDENT_AMBULATORY_CARE_PROVIDER_SITE_OTHER): Payer: Medicare Other

## 2021-11-17 VITALS — BP 130/74 | HR 67 | Temp 97.9°F | Ht 60.0 in | Wt 154.4 lb

## 2021-11-17 DIAGNOSIS — J398 Other specified diseases of upper respiratory tract: Secondary | ICD-10-CM

## 2021-11-17 DIAGNOSIS — R0600 Dyspnea, unspecified: Secondary | ICD-10-CM

## 2021-11-17 DIAGNOSIS — Z8249 Family history of ischemic heart disease and other diseases of the circulatory system: Secondary | ICD-10-CM | POA: Diagnosis not present

## 2021-11-17 DIAGNOSIS — Z9189 Other specified personal risk factors, not elsewhere classified: Secondary | ICD-10-CM

## 2021-11-17 NOTE — Progress Notes (Signed)
X ray shows no pneumonia  some old scarring  . They note some enlargement of cardiac shadow  ( not a definitive finding)  cardiology should contact  you about visit

## 2021-11-17 NOTE — Progress Notes (Signed)
Chief Complaint  Patient presents with   Eye Drainage    Mucus x 3 weeks    HPI: [name] [name] 81 y.o. come in for sneezing and watery eyes and now post nasal drainage.  Ocasscough  ? If sob  a lot of steps   not all the time.  Has been going on for weeks.  Uncertain cause did do a home COVID test within the past week and was negative. She noted [place] that she was more short of breath than usual.  Cough is occasional but not severe.  No chest pain otherwise Can get itchy eyes and weeping all of a sudden nasal drip and postnasal all of a sudden but no pain or fever. Tried Zyrtec once or twice and did help drying up but not taking regularly occasional itching of the eyes    ROS: See pertinent positives and negatives per HPI. Family history of heart disease in mother Past Medical History:  Diagnosis Date   COVID 10/25/2020   Dog bite of thigh 04/09/2012   Elevated lipids    GERD (gastroesophageal reflux disease)    Hypothyroidism    post thyroidectomy   Osteoarthritis    Osteopenia    t -2.5 hip in 2005 by Dexa   Osteoporosis    commpr fx after dog related fall  injury   Seasonal rhinitis    spring and fall   Skin cancer    squamous cell ca chest   Vaginal prolapse    hs corrected    Family History  Problem Relation Age of Onset   Heart attack Mother    Alcohol abuse Mother    Kidney cancer Father    Hypertension Maternal Grandmother    Esophageal cancer Neg Hx    Colon cancer Neg Hx    Pancreatic cancer Neg Hx    Stomach cancer Neg Hx     Social History   Socioeconomic History   Marital status: Widowed    Spouse name: Not on file   Number of children: Not on file   Years of education: Not on file   Highest education level: Associate degree: academic program  Occupational History   Not on file  Tobacco Use   Smoking status: Never   Smokeless tobacco: Never  Vaping Use   Vaping Use: Never used   Substance and Sexual Activity   Alcohol use: Yes    Comment: occasionally   Drug use: No   Sexual activity: Not on file  Other Topics Concern   Not on file  Social History Narrative   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   Regular Exercise- yes   Now on rx   Working  Full time        hh of 2 cats and dogs in hh    Social Determinants of Radio broadcast assistant Strain: Low Risk    Difficulty of Paying Living Expenses: Not hard at all  Food Insecurity: No Food Insecurity   Worried About Charity fundraiser in the Last Year: Never true   Arboriculturist in the Last Year: Never true  Transportation Needs: No Transportation Needs   Lack of Transportation (Medical): No   Lack of Transportation (Non-Medical): No  Physical Activity: Sufficiently Active   Days of Exercise per Week: 5 days   Minutes of Exercise per Session: 60 min  Stress: No Stress Concern Present   Feeling  of Stress : Not at all  Social Connections: Moderately Integrated   Frequency of Communication with Friends and Family: More than three times a week   Frequency of Social Gatherings with Friends and Family: Once a week   Attends Religious Services: More than 4 times per year   Active Member of [employer] or Organizations: Yes   Attends Archivist Meetings: More than 4 times per year   Marital Status: Widowed    Outpatient Medications Prior to Visit  Medication Sig Dispense Refill   aspirin 325 MG EC tablet Take 325 mg by mouth every 6 (six) hours as needed for pain.     b complex vitamins tablet Take 1 tablet by mouth daily.     CALCIUM PO Take 1,200 mg by mouth daily.      cholecalciferol (VITAMIN D) 1000 UNITS tablet Take 1,000 Units by mouth every morning.     denosumab (PROLIA) 60 MG/ML SOLN injection Inject 60 mg into the skin every 6 (six) months. Administer in upper arm, thigh, or abdomen     levothyroxine (SYNTHROID) 100 MCG tablet TAKE 1 TABLET(100 MCG) BY MOUTH DAILY 90 tablet 2    pantoprazole (PROTONIX) 20 MG tablet TAKE 1 TABLET(20 MG) BY MOUTH DAILY 90 tablet 1   amoxicillin-clavulanate (AUGMENTIN) 875-125 MG tablet Take 1 tablet by mouth 2 (two) times daily. (Patient not taking: Reported on 11/17/2021) 20 tablet 0   No facility-administered medications prior to visit.     EXAM:  BP 130/74 (BP Location: Left Arm, Patient Position: Sitting, Cuff Size: Normal)   Pulse 67   Temp 97.9 F (36.6 C) (Oral)   Ht 5' (1.524 m)   Wt 154 lb 6.4 oz (70 kg)   SpO2 94%   BMI 30.15 kg/m   Body mass index is 30.15 kg/m.  GENERAL: vitals reviewed and listed above, alert, oriented, appears well hydrated and in no acute distress mildly congested HEENT: atraumatic, conjunctiva  clear, no obvious abnormalities on inspection of external nose and ears TMs no acute findings nares congested no discharge currently face nontender without edema OP : no lesion edema or exudate  NECK: no obvious masses on inspection palpation  LUNGS: clear to auscultation bilaterally, no wheezes, rales or rhonchi, chest some kyphosis ease CV: HRRR, no clubbing cyanosis or  peripheral edema nl cap refill  MS: moves all extremities without noticeable focal  abnormality PSYCH: pleasant and cooperative, no obvious depression or anxiety Lab Results  Component Value Date   WBC 8.4 07/08/2021   HGB 12.9 07/08/2021   HCT 40.0 07/08/2021   PLT 298.0 07/08/2021   GLUCOSE 95 07/08/2021   CHOL 199 08/30/2021   TRIG 120.0 08/30/2021   HDL 55.70 08/30/2021   LDLDIRECT 134.1 12/27/2006   LDLCALC 120 (H) 08/30/2021   ALT 18 07/08/2021   AST 30 07/08/2021   NA 143 07/08/2021   K 3.9 07/08/2021   CL 108 07/08/2021   CREATININE 0.77 07/08/2021   BUN 18 07/08/2021   CO2 29 07/08/2021   TSH 1.15 08/30/2021   INR 1.0 07/25/2007   HGBA1C 5.8 09/23/2020   BP Readings from Last 3 Encounters:  11/17/21 130/74  08/30/21 138/80  08/01/21 (!) 158/76    ASSESSMENT AND PLAN:  Discussed the following  assessment and plan:  Congestion of upper respiratory tract - rhinorrhea and pnd and eye watering - Plan: DG Chest 2 View  Dyspnea, unspecified type - Plan: DG Chest 2 View, Ambulatory referral to Cardiology  Family  history of heart disease - Plan: Ambulatory referral to Cardiology  Cardiovascular risk factor - Plan: Ambulatory referral to Cardiology Many of the symptoms seem like allergy but ongoing uncertain change in baseline exercise tolerance seems to be subtle but could be wheezing phenomenon that is not noted on exam today. Also has kyphosis seeming stable could add to restrictive lung capacity Also she is interested in cardiovascular risk assessment because of family history of heart disease and would like to be proactive on further questioning she may have some intermittent shortness of breath without chest pain.   last coronary scan score was in the 20th percentile We will do refer to cardiology for help with risk assessment evaluation appropriateness of intervention. -Patient advised to return or notify health care team  if  new concerns arise.  Patient Instructions  This could be allergy sx. Checking chest x ray to make sure no  unexpected findings .   If ok then take zyrted or allegra or Claritin  ( generic ok)  every day for 1-2 weeks and then as needed.  Add nasal spray.  Flonase 2 spray each nostril  for 2 weeks( otc)  and then as needed .  This is a rx for allergy.  Congestion     Standley Brooking. Delia Sitar M.D.

## 2021-11-17 NOTE — Patient Instructions (Signed)
This could be allergy sx. Checking chest x ray to make sure no  unexpected findings .   If ok then take zyrted or allegra or Claritin  ( generic ok)  every day for 1-2 weeks and then as needed.  Add nasal spray.  Flonase 2 spray each nostril  for 2 weeks( otc)  and then as needed .  This is a rx for allergy.  Congestion

## 2021-11-23 DIAGNOSIS — R293 Abnormal posture: Secondary | ICD-10-CM | POA: Diagnosis not present

## 2021-11-24 ENCOUNTER — Ambulatory Visit (INDEPENDENT_AMBULATORY_CARE_PROVIDER_SITE_OTHER): Payer: Medicare Other | Admitting: Internal Medicine

## 2021-11-24 ENCOUNTER — Ambulatory Visit: Payer: Medicare Other | Admitting: Cardiovascular Disease

## 2021-11-24 ENCOUNTER — Encounter: Payer: Self-pay | Admitting: Internal Medicine

## 2021-11-24 VITALS — BP 140/80 | HR 72 | Ht 60.0 in | Wt 153.8 lb

## 2021-11-24 DIAGNOSIS — R6889 Other general symptoms and signs: Secondary | ICD-10-CM

## 2021-11-24 DIAGNOSIS — R0609 Other forms of dyspnea: Secondary | ICD-10-CM

## 2021-11-24 NOTE — Progress Notes (Signed)
Cardiology Office Note:    Date:  11/24/2021   ID:  [name] [name], DOB 1940-10-23, MRN [id number]  PCP:  Burnis Medin, MD   [facility] Providers Cardiologist:  None     Referring MD: Burnis Medin, MD   No chief complaint on file. DOE  History of Present Illness:    [name] [name] is a 81 y.o. female with a hx of hypothyroidism on synthroid and GERD referral for dyspnea. She saw Dr. Burt Knack in 2012 for CP and palpations. Echo at that time was normal. Crt is normal. She notes that with walking her dogs she has no noticed decreased exercise intolerance. She has allergy symptoms and is unsure if it's related to this her mother had CAD in her 83s and she is concerned. She denies chest pressure.  No CHF symptoms. No syncope. She is still working and very active.  Past Medical History:  Diagnosis Date   COVID 10/25/2020   Dog bite of thigh 04/09/2012   Elevated lipids    GERD (gastroesophageal reflux disease)    Hypothyroidism    post thyroidectomy   Osteoarthritis    Osteopenia    t -2.5 hip in 2005 by Dexa   Osteoporosis    commpr fx after dog related fall  injury   Seasonal rhinitis    spring and fall   Skin cancer    squamous cell ca chest   Vaginal prolapse    hs corrected    Past Surgical History:  Procedure Laterality Date   ABDOMINAL HYSTERECTOMY  07/2007   bladders sling surgery  07/2007   COLONOSCOPY  2006   THYROIDECTOMY      Current Medications: No outpatient medications have been marked as taking for the 11/24/21 encounter (Appointment) with Janina Mayo, MD.     Allergies:   Milk-related compounds, Onion, and Other   Social History   Socioeconomic History   Marital status: Widowed    Spouse name: Not on file   Number of children: Not on file   Years of education: Not on file   Highest education level: Associate degree: academic program  Occupational History   Not on file  Tobacco Use   Smoking status: Never   Smokeless tobacco: Never   Vaping Use   Vaping Use: Never used  Substance and Sexual Activity   Alcohol use: Yes    Comment: occasionally   Drug use: No   Sexual activity: Not on file  Other Topics Concern   Not on file  Social History Narrative   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   Regular Exercise- yes   Now on rx   Working  Full time        hh of 2 cats and dogs in [place]    Social Determinants of Radio broadcast assistant Strain: Low Risk    Difficulty of Paying Living Expenses: Not hard at all  Food Insecurity: No Food Insecurity   Worried About Charity fundraiser in the Last Year: Never true   Arboriculturist in the Last Year: Never true  Transportation Needs: No Transportation Needs   Lack of Transportation (Medical): No   Lack of Transportation (Non-Medical): No  Physical Activity: Sufficiently Active   Days of Exercise per Week: 5 days   Minutes of Exercise per Session: 60 min  Stress: No Stress Concern Present   Feeling of Stress : Not at all  Social Connections: Moderately Integrated   Frequency of  Communication with Friends and Family: More than three times a week   Frequency of Social Gatherings with Friends and Family: Once a week   Attends Religious Services: More than 4 times per year   Active Member of [employer] or Organizations: Yes   Attends Archivist Meetings: More than 4 times per year   Marital Status: Widowed     Family History: The patient's family history includes Alcohol abuse in her mother; Heart attack in her mother; Hypertension in her maternal grandmother; Kidney cancer in her father. There is no history of Esophageal cancer, Colon cancer, Pancreatic cancer, or Stomach cancer.  ROS:   Please see the history of present illness.     All other systems reviewed and are negative.  EKGs/Labs/Other Studies Reviewed:    The following studies were reviewed today:   EKG:  EKG is  ordered today.  The ekg ordered today demonstrates   NSR, poor R wave  progression  Recent Labs: 07/08/2021: ALT 18; BUN 18; Creatinine, Ser 0.77; Hemoglobin 12.9; Platelets 298.0; Potassium 3.9; Sodium 143 08/30/2021: TSH 1.15  Recent Lipid Panel    Component Value Date/Time   CHOL 199 08/30/2021 1119   TRIG 120.0 08/30/2021 1119   HDL 55.70 08/30/2021 1119   CHOLHDL 4 08/30/2021 1119   VLDL 24.0 08/30/2021 1119   LDLCALC 120 (H) 08/30/2021 1119   LDLDIRECT 134.1 12/27/2006 1414     Risk Assessment/Calculations:           Physical Exam:    VS:   Vitals:   11/24/21 1314  BP: 140/80  Pulse: 72  SpO2: 100%     Wt Readings from Last 3 Encounters:  11/17/21 154 lb 6.4 oz (70 kg)  08/30/21 160 lb 9.6 oz (72.8 kg)  08/01/21 153 lb (69.4 kg)     GEN:  Well nourished, well developed in no acute distress HEENT: Normal NECK: No JVD; No carotid bruits LYMPHATICS: No lymphadenopathy CARDIAC: RRR, no murmurs, rubs, gallops RESPIRATORY:  Clear to auscultation without rales, wheezing or rhonchi  ABDOMEN: Soft, non-tender, non-distended MUSCULOSKELETAL:  No edema; No deformity  SKIN: Warm and dry NEUROLOGIC:  Alert and oriented x 3 PSYCHIATRIC:  Normal affect   ASSESSMENT:    Decreased exercise tolerance: Notes decreased exercise tolerance and her CVD risk includes age. Will plan for an exercise stress test. Her prior echo was normal and no signs of valve disease or CHF.    PLAN:    In order of problems listed above:  Exercise nuclear stress Follow up as needed pending results      Shared Decision Making/Informed Consent The risks [chest pain, shortness of breath, cardiac arrhythmias, dizziness, blood pressure fluctuations, myocardial infarction, stroke/transient ischemic attack, nausea, vomiting, allergic reaction, radiation exposure, metallic taste sensation and life-threatening complications (estimated to be 1 in 10,000)], benefits (risk stratification, diagnosing coronary artery disease, treatment guidance) and alternatives of a  nuclear stress test were discussed in detail with [name] [name] and she agrees to proceed.    Medication Adjustments/Labs and Tests Ordered: Current medicines are reviewed at length with the patient today.  Concerns regarding medicines are outlined above.  No orders of the defined types were placed in this encounter.  No orders of the defined types were placed in this encounter.   There are no Patient Instructions on file for this visit.   Signed, Janina Mayo, MD  11/24/2021 8:26 AM    [facility]

## 2021-11-24 NOTE — Patient Instructions (Signed)
Medication Instructions:  No changes  *If you need a refill on your cardiac medications before your next appointment, please call your pharmacy*   Lab Work: None ordered If you have labs (blood work) drawn today and your tests are completely normal, you will receive your results only by: [place] (if you have MyChart) OR A paper copy in the mail If you have any lab test that is abnormal or we need to change your treatment, we will call you to review the results.   Testing/Procedures: Your physician has requested that you have an exercise stress myoview. For further information please visit [email or web address]. Please follow instruction sheet, as given. This will take place at the [facility] office  How to prepare for your Myocardial Perfusion Test: Do not eat or drink 3 hours prior to your test, except you may have water. Do not consume products containing caffeine (regular or decaffeinated) 12 hours prior to your test. (ex: coffee, chocolate, sodas, tea). Do bring a list of your current medications with you.  If not listed below, you may take your medications as normal. Do wear comfortable clothes (no dresses or overalls) and walking shoes, tennis shoes preferred (No heels or open toe shoes are allowed). Do NOT wear cologne, perfume, aftershave, or lotions (deodorant is allowed). The test will take approximately 3 to 4 hours to complete If these instructions are not followed, your test will have to be rescheduled.   Follow-Up: At [facility], you and your health needs are our priority.  As part of our continuing mission to provide you with exceptional heart care, we have created designated Provider Care Teams.  These Care Teams include your primary Cardiologist (physician) and Advanced Practice Providers (APPs -  Physician Assistants and Nurse Practitioners) who all work together to provide you with the care you need, when you need it.  We recommend signing up for the patient  portal called "MyChart".  Sign up information is provided on this After Visit Summary.  MyChart is used to connect with patients for Virtual Visits (Telemedicine).  Patients are able to view lab/test results, encounter notes, upcoming appointments, etc.  Non-urgent messages can be sent to your provider as well.   To learn more about what you can do with MyChart, go to [email or web address].    Your next appointment:   Follow up as needed with Dr. Harl Bowie, pending results

## 2021-11-25 ENCOUNTER — Telehealth (HOSPITAL_COMMUNITY): Payer: Self-pay

## 2021-11-25 NOTE — Telephone Encounter (Signed)
Spoke with the patient, detailed instructions were given. She stated that she would be here for her test. Asked to call back with any questions. S.[name] EMTP 

## 2021-11-30 ENCOUNTER — Ambulatory Visit (HOSPITAL_COMMUNITY): Payer: Medicare Other | Attending: Cardiology

## 2021-11-30 DIAGNOSIS — R6889 Other general symptoms and signs: Secondary | ICD-10-CM | POA: Insufficient documentation

## 2021-11-30 DIAGNOSIS — R0609 Other forms of dyspnea: Secondary | ICD-10-CM | POA: Insufficient documentation

## 2021-11-30 LAB — MYOCARDIAL PERFUSION IMAGING
Angina Index: 0
Duke Treadmill Score: 4
Estimated workload: 4.6
Exercise duration (min): 3 min
Exercise duration (sec): 31 s
LV dias vol: 56 mL (ref 46–106)
LV sys vol: 21 mL
MPHR: 140 {beats}/min
Nuc Stress EF: 62 %
Peak HR: 151 {beats}/min
Percent HR: 108 %
RPE: 19
Rest HR: 78 {beats}/min
Rest Nuclear Isotope Dose: 10.5 mCi
SDS: 0
SRS: 2
SSS: 2
ST Depression (mm): 0 mm
Stress Nuclear Isotope Dose: 30.3 mCi
TID: 0.89

## 2021-11-30 MED ORDER — TECHNETIUM TC 99M TETROFOSMIN IV KIT
10.5000 | PACK | Freq: Once | INTRAVENOUS | Status: AC | PRN
Start: 2021-11-30 — End: 2021-11-30
  Administered 2021-11-30: 10.5 via INTRAVENOUS

## 2021-11-30 MED ORDER — TECHNETIUM TC 99M TETROFOSMIN IV KIT
30.3000 | PACK | Freq: Once | INTRAVENOUS | Status: AC | PRN
Start: 2021-11-30 — End: 2021-11-30
  Administered 2021-11-30: 30.3 via INTRAVENOUS

## 2021-12-03 ENCOUNTER — Other Ambulatory Visit: Payer: Self-pay

## 2021-12-03 ENCOUNTER — Telehealth: Payer: Self-pay | Admitting: Internal Medicine

## 2021-12-03 DIAGNOSIS — I10 Essential (primary) hypertension: Secondary | ICD-10-CM

## 2021-12-03 DIAGNOSIS — Z79899 Other long term (current) drug therapy: Secondary | ICD-10-CM

## 2021-12-03 MED ORDER — CHLORTHALIDONE 25 MG PO TABS
25.0000 mg | ORAL_TABLET | Freq: Every day | ORAL | 0 refills | Status: DC
Start: 2021-12-03 — End: 2022-02-04

## 2021-12-03 MED ORDER — AMLODIPINE BESYLATE 5 MG PO TABS: 5.0000 mg | ORAL_TABLET | Freq: Every day | ORAL | 0 refills | Status: DC

## 2021-12-03 NOTE — Telephone Encounter (Addendum)
Gave patient results of stress test per Dr. Harl Bowie..."Please let [name] [name] know that her stress test did not show any coronary disease.  Her blood pressure increases with exercise. Recommend she start chlorthalidone 25 mg daily and norvasc 5 mg daily. Please schedule an appointment with pharmacy for blood pressure assessment and medication uptitration if needed. Please advise her to check her blood pressure once per day for a week and keep track of the measurements for her visit."  Medications explained to patient, as well as procedure on checking BP/keeping a diary. Informed her that pharmacy will call her to schedule her for BP assessment and medication titration. Patient voiced understanding. Orders placed.  Patient stated her BP runs 120-130 over 70-80. There have been times when it runs 98/65. Recommended to patient if her BP is around 105, to advise clinic before taking BP med. She voiced understanding.

## 2021-12-03 NOTE — Telephone Encounter (Signed)
Patient was returning a call. Please advise  

## 2021-12-07 ENCOUNTER — Ambulatory Visit (INDEPENDENT_AMBULATORY_CARE_PROVIDER_SITE_OTHER): Payer: Medicare Other

## 2021-12-07 VITALS — BP 140/70 | HR 84 | Temp 97.8°F | Ht 59.5 in | Wt 153.3 lb

## 2021-12-07 DIAGNOSIS — Z0000 Encounter for general adult medical examination without abnormal findings: Secondary | ICD-10-CM | POA: Diagnosis not present

## 2021-12-07 NOTE — Patient Instructions (Signed)
[name] [name] , Thank you for taking time to come for your Medicare Wellness Visit. I appreciate your ongoing commitment to your health goals. Please review the following plan we discussed and let me know if I can assist you in the future.   Screening recommendations/referrals: Colonoscopy: not required Mammogram: completed 08/19/2021, due 08/20/2022 Bone Density: completed 05/25/2020 Recommended yearly ophthalmology/optometry visit for glaucoma screening and checkup Recommended yearly dental visit for hygiene and checkup  Vaccinations: Influenza vaccine: due 01/25/2022 Pneumococcal vaccine: completed 10/16/2013 Tdap vaccine: completed 11/11/2015, due 11/10/2025 Shingles vaccine: discussed   Covid-19: 01/15/2021, 04/08/2020, 08/03/2019, 07/12/2019  Advanced directives: Please bring a copy of your POA (Power of Attorney) and/or Living Will to your next appointment.   Conditions/risks identified: none  Next appointment: Follow up in one year for your annual wellness visit    Preventive Care 65 Years and Older, Female Preventive care refers to lifestyle choices and visits with your health care provider that can promote health and wellness. What does preventive care include? A yearly physical exam. This is also called an annual well check. Dental exams once or twice a year. Routine eye exams. Ask your health care provider how often you should have your eyes checked. Personal lifestyle choices, including: Daily care of your teeth and gums. Regular physical activity. Eating a healthy diet. Avoiding tobacco and drug use. Limiting alcohol use. Practicing safe sex. Taking low-dose aspirin every day. Taking vitamin and mineral supplements as recommended by your health care provider. What happens during an annual well check? The services and screenings done by your health care provider during your annual well check will depend on your age, overall health, lifestyle risk factors, and family history  of disease. Counseling  Your health care provider may ask you questions about your: Alcohol use. Tobacco use. Drug use. Emotional well-being. Home and relationship well-being. Sexual activity. Eating habits. History of falls. Memory and ability to understand (cognition). Work and work Statistician. Reproductive health. Screening  You may have the following tests or measurements: Height, weight, and BMI. Blood pressure. Lipid and cholesterol levels. These may be checked every 5 years, or more frequently if you are over 18 years old. Skin check. Lung cancer screening. You may have this screening every year starting at age 34 if you have a 30-pack-year history of smoking and currently smoke or have quit within the past 15 years. Fecal occult blood test (FOBT) of the stool. You may have this test every year starting at age 43. Flexible sigmoidoscopy or colonoscopy. You may have a sigmoidoscopy every 5 years or a colonoscopy every 10 years starting at age 49. Hepatitis C blood test. Hepatitis B blood test. Sexually transmitted disease (STD) testing. Diabetes screening. This is done by checking your blood sugar (glucose) after you have not eaten for a while (fasting). You may have this done every 1-3 years. Bone density scan. This is done to screen for osteoporosis. You may have this done starting at age 39. Mammogram. This may be done every 1-2 years. Talk to your health care provider about how often you should have regular mammograms. Talk with your health care provider about your test results, treatment options, and if necessary, the need for more tests. Vaccines  Your health care provider may recommend certain vaccines, such as: Influenza vaccine. This is recommended every year. Tetanus, diphtheria, and acellular pertussis (Tdap, Td) vaccine. You may need a Td booster every 10 years. Zoster vaccine. You may need this after age 52. Pneumococcal 13-valent conjugate (PCV13)  vaccine. One  dose is recommended after age 34. Pneumococcal polysaccharide (PPSV23) vaccine. One dose is recommended after age 43. Talk to your health care provider about which screenings and vaccines you need and how often you need them. This information is not intended to replace advice given to you by your health care provider. Make sure you discuss any questions you have with your health care provider. Document Released: 07/10/2015 Document Revised: 03/02/2016 Document Reviewed: 04/14/2015 Elsevier Interactive Patient Education  2017 [place] Prevention in the Home Falls can cause injuries. They can happen to people of all ages. There are many things you can do to make your home safe and to help prevent falls. What can I do on the outside of my home? Regularly fix the edges of walkways and driveways and fix any cracks. Remove anything that might make you trip as you walk through a door, such as a raised step or threshold. Trim any bushes or trees on the path to your home. Use bright outdoor lighting. Clear any walking paths of anything that might make someone trip, such as rocks or tools. Regularly check to see if handrails are loose or broken. Make sure that both sides of any steps have handrails. Any raised decks and porches should have guardrails on the edges. Have any leaves, snow, or ice cleared regularly. Use sand or salt on walking paths during winter. Clean up any spills in your garage right away. This includes oil or grease spills. What can I do in the bathroom? Use night lights. Install grab bars by the toilet and in the tub and shower. Do not use towel bars as grab bars. Use non-skid mats or decals in the tub or shower. If you need to sit down in the shower, use a plastic, non-slip stool. Keep the floor dry. Clean up any water that spills on the floor as soon as it happens. Remove soap buildup in the tub or shower regularly. Attach bath mats securely with double-sided  non-slip rug tape. Do not have throw rugs and other things on the floor that can make you trip. What can I do in the bedroom? Use night lights. Make sure that you have a light by your bed that is easy to reach. Do not use any sheets or blankets that are too big for your bed. They should not hang down onto the floor. Have a firm chair that has side arms. You can use this for support while you get dressed. Do not have throw rugs and other things on the floor that can make you trip. What can I do in the kitchen? Clean up any spills right away. Avoid walking on wet floors. Keep items that you use a lot in easy-to-reach places. If you need to reach something above you, use a strong step stool that has a grab bar. Keep electrical cords out of the way. Do not use floor polish or wax that makes floors slippery. If you must use wax, use non-skid floor wax. Do not have throw rugs and other things on the floor that can make you trip. What can I do with my stairs? Do not leave any items on the stairs. Make sure that there are handrails on both sides of the stairs and use them. Fix handrails that are broken or loose. Make sure that handrails are as long as the stairways. Check any carpeting to make sure that it is firmly attached to the stairs. Fix any carpet that is loose  or worn. Avoid having throw rugs at the top or bottom of the stairs. If you do have throw rugs, attach them to the floor with carpet tape. Make sure that you have a light switch at the top of the stairs and the bottom of the stairs. If you do not have them, ask someone to add them for you. What else can I do to help prevent falls? Wear shoes that: Do not have high heels. Have rubber bottoms. Are comfortable and fit you well. Are closed at the toe. Do not wear sandals. If you use a stepladder: Make sure that it is fully opened. Do not climb a closed stepladder. Make sure that both sides of the stepladder are locked into place. Ask  someone to hold it for you, if possible. Clearly mark and make sure that you can see: Any grab bars or handrails. First and last steps. Where the edge of each step is. Use tools that help you move around (mobility aids) if they are needed. These include: Canes. Walkers. Scooters. Crutches. Turn on the lights when you go into a dark area. Replace any light bulbs as soon as they burn out. Set up your furniture so you have a clear path. Avoid moving your furniture around. If any of your floors are uneven, fix them. If there are any pets around you, be aware of where they are. Review your medicines with your doctor. Some medicines can make you feel dizzy. This can increase your chance of falling. Ask your doctor what other things that you can do to help prevent falls. This information is not intended to replace advice given to you by your health care provider. Make sure you discuss any questions you have with your health care provider. Document Released: 04/09/2009 Document Revised: 11/19/2015 Document Reviewed: 07/18/2014 Elsevier Interactive Patient Education  2017 [employer].

## 2021-12-07 NOTE — Progress Notes (Signed)
Subjective:   [name] [name] is a 81 y.o. female who presents for Medicare Annual (Subsequent) preventive examination.  Review of Systems     Cardiac Risk Factors include: advanced age (>58mn, >>66women);obesity (BMI >30kg/m2)     Objective:    Today's Vitals   12/07/21 1106  BP: 140/70  Pulse: 84  Temp: 97.8 F (36.6 C)  TempSrc: Oral  SpO2: 96%  Weight: 153 lb 4.8 oz (69.5 kg)  Height: 4' 11.5" (1.511 m)   Body mass index is 30.44 kg/m.     12/07/2021   11:16 AM 11/24/2020    2:49 PM 09/27/2017   11:32 AM  Advanced Directives  Does Patient Have a Medical Advance Directive? Yes Yes Yes  Type of AParamedicof [place] will [place] Chart? No - copy requested No - copy requested     Current Medications (verified) Outpatient Encounter Medications as of 12/07/2021  Medication Sig   aspirin 325 MG EC tablet Take 325 mg by mouth every 6 (six) hours as needed for pain.   b complex vitamins tablet Take 1 tablet by mouth daily.   CALCIUM PO Take 1,200 mg by mouth daily.    denosumab (PROLIA) 60 MG/ML SOLN injection Inject 60 mg into the skin every 6 (six) months. Administer in upper arm, thigh, or abdomen   levothyroxine (SYNTHROID) 100 MCG tablet TAKE 1 TABLET(100 MCG) BY MOUTH DAILY   pantoprazole (PROTONIX) 20 MG tablet TAKE 1 TABLET(20 MG) BY MOUTH DAILY   amLODipine (NORVASC) 5 MG tablet Take 1 tablet (5 mg total) by mouth daily. (Patient not taking: Reported on 12/07/2021)   chlorthalidone (HYGROTON) 25 MG tablet Take 1 tablet (25 mg total) by mouth daily. (Patient not taking: Reported on 12/07/2021)   cholecalciferol (VITAMIN D) 1000 UNITS tablet Take 1,000 Units by mouth every morning. (Patient not taking: Reported on 11/24/2021)   No facility-administered encounter medications on file as of 12/07/2021.    Allergies (verified) Milk-related compounds, Onion, and Other    History: Past Medical History:  Diagnosis Date   COVID 10/25/2020   Dog bite of thigh 04/09/2012   Elevated lipids    GERD (gastroesophageal reflux disease)    Hypothyroidism    post thyroidectomy   Osteoarthritis    Osteopenia    t -2.5 hip in 2005 by Dexa   Osteoporosis    commpr fx after dog related fall  injury   Seasonal rhinitis    spring and fall   Skin cancer    squamous cell ca chest   Vaginal prolapse    hs corrected   Past Surgical History:  Procedure Laterality Date   ABDOMINAL HYSTERECTOMY  07/2007   bladders sling surgery  07/2007   COLONOSCOPY  2006   THYROIDECTOMY     Family History  Problem Relation Age of Onset   Heart attack Mother    Alcohol abuse Mother    Kidney cancer Father    Hypertension Maternal Grandmother    Esophageal cancer Neg Hx    Colon cancer Neg Hx    Pancreatic cancer Neg Hx    Stomach cancer Neg Hx    Social History   Socioeconomic History   Marital status: Widowed    Spouse name: Not on file   Number of children: Not on file   Years of education: Not on file   Highest education level: Associate degree: academic program  Occupational  History   Not on file  Tobacco Use   Smoking status: Never   Smokeless tobacco: Never  Vaping Use   Vaping Use: Never used  Substance and Sexual Activity   Alcohol use: Yes    Comment: occasionally   Drug use: No   Sexual activity: Not on file  Other Topics Concern   Not on file  Social History Narrative   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   Regular Exercise- yes   Now on rx   Working  Full time        hh of 2 cats and dogs in hh    Social Determinants of Health   Financial Resource Strain: Low Risk  (12/07/2021)   Overall Financial Resource Strain (CARDIA)    Difficulty of Paying Living Expenses: Not hard at all  Food Insecurity: No Food Insecurity (12/07/2021)   Hunger Vital Sign    Worried About Running Out of Food in the Last Year: Never true    Ran Out of Food in the  Last Year: Never true  Transportation Needs: No Transportation Needs (12/07/2021)   PRAPARE - Hydrologist (Medical): No    Lack of Transportation (Non-Medical): No  Physical Activity: Sufficiently Active (12/07/2021)   Exercise Vital Sign    Days of Exercise per Week: 7 days    Minutes of Exercise per Session: 30 min  Stress: No Stress Concern Present (12/07/2021)   [place]    Feeling of Stress : Not at all  Social Connections: Moderately Integrated (05/06/2021)   Social Connection and Isolation Panel [NHANES]    Frequency of Communication with Friends and Family: More than three times a week    Frequency of Social Gatherings with Friends and Family: Once a week    Attends Religious Services: More than 4 times per year    Active Member of [employer] or Organizations: Yes    Attends Archivist Meetings: More than 4 times per year    Marital Status: Widowed    Tobacco Counseling Counseling given: Not Answered   Clinical Intake:  Pre-visit preparation completed: Yes  Pain : No/denies pain     Nutritional Status: BMI > 30  Obese Nutritional Risks: None Diabetes: No  How often do you need to have someone help you when you read instructions, pamphlets, or other written materials from your doctor or pharmacy?: 1 - Never What is the last grade level you completed in school?: COLLEGE  Diabetic? no  Interpreter Needed?: No  Information entered by :: NAllen LPN   Activities of Daily Living    12/07/2021   11:19 AM  In your present state of health, do you have any difficulty performing the following activities:  Hearing? 0  Vision? 0  Difficulty concentrating or making decisions? 0  Walking or climbing stairs? 0  Dressing or bathing? 0  Doing errands, shopping? 0  Preparing Food and eating ? N  Using the Toilet? N  In the past six months, have you accidently leaked  urine? N  Do you have problems with loss of bowel control? N  Managing your Medications? N  Managing your Finances? N  Housekeeping or managing your Housekeeping? N    Patient Care Team: Panosh, Standley Brooking, MD as PCP - General Princess Bruins, MD as Attending Physician (Obstetrics and Gynecology) Addison Lank, MD as Referring Physician (Dermatology) Sandrea Hughs, [place], [facility] as Pharmacist (Pharmacist) Dellis Filbert,  Truddie Hidden, MD as Consulting Physician (Obstetrics and Gynecology)  Indicate any recent Medical Services you may have received from other than [facility] providers in the past year (date may be approximate).     Assessment:   This is a routine wellness examination for [name] [name].  Hearing/Vision screen Vision Screening - Comments:: Regular eye exams, [facility]  Dietary issues and exercise activities discussed: Current Exercise Habits: Home exercise routine, Type of exercise: walking, Time (Minutes): 30, Frequency (Times/Week): 7, Weekly Exercise (Minutes/Week): 210   Goals Addressed             This Visit's Progress    Patient Stated       12/07/2021, wants to start getting back to the gym       Depression Screen    12/07/2021   11:18 AM 11/17/2021   11:00 AM 07/08/2021    1:13 PM 11/24/2020    2:46 PM 09/23/2020   11:48 AM 05/14/2019   10:02 AM 05/11/2018   10:15 AM  PHQ 2/9 Scores  PHQ - 2 Score 0 0 0 1 0 0 0  PHQ- 9 Score  0         Fall Risk    12/07/2021   11:17 AM 11/17/2021   11:00 AM 05/06/2021   10:08 PM 11/24/2020    2:50 PM 09/23/2020   11:47 AM  Fall Risk   Falls in the past year? 1 0 1 0 0  Comment dogs pulled her      Number falls in past yr: 1 0 0 0 0  Injury with Fall? 0 0 0 0 0  Risk for fall due to : Medication side effect No Fall Risks  Impaired vision   Follow up Falls evaluation completed;Education provided;Falls prevention discussed Falls evaluation completed  Falls prevention discussed     FALL RISK PREVENTION PERTAINING TO THE  HOME:  Any stairs in or around the home? Yes  If so, are there any without handrails? No  Home free of loose throw rugs in walkways, pet beds, electrical cords, etc? Yes  Adequate lighting in your home to reduce risk of falls? Yes   ASSISTIVE DEVICES UTILIZED TO PREVENT FALLS:  Life alert? No  Use of a cane, walker or w/c? No  Grab bars in the bathroom? No  Shower chair or bench in shower? Yes  Elevated toilet seat or a handicapped toilet? No   TIMED UP AND GO:  Was the test performed? No .      Cognitive Function:        12/07/2021   11:20 AM 11/24/2020    2:52 PM  6CIT Screen  What Year? 0 points 0 points  What month? 0 points 0 points  What time? 0 points 0 points  Count back from 20 0 points 0 points  Months in reverse 0 points 0 points  Repeat phrase 4 points 0 points  Total Score 4 points 0 points    Immunizations Immunization History  Administered Date(s) Administered   Fluad Quad(high Dose 65+) 04/21/2020   Influenza Split 03/08/2011, 03/20/2012   Influenza Whole 05/09/2007, 03/31/2008, 03/19/2010   Influenza, High Dose Seasonal PF 03/18/2016, 03/21/2017, 04/03/2018, 02/14/2019   Influenza,inj,Quad PF,6+ Mos 04/04/2014   PFIZER Comirnaty(Gray Top)Covid-19 Tri-Sucrose Vaccine 07/12/2019, 08/03/2019   PFIZER(Purple Top)SARS-COV-2 Vaccination 04/08/2020, 01/15/2021   Pneumococcal Conjugate-13 10/16/2013   Pneumococcal Polysaccharide-23 06/27/2001, 09/13/2007, 01/15/2009   Rabies, IM 11/11/2015, 11/18/2015, 11/22/2015, 11/29/2015   Td 12/27/2006   Tdap 11/11/2015   Zoster, Live  03/31/2010    TDAP status: Up to date  Flu Vaccine status: Up to date  Pneumococcal vaccine status: Up to date  Covid-19 vaccine status: Completed vaccines  Qualifies for Shingles Vaccine? Yes   Zostavax completed Yes   Shingrix Completed?: No.    Education has been provided regarding the importance of this vaccine. Patient has been advised to call insurance company to  determine out of pocket expense if they have not yet received this vaccine. Advised may also receive vaccine at local pharmacy or Health Dept. Verbalized acceptance and understanding.  Screening Tests Health Maintenance  Topic Date Due   COVID-19 Vaccine (5 - Booster for Pfizer series) 03/02/2022 (Originally 03/12/2021)   Zoster Vaccines- Shingrix (1 of 2) 03/02/2022 (Originally 08/07/1959)   INFLUENZA VACCINE  01/25/2022   TETANUS/TDAP  11/10/2025   Pneumonia Vaccine 68+ Years old  Completed   DEXA SCAN  Completed   HPV VACCINES  Aged Out    Health Maintenance  There are no preventive care reminders to display for this patient.  Colorectal cancer screening: No longer required.   Mammogram status: Completed 08/19/2021. Repeat every year  Bone Density status: Completed 05/25/2020  Lung Cancer Screening: (Low Dose CT Chest recommended if Age 37-80 years, 30 pack-year currently smoking OR have quit w/in 15years.) does not qualify.   Lung Cancer Screening Referral: no  Additional Screening:  Hepatitis C Screening: does not qualify;   Vision Screening: Recommended annual ophthalmology exams for early detection of glaucoma and other disorders of the eye. Is the patient up to date with their annual eye exam?  Yes  Who is the provider or what is the name of the office in which the patient attends annual eye exams? [facility] If pt is not established with a provider, would they like to be referred to a provider to establish care? No .   Dental Screening: Recommended annual dental exams for proper oral hygiene  Community Resource Referral / Chronic Care Management: CRR required this visit?  No   CCM required this visit?  No      Plan:     I have personally reviewed and noted the following in the patient's chart:   Medical and social history Use of alcohol, tobacco or illicit drugs  Current medications and supplements including opioid prescriptions.  Functional ability and  status Nutritional status Physical activity Advanced directives List of other physicians Hospitalizations, surgeries, and ER visits in previous 12 months Vitals Screenings to include cognitive, depression, and falls Referrals and appointments  In addition, I have reviewed and discussed with patient certain preventive protocols, quality metrics, and best practice recommendations. A written personalized care plan for preventive services as well as general preventive health recommendations were provided to patient.     Kellie Simmering, LPN   [id number]   Nurse Notes: none

## 2021-12-09 DIAGNOSIS — M4316 Spondylolisthesis, lumbar region: Secondary | ICD-10-CM | POA: Diagnosis not present

## 2021-12-10 NOTE — Telephone Encounter (Signed)
error 

## 2021-12-16 ENCOUNTER — Ambulatory Visit: Payer: Medicare Other

## 2021-12-20 DIAGNOSIS — J0100 Acute maxillary sinusitis, unspecified: Secondary | ICD-10-CM | POA: Diagnosis not present

## 2021-12-28 NOTE — Progress Notes (Unsigned)
No chief complaint on file.   HPI: [name] [name] 81 y.o. come in for fu  bp    meds began y cardiology for  ht response to  exercise  ROS: See pertinent positives and negatives per HPI.  Past Medical History:  Diagnosis Date   COVID 10/25/2020   Dog bite of thigh 04/09/2012   Elevated lipids    GERD (gastroesophageal reflux disease)    Hypothyroidism    post thyroidectomy   Osteoarthritis    Osteopenia    t -2.5 hip in 2005 by Dexa   Osteoporosis    commpr fx after dog related fall  injury   Seasonal rhinitis    spring and fall   Skin cancer    squamous cell ca chest   Vaginal prolapse    hs corrected    Family History  Problem Relation Age of Onset   Heart attack Mother    Alcohol abuse Mother    Kidney cancer Father    Hypertension Maternal Grandmother    Esophageal cancer Neg Hx    Colon cancer Neg Hx    Pancreatic cancer Neg Hx    Stomach cancer Neg Hx     Social History   Socioeconomic History   Marital status: Widowed    Spouse name: Not on file   Number of children: Not on file   Years of education: Not on file   Highest education level: Associate degree: academic program  Occupational History   Not on file  Tobacco Use   Smoking status: Never   Smokeless tobacco: Never  Vaping Use   Vaping Use: Never used  Substance and Sexual Activity   Alcohol use: Yes    Comment: occasionally   Drug use: No   Sexual activity: Not on file  Other Topics Concern   Not on file  Social History Narrative   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   Regular Exercise- yes   Now on rx   Working  Full time        hh of 2 cats and dogs in hh    Social Determinants of Health   Financial Resource Strain: Low Risk  (12/07/2021)   Overall Financial Resource Strain (CARDIA)    Difficulty of Paying Living Expenses: Not hard at all  Food Insecurity: No Food Insecurity (12/07/2021)   Hunger Vital Sign    Worried About Running Out of Food in the Last Year: Never true     [place] in the Last Year: Never true  Transportation Needs: No Transportation Needs (12/07/2021)   PRAPARE - Hydrologist (Medical): No    Lack of Transportation (Non-Medical): No  Physical Activity: Sufficiently Active (12/07/2021)   Exercise Vital Sign    Days of Exercise per Week: 7 days    Minutes of Exercise per Session: 30 min  Stress: No Stress Concern Present (12/07/2021)   [place]    Feeling of Stress : Not at all  Social Connections: Moderately Integrated (05/06/2021)   Social Connection and Isolation Panel [NHANES]    Frequency of Communication with Friends and Family: More than three times a week    Frequency of Social Gatherings with Friends and Family: Once a week    Attends Religious Services: More than 4 times per year    Active Member of [employer] or Organizations: Yes    Attends Archivist Meetings: More than 4  times per year    Marital Status: Widowed    Outpatient Medications Prior to Visit  Medication Sig Dispense Refill   amLODipine (NORVASC) 5 MG tablet Take 1 tablet (5 mg total) by mouth daily. (Patient not taking: Reported on 12/07/2021) 90 tablet 0   aspirin 325 MG EC tablet Take 325 mg by mouth every 6 (six) hours as needed for pain.     b complex vitamins tablet Take 1 tablet by mouth daily.     CALCIUM PO Take 1,200 mg by mouth daily.      chlorthalidone (HYGROTON) 25 MG tablet Take 1 tablet (25 mg total) by mouth daily. (Patient not taking: Reported on 12/07/2021) 90 tablet 0   cholecalciferol (VITAMIN D) 1000 UNITS tablet Take 1,000 Units by mouth every morning. (Patient not taking: Reported on 11/24/2021)     denosumab (PROLIA) 60 MG/ML SOLN injection Inject 60 mg into the skin every 6 (six) months. Administer in upper arm, thigh, or abdomen     levothyroxine (SYNTHROID) 100 MCG tablet TAKE 1 TABLET(100 MCG) BY MOUTH DAILY 90 tablet 2   pantoprazole  (PROTONIX) 20 MG tablet TAKE 1 TABLET(20 MG) BY MOUTH DAILY 90 tablet 1   No facility-administered medications prior to visit.     EXAM:  There were no vitals taken for this visit.  There is no height or weight on file to calculate BMI.  GENERAL: vitals reviewed and listed above, alert, oriented, appears well hydrated and in no acute distress HEENT: atraumatic, conjunctiva  clear, no obvious abnormalities on inspection of external nose and ears OP : no lesion edema or exudate  NECK: no obvious masses on inspection palpation  LUNGS: clear to auscultation bilaterally, no wheezes, rales or rhonchi, good air movement CV: HRRR, no clubbing cyanosis or  peripheral edema nl cap refill  MS: moves all extremities without noticeable focal  abnormality PSYCH: pleasant and cooperative, no obvious depression or anxiety Lab Results  Component Value Date   WBC 8.4 07/08/2021   HGB 12.9 07/08/2021   HCT 40.0 07/08/2021   PLT 298.0 07/08/2021   GLUCOSE 95 07/08/2021   CHOL 199 08/30/2021   TRIG 120.0 08/30/2021   HDL 55.70 08/30/2021   LDLDIRECT 134.1 12/27/2006   LDLCALC 120 (H) 08/30/2021   ALT 18 07/08/2021   AST 30 07/08/2021   NA 143 07/08/2021   K 3.9 07/08/2021   CL 108 07/08/2021   CREATININE 0.77 07/08/2021   BUN 18 07/08/2021   CO2 29 07/08/2021   TSH 1.15 08/30/2021   INR 1.0 07/25/2007   HGBA1C 5.8 09/23/2020   BP Readings from Last 3 Encounters:  12/07/21 140/70  11/24/21 140/80  11/17/21 130/74    ASSESSMENT AND PLAN:  Discussed the following assessment and plan:  No diagnosis found.  -Patient advised to return or notify health care team  if  new concerns arise.  There are no Patient Instructions on file for this visit.   Standley Brooking. Roizy Harold M.D.

## 2021-12-29 ENCOUNTER — Ambulatory Visit (INDEPENDENT_AMBULATORY_CARE_PROVIDER_SITE_OTHER): Payer: Medicare Other | Admitting: Internal Medicine

## 2021-12-29 ENCOUNTER — Encounter: Payer: Self-pay | Admitting: Internal Medicine

## 2021-12-29 VITALS — BP 160/78 | HR 77 | Temp 98.2°F | Ht 59.5 in | Wt 153.2 lb

## 2021-12-29 DIAGNOSIS — Z79899 Other long term (current) drug therapy: Secondary | ICD-10-CM | POA: Diagnosis not present

## 2021-12-29 DIAGNOSIS — R0989 Other specified symptoms and signs involving the circulatory and respiratory systems: Secondary | ICD-10-CM | POA: Diagnosis not present

## 2021-12-29 NOTE — Patient Instructions (Signed)
Begin the amlodipine  of you want can try 1/2 dose 2.5  initially  But bp is high in office.  Not sure how much is white coat effect.   Based on your readings and UC readings.   So  make a visit nurse in a week with monitor  about BP check . OR at the [facility] clinic  to go from there.  Glad you are doing well.

## 2022-01-05 ENCOUNTER — Ambulatory Visit (INDEPENDENT_AMBULATORY_CARE_PROVIDER_SITE_OTHER): Payer: Medicare Other | Admitting: Family Medicine

## 2022-01-05 ENCOUNTER — Encounter: Payer: Self-pay | Admitting: Family Medicine

## 2022-01-05 ENCOUNTER — Ambulatory Visit: Payer: Medicare Other

## 2022-01-05 VITALS — BP 138/65 | HR 88 | Temp 98.3°F | Wt 151.2 lb

## 2022-01-05 DIAGNOSIS — J302 Other seasonal allergic rhinitis: Secondary | ICD-10-CM | POA: Diagnosis not present

## 2022-01-05 DIAGNOSIS — R0989 Other specified symptoms and signs involving the circulatory and respiratory systems: Secondary | ICD-10-CM

## 2022-01-05 NOTE — Progress Notes (Signed)
Subjective:    Patient ID: [name] [name], female    DOB: [birth year], 81 y.o.   MRN: [id number]  Chief Complaint  Patient presents with   Hypertension    HPI Patient was seen today for f/u on HTN.  Pt followed by Dr. Regis Bill.  H/o labile BP.  Elevated in clinic and during exercise test with Cardiology.  Pt advised to take chlorthalidone 25 mg and norvasc 5 mg.  Pt was hesitant to do this per last OFV with PCP 12/29/21.  Pt started taking Norvasc 2.5 mg on 12/29/21.  BP at home 135/77, 129/65, 128/78, 141/78, 142/80, 137/66, 149/78, 130/66, 134/73, 136/79, 121/62.  Patient denies headaches, changes in vision, CP, LE edema, dizziness.  Patient never picked up chlorthalidone.  Patient expresses hesitancy in taking BP meds as she read it can cause drowsiness.  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  Patient endorses being nervous prior to having her BP checked at appointments as she knows it will be high.  Patient does not drink much water throughout the day.  Has appointment with HTN clinic next week.  Patient inquires about allergy referral to [facility] and allergy clinic.  Endorses worsening allergy symptoms.  Tried Zyrtec in the past which caused drowsiness.  Also tried nasal sprays without success.  Past Medical History:  Diagnosis Date   COVID 10/25/2020   Dog bite of thigh 04/09/2012   Elevated lipids    GERD (gastroesophageal reflux disease)    Hypothyroidism    post thyroidectomy   Osteoarthritis    Osteopenia    t -2.5 hip in 2005 by Dexa   Osteoporosis    commpr fx after dog related fall  injury   Seasonal rhinitis    spring and fall   Skin cancer    squamous cell ca chest   Vaginal prolapse    hs corrected    Allergies  Allergen Reactions   Milk-Related Compounds Other (See Comments)   Onion Other (See Comments)   Other Other (See Comments)    Green Peppers    ROS General: Denies fever, chills, night sweats, changes in weight,  changes in appetite HEENT: Denies headaches, ear pain, changes in vision, rhinorrhea, sore throat CV: Denies CP, palpitations, SOB, orthopnea Pulm: Denies SOB, cough, wheezing GI: Denies abdominal pain, nausea, vomiting, diarrhea, constipation GU: Denies dysuria, hematuria, frequency, vaginal discharge Msk: Denies muscle cramps, joint pains Neuro: Denies weakness, numbness, tingling Skin: Denies rashes, bruising Psych: Denies depression, anxiety, hallucinations     Objective:    Blood pressure 138/65, pulse 88, temperature 98.3 F (36.8 C), temperature source Oral, weight 151 lb 3.2 oz (68.6 kg), SpO2 94 %.  Initial BP reading 150/80.  Patient's home, 135/79.  BP rechecked by this provider 138/65.  Gen. Pleasant, well-nourished, in no distress, normal affect   HEENT: [place]/AT, face symmetric, conjunctiva clear, no scleral icterus, PERRLA, EOMI, nares patent without drainage Lungs: no accessory muscle use, CTAB, no wheezes or rales Cardiovascular: RRR, no m/r/g, no peripheral edema Musculoskeletal: No deformities, no cyanosis or clubbing, normal tone Neuro:  A&Ox3, CN II-XII intact, normal gait Skin:  Warm, no lesions/ rash   Wt Readings from Last 3 Encounters:  01/05/22 151 lb 3.2 oz (68.6 kg)  12/29/21 153 lb 3.2 oz (69.5 kg)  12/07/21 153 lb 4.8 oz (69.5 kg)    Lab Results  Component Value Date   WBC 8.4 07/08/2021   HGB 12.9 07/08/2021  HCT 40.0 07/08/2021   PLT 298.0 07/08/2021   GLUCOSE 95 07/08/2021   CHOL 199 08/30/2021   TRIG 120.0 08/30/2021   HDL 55.70 08/30/2021   LDLDIRECT 134.1 12/27/2006   LDLCALC 120 (H) 08/30/2021   ALT 18 07/08/2021   AST 30 07/08/2021   NA 143 07/08/2021   K 3.9 07/08/2021   CL 108 07/08/2021   CREATININE 0.77 07/08/2021   BUN 18 07/08/2021   CO2 29 07/08/2021   TSH 1.15 08/30/2021   INR 1.0 07/25/2007   HGBA1C 5.8 09/23/2020    Assessment/Plan:  Labile hypertension -Controlled on recheck. -Initially elevated at 150/80.  Pt  home monitoring 135/79.  When rechecked by this provider 138/65. -BP elevation likely multifactorial including whitecoat component as well as dehydration. -Lifestyle modifications encouraged.  Patient encouraged to drink water and regular meals throughout the day -Continue Norvasc 2.5 mg daily. -Continue monitoring BP at home. -Given precautions  Seasonal allergies -Discussed other OTC allergy medications pt can try including half tab Allegra, Xyzal, Nasacort. -Also discussed Singulair as an option. -Referral to [facility] allergy placed at patient's request. - Plan: Ambulatory referral to Allergy  F/u with PCP in the next few weeks for BP  Grier Mitts, MD

## 2022-01-11 DIAGNOSIS — R293 Abnormal posture: Secondary | ICD-10-CM | POA: Diagnosis not present

## 2022-01-12 ENCOUNTER — Ambulatory Visit (INDEPENDENT_AMBULATORY_CARE_PROVIDER_SITE_OTHER): Payer: Medicare Other | Admitting: Pharmacist

## 2022-01-12 VITALS — BP 150/72 | HR 72 | Ht 60.0 in | Wt 151.8 lb

## 2022-01-12 DIAGNOSIS — Z79899 Other long term (current) drug therapy: Secondary | ICD-10-CM

## 2022-01-12 DIAGNOSIS — I10 Essential (primary) hypertension: Secondary | ICD-10-CM

## 2022-01-12 MED ORDER — AMLODIPINE BESYLATE 5 MG PO TABS: 5.0000 mg | ORAL_TABLET | Freq: Every day | ORAL | 0 refills | Status: DC

## 2022-01-12 NOTE — Patient Instructions (Signed)
It was nice meeting you today  We would like your blood pressure to be less than 130/80  Please increase your amlodipine to '5mg'$  once in the evening  Continue to monitor your blood pressure at home.  You can continue to hold your chlorthalidone  Please call with any questions and we will see you back in 2-3 weeks  Karren Cobble, PharmD, [place], [place], [place], [place], [place], [address] Phone: [phone], Fax: [phone]

## 2022-01-12 NOTE — Progress Notes (Unsigned)
Patient ID: [name] [name]                 DOB: 02-Mar-1941                      MRN: [id number]     HPI: [name] [name] is a 81 y.o. female referred by Dr. Harl Bowie to HTN clinic. PMH is significant for HTN and palpitations. On  stress test it was noted her blood pressure increased with exercise and she was referred to HTN clinic.  Patient presents today in good spirits. Is still working. 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  Estimates it takes about 90% of her day so she is not physically active.   Was prescribed chlorthalidone '25mg'$  and amlodipine '5mg'$ . However she has been taking amlodipine 2.'5mg'$  because she was nervous about hypotension while driving.  Brought log:  149/78 131/67 134/73 130/66 123/64 136/79 129/63  Drinks 1 cup of starbucks coffee in the morning. Does not add extra salt to food and does not eat processed foods.  Reports blood pressure increases are likely due to stress.  Has not taken blood pressure medications yet this morning.  Current HTN meds:  Amlodipine 2.'5mg'$  (prescribed '5mg'$ ) Chlorthalidone '25mg'$  daily  BP goal: <130/9-  Wt Readings from Last 3 Encounters:  01/12/22 151 lb 12.8 oz (68.9 kg)  01/05/22 151 lb 3.2 oz (68.6 kg)  12/29/21 153 lb 3.2 oz (69.5 kg)   BP Readings from Last 3 Encounters:  01/12/22 (!) 150/72  01/05/22 138/65  12/29/21 (!) 160/78   Pulse Readings from Last 3 Encounters:  01/12/22 72  01/05/22 88  12/29/21 77    Renal function: CrCl cannot be calculated (Patient's most recent lab result is older than the maximum 21 days allowed.).  Past Medical History:  Diagnosis Date   COVID 10/25/2020   Dog bite of thigh 04/09/2012   Elevated lipids    GERD (gastroesophageal reflux disease)    Hypothyroidism    post thyroidectomy   Osteoarthritis    Osteopenia    t -2.5 hip in 2005 by Dexa   Osteoporosis    commpr fx after dog related fall  injury   Seasonal rhinitis    spring and fall   Skin cancer     squamous cell ca chest   Vaginal prolapse    hs corrected    Current Outpatient Medications on File Prior to Visit  Medication Sig Dispense Refill   amLODipine (NORVASC) 5 MG tablet Take 1 tablet (5 mg total) by mouth daily. (Patient taking differently: Take 2.5 mg by mouth daily. Taking 2.5 mg) 90 tablet 0   aspirin 325 MG EC tablet Take 325 mg by mouth every 6 (six) hours as needed for pain.     b complex vitamins tablet Take 1 tablet by mouth daily.     CALCIUM PO Take 1,200 mg by mouth daily.      denosumab (PROLIA) 60 MG/ML SOLN injection Inject 60 mg into the skin every 6 (six) months. Administer in upper arm, thigh, or abdomen     levothyroxine (SYNTHROID) 100 MCG tablet TAKE 1 TABLET(100 MCG) BY MOUTH DAILY 90 tablet 2   pantoprazole (PROTONIX) 20 MG tablet TAKE 1 TABLET(20 MG) BY MOUTH DAILY 90 tablet 1   chlorthalidone (HYGROTON) 25 MG tablet Take 1 tablet (25 mg total) by mouth daily. 90 tablet 0   No current facility-administered medications on file prior to visit.  Allergies  Allergen Reactions   Milk-Related Compounds Other (See Comments)   Onion Other (See Comments)   Other Other (See Comments)    Green Peppers     Assessment/Plan:  1. Hypertension -  HYPERTENSION CONTROL Vitals:   01/12/22 1027 01/12/22 1041  BP: (!) 164/82 (!) 150/72    The patient's blood pressure is elevated above target today. {Click here to indicate intervention taken to address high BP  Refresh Note :1}  The following intervention was performed to address the patient's elevated BP:  - A current anti-hypertensive medication was adjusted today.    Patient BP today 164/82 which is above goal of <130/80 and increased since last visit. However she has not taken her medications yet today.    Was prescribed amlodipine '5mg'$  but has only been taking 2.'5mg'$ . Will increase today to originally prescribed dose and recommended she continue to check BP at home. Recheck in 1 month.  Continue  chlorthalidone '25mg'$  daily Increase amlodipine to '5mg'$  daily Recheck in 4 weeks.

## 2022-01-24 ENCOUNTER — Encounter: Payer: Self-pay | Admitting: Pharmacist

## 2022-02-04 ENCOUNTER — Ambulatory Visit (INDEPENDENT_AMBULATORY_CARE_PROVIDER_SITE_OTHER): Payer: Medicare Other | Admitting: Pharmacist

## 2022-02-04 VITALS — BP 151/84 | HR 75 | Wt 150.4 lb

## 2022-02-04 DIAGNOSIS — R6889 Other general symptoms and signs: Secondary | ICD-10-CM

## 2022-02-04 DIAGNOSIS — I10 Essential (primary) hypertension: Secondary | ICD-10-CM | POA: Diagnosis not present

## 2022-02-04 NOTE — Patient Instructions (Signed)
It was nice seeing you again  We would like to keep your blood pressure less than 130/80  Your home readings look wonderful.  Continue your amlodipine '5mg'$  once in the evening  Please bring your home cuff next week so we can verify it is accurate  Let us know if you have any questions!  Karren Cobble, PharmD, BCACP, [place], [place], [place], [place], [address] Phone: [phone], Fax: [phone]

## 2022-02-04 NOTE — Progress Notes (Unsigned)
Patient ID: Carle Dargan                 DOB: [birth year]                      MRN: [id number]     HPI: Marylynn Rigdon is a 81 y.o. female referred by Dr. Harl Bowie to HTN clinic. PMH is significant for HTN and palpitations. On  stress test it was noted her blood pressure increased with exercise and she was referred to HTN clinic.  Patient ss still working. 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  Estimates it takes about 90% of her day so she is not physically active.   Was prescribed chlorthalidone '25mg'$  and amlodipine '5mg'$ . However she has been taking amlodipine 2.'5mg'$  because she was nervous about hypotension while driving.  She never started chlorthalidone. At last visit, amlodipine was increased to '5mg'$  as originally prescribed.  Still did not want to start chlorthalidone due to worry about having to urinate while driving.  Patient presents today in good spirits. Since increasing amlodipine she reports she has not been as out of breath and has more energy/  Blood pressure has decreased as well.  Brought log. Recent readings:  135/68 119/64 121/66 128/66 123/72 128/65 127/67  Did not bring home blood pressure cuff   Current HTN meds:  Amlodipine '5mg'$  daily  BP goal: <130/80  Wt Readings from Last 3 Encounters:  01/12/22 151 lb 12.8 oz (68.9 kg)  01/05/22 151 lb 3.2 oz (68.6 kg)  12/29/21 153 lb 3.2 oz (69.5 kg)   BP Readings from Last 3 Encounters:  01/12/22 (!) 150/72  01/05/22 138/65  12/29/21 (!) 160/78   Pulse Readings from Last 3 Encounters:  01/12/22 72  01/05/22 88  12/29/21 77    Renal function: CrCl cannot be calculated (Patient's most recent lab result is older than the maximum 21 days allowed.).  Past Medical History:  Diagnosis Date   COVID 10/25/2020   Dog bite of thigh 04/09/2012   Elevated lipids    GERD (gastroesophageal reflux disease)    Hypothyroidism    post thyroidectomy   Osteoarthritis    Osteopenia    t -2.5 hip in  2005 by Dexa   Osteoporosis    commpr fx after dog related fall  injury   Seasonal rhinitis    spring and fall   Skin cancer    squamous cell ca chest   Vaginal prolapse    hs corrected    Current Outpatient Medications on File Prior to Visit  Medication Sig Dispense Refill   amLODipine (NORVASC) 5 MG tablet Take 1 tablet (5 mg total) by mouth daily. (Patient taking differently: Take 2.5 mg by mouth daily. Taking 2.5 mg) 90 tablet 0   aspirin 325 MG EC tablet Take 325 mg by mouth every 6 (six) hours as needed for pain.     b complex vitamins tablet Take 1 tablet by mouth daily.     CALCIUM PO Take 1,200 mg by mouth daily.      denosumab (PROLIA) 60 MG/ML SOLN injection Inject 60 mg into the skin every 6 (six) months. Administer in upper arm, thigh, or abdomen     levothyroxine (SYNTHROID) 100 MCG tablet TAKE 1 TABLET(100 MCG) BY MOUTH DAILY 90 tablet 2   pantoprazole (PROTONIX) 20 MG tablet TAKE 1 TABLET(20 MG) BY MOUTH DAILY 90 tablet 1   chlorthalidone (HYGROTON) 25 MG tablet Take  1 tablet (25 mg total) by mouth daily. 90 tablet 0   No current facility-administered medications on file prior to visit.    Allergies  Allergen Reactions   Milk-Related Compounds Other (See Comments)   Onion Other (See Comments)   Other Other (See Comments)    Green Peppers     Assessment/Plan:  1. Hypertension -  HYPERTENSION CONTROL Vitals:  HYPERTENSION CONTROL Vitals:   02/04/22 1039 02/04/22 1052  BP: (!) 155/84 (!) 151/84    The patient's blood pressure is elevated above target today. {Click here if intervention needs to be changed Refresh Note :1}  In order to address the patient's elevated BP: The blood pressure is usually elevated in clinic.  Blood pressures monitored at home have been optimal.   .... Patient BP in room 151/84 which is above goal of <130/80 however home readings at goal.  Advised patient bring cuff to next follow up appointment to verify accuracy.  If home  readings accurate, no med changes needed. Will continue amlodipine '5mg'$  at this time and recheck in 1 week.  Continue amlodipine '5mg'$  daily Check BP in 1 week  Karren Cobble, PharmD, [place], [place], [place], [place], [place], [address] Phone: [phone], Fax: [phone]

## 2022-02-07 ENCOUNTER — Encounter: Payer: Self-pay | Admitting: Pharmacist

## 2022-02-08 ENCOUNTER — Encounter: Payer: Self-pay | Admitting: Pharmacist

## 2022-02-08 ENCOUNTER — Ambulatory Visit (INDEPENDENT_AMBULATORY_CARE_PROVIDER_SITE_OTHER): Payer: Medicare Other | Admitting: Pharmacist

## 2022-02-08 VITALS — BP 144/72 | HR 74

## 2022-02-08 DIAGNOSIS — I10 Essential (primary) hypertension: Secondary | ICD-10-CM

## 2022-02-08 NOTE — Patient Instructions (Addendum)
Good seeing you again  Your home cuff appears accurate.  Continue your amlodipine '5mg'$  once a day  Continue checking your blood pressure at home.  If it begins to trend greater than 130/80 please give [place] a call  Please call with any questions!   Karren Cobble, PharmD, [place], [place], [place], [place], [place], [address] Phone: [phone], Fax: [phone]

## 2022-02-08 NOTE — Progress Notes (Signed)
Patient ID: [name] [name]                 DOB: 17-Feb-1941                      MRN: [id number]     HPI: [name] [name] is a 81 y.o. female referred by Dr. Harl Bowie to HTN clinic. PMH is significant for HTN and SOB.  Seen last week for HTN management and asked to bring in home cuff to verify.  Patient presents today with home cuff.  Had patient demonstrate in room.  Reading: 149/72   Current HTN meds:  Amlodipine '5mg'$  daily  Wt Readings from Last 3 Encounters:  02/04/22 150 lb 6.4 oz (68.2 kg)  01/12/22 151 lb 12.8 oz (68.9 kg)  01/05/22 151 lb 3.2 oz (68.6 kg)   BP Readings from Last 3 Encounters:  02/04/22 (!) 151/84  01/12/22 (!) 150/72  01/05/22 138/65   Pulse Readings from Last 3 Encounters:  02/04/22 75  01/12/22 72  01/05/22 88    Renal function: CrCl cannot be calculated (Patient's most recent lab result is older than the maximum 21 days allowed.).  Past Medical History:  Diagnosis Date   COVID 10/25/2020   Dog bite of thigh 04/09/2012   Elevated lipids    GERD (gastroesophageal reflux disease)    Hypothyroidism    post thyroidectomy   Osteoarthritis    Osteopenia    t -2.5 hip in 2005 by Dexa   Osteoporosis    commpr fx after dog related fall  injury   Seasonal rhinitis    spring and fall   Skin cancer    squamous cell ca chest   Vaginal prolapse    hs corrected    Current Outpatient Medications on File Prior to Visit  Medication Sig Dispense Refill   amLODipine (NORVASC) 5 MG tablet Take 1 tablet (5 mg total) by mouth daily. 90 tablet 0   aspirin 325 MG EC tablet Take 325 mg by mouth every 6 (six) hours as needed for pain.     b complex vitamins tablet Take 1 tablet by mouth daily.     CALCIUM PO Take 1,200 mg by mouth daily.      denosumab (PROLIA) 60 MG/ML SOLN injection Inject 60 mg into the skin every 6 (six) months. Administer in upper arm, thigh, or abdomen     levothyroxine (SYNTHROID) 100 MCG tablet TAKE 1 TABLET(100 MCG) BY MOUTH DAILY 90  tablet 2   pantoprazole (PROTONIX) 20 MG tablet TAKE 1 TABLET(20 MG) BY MOUTH DAILY 90 tablet 1   No current facility-administered medications on file prior to visit.    Allergies  Allergen Reactions   Milk-Related Compounds Other (See Comments)   Onion Other (See Comments)   Other Other (See Comments)    Green Peppers     Assessment/Plan:  1. Hypertension -  Patient BP in room today 144/72 which is above goal however home readings remain normal. Home cuff appears accurate. No medication changes needed at this point. Will continue amlodipine '5mg'$  daily and recheck in 3 months.  Continue amlodipine '5mg'$  daily Continue monitoring BP at home Recheck in 3 months  Karren Cobble, PharmD, [place], [place], [place], [place], [place], [address] Phone: [phone], Fax: [phone]

## 2022-02-16 DIAGNOSIS — D1801 Hemangioma of skin and subcutaneous tissue: Secondary | ICD-10-CM | POA: Diagnosis not present

## 2022-02-16 DIAGNOSIS — L821 Other seborrheic keratosis: Secondary | ICD-10-CM | POA: Diagnosis not present

## 2022-02-16 DIAGNOSIS — D485 Neoplasm of uncertain behavior of skin: Secondary | ICD-10-CM | POA: Diagnosis not present

## 2022-02-16 DIAGNOSIS — L579 Skin changes due to chronic exposure to nonionizing radiation, unspecified: Secondary | ICD-10-CM | POA: Diagnosis not present

## 2022-02-16 DIAGNOSIS — L820 Inflamed seborrheic keratosis: Secondary | ICD-10-CM | POA: Diagnosis not present

## 2022-02-16 DIAGNOSIS — D692 Other nonthrombocytopenic purpura: Secondary | ICD-10-CM | POA: Diagnosis not present

## 2022-02-16 DIAGNOSIS — Z85828 Personal history of other malignant neoplasm of skin: Secondary | ICD-10-CM | POA: Diagnosis not present

## 2022-02-16 DIAGNOSIS — L905 Scar conditions and fibrosis of skin: Secondary | ICD-10-CM | POA: Diagnosis not present

## 2022-03-07 ENCOUNTER — Other Ambulatory Visit: Payer: Self-pay | Admitting: Internal Medicine

## 2022-03-08 ENCOUNTER — Telehealth: Payer: Self-pay | Admitting: *Deleted

## 2022-03-09 ENCOUNTER — Encounter: Payer: Self-pay | Admitting: Obstetrics & Gynecology

## 2022-03-09 ENCOUNTER — Telehealth: Payer: Self-pay | Admitting: *Deleted

## 2022-03-09 ENCOUNTER — Ambulatory Visit (INDEPENDENT_AMBULATORY_CARE_PROVIDER_SITE_OTHER): Payer: Medicare Other | Admitting: Obstetrics & Gynecology

## 2022-03-09 VITALS — BP 124/80 | HR 88 | Ht 59.0 in | Wt 147.0 lb

## 2022-03-09 DIAGNOSIS — Z01419 Encounter for gynecological examination (general) (routine) without abnormal findings: Secondary | ICD-10-CM

## 2022-03-09 DIAGNOSIS — Z90711 Acquired absence of uterus with remaining cervical stump: Secondary | ICD-10-CM

## 2022-03-09 DIAGNOSIS — Z780 Asymptomatic menopausal state: Secondary | ICD-10-CM

## 2022-03-09 DIAGNOSIS — M810 Age-related osteoporosis without current pathological fracture: Secondary | ICD-10-CM

## 2022-03-09 NOTE — Progress Notes (Signed)
[name] [name] 1940/10/13 [id number]   History:    81 y.o. G1P1L1 Widowed, husband passed away 2 yrs ago.   RP:  Established patient presenting for annual gyn exam    HPI: H/O Supracervical Hysterectomy/BSO with Sacrocolpopexy and Sling Procedure in 07/2007.  Menopause, well on no HRT.  No pelvic pain.  Abstinent.  Pap Neg 12/2016, no indication for a Pap at this time. No stress urinary incontinence. Bowel movements normal.  Breasts normal. Mammo Neg 07/2021. BMI 26.69.  Very active, walking/doing stairs every day.  On Prolia for osteoporosis.  BD 04/2020, schedule BD at [facility] in 05/2022.  Health labs with family physician.  Cologuard Neg 2021.   Past medical history,surgical history, family history and social history were all reviewed and documented in the EPIC chart.  Gynecologic History No LMP recorded. Patient has had a hysterectomy. Obstetric History OB History  Gravida Para Term Preterm AB Living  '1 1 1     1  '$ SAB IAB Ectopic Multiple Live Births               # Outcome Date GA Lbr Len/2nd Weight Sex Delivery Anes PTL Lv  1 Term              ROS: A ROS was performed and pertinent positives and negatives are included in the history. GENERAL: No fevers or chills. HEENT: No change in vision, no earache, sore throat or sinus congestion. NECK: No pain or stiffness. CARDIOVASCULAR: No chest pain or pressure. No palpitations. PULMONARY: No shortness of breath, cough or wheeze. GASTROINTESTINAL: No abdominal pain, nausea, vomiting or diarrhea, melena or bright red blood per rectum. GENITOURINARY: No urinary frequency, urgency, hesitancy or dysuria. MUSCULOSKELETAL: No joint or muscle pain, no back pain, no recent trauma. DERMATOLOGIC: No rash, no itching, no lesions. ENDOCRINE: No polyuria, polydipsia, no heat or cold intolerance. No recent change in weight. HEMATOLOGICAL: No anemia or easy bruising or bleeding. NEUROLOGIC: No headache, seizures, numbness, tingling or weakness. PSYCHIATRIC:  No depression, no loss of interest in normal activity or change in sleep pattern.     Exam:   BP 124/80   Pulse 88   Ht '4\' 11"'$  (1.499 m)   Wt 147 lb (66.7 kg)   SpO2 95%   BMI 29.69 kg/m   Body mass index is 29.69 kg/m.  General appearance : Well developed well nourished female. No acute distress HEENT: Eyes: no retinal hemorrhage or exudates,  Neck supple, trachea midline, no carotid bruits, no thyroidmegaly Lungs: Clear to auscultation, no rhonchi or wheezes, or rib retractions  Heart: Regular rate and rhythm, no murmurs or gallops Breast:Examined in sitting and supine position were symmetrical in appearance, no palpable masses or tenderness,  no skin retraction, no nipple inversion, no nipple discharge, no skin discoloration, no axillary or supraclavicular lymphadenopathy Abdomen: no palpable masses or tenderness, no rebound or guarding Extremities: no edema or skin discoloration or tenderness  Pelvic: Vulva: Normal             Vagina: No gross lesions or discharge  Cervix: No gross lesions or discharge  Uterus  S/P supracervical hysterectomy  Adnexa  Without masses or tenderness  Anus: Normal   Assessment/Plan:  81 y.o. female for annual exam   1. Well female exam with routine gynecological exam H/O Supracervical Hysterectomy/BSO with Sacrocolpopexy and Sling Procedure in 07/2007.  Menopause, well on no HRT.  No pelvic pain.  Abstinent.  Pap Neg 12/2016, no indication for a Pap at  this time. No stress urinary incontinence. Bowel movements normal.  Breasts normal. Mammo Neg 07/2021. BMI 26.69.  Very active, walking/doing stairs every day.  On Prolia for osteoporosis.  BD 04/2020, schedule BD at [facility] in 05/2022.  Health labs with family physician.  Cologuard Neg 2021.  2. Status post laparoscopic supracervical hysterectomy/BSO/Sacrocolpopexy  3. Postmenopause H/O Supracervical Hysterectomy/BSO with Sacrocolpopexy and Sling Procedure in 07/2007.  Menopause, well on no HRT.  No  pelvic pain.  Abstinent.  4. Age-related osteoporosis without current pathological fracture  BMI 26.69.  Very active, walking/doing stairs every day.  On Prolia for osteoporosis.  BD 04/2020, schedule BD at [facility] in 05/2022.    Princess Bruins MD, 10:29 AM 03/09/2022

## 2022-03-09 NOTE — Telephone Encounter (Signed)
Order faxed to [facility], confirmation received. Patient aware.

## 2022-03-09 NOTE — Telephone Encounter (Signed)
-----   Message from [name] Bruins, MD sent at 03/09/2022 10:30 AM EDT ----- Regarding: Please send orders to [facility] for a Bone Density in 05/2022 H/O Osteoporosis on Prolia.

## 2022-03-14 DIAGNOSIS — M47816 Spondylosis without myelopathy or radiculopathy, lumbar region: Secondary | ICD-10-CM | POA: Diagnosis not present

## 2022-03-14 DIAGNOSIS — M4316 Spondylolisthesis, lumbar region: Secondary | ICD-10-CM | POA: Diagnosis not present

## 2022-03-15 DIAGNOSIS — R0982 Postnasal drip: Secondary | ICD-10-CM | POA: Diagnosis not present

## 2022-03-15 DIAGNOSIS — K219 Gastro-esophageal reflux disease without esophagitis: Secondary | ICD-10-CM | POA: Diagnosis not present

## 2022-03-23 ENCOUNTER — Telehealth: Payer: Self-pay | Admitting: Internal Medicine

## 2022-03-23 ENCOUNTER — Ambulatory Visit (INDEPENDENT_AMBULATORY_CARE_PROVIDER_SITE_OTHER): Payer: Medicare Other | Admitting: *Deleted

## 2022-03-23 DIAGNOSIS — Z23 Encounter for immunization: Secondary | ICD-10-CM

## 2022-03-23 NOTE — Telephone Encounter (Signed)
Patient stopped by with note regarding referral to [facility]. She spoke with [facility] and they did not have a referral in for her. [facility] believes that it was sent to the wrong fax number. Patient and [facility] would like it refaxed to a alternative fax number.   Fax number is [phone]   Phone number is [phone]   Please advise

## 2022-03-25 ENCOUNTER — Encounter: Payer: Self-pay | Admitting: Emergency Medicine

## 2022-03-25 ENCOUNTER — Ambulatory Visit
Admission: EM | Admit: 2022-03-25 | Discharge: 2022-03-25 | Disposition: A | Discharge status: Home / Self Care | Payer: Medicare Other | Attending: Physician Assistant | Admitting: Physician Assistant

## 2022-03-25 DIAGNOSIS — J309 Allergic rhinitis, unspecified: Secondary | ICD-10-CM

## 2022-03-25 DIAGNOSIS — R0981 Nasal congestion: Secondary | ICD-10-CM

## 2022-03-25 MED ORDER — PREDNISONE 20 MG PO TABS: ORAL_TABLET | ORAL | 0 refills | Status: DC

## 2022-03-25 NOTE — Discharge Instructions (Signed)
Continue claritin. °

## 2022-03-25 NOTE — ED Triage Notes (Signed)
Pt c/o post nasal drip for the last 5 weeks. States she saw ENT about 1 week ago and was told to use nasal spray that has not helped.

## 2022-03-25 NOTE — ED Provider Notes (Signed)
[name]    CSN: [id number] Arrival date & time: 03/25/22  1841      History   Chief Complaint Chief Complaint  Patient presents with   Nasal Congestion    HPI [name] [name] is a 81 y.o. female.   Pt complains of sinus drainage and congestion for over a month.  Pt reports she has increased phelgm in her throat every am.  Pt reports she saw ENt who put her on a nasal spray a week ago.  Pt reports no relief.  Pt has tried claritin with some relief.  Pt denies fever, no shortness of breath.    The history is provided by the patient. No language interpreter was used.    Past Medical History:  Diagnosis Date   COVID 10/25/2020   Dog bite of thigh 04/09/2012   Elevated lipids    GERD (gastroesophageal reflux disease)    Hypothyroidism    post thyroidectomy   Osteoarthritis    Osteopenia    t -2.5 hip in 2005 by Dexa   Osteoporosis    commpr fx after dog related fall  injury   Seasonal rhinitis    spring and fall   Skin cancer    squamous cell ca chest   Vaginal prolapse    hs corrected    Patient Active Problem List   Diagnosis Date Noted   Medication management 10/16/2013   Medicare annual wellness visit, subsequent 07/16/2012   Skin cancer    Heart palpitations 05-16-2011   Family history of sudden death 2011/05/16   PALPITATIONS 03/31/2010   BACK PAIN, LUMBAR 10/13/2009   HEARTBURN 10/13/2009   FOOT PAIN 03/31/2008   Hypothyroidism 01/25/2007   Osteoporosis 01/25/2007   OSTEOPENIA 01/25/2007    Past Surgical History:  Procedure Laterality Date   ABDOMINAL HYSTERECTOMY  07/2007   bladders sling surgery  07/2007   COLONOSCOPY  2006   THYROIDECTOMY      OB History     Gravida  1   Para  1   Term  1   Preterm      AB      Living  1      SAB      IAB      Ectopic      Multiple      Live Births               Home Medications    Prior to Admission medications   Medication Sig Start Date End Date Taking?  Authorizing Provider  predniSONE (DELTASONE) 20 MG tablet Two tablet a day for 6 days 03/25/22  Yes Fransico Meadow, PA-C  amLODipine (NORVASC) 5 MG tablet Take 1 tablet (5 mg total) by mouth daily. 01/12/22   Janina Mayo, MD  aspirin 325 MG EC tablet Take 325 mg by mouth every 6 (six) hours as needed for pain.    [provider]  b complex vitamins tablet Take 1 tablet by mouth daily.    [provider]  CALCIUM PO Take 1,200 mg by mouth daily.     [provider]  denosumab (PROLIA) 60 MG/ML SOLN injection Inject 60 mg into the skin every 6 (six) months. Administer in upper arm, thigh, or abdomen    [provider]  levothyroxine (SYNTHROID) 100 MCG tablet TAKE 1 TABLET(100 MCG) BY MOUTH DAILY 11/01/21   Panosh, Standley Brooking, MD  pantoprazole (PROTONIX) 20 MG tablet TAKE 1 TABLET(20 MG) BY MOUTH DAILY 03/10/22  Panosh, Standley Brooking, MD    Family History Family History  Problem Relation Age of Onset   Heart attack Mother    Alcohol abuse Mother    Kidney cancer Father    Hypertension Maternal Grandmother    Esophageal cancer Neg Hx    Colon cancer Neg Hx    Pancreatic cancer Neg Hx    Stomach cancer Neg Hx     Social History Social History   Tobacco Use   Smoking status: Never   Smokeless tobacco: Never  Vaping Use   Vaping Use: Never used  Substance Use Topics   Alcohol use: Yes    Comment: occasionally   Drug use: No     Allergies   Capsicum annuum extract & derivative (bell pepper) [capsicum], Milk-related compounds, Onion, and Other   Review of Systems Review of Systems  HENT:  Positive for postnasal drip and rhinorrhea.   All other systems reviewed and are negative.    Physical Exam Triage Vital Signs ED Triage Vitals [03/25/22 1850]  Enc Vitals Group     BP 129/75     Pulse Rate 86     Resp 18     Temp 98 F (36.7 C)     Temp Source Oral     SpO2 96 %     Weight      Height      Head Circumference      Peak Flow       Pain Score 0     Pain Loc      Pain Edu?      Excl. in [place]?    No data found.  Updated Vital Signs BP 129/75 (BP Location: Right Arm)   Pulse 86   Temp 98 F (36.7 C) (Oral)   Resp 18   SpO2 96%   Visual Acuity Right Eye Distance:   Left Eye Distance:   Bilateral Distance:    Right Eye Near:   Left Eye Near:    Bilateral Near:     Physical Exam Vitals and nursing note reviewed.  Constitutional:      Appearance: She is well-developed.  HENT:     Head: Normocephalic.     Nose: Congestion and rhinorrhea present.     Mouth/Throat:     Pharynx: Posterior oropharyngeal erythema present.  Eyes:     Extraocular Movements: Extraocular movements intact.     Pupils: Pupils are equal, round, and reactive to light.  Cardiovascular:     Rate and Rhythm: Normal rate.  Pulmonary:     Effort: Pulmonary effort is normal.  Abdominal:     General: There is no distension.  Musculoskeletal:        General: Normal range of motion.     Cervical back: Normal range of motion.  Skin:    General: Skin is warm.  Neurological:     General: No focal deficit present.     Mental Status: She is alert and oriented to person, place, and time.      UC Treatments / Results  Labs (all labs ordered are listed, but only abnormal results are displayed) Labs Reviewed - No data to display  EKG   Radiology No results found.  Procedures Procedures (including critical care time)  Medications Ordered in UC Medications - No data to display  Initial Impression / Assessment and Plan / UC Course  I have reviewed the triage vital signs and the nursing notes.  Pertinent labs & imaging results that  were available during my care of the patient were reviewed by me and considered in my medical decision making (see chart for details).     MDM: Pt's symptoms sound allergic.  I will try her on a short dose of prednisone.  Pt advised to continue claritin.  Pt advised to follow up with her MD.   Final  Clinical Impressions(s) / UC Diagnoses   Final diagnoses:  Allergic rhinitis, unspecified seasonality, unspecified trigger  Nasal congestion     Discharge Instructions      Continue claritin     ED Prescriptions     Medication Sig Dispense Auth. Provider   predniSONE (DELTASONE) 20 MG tablet Two tablet a day for 6 days 12 tablet Fransico Meadow, [place]      PDMP not reviewed this encounter. An After Visit Summary was printed and given to the patient.    Fransico Meadow, [place] 03/25/22 1921

## 2022-03-29 ENCOUNTER — Other Ambulatory Visit: Payer: Self-pay | Admitting: Internal Medicine

## 2022-03-29 DIAGNOSIS — Z79899 Other long term (current) drug therapy: Secondary | ICD-10-CM

## 2022-03-29 DIAGNOSIS — I10 Essential (primary) hypertension: Secondary | ICD-10-CM

## 2022-04-04 NOTE — Telephone Encounter (Signed)
Pt is calling checking on the referral to [facility] . Please advise

## 2022-04-06 NOTE — Telephone Encounter (Signed)
Per Paula Libra, information has been faxed. Office states they have not received it, Paula Libra has faxed it twice today.

## 2022-04-14 NOTE — Telephone Encounter (Signed)
Pt has been scheduled.  °

## 2022-04-17 ENCOUNTER — Ambulatory Visit
Admission: EM | Admit: 2022-04-17 | Discharge: 2022-04-17 | Disposition: A | Discharge status: Home / Self Care | Payer: Medicare Other | Attending: Family Medicine | Admitting: Family Medicine

## 2022-04-17 ENCOUNTER — Other Ambulatory Visit: Payer: Self-pay

## 2022-04-17 ENCOUNTER — Encounter: Payer: Self-pay | Admitting: Emergency Medicine

## 2022-04-17 DIAGNOSIS — J3489 Other specified disorders of nose and nasal sinuses: Secondary | ICD-10-CM

## 2022-04-17 DIAGNOSIS — J0100 Acute maxillary sinusitis, unspecified: Secondary | ICD-10-CM | POA: Diagnosis not present

## 2022-04-17 DIAGNOSIS — J309 Allergic rhinitis, unspecified: Secondary | ICD-10-CM | POA: Diagnosis not present

## 2022-04-17 MED ORDER — PREDNISONE 10 MG (21) PO TBPK: ORAL_TABLET | Freq: Every day | ORAL | 0 refills | Status: DC

## 2022-04-17 MED ORDER — AMOXICILLIN-POT CLAVULANATE 875-125 MG PO TABS: 1.0000 | ORAL_TABLET | Freq: Two times a day (BID) | ORAL | 0 refills | Status: AC

## 2022-04-17 MED ORDER — FEXOFENADINE HCL 180 MG PO TABS: 180.0000 mg | ORAL_TABLET | Freq: Every day | ORAL | 0 refills | Status: DC

## 2022-04-17 NOTE — ED Triage Notes (Signed)
Patient presents to Urgent Care with complaints of postnasal drainage causing a cough since 4-5 weeks. Patient reports watery eyes, runny nose. Taking Claritin and nasal spray with minimum relief.

## 2022-04-17 NOTE — Discharge Instructions (Addendum)
Advised patient to take medication as directed with food to completion.  Instructed patient to take prednisone and Allegra with first dose of Augmentin for the next 10 days.  Advised may continue Allegra after 5 days and use as needed for concurrent postnasal drainage/drip/runny nose.  Encouraged patient to increase daily water intake to 64 ounces per day while taking these medications.  Advised if symptoms worsen and/or unresolved please follow-up with PCP or here for further evaluation.

## 2022-04-17 NOTE — ED Provider Notes (Signed)
[name]    CSN: [id number] Arrival date & time: 04/17/22  1352      History   Chief Complaint Chief Complaint  Patient presents with   postnasal drainage   Cough   Nasal Congestion    HPI [name] [name] is a 81 y.o. female.   HPI Very pleasant 81 year old female presents with cough and runny nose for 4 to 5 weeks.  Patient had similar symptoms on previous urgent care evaluation here on 03/25/2022, was treated with prednisone and Claritin.  PMH significant for hypothyroidism, osteoporosis, and skin cancer.  Past Medical History:  Diagnosis Date   COVID 10/25/2020   Dog bite of thigh 04/09/2012   Elevated lipids    GERD (gastroesophageal reflux disease)    Hypothyroidism    post thyroidectomy   Osteoarthritis    Osteopenia    t -2.5 hip in 2005 by Dexa   Osteoporosis    commpr fx after dog related fall  injury   Seasonal rhinitis    spring and fall   Skin cancer    squamous cell ca chest   Vaginal prolapse    hs corrected    Patient Active Problem List   Diagnosis Date Noted   Medication management 10/16/2013   Medicare annual wellness visit, subsequent 07/16/2012   Skin cancer    Heart palpitations May 05, 2011   Family history of sudden death 05/05/2011   PALPITATIONS 03/31/2010   BACK PAIN, LUMBAR 10/13/2009   HEARTBURN 10/13/2009   FOOT PAIN 03/31/2008   Hypothyroidism 01/25/2007   Osteoporosis 01/25/2007   OSTEOPENIA 01/25/2007    Past Surgical History:  Procedure Laterality Date   ABDOMINAL HYSTERECTOMY  07/2007   bladders sling surgery  07/2007   COLONOSCOPY  2006   THYROIDECTOMY      OB History     Gravida  1   Para  1   Term  1   Preterm      AB      Living  1      SAB      IAB      Ectopic      Multiple      Live Births               Home Medications    Prior to Admission medications   Medication Sig Start Date End Date Taking? Authorizing Provider  amLODipine (NORVASC) 5 MG tablet TAKE 1  TABLET(5 MG) BY MOUTH DAILY 03/29/22  Yes Janina Mayo, MD  amoxicillin-clavulanate (AUGMENTIN) 875-125 MG tablet Take 1 tablet by mouth every 12 (twelve) hours for 10 days. 04/17/22 04/27/22 Yes Eliezer Lofts, FNP  aspirin 325 MG EC tablet Take 325 mg by mouth every 6 (six) hours as needed for pain.   Yes [provider]  b complex vitamins tablet Take 1 tablet by mouth daily.   Yes [provider]  CALCIUM PO Take 1,200 mg by mouth daily.    Yes [provider]  denosumab (PROLIA) 60 MG/ML SOLN injection Inject 60 mg into the skin every 6 (six) months. Administer in upper arm, thigh, or abdomen   Yes [provider]  fexofenadine (ALLEGRA ALLERGY) 180 MG tablet Take 1 tablet (180 mg total) by mouth daily for 15 days. 04/17/22 05/02/22 Yes Eliezer Lofts, FNP  levothyroxine (SYNTHROID) 100 MCG tablet TAKE 1 TABLET(100 MCG) BY MOUTH DAILY 11/01/21  Yes Panosh, Standley Brooking, MD  pantoprazole (PROTONIX) 20 MG tablet TAKE 1 TABLET(20 MG) BY MOUTH DAILY  03/10/22  Yes Panosh, Standley Brooking, MD  predniSONE (STERAPRED UNI-PAK 21 TAB) 10 MG (21) TBPK tablet Take by mouth daily. Take 6 tabs by mouth daily  for 2 days, then 5 tabs for 2 days, then 4 tabs for 2 days, then 3 tabs for 2 days, 2 tabs for 2 days, then 1 tab by mouth daily for 2 days 04/17/22  Yes Eliezer Lofts, FNP    Family History Family History  Problem Relation Age of Onset   Heart attack Mother    Alcohol abuse Mother    Kidney cancer Father    Hypertension Maternal Grandmother    Esophageal cancer Neg Hx    Colon cancer Neg Hx    Pancreatic cancer Neg Hx    Stomach cancer Neg Hx     Social History Social History   Tobacco Use   Smoking status: Never   Smokeless tobacco: Never  Vaping Use   Vaping Use: Never used  Substance Use Topics   Alcohol use: Yes    Comment: occasionally   Drug use: No     Allergies   Capsicum annuum extract & derivative (bell pepper) [capsicum], Milk-related compounds,  Onion, and Other   Review of Systems Review of Systems  HENT:  Positive for congestion and rhinorrhea.   Respiratory:  Positive for cough.   All other systems reviewed and are negative.    Physical Exam Triage Vital Signs ED Triage Vitals  Enc Vitals Group     BP      Pulse      Resp      Temp      Temp src      SpO2      Weight      Height      Head Circumference      Peak Flow      Pain Score      Pain Loc      Pain Edu?      Excl. in [place]?    No data found.  Updated Vital Signs BP 133/80 (BP Location: Left Arm)   Pulse 85   Temp 98.5 F (36.9 C) (Oral)   Resp 16   SpO2 94%   Physical Exam Vitals and nursing note reviewed.  Constitutional:      General: She is not in acute distress.    Appearance: Normal appearance. She is normal weight. She is not ill-appearing.  HENT:     Head: Normocephalic and atraumatic.     Right Ear: Tympanic membrane and external ear normal.     Left Ear: Tympanic membrane and external ear normal.     Ears:     Comments: Significant eustachian tube dysfunction noted bilaterally    Nose:     Right Sinus: Maxillary sinus tenderness present.     Left Sinus: Maxillary sinus tenderness present.     Comments: Turbinates are erythematous/edematous    Mouth/Throat:     Mouth: Mucous membranes are moist.     Pharynx: Oropharynx is clear.     Comments: Significant amount of clear drainage of posterior oropharynx noted Eyes:     Extraocular Movements: Extraocular movements intact.     Conjunctiva/sclera: Conjunctivae normal.     Pupils: Pupils are equal, round, and reactive to light.  Cardiovascular:     Rate and Rhythm: Normal rate and regular rhythm.     Pulses: Normal pulses.     Heart sounds: Normal heart sounds. No murmur heard. Pulmonary:  Effort: Pulmonary effort is normal.     Breath sounds: Normal breath sounds. No wheezing, rhonchi or rales.  Musculoskeletal:        General: Normal range of motion.     Cervical back:  Normal range of motion and neck supple. No tenderness.  Lymphadenopathy:     Cervical: No cervical adenopathy.  Skin:    General: Skin is warm and dry.  Neurological:     General: No focal deficit present.     Mental Status: She is alert and oriented to person, place, and time.      UC Treatments / Results  Labs (all labs ordered are listed, but only abnormal results are displayed) Labs Reviewed - No data to display  EKG   Radiology No results found.  Procedures Procedures (including critical care time)  Medications Ordered in UC Medications - No data to display  Initial Impression / Assessment and Plan / UC Course  I have reviewed the triage vital signs and the nursing notes.  Pertinent labs & imaging results that were available during my care of the patient were reviewed by me and considered in my medical decision making (see chart for details).     MDM: 1.  Acute maxillary sinusitis, recurrence not specified-Rx'd Augmentin; 2.  Sinus pressure-Rx Sterapred Unipak; 3.  Allergic rhinitis-Rx'd Allegra. Advised patient to take medication as directed with food to completion.  Instructed patient to take prednisone and Allegra with first dose of Augmentin for the next 10 days.  Advised may continue Allegra after 5 days and use as needed for concurrent postnasal drainage/drip/runny nose.  Encouraged patient to increase daily water intake to 64 ounces per day while taking these medications.  Advised if symptoms worsen and/or unresolved please follow-up with PCP or here for further evaluation.  Patient discharged home, hemodynamically stable. Final Clinical Impressions(s) / UC Diagnoses   Final diagnoses:  Acute maxillary sinusitis, recurrence not specified  Allergic rhinitis, unspecified seasonality, unspecified trigger  Sinus pressure     Discharge Instructions      Advised patient to take medication as directed with food to completion.  Instructed patient to take  prednisone and Allegra with first dose of Augmentin for the next 10 days.  Advised may continue Allegra after 5 days and use as needed for concurrent postnasal drainage/drip/runny nose.  Encouraged patient to increase daily water intake to 64 ounces per day while taking these medications.  Advised if symptoms worsen and/or unresolved please follow-up with PCP or here for further evaluation.     ED Prescriptions     Medication Sig Dispense Auth. Provider   amoxicillin-clavulanate (AUGMENTIN) 875-125 MG tablet Take 1 tablet by mouth every 12 (twelve) hours for 10 days. 20 tablet Eliezer Lofts, FNP   predniSONE (STERAPRED UNI-PAK 21 TAB) 10 MG (21) TBPK tablet Take by mouth daily. Take 6 tabs by mouth daily  for 2 days, then 5 tabs for 2 days, then 4 tabs for 2 days, then 3 tabs for 2 days, 2 tabs for 2 days, then 1 tab by mouth daily for 2 days 42 tablet Eliezer Lofts, FNP   fexofenadine [facility] ALLERGY) 180 MG tablet Take 1 tablet (180 mg total) by mouth daily for 15 days. 15 tablet Eliezer Lofts, FNP      PDMP not reviewed this encounter.   Eliezer Lofts, [place] 04/17/22 1513

## 2022-04-18 ENCOUNTER — Telehealth: Payer: Self-pay | Admitting: Emergency Medicine

## 2022-04-18 NOTE — Telephone Encounter (Signed)
Spoke with patient states that she has taken one dose of the medication and seems to be doing ok.  If she has any problems or concerns, she will follow up as needed.

## 2022-04-22 DIAGNOSIS — R0602 Shortness of breath: Secondary | ICD-10-CM | POA: Diagnosis not present

## 2022-04-22 DIAGNOSIS — J309 Allergic rhinitis, unspecified: Secondary | ICD-10-CM | POA: Diagnosis not present

## 2022-04-22 DIAGNOSIS — H1010 Acute atopic conjunctivitis, unspecified eye: Secondary | ICD-10-CM | POA: Diagnosis not present

## 2022-04-28 DIAGNOSIS — R0602 Shortness of breath: Secondary | ICD-10-CM | POA: Diagnosis not present

## 2022-04-28 DIAGNOSIS — J310 Chronic rhinitis: Secondary | ICD-10-CM | POA: Diagnosis not present

## 2022-05-02 DIAGNOSIS — R0602 Shortness of breath: Secondary | ICD-10-CM | POA: Diagnosis not present

## 2022-05-02 DIAGNOSIS — M40209 Unspecified kyphosis, site unspecified: Secondary | ICD-10-CM | POA: Diagnosis not present

## 2022-05-10 DIAGNOSIS — H6121 Impacted cerumen, right ear: Secondary | ICD-10-CM | POA: Diagnosis not present

## 2022-05-10 DIAGNOSIS — R0602 Shortness of breath: Secondary | ICD-10-CM | POA: Diagnosis not present

## 2022-05-10 DIAGNOSIS — R490 Dysphonia: Secondary | ICD-10-CM | POA: Diagnosis not present

## 2022-05-10 DIAGNOSIS — J310 Chronic rhinitis: Secondary | ICD-10-CM | POA: Diagnosis not present

## 2022-05-13 ENCOUNTER — Ambulatory Visit: Payer: Medicare Other | Attending: Cardiology | Admitting: Pharmacist

## 2022-05-13 ENCOUNTER — Encounter: Payer: Self-pay | Admitting: Pharmacist

## 2022-05-13 VITALS — BP 132/71 | HR 73

## 2022-05-13 DIAGNOSIS — I10 Essential (primary) hypertension: Secondary | ICD-10-CM | POA: Insufficient documentation

## 2022-05-13 DIAGNOSIS — Z79899 Other long term (current) drug therapy: Secondary | ICD-10-CM | POA: Insufficient documentation

## 2022-05-13 MED ORDER — AMLODIPINE BESYLATE 5 MG PO TABS: 5.0000 mg | ORAL_TABLET | Freq: Every day | ORAL | 3 refills | Status: DC

## 2022-05-13 NOTE — Patient Instructions (Signed)
It was good seeing you again!  You blood pressure is improved. Your goal remains less than 130/80  Please continue your amlodipine '5mg'$  once a day  Let me know if you have any questions  Karren Cobble, PharmD, [place], [address], [place], [place], [place], [address] Phone: [phone], Fax: [phone]

## 2022-05-13 NOTE — Progress Notes (Unsigned)
Patient ID: [name] [name]                 DOB: 03-15-1941                      MRN: [id number]      HPI: [name] [name] is a 81 y.o. female referred by Dr. Harl Bowie to HTN clinic. PMH is significant for HTN and SOB.  This is fourth visit to HTN clinic and BP has been steadily decreasing.  Patient presents today in good spirits. Remains busy. Today she is planning for a party with her soroity and then working in [name].  Having allergy issues and urgent care started azelastine, abuterol, and fexofenadine.  Allergies had been gradually getting worse and was seen at [facility] who performed pulmonary and cardiology workup.  Echo showed EF of 55%.  Continues to tolerate amlodipine. Now takes in morning. Brough home readings. Readings typically 110s-120s/70s except when she was on prednisone and readings increased to 130s/80s.  Current HTN meds:  Amlodipine '5mg'$  daily  Wt Readings from Last 3 Encounters:  03/09/22 147 lb (66.7 kg)  02/04/22 150 lb 6.4 oz (68.2 kg)  01/12/22 151 lb 12.8 oz (68.9 kg)   BP Readings from Last 3 Encounters:  04/17/22 133/80  03/25/22 129/75  03/09/22 124/80   Pulse Readings from Last 3 Encounters:  04/17/22 85  03/25/22 86  03/09/22 88    Renal function: CrCl cannot be calculated (Patient's most recent lab result is older than the maximum 21 days allowed.).  Past Medical History:  Diagnosis Date   COVID 10/25/2020   Dog bite of thigh 04/09/2012   Elevated lipids    GERD (gastroesophageal reflux disease)    Hypothyroidism    post thyroidectomy   Osteoarthritis    Osteopenia    t -2.5 hip in 2005 by Dexa   Osteoporosis    commpr fx after dog related fall  injury   Seasonal rhinitis    spring and fall   Skin cancer    squamous cell ca chest   Vaginal prolapse    hs corrected    Current Outpatient Medications on File Prior to Visit  Medication Sig Dispense Refill   amLODipine (NORVASC) 5 MG tablet TAKE 1 TABLET(5 MG) BY MOUTH DAILY 90 tablet 0    aspirin 325 MG EC tablet Take 325 mg by mouth every 6 (six) hours as needed for pain.     b complex vitamins tablet Take 1 tablet by mouth daily.     CALCIUM PO Take 1,200 mg by mouth daily.      denosumab (PROLIA) 60 MG/ML SOLN injection Inject 60 mg into the skin every 6 (six) months. Administer in upper arm, thigh, or abdomen     fexofenadine (ALLEGRA ALLERGY) 180 MG tablet Take 1 tablet (180 mg total) by mouth daily for 15 days. 15 tablet 0   levothyroxine (SYNTHROID) 100 MCG tablet TAKE 1 TABLET(100 MCG) BY MOUTH DAILY 90 tablet 2   pantoprazole (PROTONIX) 20 MG tablet TAKE 1 TABLET(20 MG) BY MOUTH DAILY 90 tablet 1   predniSONE (STERAPRED UNI-PAK 21 TAB) 10 MG (21) TBPK tablet Take by mouth daily. Take 6 tabs by mouth daily  for 2 days, then 5 tabs for 2 days, then 4 tabs for 2 days, then 3 tabs for 2 days, 2 tabs for 2 days, then 1 tab by mouth daily for 2 days 42 tablet 0   No current facility-administered medications on file  prior to visit.    Allergies  Allergen Reactions   Capsicum Annuum Extract & Derivative (Bell Pepper) [Capsicum]     Other reaction(s): Other (See Comments)   Milk-Related Compounds Other (See Comments)   Onion Other (See Comments)   Other Other (See Comments)    Green Peppers     Assessment/Plan:  1. Hypertension -  Patient BP in room today 132/71 which is much improved. Higher than home readings but home cuff has been validated.  Patient is pleased with results. No medication changes needed at this time. Will continue amlodipine '5mg'$  daily and recheck in 3 months.  Continue amlodipine '5mg'$  daily Continue monitoring BP at home Recheck in 3 months  Karren Cobble, PharmD, [place], [place], [place], [place], [place], [address] Phone: [phone], Fax: [phone]

## 2022-05-23 DIAGNOSIS — Z780 Asymptomatic menopausal state: Secondary | ICD-10-CM | POA: Diagnosis not present

## 2022-05-23 DIAGNOSIS — M85852 Other specified disorders of bone density and structure, left thigh: Secondary | ICD-10-CM | POA: Diagnosis not present

## 2022-05-23 DIAGNOSIS — M85851 Other specified disorders of bone density and structure, right thigh: Secondary | ICD-10-CM | POA: Diagnosis not present

## 2022-05-23 LAB — HM DEXA SCAN

## 2022-05-24 ENCOUNTER — Ambulatory Visit (INDEPENDENT_AMBULATORY_CARE_PROVIDER_SITE_OTHER): Payer: Medicare Other | Admitting: Internal Medicine

## 2022-05-24 ENCOUNTER — Encounter: Payer: Self-pay | Admitting: Internal Medicine

## 2022-05-24 VITALS — BP 136/78 | HR 95 | Temp 98.2°F | Wt 142.2 lb

## 2022-05-24 DIAGNOSIS — Z79899 Other long term (current) drug therapy: Secondary | ICD-10-CM | POA: Diagnosis not present

## 2022-05-24 DIAGNOSIS — R481 Agnosia: Secondary | ICD-10-CM | POA: Diagnosis not present

## 2022-05-24 DIAGNOSIS — R0600 Dyspnea, unspecified: Secondary | ICD-10-CM

## 2022-05-24 DIAGNOSIS — J310 Chronic rhinitis: Secondary | ICD-10-CM

## 2022-05-24 DIAGNOSIS — I10 Essential (primary) hypertension: Secondary | ICD-10-CM

## 2022-05-24 NOTE — Patient Instructions (Addendum)
Good to see you today     Vasomotor rhinitis   is the working diagnosis   for the nose drip congestion.  FU  with [facility] people.   For concerns.    Bp repeat is better continue to monitoring   to make sure is in range.

## 2022-05-24 NOTE — Progress Notes (Unsigned)
Chief Complaint  Patient presents with   loss of taste    Pt c/o loss of taste. Going on about 2 months. with postnasal drip. Went to see ENT with [facility] for allergy and states it had came back negative for allergy.     HPI: [name] [name] 81 y.o. come in for  nproblem   that has been evaluated but a number of departments at [facility]   for new DOE dec exercise tolerance without obvious cause  Has a drippy nose   had neg stress test Nuclear med  except ht response to exercise at cardiology At  [facility]   Given albuterol  once and no difference  . Neg allergy evaluation.  Then did breathing tests pulmonaryno obs  poss restrictive findings ,   echo    then ENT who felt she had   nasal. Causes  VMrhinits?  Given astepro  as a 6 week trial only used it a few days so far and no different   To fu  in [place]    Tried  left over allegra  last night  dry mouth ? No help  Wanted info on why having more sob [place]king [place] ?  Spine on prolia  no injury mild kyphosis  Slight slippage of l5  on spine  but no other findings of significance ROS: See pertinent positives and negatives per HPI. [name] of taste goes with all of above no acute dx  covid   Past Medical History:  Diagnosis Date   COVID 10/25/2020   Dog bite of thigh 04/09/2012   Elevated lipids    GERD (gastroesophageal reflux disease)    Hypothyroidism    post thyroidectomy   Osteoarthritis    Osteopenia    t -2.5 hip in 2005 by Dexa   Osteoporosis    commpr fx after dog related fall  injury   Seasonal rhinitis    spring and fall   Skin cancer    squamous cell ca chest   Vaginal prolapse    hs corrected    Family History  Problem Relation Age of Onset   Heart attack Mother    Alcohol abuse Mother    Kidney cancer Father    Hypertension Maternal Grandmother    Esophageal cancer Neg Hx    Colon cancer Neg Hx    Pancreatic cancer Neg Hx    Stomach cancer Neg Hx     Social History   Socioeconomic History   Marital status:  Widowed    Spouse name: Not on file   Number of children: Not on file   Years of education: Not on file   Highest education level: Associate degree: academic program  Occupational History   Not on file  Tobacco Use   Smoking status: Never   Smokeless tobacco: Never  Vaping Use   Vaping Use: Never used  Substance and Sexual Activity   Alcohol use: Yes    Comment: occasionally   Drug use: No   Sexual activity: Not Currently    Partners: Male    Birth control/protection: Surgical    Comment: hysterectomy, older than 16, less than 5  Other Topics Concern   Not on file  Social History Narrative   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   Regular Exercise- yes   Now on rx   Working  Full time        hh of 2 cats and dogs in hh    Social Determinants of Health  Financial Resource Strain: Low Risk  (05/24/2022)   Overall Financial Resource Strain (CARDIA)    Difficulty of Paying Living Expenses: Not hard at all  Food Insecurity: No Food Insecurity (05/24/2022)   Hunger Vital Sign    Worried About Running Out of Food in the Last Year: Never true    Ran Out of Food in the Last Year: Never true  Transportation Needs: No Transportation Needs (05/24/2022)   PRAPARE - Hydrologist (Medical): No    Lack of Transportation (Non-Medical): No  Physical Activity: Insufficiently Active (05/24/2022)   Exercise Vital Sign    Days of Exercise per Week: 5 days    Minutes of Exercise per Session: 20 min  Stress: No Stress Concern Present (05/24/2022)   [place]    Feeling of Stress : Not at all  Social Connections: Moderately Integrated (05/24/2022)   Social Connection and Isolation Panel [NHANES]    Frequency of Communication with Friends and Family: More than three times a week    Frequency of Social Gatherings with Friends and Family: Once a week    Attends Religious Services: More than 4 times  per year    Active Member of [employer] or Organizations: Yes    Attends Archivist Meetings: More than 4 times per year    Marital Status: Widowed    Outpatient Medications Prior to Visit  Medication Sig Dispense Refill   amLODipine (NORVASC) 5 MG tablet Take 1 tablet (5 mg total) by mouth daily. 90 tablet 3   aspirin 325 MG EC tablet Take 325 mg by mouth every 6 (six) hours as needed for pain.     azelastine (ASTELIN) 0.1 % nasal spray Place 1 spray into both nostrils 2 (two) times daily.     b complex vitamins tablet Take 1 tablet by mouth daily.     CALCIUM PO Take 1,200 mg by mouth daily.      denosumab (PROLIA) 60 MG/ML SOLN injection Inject 60 mg into the skin every 6 (six) months. Administer in upper arm, thigh, or abdomen     levothyroxine (SYNTHROID) 100 MCG tablet TAKE 1 TABLET(100 MCG) BY MOUTH DAILY 90 tablet 2   pantoprazole (PROTONIX) 20 MG tablet TAKE 1 TABLET(20 MG) BY MOUTH DAILY 90 tablet 1   Spacer/Aero-Holding Chambers (EASIVENT) inhaler See admin instructions.     albuterol (VENTOLIN HFA) 108 (90 Base) MCG/ACT inhaler Inhale into the lungs. (Patient not taking: Reported on 05/24/2022)     fexofenadine (ALLEGRA ALLERGY) 180 MG tablet Take 1 tablet (180 mg total) by mouth daily for 15 days. 15 tablet 0   No facility-administered medications prior to visit.     EXAM:  BP 136/78 (BP Location: Left Arm, Cuff Size: Normal)   Pulse 95   Temp 98.2 F (36.8 C) (Oral)   Wt 142 lb 3.2 oz (64.5 kg)   SpO2 93%   BMI 28.72 kg/m   Body mass index is 28.72 kg/m.  GENERAL: vitals reviewed and listed above, alert, oriented, appears well hydrated and in no acute distress HEENT: atraumatic, conjunctiva  clear, no obvious abnormalities on inspection of external nose and ears  NECK: no obvious masses on inspection palpation  LUNGS: clear to auscultation bilaterally, no wheezes, rales or rhonchi,mild kyphosis  CV: HRRR, no clubbing cyanosis or  peripheral edema nl cap  refill  MS: moves all extremities without noticeable focal  abnormality PSYCH: pleasant and cooperative, no  obvious depression or anxiety Lab Results  Component Value Date   WBC 8.4 07/08/2021   HGB 12.9 07/08/2021   HCT 40.0 07/08/2021   PLT 298.0 07/08/2021   GLUCOSE 95 07/08/2021   CHOL 199 08/30/2021   TRIG 120.0 08/30/2021   HDL 55.70 08/30/2021   LDLDIRECT 134.1 12/27/2006   LDLCALC 120 (H) 08/30/2021   ALT 18 07/08/2021   AST 30 07/08/2021   NA 143 07/08/2021   K 3.9 07/08/2021   CL 108 07/08/2021   CREATININE 0.77 07/08/2021   BUN 18 07/08/2021   CO2 29 07/08/2021   TSH 1.15 08/30/2021   INR 1.0 07/25/2007   HGBA1C 5.8 09/23/2020   BP Readings from Last 3 Encounters:  05/24/22 136/78  05/13/22 132/71  04/17/22 133/80   Reveiwed  multiple documents from [facility] evaluation  and read some to patient  ASSESSMENT AND PLAN:  Discussed the following assessment and plan:  Hypertensive response to exercise  Loss of perception for taste  Medication management  Chronic rhinitis  Dyspnea, unspecified type Evaluation at [facility] center  on going  no obv card cause  ? If combo of restrictive chest wall and rhinitis could be  cause  encouraged to do fu  No  compelling evidence of chronic PE other  Very active lady . BP is good enough  for now -Patient advised to return or notify health care team  if  new concerns arise. Record review counsel  advice 334 minutes reviewed pulm allergy card so far from [facility] care everywhere Patient Instructions  Good to see you today     Vasomotor rhinitis   is the working diagnosis   for the nose drip congestion.  FU  with [facility] people.   For concerns.    Bp repeat is better continue to monitoring   to make sure is in range.    Standley Brooking. Jelene Albano M.D.

## 2022-05-25 ENCOUNTER — Encounter: Payer: Self-pay | Admitting: Obstetrics & Gynecology

## 2022-05-25 ENCOUNTER — Encounter: Payer: Self-pay | Admitting: Internal Medicine

## 2022-05-25 DIAGNOSIS — M47816 Spondylosis without myelopathy or radiculopathy, lumbar region: Secondary | ICD-10-CM | POA: Diagnosis not present

## 2022-05-25 DIAGNOSIS — M4316 Spondylolisthesis, lumbar region: Secondary | ICD-10-CM | POA: Diagnosis not present

## 2022-06-06 DIAGNOSIS — M542 Cervicalgia: Secondary | ICD-10-CM | POA: Diagnosis not present

## 2022-06-07 ENCOUNTER — Telehealth: Payer: Self-pay | Admitting: Obstetrics & Gynecology

## 2022-06-07 NOTE — Telephone Encounter (Addendum)
Dual coverage   Annual exam 03/08/2022  Calcium 9.1            Date 07/08/2021  Upcoming dental procedures   Prior Authorization needed NO  Pt estimated Cost $0  Appt 06/22/2022     Coverage Details: $0% one dose, 0% admin fee

## 2022-06-10 DIAGNOSIS — M542 Cervicalgia: Secondary | ICD-10-CM | POA: Diagnosis not present

## 2022-06-13 DIAGNOSIS — M47812 Spondylosis without myelopathy or radiculopathy, cervical region: Secondary | ICD-10-CM | POA: Diagnosis not present

## 2022-06-13 DIAGNOSIS — M5481 Occipital neuralgia: Secondary | ICD-10-CM | POA: Diagnosis not present

## 2022-06-18 IMAGING — DX DG CHEST 2V
2 series · 2 of 2 positions shown · non-contrast
Comparison: 07/25/2007

CLINICAL DATA: Congestion, dyspnea

EXAM:
CHEST - 2 VIEW

[chest pa]
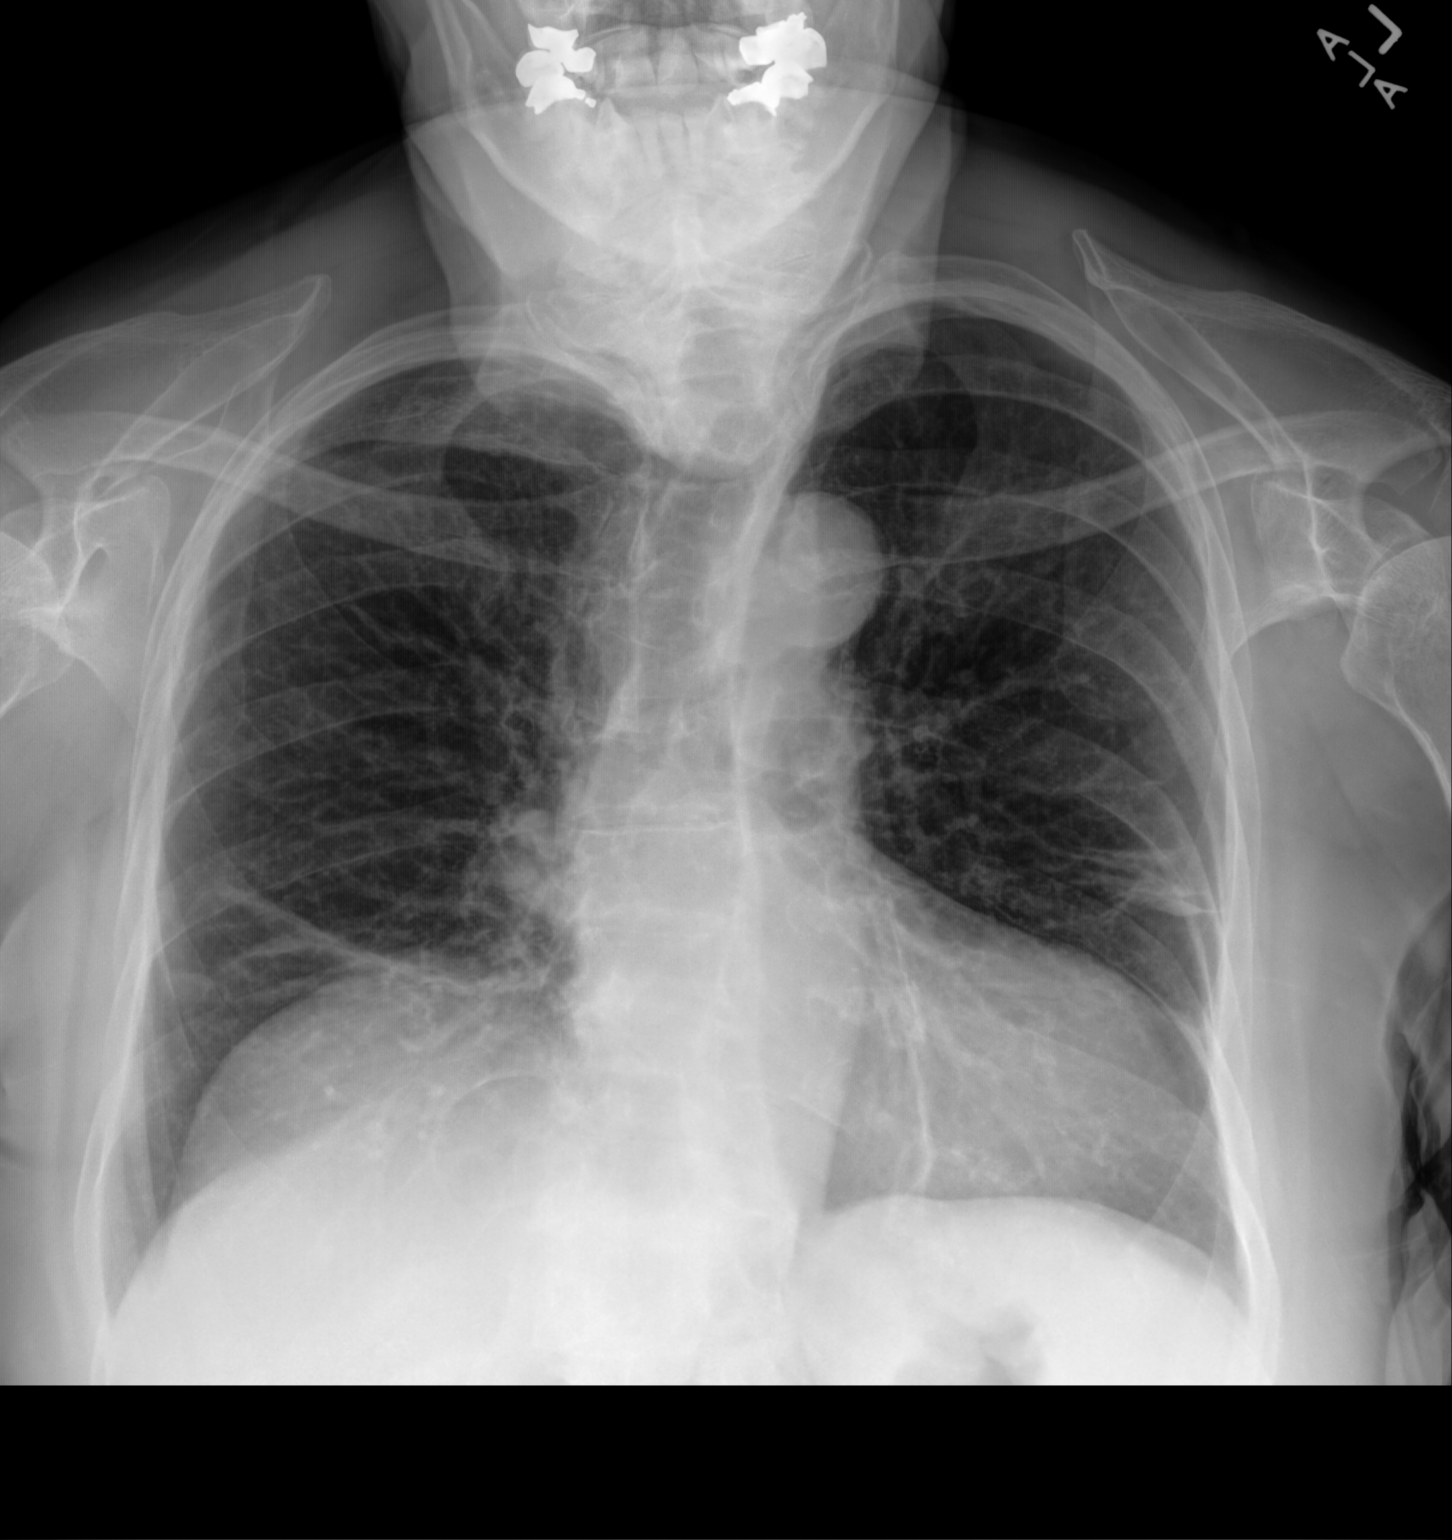

[chest lat]
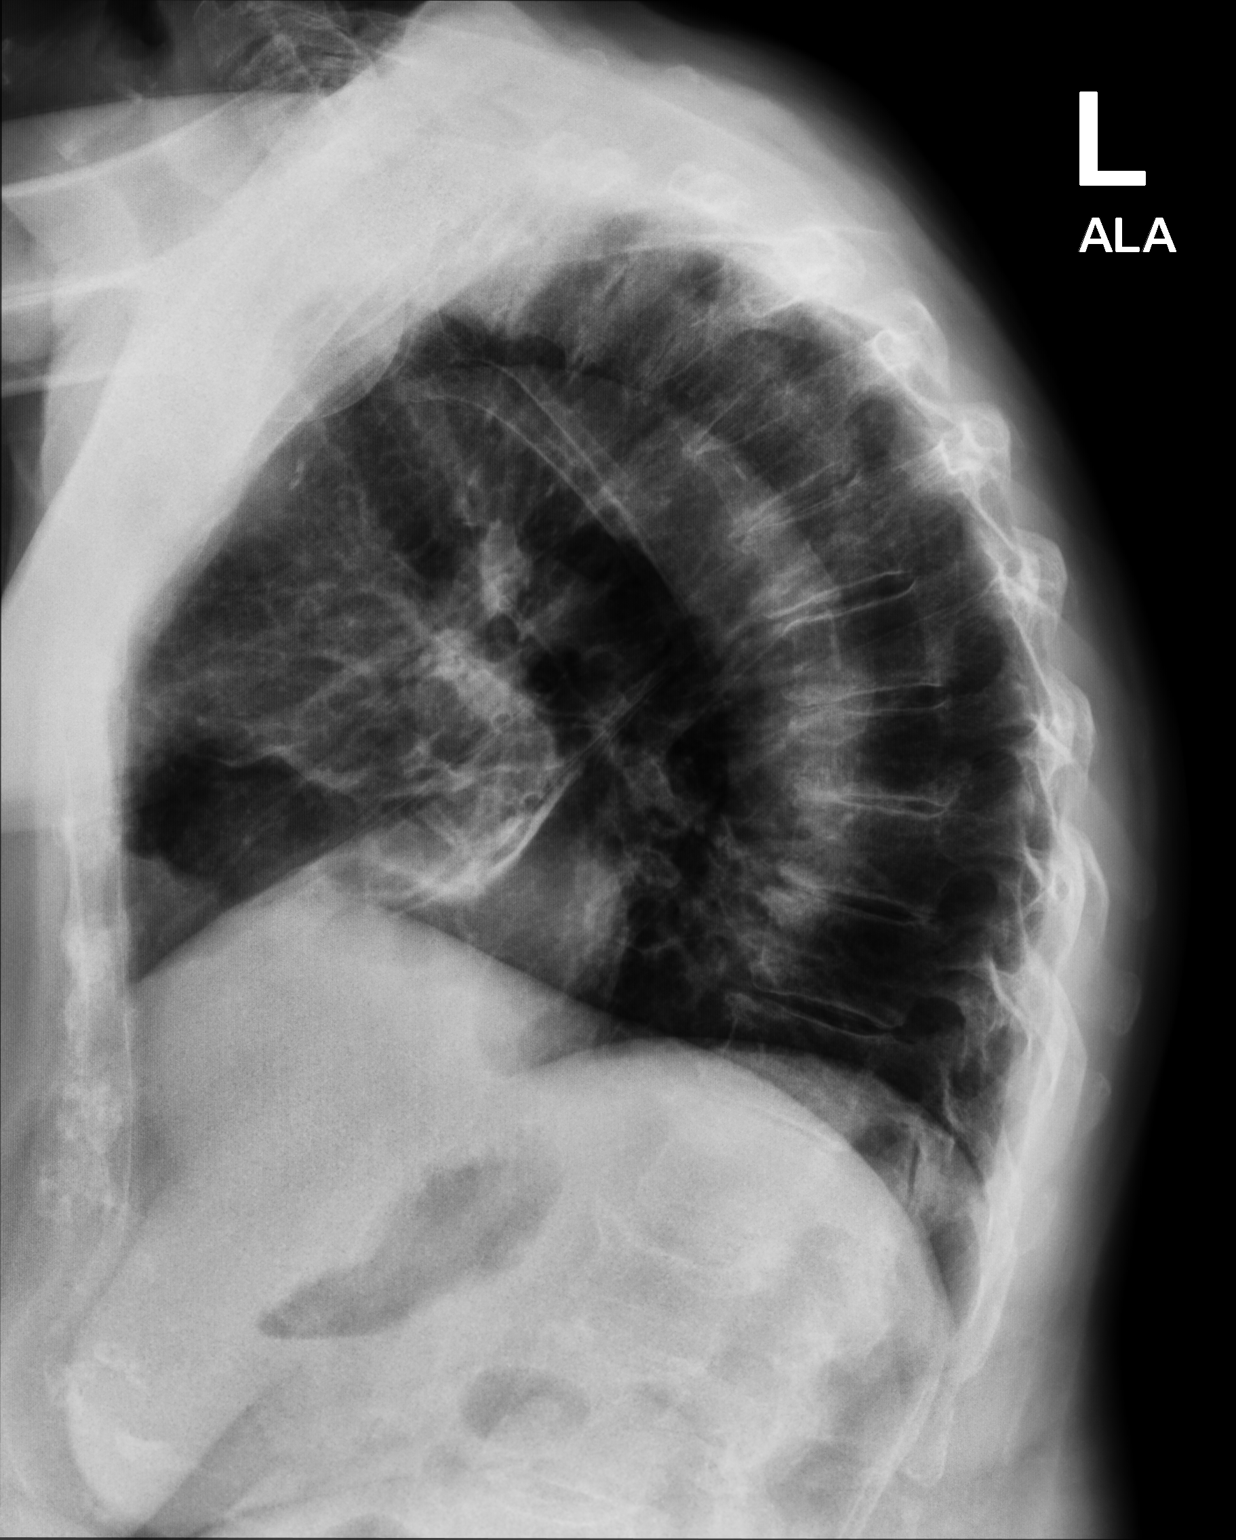

[2 of 2 positions shown; findings below may reference images not displayed]

FINDINGS: Cardiomegaly. Bandlike scarring or atelectasis of the bilateral lung
bases. Disc degenerative disease of the thoracic spine.
IMPRESSION: Cardiomegaly. Bandlike scarring or atelectasis of the bilateral lung
bases.

## 2022-06-22 ENCOUNTER — Ambulatory Visit (INDEPENDENT_AMBULATORY_CARE_PROVIDER_SITE_OTHER): Payer: Medicare Other

## 2022-06-22 DIAGNOSIS — M810 Age-related osteoporosis without current pathological fracture: Secondary | ICD-10-CM | POA: Diagnosis not present

## 2022-06-22 MED ORDER — DENOSUMAB 60 MG/ML ~~LOC~~ SOSY
60.0000 mg | PREFILLED_SYRINGE | Freq: Once | SUBCUTANEOUS | Status: AC
  Administered 2022-06-22: 60 mg via SUBCUTANEOUS

## 2022-06-23 DIAGNOSIS — J387 Other diseases of larynx: Secondary | ICD-10-CM | POA: Diagnosis not present

## 2022-06-23 DIAGNOSIS — R490 Dysphonia: Secondary | ICD-10-CM | POA: Diagnosis not present

## 2022-06-29 ENCOUNTER — Ambulatory Visit
Admission: EM | Admit: 2022-06-29 | Discharge: 2022-06-29 | Disposition: A | Discharge status: Home / Self Care | Payer: Medicare Other | Attending: Family Medicine | Admitting: Family Medicine

## 2022-06-29 DIAGNOSIS — J0100 Acute maxillary sinusitis, unspecified: Secondary | ICD-10-CM | POA: Diagnosis not present

## 2022-06-29 MED ORDER — PREDNISONE 20 MG PO TABS: ORAL_TABLET | ORAL | 0 refills | Status: DC

## 2022-06-29 MED ORDER — AMOXICILLIN-POT CLAVULANATE 875-125 MG PO TABS: 1.0000 | ORAL_TABLET | Freq: Two times a day (BID) | ORAL | 0 refills | Status: DC

## 2022-06-29 NOTE — ED Triage Notes (Signed)
Pt presents to Urgent Care with c/o nasal congestion/drainage and hoarseness x approx 1.5 weeks. Also states she has occasional cough and eye drainage. Afebrile. No COVID test done.

## 2022-06-29 NOTE — Discharge Instructions (Addendum)
Advised patient to take medications as directed with food to completion.  Advised patient to take prednisone with first dose of Augmentin for the next 5 of 7 days.  Encouraged patient to increase daily water intake while taking these medications.  Advised if symptoms worsen and/or unresolved please follow-up with PCP or here for further evaluation.

## 2022-06-29 NOTE — ED Provider Notes (Signed)
[name]    CSN: [id number] Arrival date & time: 06/29/22  1552      History   Chief Complaint Chief Complaint  Patient presents with   Nasal Congestion   Cough    HPI [name] [name] is a 82 y.o. female.   HPI Very pleasant 82 year old female presents with nasal congestion, hoarseness and cough for 10 days.  PMH significant for hypothyroidism, osteoporosis, and heart palpitations.  Past Medical History:  Diagnosis Date   COVID 10/25/2020   Dog bite of thigh 04/09/2012   Elevated lipids    GERD (gastroesophageal reflux disease)    Hypothyroidism    post thyroidectomy   Osteoarthritis    Osteopenia    t -2.5 hip in 2005 by Dexa   Osteoporosis    commpr fx after dog related fall  injury   Seasonal rhinitis    spring and fall   Skin cancer    squamous cell ca chest   Vaginal prolapse    hs corrected    Patient Active Problem List   Diagnosis Date Noted   Medication management 10/16/2013   Medicare annual wellness visit, subsequent 07/16/2012   Heart palpitations 05-01-11   Family history of sudden death 05-01-11   PALPITATIONS 03/31/2010   BACK PAIN, LUMBAR 10/13/2009   HEARTBURN 10/13/2009   FOOT PAIN 03/31/2008   Hypothyroidism 01/25/2007   Osteoporosis 01/25/2007   OSTEOPENIA 01/25/2007    Past Surgical History:  Procedure Laterality Date   ABDOMINAL HYSTERECTOMY  07/2007   bladders sling surgery  07/2007   COLONOSCOPY  2006   THYROIDECTOMY      OB History     Gravida  1   Para  1   Term  1   Preterm      AB      Living  1      SAB      IAB      Ectopic      Multiple      Live Births               Home Medications    Prior to Admission medications   Medication Sig Start Date End Date Taking? Authorizing Provider  amoxicillin-clavulanate (AUGMENTIN) 875-125 MG tablet Take 1 tablet by mouth every 12 (twelve) hours. 06/29/22  Yes Eliezer Lofts, FNP  predniSONE (DELTASONE) 20 MG tablet Take 3 tabs PO  daily x 5 days. 06/29/22  Yes Eliezer Lofts, FNP  albuterol (VENTOLIN HFA) 108 (90 Base) MCG/ACT inhaler Inhale into the lungs. Patient not taking: Reported on 05/24/2022 04/22/22 04/22/23  [provider]  amLODipine (NORVASC) 5 MG tablet Take 1 tablet (5 mg total) by mouth daily. 05/13/22   Janina Mayo, MD  aspirin 325 MG EC tablet Take 325 mg by mouth every 6 (six) hours as needed for pain.    [provider]  azelastine (ASTELIN) 0.1 % nasal spray Place 1 spray into both nostrils 2 (two) times daily. 03/15/22   [provider]  b complex vitamins tablet Take 1 tablet by mouth daily.    [provider]  CALCIUM PO Take 1,200 mg by mouth daily.     [provider]  denosumab (PROLIA) 60 MG/ML SOLN injection Inject 60 mg into the skin every 6 (six) months. Administer in upper arm, thigh, or abdomen    [provider]  levothyroxine (SYNTHROID) 100 MCG tablet TAKE 1 TABLET(100 MCG) BY MOUTH DAILY 11/01/21   Panosh, Standley Brooking, MD  pantoprazole (PROTONIX) 20 MG tablet TAKE 1 TABLET(20 MG) BY MOUTH DAILY 03/10/22   Panosh, Standley Brooking, MD  Spacer/Aero-Holding Chambers (EASIVENT) inhaler See admin instructions. 04/22/22 04/22/23  [provider]    Family History Family History  Problem Relation Age of Onset   Heart attack Mother    Alcohol abuse Mother    Kidney cancer Father    Hypertension Maternal Grandmother    Esophageal cancer Neg Hx    Colon cancer Neg Hx    Pancreatic cancer Neg Hx    Stomach cancer Neg Hx     Social History Social History   Tobacco Use   Smoking status: Never   Smokeless tobacco: Never  Vaping Use   Vaping Use: Never used  Substance Use Topics   Alcohol use: Yes    Comment: occasionally   Drug use: No     Allergies   Capsicum annuum extract & derivative (bell pepper) [capsicum], Milk-related compounds, Onion, and Other   Review of Systems Review of Systems  HENT:  Positive for congestion,  postnasal drip and sinus pressure.   Respiratory:  Positive for cough.   All other systems reviewed and are negative.    Physical Exam Triage Vital Signs ED Triage Vitals  Enc Vitals Group     BP 06/29/22 1730 (!) 151/78     Pulse Rate 06/29/22 1730 79     Resp 06/29/22 1730 20     Temp 06/29/22 1730 97.9 F (36.6 C)     Temp Source 06/29/22 1730 Oral     SpO2 06/29/22 1730 96 %     Weight 06/29/22 1725 142 lb (64.4 kg)     Height 06/29/22 1725 5' (1.524 m)     Head Circumference --      Peak Flow --      Pain Score 06/29/22 1725 0     Pain Loc --      Pain Edu? --      Excl. in [place]? --    No data found.  Updated Vital Signs BP (!) 151/78 (BP Location: Right Arm)   Pulse 79   Temp 97.9 F (36.6 C) (Oral)   Resp 20   Ht 5' (1.524 m)   Wt 142 lb (64.4 kg)   SpO2 96%   BMI 27.73 kg/m   Visual Acuity Right Eye Distance:   Left Eye Distance:   Bilateral Distance:    Right Eye Near:   Left Eye Near:    Bilateral Near:     Physical Exam Vitals and nursing note reviewed.  Constitutional:      General: She is not in acute distress.    Appearance: Normal appearance. She is normal weight. She is not ill-appearing.  HENT:     Head: Normocephalic and atraumatic.     Right Ear: Tympanic membrane and external ear normal.     Left Ear: Tympanic membrane and external ear normal.     Ears:     Comments: Moderate eustachian tube dysfunction noted bilaterally    Nose:     Right Sinus: Maxillary sinus tenderness present.     Left Sinus: Maxillary sinus tenderness present.     Mouth/Throat:     Mouth: Mucous membranes are moist.     Pharynx: Oropharynx is clear.  Eyes:     Extraocular Movements: Extraocular movements intact.     Conjunctiva/sclera: Conjunctivae normal.     Pupils: Pupils are equal, round, and reactive to light.  Cardiovascular:  Rate and Rhythm: Normal rate and regular rhythm.     Pulses: Normal pulses.     Heart sounds: Normal heart sounds.   Pulmonary:     Effort: Pulmonary effort is normal.     Breath sounds: Normal breath sounds. No wheezing, rhonchi or rales.  Musculoskeletal:        General: Normal range of motion.     Cervical back: Normal range of motion and neck supple.  Skin:    General: Skin is warm and dry.  Neurological:     General: No focal deficit present.     Mental Status: She is alert and oriented to person, place, and time. Mental status is at baseline.      UC Treatments / Results  Labs (all labs ordered are listed, but only abnormal results are displayed) Labs Reviewed - No data to display  EKG   Radiology No results found.  Procedures Procedures (including critical care time)  Medications Ordered in UC Medications - No data to display  Initial Impression / Assessment and Plan / UC Course  I have reviewed the triage vital signs and the nursing notes.  Pertinent labs & imaging results that were available during my care of the patient were reviewed by me and considered in my medical decision making (see chart for details).     MDM: 1.  Acute maxillary sinusitis, recurrence not specified-Rx'd Augmentin; 2.  Sinus pressure-Rx'd prednisone. Advised patient to take medications as directed with food to completion.  Advised patient to take prednisone with first dose of Augmentin for the next 5 of 7 days.  Encouraged patient to increase daily water intake while taking these medications.  Advised if symptoms worsen and/or unresolved please follow-up with PCP or here for further evaluation.  Patient discharged home, hemodynamically stable. Final Clinical Impressions(s) / UC Diagnoses   Final diagnoses:  Acute maxillary sinusitis, recurrence not specified     Discharge Instructions      Advised patient to take medications as directed with food to completion.  Advised patient to take prednisone with first dose of Augmentin for the next 5 of 7 days.  Encouraged patient to increase daily water  intake while taking these medications.  Advised if symptoms worsen and/or unresolved please follow-up with PCP or here for further evaluation.     ED Prescriptions     Medication Sig Dispense Auth. Provider   amoxicillin-clavulanate (AUGMENTIN) 875-125 MG tablet Take 1 tablet by mouth every 12 (twelve) hours. 14 tablet Eliezer Lofts, FNP   predniSONE (DELTASONE) 20 MG tablet Take 3 tabs PO daily x 5 days. 15 tablet Eliezer Lofts, FNP      PDMP not reviewed this encounter.   Eliezer Lofts, [place] 06/29/22 1827

## 2022-06-30 DIAGNOSIS — M4316 Spondylolisthesis, lumbar region: Secondary | ICD-10-CM | POA: Diagnosis not present

## 2022-06-30 DIAGNOSIS — M47816 Spondylosis without myelopathy or radiculopathy, lumbar region: Secondary | ICD-10-CM | POA: Diagnosis not present

## 2022-06-30 DIAGNOSIS — M533 Sacrococcygeal disorders, not elsewhere classified: Secondary | ICD-10-CM | POA: Diagnosis not present

## 2022-06-30 DIAGNOSIS — G8929 Other chronic pain: Secondary | ICD-10-CM | POA: Diagnosis not present

## 2022-07-04 DIAGNOSIS — R293 Abnormal posture: Secondary | ICD-10-CM | POA: Diagnosis not present

## 2022-07-08 ENCOUNTER — Ambulatory Visit: Payer: Medicare Other | Admitting: Family Medicine

## 2022-07-08 DIAGNOSIS — J383 Other diseases of vocal cords: Secondary | ICD-10-CM | POA: Diagnosis not present

## 2022-07-08 DIAGNOSIS — R490 Dysphonia: Secondary | ICD-10-CM | POA: Diagnosis not present

## 2022-07-08 DIAGNOSIS — J370 Chronic laryngitis: Secondary | ICD-10-CM | POA: Diagnosis not present

## 2022-07-13 ENCOUNTER — Ambulatory Visit: Payer: Medicare Other | Admitting: Family Medicine

## 2022-07-14 ENCOUNTER — Ambulatory Visit (INDEPENDENT_AMBULATORY_CARE_PROVIDER_SITE_OTHER): Payer: Medicare Other | Admitting: Family Medicine

## 2022-07-14 ENCOUNTER — Encounter: Payer: Self-pay | Admitting: Family Medicine

## 2022-07-14 VITALS — BP 124/80 | HR 51 | Temp 98.1°F | Wt 136.8 lb

## 2022-07-14 DIAGNOSIS — K219 Gastro-esophageal reflux disease without esophagitis: Secondary | ICD-10-CM

## 2022-07-14 DIAGNOSIS — R053 Chronic cough: Secondary | ICD-10-CM | POA: Insufficient documentation

## 2022-07-14 DIAGNOSIS — R490 Dysphonia: Secondary | ICD-10-CM | POA: Insufficient documentation

## 2022-07-14 DIAGNOSIS — R051 Acute cough: Secondary | ICD-10-CM | POA: Insufficient documentation

## 2022-07-14 MED ORDER — PANTOPRAZOLE SODIUM 40 MG PO TBEC: 40.0000 mg | DELAYED_RELEASE_TABLET | Freq: Every morning | ORAL | 0 refills | Status: DC

## 2022-07-14 MED ORDER — FAMOTIDINE 40 MG PO TABS: 40.0000 mg | ORAL_TABLET | Freq: Every evening | ORAL | 0 refills | Status: DC

## 2022-07-14 NOTE — Progress Notes (Signed)
   Subjective:    Patient ID: [name] [name], female    DOB: April 03, 1941, 82 y.o.   MRN: [id number]  HPI Here for a chronic dry cough and hoarseness that started 3 months ago. She does not feel sick. No PND or ST or fever. She does not smoke. She has a hx of GERD and she takes Pantoprazole 20 mg every morning. She still gets occasional heartburn though. She has been to the urgent care in [place] twice, on 04-18-22 and again on 06-29-22, for this and both times she was diagnosed with a sinus infection, and both times she was treated with a course of Augmentin and Prednisone. These did not improve her symptoms at all. She has had an extensive workup through the ENT and the Allergy and Asthma departments at [facility] in the past 2 months. This has included two different nasolaryngoscopies, a sinus CT, and a chest CT, but nothing has been found. She has never taken an ACE inhibitor. She tries to drink plenty of water.    Review of Systems  Constitutional: Negative.   HENT:  Positive for voice change. Negative for congestion, postnasal drip, sinus pressure, sore throat and trouble swallowing.   Eyes: Negative.   Respiratory:  Positive for cough. Negative for choking, chest tightness, shortness of breath and wheezing.   Cardiovascular: Negative.        Objective:   Physical Exam Constitutional:      Appearance: Normal appearance.     Comments: Her voice is a little hoarse   HENT:     Right Ear: Tympanic membrane, ear canal and external ear normal.     Left Ear: Tympanic membrane, ear canal and external ear normal.     Nose: Nose normal.     Mouth/Throat:     Pharynx: Oropharynx is clear.  Eyes:     Conjunctiva/sclera: Conjunctivae normal.  Cardiovascular:     Rate and Rhythm: Normal rate and regular rhythm.     Pulses: Normal pulses.     Heart sounds: Normal heart sounds.  Pulmonary:     Effort: Pulmonary effort is normal.     Breath sounds: Normal breath sounds.  Lymphadenopathy:      Cervical: No cervical adenopathy.  Neurological:     Mental Status: She is alert.           Assessment & Plan:  Chronic cough and hoarseness. I suspect this is the result of partially controlled GERD, particularly at night. We will increase the morning Pantoprazole to 40 mg and we will add Famotidine 40 mg in the evenings. Follow up in 2 weeks. We spent a total of ( 34  ) minutes reviewing records and discussing these issues.  Alysia Penna, MD

## 2022-07-20 ENCOUNTER — Ambulatory Visit (INDEPENDENT_AMBULATORY_CARE_PROVIDER_SITE_OTHER)
Admission: RE | Admit: 2022-07-20 | Discharge: 2022-07-20 | Disposition: A | Discharge status: Home / Self Care | Payer: Medicare Other | Source: Ambulatory Visit | Attending: Family Medicine | Admitting: Family Medicine

## 2022-07-20 ENCOUNTER — Ambulatory Visit (INDEPENDENT_AMBULATORY_CARE_PROVIDER_SITE_OTHER): Payer: Medicare Other | Admitting: Family Medicine

## 2022-07-20 ENCOUNTER — Encounter: Payer: Self-pay | Admitting: Family Medicine

## 2022-07-20 VITALS — BP 128/80 | HR 94 | Temp 98.7°F | Resp 16 | Ht 60.0 in | Wt 137.4 lb

## 2022-07-20 DIAGNOSIS — E039 Hypothyroidism, unspecified: Secondary | ICD-10-CM

## 2022-07-20 DIAGNOSIS — R053 Chronic cough: Secondary | ICD-10-CM | POA: Diagnosis not present

## 2022-07-20 DIAGNOSIS — Z0189 Encounter for other specified special examinations: Secondary | ICD-10-CM | POA: Diagnosis not present

## 2022-07-20 DIAGNOSIS — E785 Hyperlipidemia, unspecified: Secondary | ICD-10-CM | POA: Diagnosis not present

## 2022-07-20 DIAGNOSIS — R0602 Shortness of breath: Secondary | ICD-10-CM | POA: Diagnosis not present

## 2022-07-20 DIAGNOSIS — J439 Emphysema, unspecified: Secondary | ICD-10-CM | POA: Diagnosis not present

## 2022-07-20 DIAGNOSIS — R5383 Other fatigue: Secondary | ICD-10-CM | POA: Diagnosis not present

## 2022-07-20 LAB — CBC
HCT: 38.9 % (ref 36.0–46.0)
Hemoglobin: 13.3 g/dL (ref 12.0–15.0)
MCHC: 34.3 g/dL (ref 30.0–36.0)
MCV: 97.6 fl (ref 78.0–100.0)
Platelets: 294 10*3/uL (ref 150.0–400.0)
RBC: 3.99 Mil/uL (ref 3.87–5.11)
RDW: 15.2 % (ref 11.5–15.5)
WBC: 5.1 10*3/uL (ref 4.0–10.5)

## 2022-07-20 LAB — COMPREHENSIVE METABOLIC PANEL
ALT: 21 U/L (ref 0–35)
AST: 28 U/L (ref 0–37)
Albumin: 4.4 g/dL (ref 3.5–5.2)
Alkaline Phosphatase: 28 U/L — ABNORMAL LOW (ref 39–117)
BUN: 15 mg/dL (ref 6–23)
CO2: 29 mEq/L (ref 19–32)
Calcium: 9.2 mg/dL (ref 8.4–10.5)
Chloride: 105 mEq/L (ref 96–112)
Creatinine, Ser: 0.54 mg/dL (ref 0.40–1.20)
GFR: 86.63 mL/min (ref >60.00)
Glucose, Bld: 91 mg/dL (ref 70–99)
Potassium: 3.6 mEq/L (ref 3.5–5.1)
Sodium: 141 mEq/L (ref 135–145)
Total Bilirubin: 1.6 mg/dL — ABNORMAL HIGH (ref 0.2–1.2)
Total Protein: 6.7 g/dL (ref 6.0–8.3)

## 2022-07-20 LAB — T4, FREE: Free T4: 0.92 ng/dL (ref 0.60–1.60)

## 2022-07-20 LAB — TSH: TSH: 3.19 u[IU]/mL (ref 0.35–5.50)

## 2022-07-20 LAB — LIPID PANEL
Cholesterol: 230 mg/dL — ABNORMAL HIGH (ref 0–200)
HDL: 68.1 mg/dL (ref >39.00)
LDL Cholesterol: 138 mg/dL — ABNORMAL HIGH (ref 0–99)
NonHDL: 162.08
Total CHOL/HDL Ratio: 3
Triglycerides: 121 mg/dL (ref 0.0–149.0)
VLDL: 24.2 mg/dL (ref 0.0–40.0)

## 2022-07-20 LAB — C-REACTIVE PROTEIN: CRP: <1 mg/dL (ref 0.5–20.0)

## 2022-07-20 NOTE — Progress Notes (Unsigned)
ACUTE VISIT Chief Complaint  Patient presents with   Cough   Hoarse   HPI: [name] is a 82 y.o. female, who is here today requesting labs to be done.  Chronic cough and dysphonia, has been evaluated by ENT. She also states that her taste is a "little off" for months, according to pt, it was addressed with ENT. Completed treatment with abx and Prednisone, did not notice significant improvement.  Dx'ed with vocal fold atrophy. Problems are stable. "Little" SOB for the past couple months and reporting negative cardiac work up. Cough This is a recurrent problem. The current episode started more than 1 year ago. The problem has been unchanged. The cough is Non-productive. Associated symptoms include shortness of breath. Pertinent negatives include no chest pain, chills, ear congestion, ear pain, fever, headaches, heartburn, hemoptysis, myalgias, nasal congestion, postnasal drip, rash, sore throat, weight loss or wheezing. Nothing aggravates the symptoms. She has tried oral steroids for the symptoms. Her past medical history is significant for environmental allergies.  She is c/o fatigue for a couple months, it usually happens at the end of the day. She refers to fatigue and SOB as the same. Denies depressed mood. Sleeps well, 7 hours in average, she feels rested when she gets up.. She denies experiencing  palpitations, tremors, changes in bowel habits, or urinary symptom. She occasionally experiences mild pain on LLQ but it is intermittent and she has no concerned about this. No associated symptoms. . States that her son, who lives in [place], ask her to request TSH,T4,T3,CMP,CBC with dif,UA, lipid panel,CRP,and DHEA sulfate.  Lab Results  Component Value Date   CHOL 199 08/30/2021   HDL 55.70 08/30/2021   LDLCALC 120 (H) 08/30/2021   LDLDIRECT 134.1 12/27/2006   TRIG 120.0 08/30/2021   CHOLHDL 4 08/30/2021   Hypothyroidism on Synthroid 100 mcg daily. Lab Results  Component  Value Date   TSH 1.15 08/30/2021   Lab Results  Component Value Date   WBC 8.4 07/08/2021   HGB 12.9 07/08/2021   HCT 40.0 07/08/2021   MCV 97.2 07/08/2021   PLT 298.0 07/08/2021   Lab Results  Component Value Date   CREATININE 0.77 07/08/2021   BUN 18 07/08/2021   NA 143 07/08/2021   K 3.9 07/08/2021   CL 108 07/08/2021   CO2 29 07/08/2021   Lab Results  Component Value Date   ALT 18 07/08/2021   AST 30 07/08/2021   ALKPHOS 32 (L) 07/08/2021   BILITOT 1.1 07/08/2021    Review of Systems  Constitutional:  Negative for chills, fever and weight loss.  HENT:  Negative for ear pain, postnasal drip and sore throat.   Respiratory:  Positive for cough and shortness of breath. Negative for hemoptysis and wheezing.   Cardiovascular:  Negative for chest pain.  Gastrointestinal:  Negative for blood in stool, heartburn, nausea and vomiting.  Endocrine: Negative for cold intolerance and heat intolerance.  Genitourinary:  Negative for decreased urine volume, dysuria and hematuria.  Musculoskeletal:  Negative for myalgias.  Skin:  Negative for rash.  Allergic/Immunologic: Positive for environmental allergies.  Neurological:  Negative for syncope, weakness and headaches.  See other pertinent positives and negatives in HPI.  Current Outpatient Medications on File Prior to Visit  Medication Sig Dispense Refill   amLODipine (NORVASC) 5 MG tablet Take 1 tablet (5 mg total) by mouth daily. 90 tablet 3   aspirin 325 MG EC tablet Take 325 mg by mouth every 6 (six) hours as  needed for pain.     azelastine (ASTELIN) 0.1 % nasal spray Place 1 spray into both nostrils 2 (two) times daily.     b complex vitamins tablet Take 1 tablet by mouth daily.     CALCIUM PO Take 1,200 mg by mouth daily.      denosumab (PROLIA) 60 MG/ML SOLN injection Inject 60 mg into the skin every 6 (six) months. Administer in upper arm, thigh, or abdomen     famotidine (PEPCID) 40 MG tablet Take 1 tablet (40 mg total)  by mouth every evening. 30 tablet 0   levothyroxine (SYNTHROID) 100 MCG tablet TAKE 1 TABLET(100 MCG) BY MOUTH DAILY 90 tablet 2   pantoprazole (PROTONIX) 40 MG tablet Take 1 tablet (40 mg total) by mouth in the morning. 30 tablet 0   Spacer/Aero-Holding Chambers (EASIVENT) inhaler See admin instructions.     albuterol (VENTOLIN HFA) 108 (90 Base) MCG/ACT inhaler Inhale into the lungs. (Patient not taking: Reported on 05/24/2022)     No current facility-administered medications on file prior to visit.   Past Medical History:  Diagnosis Date   COVID 10/25/2020   Dog bite of thigh 04/09/2012   Elevated lipids    GERD (gastroesophageal reflux disease)    Hypothyroidism    post thyroidectomy   Osteoarthritis    Osteopenia    t -2.5 hip in 2005 by Dexa   Osteoporosis    commpr fx after dog related fall  injury   Seasonal rhinitis    spring and fall   Skin cancer    squamous cell ca chest   Vaginal prolapse    hs corrected   Allergies  Allergen Reactions   Capsicum Annuum Extract & Derivative (Bell Pepper) [Capsicum]     Other reaction(s): Other (See Comments)   Milk-Related Compounds Other (See Comments)   Onion Other (See Comments)   Other Other (See Comments)    [name]    Social History   Socioeconomic History   Marital status: Widowed    Spouse name: Not on file   Number of children: Not on file   Years of education: Not on file   Highest education level: Associate degree: academic program  Occupational History   Not on file  Tobacco Use   Smoking status: Never   Smokeless tobacco: Never  Vaping Use   Vaping Use: Never used  Substance and Sexual Activity   Alcohol use: Yes    Comment: occasionally   Drug use: No   Sexual activity: Not Currently    Partners: Male    Birth control/protection: Surgical    Comment: hysterectomy, older than 16, less than 5  Other Topics Concern   Not on file  Social History Narrative   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    Regular Exercise- yes   Now on rx   Working  Full time        hh of 2 cats and dogs in hh    Social Determinants of Health   Financial Resource Strain: Low Risk  (05/24/2022)   Overall Financial Resource Strain (CARDIA)    Difficulty of Paying Living Expenses: Not hard at all  Food Insecurity: No Food Insecurity (05/24/2022)   Hunger Vital Sign    Worried About Running Out of Food in the Last Year: Never true    [place] in the Last Year: Never true  Transportation Needs: No Transportation Needs (05/24/2022)   PRAPARE - Transportation    Lack  of Transportation (Medical): No    Lack of Transportation (Non-Medical): No  Physical Activity: Insufficiently Active (05/24/2022)   Exercise Vital Sign    Days of Exercise per Week: 5 days    Minutes of Exercise per Session: 20 min  Stress: No Stress Concern Present (05/24/2022)   [place]    Feeling of Stress : Not at all  Social Connections: Moderately Integrated (05/24/2022)   Social Connection and Isolation Panel [NHANES]    Frequency of Communication with Friends and Family: More than three times a week    Frequency of Social Gatherings with Friends and Family: Once a week    Attends Religious Services: More than 4 times per year    Active Member of [employer] or Organizations: Yes    Attends Archivist Meetings: More than 4 times per year    Marital Status: Widowed   Today's Vitals   07/20/22 1005  BP: 128/80  Pulse: 94  Resp: 16  Temp: 98.7 F (37.1 C)  TempSrc: Oral  SpO2: 96%  Weight: 137 lb 6 oz (62.3 kg)  Height: 5' (1.524 m)   Body mass index is 26.83 kg/m.  Physical Exam Vitals and nursing note reviewed.  Constitutional:      General: She is not in acute distress.    Appearance: She is well-developed.  HENT:     Head: Normocephalic and atraumatic.     Mouth/Throat:     Mouth: Mucous membranes are moist.     Pharynx:  Oropharynx is clear.  Eyes:     Conjunctiva/sclera: Conjunctivae normal.  Cardiovascular:     Rate and Rhythm: Normal rate. Rhythm irregular. Occasional Extrasystoles are present.    Heart sounds: No murmur heard. Pulmonary:     Effort: Pulmonary effort is normal. No respiratory distress.     Breath sounds: Examination of the left-lower field reveals rales. Rales present.  Abdominal:     Palpations: Abdomen is soft. There is no hepatomegaly or mass.     Tenderness: There is no abdominal tenderness.  Skin:    General: Skin is warm.     Findings: No erythema or rash.  Neurological:     General: No focal deficit present.     Mental Status: She is alert and oriented to person, place, and time.     Comments: Stable gait, not assisted.  Psychiatric:        Mood and Affect: Mood and affect normal.   ASSESSMENT AND PLAN:  [name] [name] was seen today for cough and hoarse.  Diagnoses and all orders for this visit: Lab Results  Component Value Date   CREATININE 0.54 07/20/2022   BUN 15 07/20/2022   NA 141 07/20/2022   K 3.6 07/20/2022   CL 105 07/20/2022   CO2 29 07/20/2022   Lab Results  Component Value Date   ALT 21 07/20/2022   AST 28 07/20/2022   ALKPHOS 28 (L) 07/20/2022   BILITOT 1.6 (H) 07/20/2022   Lab Results  Component Value Date   WBC 5.1 07/20/2022   HGB 13.3 07/20/2022   HCT 38.9 07/20/2022   MCV 97.6 07/20/2022   PLT 294.0 07/20/2022   Lab Results  Component Value Date   TSH 3.19 07/20/2022   Lab Results  Component Value Date   CHOL 230 (H) 07/20/2022   HDL 68.10 07/20/2022   LDLCALC 138 (H) 07/20/2022   LDLDIRECT 134.1 12/27/2006   TRIG 121.0 07/20/2022   CHOLHDL 3  07/20/2022   Other fatigue Reporting this as a new problem. We discussed possible etiologies: Systemic illness, immunologic,endocrinology,sleep disorder, psychiatric/psychologic, infectious,medications side effects, and idiopathic. Examination today does not suggest a serious  process. Mildly irregular HR today, has had sinus arrhythmia seen on EKG.  Stress test 11/2021 negative.  Further recommendations will be given according to lab results.  Patient request for diagnostic testing -     DHEA-sulfate -     C-reactive protein; Future -     Lipid panel; Future -     Urinalysis with Culture Reflex -     Lipid panel -     C-reactive protein  Hypothyroidism, unspecified type Problem has been well controlled. Continue Synthroid same dose. Further recommendations according to TSH result.  Chronic cough Today noted mild fine rales left base. She is reporting cough as stable. CXR in 10/2021 done for congestion and SOB showed bandlike scarring or atelectasis of the bilateral lung bases. CXR ordered today. Monitor for new symptoms. Further recommendations will be given according to imaging results.  Hyperlipidemia, unspecified hyperlipidemia type  TC and LDL mildly elevated. Will recommend low fat diet for now, she can discuss pharmacologic options,if interested in med,  with PCP>  Other orders -     Urine Culture -     REFLEXIVE URINE CULTURE  Return in about 4 weeks (around 08/17/2022).  Beaulah G. Martinique, MD  [facility]. [place] office.

## 2022-07-20 NOTE — Patient Instructions (Addendum)
A few things to remember from today's visit:  Other fatigue - Plan: CBC, TSH, Comprehensive metabolic panel, C-reactive protein  Patient request for diagnostic testing - Plan: DHEA-sulfate, C-reactive protein, Lipid panel, Urinalysis with Culture Reflex  Hypothyroidism, unspecified type - Plan: TSH, T4, free  Chronic cough - Plan: DG Chest 2 View  Labs ordered as requested. No changes in medications.  If you need refills for medications you take chronically, please call your pharmacy. Do not use My Chart to request refills or for acute issues that need immediate attention. If you send a my chart message, it may take a few days to be addressed, specially if I am not in the office.  Please be sure medication list is accurate. If a new problem present, please set up appointment sooner than planned today.

## 2022-07-21 DIAGNOSIS — J370 Chronic laryngitis: Secondary | ICD-10-CM | POA: Diagnosis not present

## 2022-07-21 DIAGNOSIS — R490 Dysphonia: Secondary | ICD-10-CM | POA: Diagnosis not present

## 2022-07-21 DIAGNOSIS — J383 Other diseases of vocal cords: Secondary | ICD-10-CM | POA: Diagnosis not present

## 2022-07-22 LAB — URINALYSIS W MICROSCOPIC + REFLEX CULTURE
Bacteria, UA: NONE SEEN /HPF
Bilirubin Urine: NEGATIVE
Glucose, UA: NEGATIVE
Hgb urine dipstick: NEGATIVE
Hyaline Cast: NONE SEEN /LPF
Ketones, ur: NEGATIVE
Nitrites, Initial: NEGATIVE
Protein, ur: NEGATIVE
RBC / HPF: NONE SEEN /HPF (ref 0–2)
Specific Gravity, Urine: 1.019 (ref 1.001–1.035)
pH: 5.5 (ref 5.0–8.0)

## 2022-07-22 LAB — DHEA-SULFATE: DHEA-SO4: 12 ug/dL (ref 4–157)

## 2022-07-22 LAB — URINE CULTURE
MICRO NUMBER:: 14469565
Result:: NO GROWTH
SPECIMEN QUALITY:: ADEQUATE

## 2022-07-22 LAB — CULTURE INDICATED

## 2022-07-25 ENCOUNTER — Other Ambulatory Visit: Payer: Self-pay

## 2022-07-25 DIAGNOSIS — R053 Chronic cough: Secondary | ICD-10-CM

## 2022-07-27 ENCOUNTER — Encounter: Payer: Self-pay | Admitting: Family Medicine

## 2022-07-27 NOTE — Addendum Note (Signed)
Addended by: Rodrigo Ran on: 07/27/2022 02:03 PM   Modules accepted: Orders

## 2022-08-01 DIAGNOSIS — M40209 Unspecified kyphosis, site unspecified: Secondary | ICD-10-CM | POA: Diagnosis not present

## 2022-08-01 DIAGNOSIS — J9811 Atelectasis: Secondary | ICD-10-CM | POA: Diagnosis not present

## 2022-08-01 DIAGNOSIS — R0609 Other forms of dyspnea: Secondary | ICD-10-CM | POA: Diagnosis not present

## 2022-08-01 DIAGNOSIS — R0602 Shortness of breath: Secondary | ICD-10-CM | POA: Diagnosis not present

## 2022-08-03 NOTE — Telephone Encounter (Signed)
error 

## 2022-08-06 ENCOUNTER — Other Ambulatory Visit: Payer: Self-pay | Admitting: Internal Medicine

## 2022-08-08 DIAGNOSIS — R293 Abnormal posture: Secondary | ICD-10-CM | POA: Diagnosis not present

## 2022-08-09 DIAGNOSIS — J370 Chronic laryngitis: Secondary | ICD-10-CM | POA: Diagnosis not present

## 2022-08-09 DIAGNOSIS — R490 Dysphonia: Secondary | ICD-10-CM | POA: Diagnosis not present

## 2022-08-09 DIAGNOSIS — J383 Other diseases of vocal cords: Secondary | ICD-10-CM | POA: Diagnosis not present

## 2022-08-10 ENCOUNTER — Ambulatory Visit
Admission: EM | Admit: 2022-08-10 | Discharge: 2022-08-10 | Disposition: A | Discharge status: Home / Self Care | Payer: Medicare Other | Attending: Emergency Medicine | Admitting: Emergency Medicine

## 2022-08-10 DIAGNOSIS — B349 Viral infection, unspecified: Secondary | ICD-10-CM

## 2022-08-10 LAB — POC SARS CORONAVIRUS 2 AG -  ED: SARS Coronavirus 2 Ag: NEGATIVE

## 2022-08-10 LAB — POCT INFLUENZA A/B
Influenza A, POC: NEGATIVE
Influenza B, POC: NEGATIVE

## 2022-08-10 NOTE — ED Triage Notes (Signed)
Pt c/o intermittent headache, diarrhea and RT ear pain since Tuesday. Lots of post nasal drainage. Aspirin/Tylenol prn.

## 2022-08-10 NOTE — Discharge Instructions (Signed)
Your rapid influenza antigen test today was negative.  No further influenza testing is indicated.     Your COVID-19 PCR test is negative.   Please consider retesting in the next 2 to 3 days, particularly if you are not feeling any better.  You are welcome to return here to urgent care to have it done or you can take a home COVID-19 test.    If both your COVID-19 tests are negative, then you can safely assume that your illness is due to one of the many less serious illnesses circulating in our community right now.     Conservative care is recommended with rest, drinking plenty of clear fluids, eating only when hungry, taking supportive medications for your symptoms and avoiding being around other people.  Please remain at home until you are fever free for 24 hours without the use of antifever medications such as Tylenol and ibuprofen.   Based on my physical exam findings and the history you have provided  today, I do not recommend antibiotics at this time.  I do not believe the risks and side effects of antibiotics would outweigh any minimal benefit that they might provide.       Please read below to learn more about the medications, dosages and frequencies that I recommend to help alleviate your symptoms and to get you feeling better soon:   Tylenol (acetaminophen): This is a good fever reducer.  If your body temperature rises above 101.5 as measured with a thermometer, it is recommended that you take 1,000 mg every 8 hours until your temperature falls below 101.5, please not take more than 3,000 mg of acetaminophen either as a separate medication or as in ingredient in an over-the-counter cold/flu preparation within a 24-hour period.      Imodium (loperamide): This is a good antidiarrheal medication that you can take after every episode of diarrhea to help slow your bowels and reduce the volume as well as the frequency of diarrhea.  You take this medication up to 4 times daily.   Please follow-up  within the next 5-7 days either with your primary care provider or urgent care if your symptoms do not resolve.  If you do not have a primary care provider, we will assist you in finding one.        Thank you for visiting urgent care today.  We appreciate the opportunity to participate in your care.

## 2022-08-10 NOTE — ED Provider Notes (Signed)
[name]    CSN: [id number] Arrival date & time: 08/10/22  1510    HISTORY   Chief Complaint  Patient presents with   Headache   Diarrhea   Otalgia    RT   HPI [name] [name] is a pleasant, 82 y.o. female who presents to urgent care today. Pt c/o intermittent headache, diarrhea and RT ear pain x 1 day with a lot of nasal congestion and post nasal drainage.  Has been taking aspirin/Tylenol prn.  Patient has normal vital signs on arrival today.  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  Patient states her diarrhea has resolved as of this morning.  Patient denies fever, body aches, chills, nausea, vomiting, diarrhea, sore throat, loss of taste or smell.  The history is provided by the patient.   Past Medical History:  Diagnosis Date   COVID 10/25/2020   Dog bite of thigh 04/09/2012   Elevated lipids    GERD (gastroesophageal reflux disease)    Hypothyroidism    post thyroidectomy   Osteoarthritis    Osteopenia    t -2.5 hip in 2005 by Dexa   Osteoporosis    commpr fx after dog related fall  injury   Seasonal rhinitis    spring and fall   Skin cancer    squamous cell ca chest   Vaginal prolapse    hs corrected   Patient Active Problem List   Diagnosis Date Noted   Chronic cough 07/14/2022   Hoarseness of voice 07/14/2022   GERD (gastroesophageal reflux disease) 07/14/2022   Medication management 10/16/2013   Medicare annual wellness visit, subsequent 07/16/2012   Heart palpitations May 22, 2011   Family history of sudden death 05-22-11   PALPITATIONS 03/31/2010   BACK PAIN, LUMBAR 10/13/2009   HEARTBURN 10/13/2009   FOOT PAIN 03/31/2008   Hypothyroidism 01/25/2007   Osteoporosis 01/25/2007   OSTEOPENIA 01/25/2007   Past Surgical History:  Procedure Laterality Date   ABDOMINAL HYSTERECTOMY  07/2007   bladders sling  surgery  07/2007   COLONOSCOPY  2006   THYROIDECTOMY     OB History     Gravida  1   Para  1   Term  1   Preterm      AB      Living  1      SAB      IAB      Ectopic      Multiple      Live Births             Home Medications    Prior to Admission medications   Medication Sig Start Date End Date Taking? Authorizing Provider  albuterol (VENTOLIN HFA) 108 (90 Base) MCG/ACT inhaler Inhale into the lungs. Patient not taking: Reported on 05/24/2022 04/22/22 04/22/23  [provider]  amLODipine (NORVASC) 5 MG tablet Take 1 tablet (5 mg total) by mouth daily. 05/13/22   Janina Mayo, MD  aspirin 325 MG EC tablet Take 325 mg by mouth every 6 (six) hours as needed for pain.    [provider]  azelastine (ASTELIN) 0.1 % nasal spray Place 1 spray into both nostrils 2 (two) times daily. 03/15/22   [provider]  b complex vitamins tablet Take 1 tablet by mouth daily.    [provider]  CALCIUM PO Take 1,200 mg  by mouth daily.     [provider]  denosumab (PROLIA) 60 MG/ML SOLN injection Inject 60 mg into the skin every 6 (six) months. Administer in upper arm, thigh, or abdomen    [provider]  famotidine (PEPCID) 40 MG tablet Take 1 tablet (40 mg total) by mouth every evening. 07/14/22   Laurey Morale, MD  levothyroxine (SYNTHROID) 100 MCG tablet TAKE 1 TABLET(100 MCG) BY MOUTH DAILY 08/07/22   Dutch Quint B, FNP  pantoprazole (PROTONIX) 40 MG tablet Take 1 tablet (40 mg total) by mouth in the morning. 07/14/22   Laurey Morale, MD  Spacer/Aero-Holding Josiah Lobo (EASIVENT) inhaler See admin instructions. 04/22/22 04/22/23  [provider]    Family History Family History  Problem Relation Age of Onset   Heart attack Mother    Alcohol abuse Mother    Kidney cancer Father    Hypertension Maternal Grandmother    Esophageal cancer Neg Hx    Colon cancer Neg Hx    Pancreatic cancer Neg Hx    Stomach  cancer Neg Hx    Social History Social History   Tobacco Use   Smoking status: Never   Smokeless tobacco: Never  Vaping Use   Vaping Use: Never used  Substance Use Topics   Alcohol use: Yes    Comment: occasionally   Drug use: No   Allergies   Capsicum annuum extract & derivative (bell pepper) [capsicum], Milk-related compounds, Onion, and Other  Review of Systems Review of Systems Pertinent findings revealed after performing a 14 point review of systems has been noted in the history of present illness.  Physical Exam Vital Signs BP 136/86 (BP Location: Left Arm)   Pulse 90   Temp 98.6 F (37 C) (Oral)   Resp 17   SpO2 97%   No data found.  Physical Exam Vitals and nursing note reviewed.  Constitutional:      General: She is not in acute distress.    Appearance: Normal appearance. She is not ill-appearing.  HENT:     Head: Normocephalic and atraumatic.     Salivary Glands: Right salivary gland is not diffusely enlarged or tender. Left salivary gland is not diffusely enlarged or tender.     Right Ear: Tympanic membrane, ear canal and external ear normal. No drainage. No middle ear effusion. There is no impacted cerumen. Tympanic membrane is not erythematous or bulging.     Left Ear: Tympanic membrane, ear canal and external ear normal. No drainage.  No middle ear effusion. There is no impacted cerumen. Tympanic membrane is not erythematous or bulging.     Nose: Nose normal. No nasal deformity, septal deviation, mucosal edema, congestion or rhinorrhea.     Right Turbinates: Not enlarged, swollen or pale.     Left Turbinates: Not enlarged, swollen or pale.     Right Sinus: No maxillary sinus tenderness or frontal sinus tenderness.     Left Sinus: No maxillary sinus tenderness or frontal sinus tenderness.     Mouth/Throat:     Lips: Pink. No lesions.     Mouth: Mucous membranes are moist. No oral lesions.     Pharynx: Oropharynx is clear. Uvula midline. No posterior  oropharyngeal erythema or uvula swelling.     Tonsils: No tonsillar exudate. 0 on the right. 0 on the left.  Eyes:     General: Lids are normal.        Right eye: No discharge.  Left eye: No discharge.     Extraocular Movements: Extraocular movements intact.     Conjunctiva/sclera: Conjunctivae normal.     Right eye: Right conjunctiva is not injected.     Left eye: Left conjunctiva is not injected.  Neck:     Trachea: Trachea and phonation normal.  Cardiovascular:     Rate and Rhythm: Normal rate and regular rhythm.     Pulses: Normal pulses.     Heart sounds: Normal heart sounds. No murmur heard.    No friction rub. No gallop.  Pulmonary:     Effort: Pulmonary effort is normal. No accessory muscle usage, prolonged expiration or respiratory distress.     Breath sounds: Normal breath sounds. No stridor, decreased air movement or transmitted upper airway sounds. No decreased breath sounds, wheezing, rhonchi or rales.  Chest:     Chest wall: No tenderness.  Musculoskeletal:        General: Normal range of motion.     Cervical back: Normal range of motion and neck supple. Normal range of motion.  Lymphadenopathy:     Cervical: No cervical adenopathy.  Skin:    General: Skin is warm and dry.     Findings: No erythema or rash.  Neurological:     General: No focal deficit present.     Mental Status: She is alert and oriented to person, place, and time.  Psychiatric:        Mood and Affect: Mood normal.        Behavior: Behavior normal.     Visual Acuity Right Eye Distance:   Left Eye Distance:   Bilateral Distance:    Right Eye Near:   Left Eye Near:    Bilateral Near:     UC Couse / Diagnostics / Procedures:     Radiology No results found.  Procedures Procedures (including critical care time) EKG  Pending results:  Labs Reviewed  POCT INFLUENZA A/B  POC SARS CORONAVIRUS 2 AG -  ED    Medications Ordered in UC: Medications - No data to display  UC  Diagnoses / Final Clinical Impressions(s)   I have reviewed the triage vital signs and the nursing notes.  Pertinent labs & imaging results that were available during my care of the patient were reviewed by me and considered in my medical decision making (see chart for details).    Final diagnoses:  Viral illness   Rapid flu and COVID-19 test were both negative.  Patient advised to retest for COVID-19 in 3 days if not feeling better.  Supportive medications discussed.  Conservative care recommended.  Return precautions advised.  Please see discharge instructions below for further details of plan of care as provided to patient. ED Prescriptions   None    PDMP not reviewed this encounter.  Disposition Upon Discharge:  Condition: stable for discharge home Home: take medications as prescribed; routine discharge instructions as discussed; follow up as advised.  Patient presented with an acute illness with associated systemic symptoms and significant discomfort requiring urgent management. In my opinion, this is a condition that a prudent lay person (someone who possesses an average knowledge of health and medicine) may potentially expect to result in complications if not addressed urgently such as respiratory distress, impairment of bodily function or dysfunction of bodily organs.   Routine symptom specific, illness specific and/or disease specific instructions were discussed with the patient and/or caregiver at length.   As such, the patient has been evaluated and assessed, work-up was  performed and treatment was provided in alignment with urgent care protocols and evidence based medicine.  Patient/parent/caregiver has been advised that the patient may require follow up for further testing and treatment if the symptoms continue in spite of treatment, as clinically indicated and appropriate.  If the patient was tested for COVID-19, Influenza and/or RSV, then the patient/parent/guardian was  advised to isolate at home pending the results of his/her diagnostic coronavirus test and potentially longer if they're positive. I have also advised pt that if his/her COVID-19 test returns positive, it's recommended to self-isolate for at least 10 days after symptoms first appeared AND until fever-free for 24 hours without fever reducer AND other symptoms have improved or resolved. Discussed self-isolation recommendations as well as instructions for household member/close contacts as per the [facility] and [place] [facility], and also gave patient the [place] packet with this information.  Patient/parent/caregiver has been advised to return to the [facility] or PCP in 3-5 days if no better; to PCP or the Emergency Department if new signs and symptoms develop, or if the current signs or symptoms continue to change or worsen for further workup, evaluation and treatment as clinically indicated and appropriate  The patient will follow up with their current PCP if and as advised. If the patient does not currently have a PCP we will assist them in obtaining one.   The patient may need specialty follow up if the symptoms continue, in spite of conservative treatment and management, for further workup, evaluation, consultation and treatment as clinically indicated and appropriate.  Patient/parent/caregiver verbalized understanding and agreement of plan as discussed.  All questions were addressed during visit.  Please see discharge instructions below for further details of plan.  Discharge Instructions:   Discharge Instructions      Your rapid influenza antigen test today was negative.  No further influenza testing is indicated.     Your COVID-19 PCR test is negative.   Please consider retesting in the next 2 to 3 days, particularly if you are not feeling any better.  You are welcome to return here to urgent care to have it done or you can take a home COVID-19 test.    If both your COVID-19 tests are negative, then you can safely  assume that your illness is due to one of the many less serious illnesses circulating in our community right now.     Conservative care is recommended with rest, drinking plenty of clear fluids, eating only when hungry, taking supportive medications for your symptoms and avoiding being around other people.  Please remain at home until you are fever free for 24 hours without the use of antifever medications such as Tylenol and ibuprofen.   Based on my physical exam findings and the history you have provided  today, I do not recommend antibiotics at this time.  I do not believe the risks and side effects of antibiotics would outweigh any minimal benefit that they might provide.       Please read below to learn more about the medications, dosages and frequencies that I recommend to help alleviate your symptoms and to get you feeling better soon:   Tylenol (acetaminophen): This is a good fever reducer.  If your body temperature rises above 101.5 as measured with a thermometer, it is recommended that you take 1,000 mg every 8 hours until your temperature falls below 101.5, please not take more than 3,000 mg of acetaminophen either as a separate medication or as in ingredient in an over-the-counter  cold/flu preparation within a 24-hour period.      Imodium (loperamide): This is a good antidiarrheal medication that you can take after every episode of diarrhea to help slow your bowels and reduce the volume as well as the frequency of diarrhea.  You take this medication up to 4 times daily.   Please follow-up within the next 5-7 days either with your primary care provider or urgent care if your symptoms do not resolve.  If you do not have a primary care provider, we will assist you in finding one.        Thank you for visiting urgent care today.  We appreciate the opportunity to participate in your care.       This office note has been dictated using Museum/gallery curator.  Unfortunately,  this method of dictation can sometimes lead to typographical or grammatical errors.  I apologize for your inconvenience in advance if this occurs.  Please do not hesitate to reach out to me if clarification is needed.      Lynden Oxford Scales, PA-C 08/10/22 1616

## 2022-08-16 ENCOUNTER — Encounter (HOSPITAL_COMMUNITY): Payer: Self-pay

## 2022-08-16 ENCOUNTER — Ambulatory Visit (HOSPITAL_COMMUNITY)
Admission: EM | Admit: 2022-08-16 | Discharge: 2022-08-16 | Disposition: A | Discharge status: Home / Self Care | Payer: Medicare Other | Attending: Internal Medicine | Admitting: Internal Medicine

## 2022-08-16 DIAGNOSIS — J0101 Acute recurrent maxillary sinusitis: Secondary | ICD-10-CM

## 2022-08-16 HISTORY — DX: Essential (primary) hypertension: I10

## 2022-08-16 MED ORDER — AMOXICILLIN-POT CLAVULANATE 875-125 MG PO TABS: 1.0000 | ORAL_TABLET | Freq: Two times a day (BID) | ORAL | 0 refills | Status: DC

## 2022-08-16 MED ORDER — FLUTICASONE PROPIONATE 50 MCG/ACT NA SUSP: 2.0000 | Freq: Every day | NASAL | 0 refills | Status: DC

## 2022-08-16 MED ORDER — PREDNISONE 20 MG PO TABS: 20.0000 mg | ORAL_TABLET | Freq: Every day | ORAL | 0 refills | Status: DC

## 2022-08-16 NOTE — ED Triage Notes (Signed)
Patient states she has been having watery eyes, nasal congestion, and hoarseness x 2 weeks. Patient states she went to [facility] and states that she had a negative Covid and flu tests approx a week ago.  Patient states she has been taking Tylenol, Aspirin, and Ibuprofen. Patient states she had Ibuprofen at 1500 today.

## 2022-08-16 NOTE — ED Provider Notes (Signed)
[place]    CSN: [id number] Arrival date & time: 08/16/22  1759      History   Chief Complaint Chief Complaint  Patient presents with   Nasal Congestion   watery eyes   Hoarse    HPI [name] [name] is a 82 y.o. female who presents  with hx of nose congestion, hoseness and watery eyes x 2 weeks. Had negative Covid and Flu test one week ago. Has been taking Tylenol, ASA, Ibuprofen for her chronic pain. Has hx of recurrent sinus problems q  3 months. Has seen ENT who scoped her nose and had negative findings, but did not have sinus CT.     Past Medical History:  Diagnosis Date   COVID 10/25/2020   Dog bite of thigh 04/09/2012   Elevated lipids    GERD (gastroesophageal reflux disease)    Hypertension    Hypothyroidism    post thyroidectomy   Osteoarthritis    Osteopenia    t -2.5 hip in 2005 by Dexa   Osteoporosis    commpr fx after dog related fall  injury   Seasonal rhinitis    spring and fall   Skin cancer    squamous cell ca chest   Vaginal prolapse    hs corrected    Patient Active Problem List   Diagnosis Date Noted   Chronic cough 07/14/2022   Hoarseness of voice 07/14/2022   GERD (gastroesophageal reflux disease) 07/14/2022   Medication management 10/16/2013   Medicare annual wellness visit, subsequent 07/16/2012   Heart palpitations 05-10-2011   Family history of sudden death 2011-05-10   PALPITATIONS 03/31/2010   BACK PAIN, LUMBAR 10/13/2009   HEARTBURN 10/13/2009   FOOT PAIN 03/31/2008   Hypothyroidism 01/25/2007   Osteoporosis 01/25/2007   OSTEOPENIA 01/25/2007    Past Surgical History:  Procedure Laterality Date   ABDOMINAL HYSTERECTOMY  07/2007   bladders sling surgery  07/2007   COLONOSCOPY  2006   THYROIDECTOMY      OB History     Gravida  1   Para  1   Term  1   Preterm      AB      Living  1      SAB      IAB      Ectopic      Multiple      Live Births               Home Medications     Prior to Admission medications   Medication Sig Start Date End Date Taking? Authorizing Provider  amoxicillin-clavulanate (AUGMENTIN) 875-125 MG tablet Take 1 tablet by mouth every 12 (twelve) hours. 08/16/22  Yes Rodriguez-Southworth, Sunday Spillers, PA-C  fluticasone (FLONASE) 50 MCG/ACT nasal spray Place 2 sprays into both nostrils daily. 08/16/22  Yes Rodriguez-Southworth, Sunday Spillers, PA-C  predniSONE (DELTASONE) 20 MG tablet Take 1 tablet (20 mg total) by mouth daily with breakfast. 08/16/22  Yes Rodriguez-Southworth, Sunday Spillers, PA-C  amLODipine (NORVASC) 5 MG tablet Take 1 tablet (5 mg total) by mouth daily. 05/13/22   Janina Mayo, MD  aspirin 325 MG EC tablet Take 325 mg by mouth every 6 (six) hours as needed for pain.    [provider]  azelastine (ASTELIN) 0.1 % nasal spray Place 1 spray into both nostrils 2 (two) times daily. 03/15/22   [provider]  b complex vitamins tablet Take 1 tablet by mouth daily.    [provider]  CALCIUM PO Take  1,200 mg by mouth daily.     [provider]  denosumab (PROLIA) 60 MG/ML SOLN injection Inject 60 mg into the skin every 6 (six) months. Administer in upper arm, thigh, or abdomen    [provider]  famotidine (PEPCID) 40 MG tablet Take 1 tablet (40 mg total) by mouth every evening. 07/14/22   Laurey Morale, MD  levothyroxine (SYNTHROID) 100 MCG tablet TAKE 1 TABLET(100 MCG) BY MOUTH DAILY 08/07/22   Dutch Quint B, FNP  pantoprazole (PROTONIX) 40 MG tablet Take 1 tablet (40 mg total) by mouth in the morning. 07/14/22   Laurey Morale, MD  Spacer/Aero-Holding Josiah Lobo (EASIVENT) inhaler See admin instructions. 04/22/22 04/22/23  [provider]    Family History Family History  Problem Relation Age of Onset   Heart attack Mother    Alcohol abuse Mother    Kidney cancer Father    Hypertension Maternal Grandmother    Esophageal cancer Neg Hx    Colon cancer Neg Hx    Pancreatic cancer Neg Hx     Stomach cancer Neg Hx     Social History Social History   Tobacco Use   Smoking status: Never   Smokeless tobacco: Never  Vaping Use   Vaping Use: Never used  Substance Use Topics   Alcohol use: Yes    Comment: occasionally   Drug use: No     Allergies   Capsicum annuum extract & derivative (bell pepper) [capsicum], Milk-related compounds, Onion, and Other   Review of Systems Review of Systems  Constitutional:  Positive for fatigue. Negative for chills, diaphoresis and fever.  HENT:  Positive for postnasal drip, sinus pressure and sinus pain. Negative for ear discharge and ear pain.   Respiratory:  Negative for cough and wheezing.   Musculoskeletal:  Negative for myalgias.     Physical Exam Triage Vital Signs ED Triage Vitals [08/16/22 1831]  Enc Vitals Group     BP (!) 128/100     Pulse Rate 97     Resp 16     Temp 98.2 F (36.8 C)     Temp Source Oral     SpO2 94 %     Weight      Height      Head Circumference      Peak Flow      Pain Score 0     Pain Loc      Pain Edu?      Excl. in [place]?    No data found.  Updated Vital Signs BP (!) 128/100 (BP Location: Left Arm)   Pulse 97   Temp 98.2 F (36.8 C) (Oral)   Resp 16   SpO2 94%   Visual Acuity Right Eye Distance:   Left Eye Distance:   Bilateral Distance:    Right Eye Near:   Left Eye Near:    Bilateral Near:     Physical Exam Vitals and nursing note reviewed.  Constitutional:      General: She is not in acute distress.    Appearance: She is normal weight. She is not toxic-appearing.  HENT:     Right Ear: Tympanic membrane, ear canal and external ear normal.     Left Ear: Tympanic membrane, ear canal and external ear normal.     Nose: Congestion present.     Comments: Has green mucous on L nostril. Has bilateral maxillary tenderness    Mouth/Throat:     Mouth: Mucous membranes are moist.  Pharynx: Oropharynx is clear.  Eyes:     General: No scleral icterus. Pulmonary:      Effort: Pulmonary effort is normal.     Comments: Has faint wheeze on L upper lung, but posteriorly her lungs are clear.  Musculoskeletal:        General: Normal range of motion.     Cervical back: Neck supple.  Neurological:     Mental Status: She is alert and oriented to person, place, and time.     Gait: Gait normal.  Psychiatric:        Mood and Affect: Mood normal.        Behavior: Behavior normal.        Thought Content: Thought content normal.      UC Treatments / Results  Labs (all labs ordered are listed, but only abnormal results are displayed) Labs Reviewed - No data to display  EKG   Radiology No results found.  Procedures Procedures (including critical care time)  Medications Ordered in UC Medications - No data to display  Initial Impression / Assessment and Plan / UC Course  I have reviewed the triage vital signs and the nursing notes.  Recurrent maxillary sinusitis  Pt advised to do saline nose rinses bid and use Flonase while on the Augmentin as noted, which is the antibiotic she is prescribed by her ENT. Then advised to do the saline rinses and Flonase as soon as she feels the nose congestion and post nasal drainage come back.  I also placed her on a few days of prednisone as noted.   Final Clinical Impressions(s) / UC Diagnoses   Final diagnoses:  Acute recurrent maxillary sinusitis   Discharge Instructions   None    ED Prescriptions     Medication Sig Dispense Auth. Provider   fluticasone (FLONASE) 50 MCG/ACT nasal spray Place 2 sprays into both nostrils daily. 18.2 g Rodriguez-Southworth, Sunday Spillers, PA-C   predniSONE (DELTASONE) 20 MG tablet Take 1 tablet (20 mg total) by mouth daily with breakfast. 5 tablet Rodriguez-Southworth, Keren Alverio, PA-C   amoxicillin-clavulanate (AUGMENTIN) 875-125 MG tablet Take 1 tablet by mouth every 12 (twelve) hours. 42 tablet Rodriguez-Southworth, Sunday Spillers, PA-C      PDMP not reviewed this encounter.    Shelby Mattocks, [place] 08/16/22 1859

## 2022-08-17 ENCOUNTER — Ambulatory Visit: Payer: Medicare Other

## 2022-08-17 NOTE — Progress Notes (Deleted)
Office Visit    Patient Name: [name] [name] Date of Encounter: 08/17/2022  Primary Care Provider:  Burnis Medin, MD Primary Cardiologist:  None  Chief Complaint    Hypertension  Significant Past Medical History   HLD   sinusitis   osteoporosis On Prolia q93m         Allergies  Allergen Reactions   Capsicum Annuum Extract & Derivative (Bell Pepper) [Capsicum]     Other reaction(s): Other (See Comments)   Milk-Related Compounds Other (See Comments)   Onion Other (See Comments)   Other Other (See Comments)    Green Peppers    History of Present Illness    [name] Desalvois a 82y.o. female patient of Dr MPhineas Inches in the office to day for hypertension follow up.  She has been seen multiple times over the past year by CKarren CobblePharmD.  Her most recent visit was in November, at which time her pressure was improved to 132/71.  She was having some allergy/sinus problems at that time, and she has been to the ED twice this month.  Was negative for flu and COVID, but yesterday was started on Augmentin and prednisone (20 mg qd x 5 d).  Blood Pressure Goal:  130/80  Current Medications:  amlodipine 5 mg qd  Previously tried:    Family Hx:     Social Hx:      Tobacco: no  Alcohol: occasional  Caffeine:  Diet:      Exercise:   Home BP readings:      Adherence Assessment  Do you ever forget to take your medication? []$ Yes []$ No  Do you ever skip doses due to side effects? []$ Yes []$ No  Do you have trouble affording your medicines? []$ Yes []$ No  Are you ever unable to pick up your medication due to transportation difficulties? []$ Yes []$ No  Do you ever stop taking your medications because you don't believe they are helping? []$ Yes []$ No  Do you check your weight daily? []$ Yes []$ No   Adherence strategy: ***  Barriers to obtaining medications: ***     Accessory Clinical Findings    Lab Results  Component Value Date   CREATININE 0.54 07/20/2022   BUN 15  07/20/2022   NA 141 07/20/2022   K 3.6 07/20/2022   CL 105 07/20/2022   CO2 29 07/20/2022   Lab Results  Component Value Date   ALT 21 07/20/2022   AST 28 07/20/2022   ALKPHOS 28 (L) 07/20/2022   BILITOT 1.6 (H) 07/20/2022   Lab Results  Component Value Date   HGBA1C 5.8 09/23/2020    Home Medications    Current Outpatient Medications  Medication Sig Dispense Refill   amLODipine (NORVASC) 5 MG tablet Take 1 tablet (5 mg total) by mouth daily. 90 tablet 3   amoxicillin-clavulanate (AUGMENTIN) 875-125 MG tablet Take 1 tablet by mouth every 12 (twelve) hours. 42 tablet 0   aspirin 325 MG EC tablet Take 325 mg by mouth every 6 (six) hours as needed for pain.     azelastine (ASTELIN) 0.1 % nasal spray Place 1 spray into both nostrils 2 (two) times daily.     b complex vitamins tablet Take 1 tablet by mouth daily.     CALCIUM PO Take 1,200 mg by mouth daily.      denosumab (PROLIA) 60 MG/ML SOLN injection Inject 60 mg into the skin every 6 (six) months. Administer in upper arm, thigh, or abdomen     famotidine (  PEPCID) 40 MG tablet Take 1 tablet (40 mg total) by mouth every evening. 30 tablet 0   fluticasone (FLONASE) 50 MCG/ACT nasal spray Place 2 sprays into both nostrils daily. 18.2 g 0   levothyroxine (SYNTHROID) 100 MCG tablet TAKE 1 TABLET(100 MCG) BY MOUTH DAILY 90 tablet 2   pantoprazole (PROTONIX) 40 MG tablet Take 1 tablet (40 mg total) by mouth in the morning. 30 tablet 0   predniSONE (DELTASONE) 20 MG tablet Take 1 tablet (20 mg total) by mouth daily with breakfast. 5 tablet 0   Spacer/Aero-Holding Chambers (EASIVENT) inhaler See admin instructions.     No current facility-administered medications for this visit.         Assessment & Plan    No problem-specific Assessment & Plan notes found for this encounter.   Tommy Medal PharmD CPP [place]  [address] [place], [place] [address] [phone]

## 2022-08-25 DIAGNOSIS — Z1231 Encounter for screening mammogram for malignant neoplasm of breast: Secondary | ICD-10-CM | POA: Diagnosis not present

## 2022-08-25 LAB — HM MAMMOGRAPHY

## 2022-08-30 DIAGNOSIS — R490 Dysphonia: Secondary | ICD-10-CM | POA: Diagnosis not present

## 2022-08-30 DIAGNOSIS — J383 Other diseases of vocal cords: Secondary | ICD-10-CM | POA: Diagnosis not present

## 2022-08-30 DIAGNOSIS — J370 Chronic laryngitis: Secondary | ICD-10-CM | POA: Diagnosis not present

## 2022-09-03 ENCOUNTER — Other Ambulatory Visit: Payer: Self-pay | Admitting: Internal Medicine

## 2022-09-05 DIAGNOSIS — R293 Abnormal posture: Secondary | ICD-10-CM | POA: Diagnosis not present

## 2022-09-13 ENCOUNTER — Encounter: Payer: Self-pay | Admitting: Emergency Medicine

## 2022-09-13 ENCOUNTER — Ambulatory Visit
Admission: EM | Admit: 2022-09-13 | Discharge: 2022-09-13 | Disposition: A | Discharge status: Home / Self Care | Payer: Medicare Other | Attending: Family Medicine | Admitting: Family Medicine

## 2022-09-13 DIAGNOSIS — J309 Allergic rhinitis, unspecified: Secondary | ICD-10-CM

## 2022-09-13 DIAGNOSIS — J029 Acute pharyngitis, unspecified: Secondary | ICD-10-CM

## 2022-09-13 DIAGNOSIS — J0100 Acute maxillary sinusitis, unspecified: Secondary | ICD-10-CM

## 2022-09-13 MED ORDER — FEXOFENADINE HCL 60 MG PO TABS: 60.0000 mg | ORAL_TABLET | Freq: Every day | ORAL | 0 refills | Status: DC

## 2022-09-13 MED ORDER — CLARITHROMYCIN 500 MG PO TABS: 500.0000 mg | ORAL_TABLET | Freq: Two times a day (BID) | ORAL | 0 refills | Status: AC

## 2022-09-13 NOTE — Discharge Instructions (Addendum)
Instructed patient to take medications as directed with food to completion.  Advised patient to take Allegra with first dose of Biaxin for the next 7 days.  Advised may use as needed for postnasal drainage/drip afterwards.  Encouraged patient to increase daily water intake to 64 ounces per day while taking these medications.  Advised if symptoms worsen and/or unresolved please follow-up with PCP or here for further evaluation.

## 2022-09-13 NOTE — ED Triage Notes (Signed)
Per pt, "congestion, fatigue, laryngitis x 5-6 days"  Using flonase and took 1/2 allegra today  Sinus drainage  Denies fever

## 2022-09-13 NOTE — ED Provider Notes (Signed)
[name]    CSN: [id number] Arrival date & time: 09/13/22  1701      History   Chief Complaint Chief Complaint  Patient presents with   Facial Pain    HPI [name] [name] is a 82 y.o. female.   HPI 81 year old female presents with congestion, sinus drainage, fatigue, laryngitis for 5 to 6 days.  Patient was treated for acute recurrent maxillary sinusitis on 08/16/2022 patient reports using OTC Flonase and Allegra.  PMH significant for chronic cough, HTN, hypothyroidism, and osteopenia.  Past Medical History:  Diagnosis Date   COVID 10/25/2020   Dog bite of thigh 04/09/2012   Elevated lipids    GERD (gastroesophageal reflux disease)    Hypertension    Hypothyroidism    post thyroidectomy   Osteoarthritis    Osteopenia    t -2.5 hip in 2005 by Dexa   Osteoporosis    commpr fx after dog related fall  injury   Seasonal rhinitis    spring and fall   Skin cancer    squamous cell ca chest   Vaginal prolapse    hs corrected    Patient Active Problem List   Diagnosis Date Noted   Chronic cough 07/14/2022   Hoarseness of voice 07/14/2022   GERD (gastroesophageal reflux disease) 07/14/2022   Medication management 10/16/2013   Medicare annual wellness visit, subsequent 07/16/2012   Heart palpitations May 21, 2011   Family history of sudden death 2011-05-21   PALPITATIONS 03/31/2010   BACK PAIN, LUMBAR 10/13/2009   HEARTBURN 10/13/2009   FOOT PAIN 03/31/2008   Hypothyroidism 01/25/2007   Osteoporosis 01/25/2007   OSTEOPENIA 01/25/2007    Past Surgical History:  Procedure Laterality Date   ABDOMINAL HYSTERECTOMY  07/2007   bladders sling surgery  07/2007   COLONOSCOPY  2006   THYROIDECTOMY      OB History     Gravida  1   Para  1   Term  1   Preterm      AB      Living  1      SAB      IAB      Ectopic      Multiple      Live Births               Home Medications    Prior to Admission medications   Medication Sig Start  Date End Date Taking? Authorizing Provider  clarithromycin (BIAXIN) 500 MG tablet Take 1 tablet (500 mg total) by mouth 2 (two) times daily for 7 days. 09/13/22 09/20/22 Yes Eliezer Lofts, FNP  fexofenadine (ALLEGRA) 60 MG tablet Take 1 tablet (60 mg total) by mouth daily for 15 days. 09/13/22 09/28/22 Yes Eliezer Lofts, FNP  amLODipine (NORVASC) 5 MG tablet Take 1 tablet (5 mg total) by mouth daily. 05/13/22   Janina Mayo, MD  aspirin 325 MG EC tablet Take 325 mg by mouth every 6 (six) hours as needed for pain.    [provider]  azelastine (ASTELIN) 0.1 % nasal spray Place 1 spray into both nostrils 2 (two) times daily. 03/15/22   [provider]  b complex vitamins tablet Take 1 tablet by mouth daily.    [provider]  CALCIUM PO Take 1,200 mg by mouth daily.     [provider]  denosumab (PROLIA) 60 MG/ML SOLN injection Inject 60 mg into the skin every 6 (six) months. Administer in upper arm, thigh, or abdomen  [provider]  famotidine (PEPCID) 40 MG tablet Take 1 tablet (40 mg total) by mouth every evening. 07/14/22   Laurey Morale, MD  fluticasone (FLONASE) 50 MCG/ACT nasal spray Place 2 sprays into both nostrils daily. 08/16/22   Rodriguez-Southworth, Sunday Spillers, PA-C  levothyroxine (SYNTHROID) 100 MCG tablet TAKE 1 TABLET(100 MCG) BY MOUTH DAILY 08/07/22   Dutch Quint B, FNP  pantoprazole (PROTONIX) 40 MG tablet Take 1 tablet (40 mg total) by mouth in the morning. 07/14/22   Laurey Morale, MD  Spacer/Aero-Holding Josiah Lobo (EASIVENT) inhaler See admin instructions. 04/22/22 04/22/23  [provider]    Family History Family History  Problem Relation Age of Onset   Heart attack Mother    Alcohol abuse Mother    Kidney cancer Father    Hypertension Maternal Grandmother    Esophageal cancer Neg Hx    Colon cancer Neg Hx    Pancreatic cancer Neg Hx    Stomach cancer Neg Hx     Social History Social History   Tobacco Use    Smoking status: Never   Smokeless tobacco: Never  Vaping Use   Vaping Use: Never used  Substance Use Topics   Alcohol use: Yes    Comment: occasionally   Drug use: No     Allergies   Capsicum annuum extract & derivative (bell pepper) [capsicum], Milk-related compounds, Onion, and Other   Review of Systems Review of Systems  HENT:  Positive for congestion, rhinorrhea, sinus pressure, sinus pain, sore throat and voice change.   All other systems reviewed and are negative.    Physical Exam Triage Vital Signs ED Triage Vitals  Enc Vitals Group     BP      Pulse      Resp      Temp      Temp src      SpO2      Weight      Height      Head Circumference      Peak Flow      Pain Score      Pain Loc      Pain Edu?      Excl. in [place]?    No data found.  Updated Vital Signs BP 123/77 (BP Location: Left Arm)   Pulse 96   Temp 98.4 F (36.9 C) (Oral)   Resp 16   Ht 5' (1.524 m)   Wt 128 lb (58.1 kg)   SpO2 96%   BMI 25.00 kg/m   Physical Exam Vitals and nursing note reviewed.  Constitutional:      Appearance: Normal appearance. She is normal weight. She is ill-appearing.  HENT:     Head: Normocephalic and atraumatic.     Right Ear: Tympanic membrane and external ear normal.     Left Ear: Tympanic membrane and external ear normal.     Ears:     Comments: Significant eustachian tube dysfunction noted bilaterally    Nose:     Right Sinus: Maxillary sinus tenderness present.     Left Sinus: Maxillary sinus tenderness present.     Comments: Turbinates, erythematous/edematous with trace clear rhinorrhea noted    Mouth/Throat:     Mouth: Mucous membranes are moist.     Pharynx: Oropharynx is clear. Uvula midline. Posterior oropharyngeal erythema and uvula swelling present.     Comments: Moderate amount of clear drainage of posterior oropharynx noted. Eyes:     Extraocular Movements: Extraocular movements intact.  Conjunctiva/sclera: Conjunctivae normal.      Pupils: Pupils are equal, round, and reactive to light.  Neurological:     Mental Status: She is alert.      UC Treatments / Results  Labs (all labs ordered are listed, but only abnormal results are displayed) Labs Reviewed - No data to display  EKG   Radiology No results found.  Procedures Procedures (including critical care time)  Medications Ordered in UC Medications - No data to display  Initial Impression / Assessment and Plan / UC Course  I have reviewed the triage vital signs and the nursing notes.  Pertinent labs & imaging results that were available during my care of the patient were reviewed by me and considered in my medical decision making (see chart for details).     MDM: 1.  Acute maxillary sinusitis, recurrence not specified-Rx'd Biaxin 500 mg twice daily x 7 days; 2.  Acute pharyngitis, unspecified etiology-same as 1.  Rx'd Biaxin 500 mg twice daily x 7 days. Instructed patient to take medications as directed with food to completion.  Advised patient to take Allegra with first dose of Biaxin for the next 7 days.  3.  Allergic rhinitis -Rx'd Allegra 60 mg daily x 7 days, then as neededadvised may use as needed for postnasal drainage/drip afterwards.  Encouraged patient to increase daily water intake to 64 ounces per day while taking these medications.  Advised if symptoms worsen and/or unresolved please follow-up with PCP or here for further evaluation. Final Clinical Impressions(s) / UC Diagnoses   Final diagnoses:  Acute maxillary sinusitis, recurrence not specified  Acute pharyngitis, unspecified etiology  Allergic rhinitis, unspecified seasonality, unspecified trigger     Discharge Instructions      Instructed patient to take medications as directed with food to completion.  Advised patient to take Allegra with first dose of Biaxin for the next 7 days.  Advised may use as needed for postnasal drainage/drip afterwards.  Encouraged patient to increase  daily water intake to 64 ounces per day while taking these medications.  Advised if symptoms worsen and/or unresolved please follow-up with PCP or here for further evaluation.     ED Prescriptions     Medication Sig Dispense Auth. Provider   clarithromycin (BIAXIN) 500 MG tablet Take 1 tablet (500 mg total) by mouth 2 (two) times daily for 7 days. 14 tablet Eliezer Lofts, FNP   fexofenadine (ALLEGRA) 60 MG tablet Take 1 tablet (60 mg total) by mouth daily for 15 days. 15 tablet Eliezer Lofts, FNP      PDMP not reviewed this encounter.   Eliezer Lofts, [place] 09/13/22 [phone]

## 2022-09-16 ENCOUNTER — Ambulatory Visit: Payer: Medicare Other | Attending: Cardiology | Admitting: Student

## 2022-09-16 ENCOUNTER — Encounter: Payer: Self-pay | Admitting: Student

## 2022-09-16 VITALS — BP 105/66 | HR 70

## 2022-09-16 DIAGNOSIS — I10 Essential (primary) hypertension: Secondary | ICD-10-CM | POA: Insufficient documentation

## 2022-09-16 NOTE — Patient Instructions (Signed)
No changes made by your pharmacist Cammy Copa, PharmD at today's visit: Continue taking amlodipine 5 mg daily.       HOW TO TAKE YOUR BLOOD PRESSURE AT HOME  Rest 5 minutes before taking your blood pressure.  Don't smoke or drink caffeinated beverages for at least 30 minutes before. Take your blood pressure before (not after) you eat. Sit comfortably with your back supported and both feet on the floor (don't cross your legs). Elevate your arm to heart level on a table or a desk. Use the proper sized cuff. It should fit smoothly and snugly around your bare upper arm. There should be enough room to slip a fingertip under the cuff. The bottom edge of the cuff should be 1 inch above the crease of the elbow. Ideally, take 3 measurements at one sitting and record the average.  Important lifestyle changes to control high blood pressure  Intervention  Effect on the BP  Lose extra pounds and watch your waistline Weight loss is one of the most effective lifestyle changes for controlling blood pressure. If you're overweight or obese, losing even a small amount of weight can help reduce blood pressure. Blood pressure might go down by about 1 millimeter of mercury (mm Hg) with each kilogram (about 2.2 pounds) of weight lost.  Exercise regularly As a general goal, aim for at least 30 minutes of moderate physical activity every day. Regular physical activity can lower high blood pressure by about 5 to 8 mm Hg.  Eat a healthy diet Eating a diet rich in whole grains, fruits, vegetables, and low-fat dairy products and low in saturated fat and cholesterol. A healthy diet can lower high blood pressure by up to 11 mm Hg.  Reduce salt (sodium) in your diet Even a small reduction of sodium in the diet can improve heart health and reduce high blood pressure by about 5 to 6 mm Hg.  Limit alcohol One drink equals 12 ounces of beer, 5 ounces of wine, or 1.5 ounces of 80-proof liquor.  Limiting alcohol to less  than one drink a day for women or two drinks a day for men can help lower blood pressure by about 4 mm Hg.   If you have any questions or concerns please use My Chart to send questions or call the office at [phone]

## 2022-09-16 NOTE — Progress Notes (Signed)
Patient ID: [name] [name]                 DOB: 10-18-1940                      MRN: [id number]      HPI: [name] [name] is a 82 y.o. female referred by Dr. Harl Bowie to HTN clinic. PMH is significant for HTN and SOB.   Patient presents today in good spirits. Remains busy. Have been on antibiotics and fexofenadine for allergy.    Continues to tolerate amlodipine.  Brought home readings. Readings typically  except when ~120/75 heart rate 70-80 he was on prednisone    Exercise : stationary bike 3 times week 30 min session  Diet: low salt diet   Current HTN meds:  Amlodipine 5 mg daily  Wt Readings from Last 3 Encounters:  09/13/22 128 lb (58.1 kg)  07/20/22 137 lb 6 oz (62.3 kg)  07/14/22 136 lb 12.8 oz (62.1 kg)   BP Readings from Last 3 Encounters:  09/13/22 123/77  08/16/22 (!) 128/100  08/10/22 136/86   Pulse Readings from Last 3 Encounters:  09/13/22 96  08/16/22 97  08/10/22 90    Renal function: CrCl cannot be calculated (Patient's most recent lab result is older than the maximum 21 days allowed.).  Past Medical History:  Diagnosis Date   COVID 10/25/2020   Dog bite of thigh 04/09/2012   Elevated lipids    GERD (gastroesophageal reflux disease)    Hypertension    Hypothyroidism    post thyroidectomy   Osteoarthritis    Osteopenia    t -2.5 hip in 2005 by Dexa   Osteoporosis    commpr fx after dog related fall  injury   Seasonal rhinitis    spring and fall   Skin cancer    squamous cell ca chest   Vaginal prolapse    hs corrected    Current Outpatient Medications on File Prior to Visit  Medication Sig Dispense Refill   amLODipine (NORVASC) 5 MG tablet Take 1 tablet (5 mg total) by mouth daily. 90 tablet 3   aspirin 325 MG EC tablet Take 325 mg by mouth every 6 (six) hours as needed for pain.     azelastine (ASTELIN) 0.1 % nasal spray Place 1 spray into both nostrils 2 (two) times daily.     b complex vitamins tablet Take 1 tablet by mouth daily.      CALCIUM PO Take 1,200 mg by mouth daily.      clarithromycin (BIAXIN) 500 MG tablet Take 1 tablet (500 mg total) by mouth 2 (two) times daily for 7 days. 14 tablet 0   denosumab (PROLIA) 60 MG/ML SOLN injection Inject 60 mg into the skin every 6 (six) months. Administer in upper arm, thigh, or abdomen     famotidine (PEPCID) 40 MG tablet Take 1 tablet (40 mg total) by mouth every evening. 30 tablet 0   fexofenadine (ALLEGRA) 60 MG tablet Take 1 tablet (60 mg total) by mouth daily for 15 days. 15 tablet 0   fluticasone (FLONASE) 50 MCG/ACT nasal spray Place 2 sprays into both nostrils daily. 18.2 g 0   levothyroxine (SYNTHROID) 100 MCG tablet TAKE 1 TABLET(100 MCG) BY MOUTH DAILY 90 tablet 2   pantoprazole (PROTONIX) 40 MG tablet Take 1 tablet (40 mg total) by mouth in the morning. 30 tablet 0   Spacer/Aero-Holding Chambers (EASIVENT) inhaler See admin instructions.     No  current facility-administered medications on file prior to visit.    Allergies  Allergen Reactions   Capsicum Annuum Extract & Derivative (Bell Pepper) [Capsicum]     Other reaction(s): Other (See Comments)   Milk-Related Compounds Other (See Comments)   Onion Other (See Comments)   Other Other (See Comments)    Green Peppers     Assessment/Plan:  1. Hypertension -   Patient BP in room today 105/66 with heart rate 70 home BP ~120/75 heart rate 70-80 range.  Denies dizziness, headaches, palpitation or syncope reports she feels great Her SOB is due to her lung problem  Will continue amlodipine 5 mg daily and get patient to see Dr Harl Bowie at her regular follow up  Continue monitoring BP at home  [facility], [place] A Division of [facility] [address] [place], [place] [address]  Phone: [phone]; Fax: [phone]

## 2022-09-21 DIAGNOSIS — M533 Sacrococcygeal disorders, not elsewhere classified: Secondary | ICD-10-CM | POA: Diagnosis not present

## 2022-09-21 DIAGNOSIS — G8929 Other chronic pain: Secondary | ICD-10-CM | POA: Diagnosis not present

## 2022-10-03 DIAGNOSIS — R293 Abnormal posture: Secondary | ICD-10-CM | POA: Diagnosis not present

## 2022-10-06 DIAGNOSIS — J383 Other diseases of vocal cords: Secondary | ICD-10-CM | POA: Diagnosis not present

## 2022-10-06 DIAGNOSIS — R490 Dysphonia: Secondary | ICD-10-CM | POA: Diagnosis not present

## 2022-10-06 DIAGNOSIS — J370 Chronic laryngitis: Secondary | ICD-10-CM | POA: Diagnosis not present

## 2022-10-07 DIAGNOSIS — J329 Chronic sinusitis, unspecified: Secondary | ICD-10-CM | POA: Diagnosis not present

## 2022-10-07 DIAGNOSIS — J310 Chronic rhinitis: Secondary | ICD-10-CM | POA: Diagnosis not present

## 2022-10-07 DIAGNOSIS — J383 Other diseases of vocal cords: Secondary | ICD-10-CM | POA: Diagnosis not present

## 2022-10-07 DIAGNOSIS — R490 Dysphonia: Secondary | ICD-10-CM | POA: Diagnosis not present

## 2022-10-13 ENCOUNTER — Encounter: Payer: Self-pay | Admitting: Internal Medicine

## 2022-10-13 ENCOUNTER — Telehealth: Payer: Self-pay | Admitting: Internal Medicine

## 2022-10-13 ENCOUNTER — Ambulatory Visit (INDEPENDENT_AMBULATORY_CARE_PROVIDER_SITE_OTHER): Payer: Medicare Other | Admitting: Internal Medicine

## 2022-10-13 VITALS — BP 118/68 | HR 98 | Temp 98.1°F | Ht 60.0 in | Wt 124.6 lb

## 2022-10-13 DIAGNOSIS — R634 Abnormal weight loss: Secondary | ICD-10-CM | POA: Diagnosis not present

## 2022-10-13 DIAGNOSIS — R630 Anorexia: Secondary | ICD-10-CM | POA: Diagnosis not present

## 2022-10-13 DIAGNOSIS — R490 Dysphonia: Secondary | ICD-10-CM

## 2022-10-13 DIAGNOSIS — R1319 Other dysphagia: Secondary | ICD-10-CM

## 2022-10-13 NOTE — Telephone Encounter (Signed)
Urgent referral in [place] for hoarseness, trouble swallowing and weight loss.  Last saw Dr. Leone Payor in 2017.  Please review and advise urgency and scheduling.  Thanks

## 2022-10-13 NOTE — Patient Instructions (Addendum)
I advise we  refer to the GI team and evaluated esophagus and stomach  .  Consider  ugi testing an may be should have endoscopy . Let  us know if worsens in near future.

## 2022-10-13 NOTE — Telephone Encounter (Signed)
I was contacted by family member about this also  I have reviewed the notes in computer  I have openings May 8 (just opened today) so let's put her in there  Also go ahead and schedule barium swallow w/ tablet for dysphagia to be done before I see her  Encounter Diagnosis  Name Primary?   Esophageal dysphagia Yes

## 2022-10-13 NOTE — Progress Notes (Signed)
Chief Complaint  Patient presents with   Hoarse    Pt c/o runny nose, runny eyes, sneezing, sore throat(subsided), nausea(subsided) started on Monday. Using nasal spray. States not helping. Pt reports sx hoarseness and coughing up white phlegm last night.     HPI: [name] [name] 82 y.o. come in for ongoing  problem   seen ent  and [facility]  and then supposed to see vocal chord  and then sinus ct to do next week  for her hoarseness  Given rnew x nasal spray on Tuesday .  And then copious white mucous   increase.d without sinus pain .  Has had multipl antibiotics and eval since January  No fever systemtc illness   and loss of appetit e for months  Taste and smell about the same   Fatigue   and weight loss  Due for cologuard    but may be more change in habits more gas ans sometimes hard stools.   Some indigestion and burping with specific foods esp mildsometimes feels like food stuck or pills stuck upper chest . No vomiting  ROS: See pertinent positives and negatives per HPI.  Past Medical History:  Diagnosis Date   COVID 10/25/2020   Dog bite of thigh 04/09/2012   Elevated lipids    GERD (gastroesophageal reflux disease)    Hypertension    Hypothyroidism    post thyroidectomy   Osteoarthritis    Osteopenia    t -2.5 hip in 2005 by Dexa   Osteoporosis    commpr fx after dog related fall  injury   Seasonal rhinitis    spring and fall   Skin cancer    squamous cell ca chest   Vaginal prolapse    hs corrected    Family History  Problem Relation Age of Onset   Heart attack Mother    Alcohol abuse Mother    Kidney cancer Father    Hypertension Maternal Grandmother    Esophageal cancer Neg Hx    Colon cancer Neg Hx    Pancreatic cancer Neg Hx    Stomach cancer Neg Hx     Social History   Socioeconomic History   Marital status: Widowed    Spouse name: Not on file   Number of children: Not on file   Years of education: Not on file   Highest education level: Associate  degree: academic program  Occupational History   Not on file  Tobacco Use   Smoking status: Never   Smokeless tobacco: Never  Vaping Use   Vaping Use: Never used  Substance and Sexual Activity   Alcohol use: Yes    Comment: occasionally   Drug use: No   Sexual activity: Not Currently    Partners: Male    Birth control/protection: Surgical    Comment: hysterectomy, older than 16, less than 5  Other Topics Concern   Not on file  Social History Narrative   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   Regular Exercise- yes   Now on rx   Working  Full time        hh of 2 cats and dogs in hh    Social Determinants of Health   Financial Resource Strain: Low Risk  (05/24/2022)   Overall Financial Resource Strain (CARDIA)    Difficulty of Paying Living Expenses: Not hard at all  Food Insecurity: No Food Insecurity (05/24/2022)   Hunger Vital Sign    Worried About Running Out of Food in the  Last Year: Never true    Ran Out of Food in the Last Year: Never true  Transportation Needs: No Transportation Needs (05/24/2022)   PRAPARE - Administrator, Civil Service (Medical): No    Lack of Transportation (Non-Medical): No  Physical Activity: Insufficiently Active (05/24/2022)   Exercise Vital Sign    Days of Exercise per Week: 5 days    Minutes of Exercise per Session: 20 min  Stress: No Stress Concern Present (05/24/2022)   [employer] of Occupational Health - Occupational Stress Questionnaire    Feeling of Stress : Not at all  Social Connections: Moderately Integrated (05/24/2022)   Social Connection and Isolation Panel [NHANES]    Frequency of Communication with Friends and Family: More than three times a week    Frequency of Social Gatherings with Friends and Family: Once a week    Attends Religious Services: More than 4 times per year    Active Member of [employer] or Organizations: Yes    Attends Banker Meetings: More than 4 times per year    Marital  Status: Widowed    Outpatient Medications Prior to Visit  Medication Sig Dispense Refill   amLODipine (NORVASC) 5 MG tablet Take 1 tablet (5 mg total) by mouth daily. 90 tablet 3   aspirin 325 MG EC tablet Take 325 mg by mouth every 6 (six) hours as needed for pain.     b complex vitamins tablet Take 1 tablet by mouth daily.     CALCIUM PO Take 1,200 mg by mouth daily.      denosumab (PROLIA) 60 MG/ML SOLN injection Inject 60 mg into the skin every 6 (six) months. Administer in upper arm, thigh, or abdomen     famotidine (PEPCID) 40 MG tablet Take 1 tablet (40 mg total) by mouth every evening. 30 tablet 0   fluticasone (FLONASE) 50 MCG/ACT nasal spray Place 2 sprays into both nostrils daily. 18.2 g 0   levothyroxine (SYNTHROID) 100 MCG tablet TAKE 1 TABLET(100 MCG) BY MOUTH DAILY 90 tablet 2   pantoprazole (PROTONIX) 40 MG tablet Take 1 tablet (40 mg total) by mouth in the morning. (Patient taking differently: Take 20 mg by mouth in the morning.) 30 tablet 0   azelastine (ASTELIN) 0.1 % nasal spray Place 1 spray into both nostrils 2 (two) times daily. (Patient not taking: Reported on 10/13/2022)     fexofenadine (ALLEGRA) 60 MG tablet Take 1 tablet (60 mg total) by mouth daily for 15 days. 15 tablet 0   Spacer/Aero-Holding Chambers (EASIVENT) inhaler See admin instructions. (Patient not taking: Reported on 10/13/2022)     No facility-administered medications prior to visit.     EXAM:  BP 118/68 (BP Location: Right Arm, Patient Position: Sitting, Cuff Size: Normal)   Pulse 98   Temp 98.1 F (36.7 C) (Oral)   Ht 5' (1.524 m)   Wt 124 lb 9.6 oz (56.5 kg)   SpO2 95%   BMI 24.33 kg/m   Body mass index is 24.33 kg/m. Wt Readings from Last 3 Encounters:  10/13/22 124 lb 9.6 oz (56.5 kg)  09/13/22 128 lb (58.1 kg)  07/20/22 137 lb 6 oz (62.3 kg)  11 28  23weight 142  GENERAL: vitals reviewed and listed above, alert, oriented, appears well hydrated and in no acute distress HEENT:  atraumatic, conjunctiva  clear, no obvious abnormalities on inspection of external nose and ears hoarse but no stridor    Mild congestion no edema  NECK: no obvious masses on inspection palpation  LUNGS: clear to auscultation bilaterally, no wheezes, rales or rhonchi,  kyhosis  CV: HRRR, no clubbing cyanosis or  peripheral edema nl cap refill  Abd no pb masses sor inc h s  MS: moves all extremities without noticeable focal  abnormality PSYCH: pleasant and cooperative, no obvious depression or anxiety Lab Results  Component Value Date   WBC 5.1 07/20/2022   HGB 13.3 07/20/2022   HCT 38.9 07/20/2022   PLT 294.0 07/20/2022   GLUCOSE 91 07/20/2022   CHOL 230 (H) 07/20/2022   TRIG 121.0 07/20/2022   HDL 68.10 07/20/2022   LDLDIRECT 134.1 12/27/2006   LDLCALC 138 (H) 07/20/2022   ALT 21 07/20/2022   AST 28 07/20/2022   NA 141 07/20/2022   K 3.6 07/20/2022   CL 105 07/20/2022   CREATININE 0.54 07/20/2022   BUN 15 07/20/2022   CO2 29 07/20/2022   TSH 3.19 07/20/2022   INR 1.0 07/25/2007   HGBA1C 5.8 09/23/2020   BP Readings from Last 3 Encounters:  10/13/22 118/68  09/16/22 105/66  09/13/22 123/77    ASSESSMENT AND PLAN:  Discussed the following assessment and plan:  Weight loss - with  esophageal dysfunction sx - Plan: Ambulatory referral to Gastroenterology  Hoarseness of voice - Plan: Ambulatory referral to Gastroenterology  Loss of appetite Significant weight loss with dec appetite and ocass esophageal gi sx upper   ? If related to hoarseness evaluation  so far .  Is on protonix and taking pepto bismol 3 x per week  and ocass asa .  Asap gi eval  poss need for endo with without esophogram  swallowing    -Patient advised to return or notify health care team  if  new concerns arise.  Patient Instructions  I advise we  refer to the GI team and evaluated esophagus and stomach  .  Consider  ugi testing an may be should have endoscopy . Let  us know if worsens in near  future.   Neta Mends. Trea Carnegie M.D.

## 2022-10-14 ENCOUNTER — Other Ambulatory Visit: Payer: Self-pay

## 2022-10-14 ENCOUNTER — Encounter: Payer: Self-pay | Admitting: Internal Medicine

## 2022-10-14 DIAGNOSIS — R1319 Other dysphagia: Secondary | ICD-10-CM

## 2022-10-14 NOTE — Telephone Encounter (Signed)
The pt has been scheduled for office appt for May 8 with Dr Leone Payor.  Barium swallow has been ordered and sent to the schedulers to call pt

## 2022-10-17 DIAGNOSIS — J019 Acute sinusitis, unspecified: Secondary | ICD-10-CM | POA: Diagnosis not present

## 2022-10-17 DIAGNOSIS — J329 Chronic sinusitis, unspecified: Secondary | ICD-10-CM | POA: Diagnosis not present

## 2022-10-17 DIAGNOSIS — J310 Chronic rhinitis: Secondary | ICD-10-CM | POA: Diagnosis not present

## 2022-10-20 DIAGNOSIS — L821 Other seborrheic keratosis: Secondary | ICD-10-CM | POA: Diagnosis not present

## 2022-10-20 DIAGNOSIS — D1801 Hemangioma of skin and subcutaneous tissue: Secondary | ICD-10-CM | POA: Diagnosis not present

## 2022-10-20 DIAGNOSIS — L579 Skin changes due to chronic exposure to nonionizing radiation, unspecified: Secondary | ICD-10-CM | POA: Diagnosis not present

## 2022-10-20 DIAGNOSIS — L905 Scar conditions and fibrosis of skin: Secondary | ICD-10-CM | POA: Diagnosis not present

## 2022-10-20 DIAGNOSIS — Z85828 Personal history of other malignant neoplasm of skin: Secondary | ICD-10-CM | POA: Diagnosis not present

## 2022-10-20 DIAGNOSIS — L259 Unspecified contact dermatitis, unspecified cause: Secondary | ICD-10-CM | POA: Diagnosis not present

## 2022-10-21 ENCOUNTER — Ambulatory Visit (HOSPITAL_COMMUNITY)
Admission: RE | Admit: 2022-10-21 | Discharge: 2022-10-21 | Disposition: A | Discharge status: Home / Self Care | Payer: Medicare Other | Source: Ambulatory Visit | Attending: Internal Medicine | Admitting: Internal Medicine

## 2022-10-21 DIAGNOSIS — R1319 Other dysphagia: Secondary | ICD-10-CM | POA: Diagnosis not present

## 2022-10-21 DIAGNOSIS — K219 Gastro-esophageal reflux disease without esophagitis: Secondary | ICD-10-CM | POA: Diagnosis not present

## 2022-10-22 ENCOUNTER — Encounter: Payer: Self-pay | Admitting: Internal Medicine

## 2022-10-24 DIAGNOSIS — R293 Abnormal posture: Secondary | ICD-10-CM | POA: Diagnosis not present

## 2022-10-31 ENCOUNTER — Encounter: Payer: Self-pay | Admitting: Internal Medicine

## 2022-10-31 DIAGNOSIS — M4316 Spondylolisthesis, lumbar region: Secondary | ICD-10-CM | POA: Diagnosis not present

## 2022-10-31 DIAGNOSIS — M47816 Spondylosis without myelopathy or radiculopathy, lumbar region: Secondary | ICD-10-CM | POA: Diagnosis not present

## 2022-10-31 DIAGNOSIS — G8929 Other chronic pain: Secondary | ICD-10-CM | POA: Diagnosis not present

## 2022-10-31 DIAGNOSIS — M533 Sacrococcygeal disorders, not elsewhere classified: Secondary | ICD-10-CM | POA: Diagnosis not present

## 2022-11-01 DIAGNOSIS — M545 Low back pain, unspecified: Secondary | ICD-10-CM | POA: Diagnosis not present

## 2022-11-02 ENCOUNTER — Ambulatory Visit (INDEPENDENT_AMBULATORY_CARE_PROVIDER_SITE_OTHER): Payer: Medicare Other | Admitting: Internal Medicine

## 2022-11-02 ENCOUNTER — Encounter: Payer: Self-pay | Admitting: Internal Medicine

## 2022-11-02 VITALS — BP 126/76 | HR 87 | Ht 60.0 in | Wt 124.0 lb

## 2022-11-02 DIAGNOSIS — R09A2 Foreign body sensation, throat: Secondary | ICD-10-CM

## 2022-11-02 DIAGNOSIS — R634 Abnormal weight loss: Secondary | ICD-10-CM | POA: Diagnosis not present

## 2022-11-02 DIAGNOSIS — R6881 Early satiety: Secondary | ICD-10-CM

## 2022-11-02 NOTE — Patient Instructions (Signed)
You have been scheduled for an endoscopy. Please follow written instructions given to you at your visit today. If you use inhalers (even only as needed), please bring them with you on the day of your procedure.  _______________________________________________________  If your blood pressure at your visit was 140/90 or greater, please contact your primary care physician to follow up on this.  _______________________________________________________  If you are age 81 or older, your body mass index should be between 23-30. Your Body mass index is 24.33 kg/m. If this is out of the aforementioned range listed, please consider follow up with your Primary Care Provider.  If you are age 67 or younger, your body mass index should be between 19-25. Your Body mass index is 24.33 kg/m. If this is out of the aformentioned range listed, please consider follow up with your Primary Care Provider.   ________________________________________________________  The [facility] providers would like to encourage you to use [facility] to communicate with providers for non-urgent requests or questions.  Due to long hold times on the telephone, sending your provider a message by [facility] may be a faster and more efficient way to get a response.  Please allow 48 business hours for a response.  Please remember that this is for non-urgent requests.  _______________________________________________________  I appreciate the opportunity to care for you. Stan Head, MD, [facility]

## 2022-11-02 NOTE — Progress Notes (Signed)
[name] [name] 82 y.o. Aug 11, 1940 [id number]  Assessment & Plan:   Encounter Diagnoses  Name Primary?   Loss of weight Yes   Globus sensation    Early satiety    Constellation of symptoms concerning for possibility of upper GI tract malignancy.  Evaluate these problems with EGD.  Further plans pending that.    The risks and benefits as well as alternatives of endoscopic procedure(s) have been discussed and reviewed. All questions answered. The patient agrees to proceed.  CC: Panosh, Neta Mends, MD   Subjective:   Chief Complaint: Globus and weight loss  HPI 82 year old white woman with hypertension, GERD, osteoarthritis who has had problems with postnasal drip and voice changes and hoarseness over the past several months.  She has been evaluated by ENT at [facility] and diagnosed with laryngitis sicca, dysphonia and chronic nonallergic rhinitis and sinusitis problems.  She has been treated with antibiotics and steroids and has been undergoing voice therapy and is improved but her hoarseness persists.  Still has problems with postnasal drip as well.  She has been losing weight without intent.  She does not have any dysphagia but there is an intermittent globus sensation.  Sometimes she will have difficulty swallowing liquids where she says she gets strangled but that usually when she will have copious production of mucus and saliva in the mornings.  Weight change as below.  She does endorse early satiety.  Appetite is off as well.  Occasional constipation but overall no significant bowel habit disturbance and no complaints of abdominal pain.  Occasional heartburn treated with Pepto-Bismol.  She takes 20 mg of Protonix every day. Remote negative colonoscopy no EGD in the past. Wt Readings from Last 3 Encounters:  10/13/22 124 lb 9.6 oz (56.5 kg)  09/13/22 128 lb (58.1 kg)  07/20/22 137 lb 6 oz (62.3 kg)   Ba swallow 10/21/22 IMPRESSION: 1. Mild esophageal dysmotility. 2. One episode of  moderate gastroesophageal reflux. 3. No stricture or mass identified  10/17/2022 CT SINUSES WITHOUT CONTRAST   INDICATION: eval for chronic sinusitis, J31.0 Chronic rhinitis, J32.9  Chronic sinusitis, unspecified  FINDINGS:  Maxillary sinuses:   - Right: No significant opacification. Ostiomeatal complex is patent.   - Left: Mild mucosal thickening. Ostiomeatal complex is patent.   Ethmoid air cells:   - Right: No significant opacification   - Left: No significant opacification   Frontal sinuses:   - Right: No significant opacification   - Left: Mild mucosal thickening.   Sphenoid sinuses:   - Right: Secretions within the right sphenoid sinus.   - Left: No significant opacification   Frontal recess: Left frontal recess is opacified.  Sphenoethmoidal recess: Patent   Nasal cavity: Normal.  Nasopharynx: Normal.  Nasal septum: Midline.  Mastoid air cells: Normal.   Sinus anatomic variants:  Olfactory fossa depth (fovea ethmoidalis to lamina cribrosa): < 8 mm  bilaterally  Medial orbital wall and anterior skull base: No fracture deformity or  dehiscence.  Optic and carotid canals: No dehiscence.  Variant ethmoid air cells: No infraorbital (Haller) air cell. No  sphenoethmoidal (Onodi) air cell extending into the anterior clinoid  process or adjacent to the optic canal.  Sphenoid sinus pneumatization pattern: Complete sellar - sphenoid sinus  extends to the clivus.  Anterior ethmoid arteries: There is ethmoid pneumatization above the  ethmoid notch bilaterally.   Degenerative changes of the cervical spine, incompletely evaluated.   IMPRESSION:  1. Mild sinus disease as above with opacification of the left  frontoethmoidal recess.   2. Layering secretions within the right sphenoid sinus which would support  a clinical diagnosis of acute sinusitis.   CT chest w/o contrast 08/01/2022 IMPRESSION:  1. Scattered foci of bibasilar platelike atelectasis, in addition to near   complete atelectasis of the right middle lobe.  2.  Moderate thoracic spine kyphosis.  Echocardiogram [facility] 05/10/2022 --------------   NORMAL LEFT VENTRICULAR SYSTOLIC FUNCTION WITH MILD LVH    NORMAL LA PRESSURES WITH NORMAL DIASTOLIC FUNCTION    NORMAL RIGHT VENTRICULAR SYSTOLIC FUNCTION    VALVULAR REGURGITATION: MILD MR, TRIVIAL PR, TRIVIAL TR    NO VALVULAR STENOSIS    NO PRIOR STUDY FOR COMPARISO   Allergies  Allergen Reactions   Capsicum Annuum Extract & Derivative (Bell Pepper) [Capsicum]     Other reaction(s): Other (See Comments)   Milk-Related Compounds Other (See Comments)   Onion Other (See Comments)   Other Other (See Comments)    Green Peppers   Current Meds  Medication Sig   amLODipine (NORVASC) 5 MG tablet Take 1 tablet (5 mg total) by mouth daily.   aspirin 325 MG EC tablet Take 325 mg by mouth every 6 (six) hours as needed for pain.   b complex vitamins tablet Take 1 tablet by mouth daily.   CALCIUM PO Take 1,200 mg by mouth daily.    denosumab (PROLIA) 60 MG/ML SOLN injection Inject 60 mg into the skin every 6 (six) months. Administer in upper arm, thigh, or abdomen   levothyroxine (SYNTHROID) 100 MCG tablet TAKE 1 TABLET(100 MCG) BY MOUTH DAILY   pantoprazole (PROTONIX) 40 MG tablet Take 1 tablet (40 mg total) by mouth in the morning. (Patient taking differently: Take 20 mg by mouth in the morning.)   Spacer/Aero-Holding Chambers (EASIVENT) inhaler See admin instructions.   Past Medical History:  Diagnosis Date   COVID 10/25/2020   Dog bite of thigh 04/09/2012   Elevated lipids    GERD (gastroesophageal reflux disease)    Hypertension    Hypothyroidism    post thyroidectomy   Osteoarthritis    Osteopenia    t -2.5 hip in 2005 by Dexa   Osteoporosis    commpr fx after dog related fall  injury   Seasonal rhinitis    spring and fall   Skin cancer    squamous cell ca chest   Vaginal prolapse    hs corrected   Past Surgical History:  Procedure  Laterality Date   ABDOMINAL HYSTERECTOMY  07/2007   bladders sling surgery  07/2007   COLONOSCOPY  2006   THYROIDECTOMY     Social History   Social History Narrative   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   Review of Systems As per HPI otherwise also has some chronic back pain and arthritis problems fatigue and insomnia.  Otherwise negative.  Objective:   Physical Exam @BP  126/76   Pulse 87   Ht 5' (1.524 m)   BMI 24.33 kg/m @  General:  Spry elderly white woman somewhat thin, in no acute distress Eyes:  anicteric. ENT:   Mouth and posterior pharynx free of lesions.  Dentition in good repair.  She is hoarse.  I do not  see any mucosal changes consistent with postnasal drip. Lungs: Clear to auscultation bilaterally.  Breath sounds are somewhat decreased. Chest:  Kyphotic dextroscoliosis. Heart:   S1S2, no rubs, murmurs, gallops. Abdomen:  soft, non-tender, no hepatosplenomegaly, hernia, or mass and BS+.  Neuro:  A&O x 3.  Psych:  appropriate mood and  Affect.  Lab Data Reviewed: See HPI but I have reviewed ENT notes from [facility] primary care notes from this year as well.  Barium swallow imaging was personally reviewed.

## 2022-11-08 ENCOUNTER — Other Ambulatory Visit: Payer: Self-pay

## 2022-11-08 ENCOUNTER — Ambulatory Visit (AMBULATORY_SURGERY_CENTER): Payer: Medicare Other | Admitting: Internal Medicine

## 2022-11-08 ENCOUNTER — Other Ambulatory Visit: Payer: Self-pay | Admitting: Internal Medicine

## 2022-11-08 ENCOUNTER — Encounter: Payer: Self-pay | Admitting: Internal Medicine

## 2022-11-08 ENCOUNTER — Telehealth: Payer: Self-pay | Admitting: Internal Medicine

## 2022-11-08 VITALS — BP 130/70 | HR 84 | Temp 97.5°F | Resp 16 | Ht 60.0 in | Wt 124.0 lb

## 2022-11-08 DIAGNOSIS — K2289 Other specified disease of esophagus: Secondary | ICD-10-CM

## 2022-11-08 DIAGNOSIS — R634 Abnormal weight loss: Secondary | ICD-10-CM

## 2022-11-08 DIAGNOSIS — K229 Disease of esophagus, unspecified: Secondary | ICD-10-CM | POA: Diagnosis not present

## 2022-11-08 DIAGNOSIS — R09A2 Foreign body sensation, throat: Secondary | ICD-10-CM | POA: Diagnosis not present

## 2022-11-08 DIAGNOSIS — K317 Polyp of stomach and duodenum: Secondary | ICD-10-CM | POA: Diagnosis not present

## 2022-11-08 DIAGNOSIS — K2950 Unspecified chronic gastritis without bleeding: Secondary | ICD-10-CM | POA: Diagnosis not present

## 2022-11-08 MED ORDER — SODIUM CHLORIDE 0.9 % IV SOLN: 500.0000 mL | Freq: Once | INTRAVENOUS | Status: DC

## 2022-11-08 NOTE — Telephone Encounter (Signed)
Patient needs CT soft tissue neck with and without contrast  Reasons are:  Esophageal mass (nodule seen in proximal esophagus - can use mass re: indications) Weight loss  Please arrange - would probably call tomorrow after she recovers from EGD today

## 2022-11-08 NOTE — Patient Instructions (Addendum)
YOU HAD AN ENDOSCOPIC PROCEDURE TODAY AT THE [facility]:   Refer to the procedure report that was given to you for any specific questions about what was found during the examination.  If the procedure report does not answer your questions, please call your gastroenterologist to clarify.  If you requested that your care partner not be given the details of your procedure findings, then the procedure report has been included in a sealed envelope for you to review at your convenience later.  YOU SHOULD EXPECT: Some feelings of bloating in the abdomen. Passage of more gas than usual.  Walking can help get rid of the air that was put into your GI tract during the procedure and reduce the bloating. If you had a lower endoscopy (such as a colonoscopy or flexible sigmoidoscopy) you may notice spotting of blood in your stool or on the toilet paper. If you underwent a bowel prep for your procedure, you may not have a normal bowel movement for a few days.  Please Note:  You might notice some irritation and congestion in your nose or some drainage.  This is from the oxygen used during your procedure.  There is no need for concern and it should clear up in a day or so.  SYMPTOMS TO REPORT IMMEDIATELY:   Following upper endoscopy (EGD)  Vomiting of blood or coffee ground material  New chest pain or pain under the shoulder blades  Painful or persistently difficult swallowing  New shortness of breath  Fever of 100F or higher  Black, tarry-looking stools  For urgent or emergent issues, a gastroenterologist can be reached at any hour by calling [phone]. Do not use MyChart messaging for urgent concerns.    DIET:  We do recommend a small meal at first, but then you may proceed to your regular diet.  Drink plenty of fluids but you should avoid alcoholic beverages for 24 hours.  MEDICATIONS: Continue present medications.  FOLLOW UP: Await pathology results. Nodule in proximal esophagus was  firm and submucosal. You had a chest CT at [facility] in 07/2022 without correlation. Dr. Leone Payor recommends follow up with a CT scan of the neck. Dr. Marvell Fuller nurse will call you to set up/schedule this exam.  Thank you for allowing [place] to provide for your healthcare needs today.  ACTIVITY:  You should plan to take it easy for the rest of today and you should NOT DRIVE or use heavy machinery until tomorrow (because of the sedation medicines used during the test).    FOLLOW UP: Our staff will call the number listed on your records the next business day following your procedure.  We will call around 7:15- 8:00 am to check on you and address any questions or concerns that you may have regarding the information given to you following your procedure. If we do not reach you, we will leave a message.     If any biopsies were taken you will be contacted by phone or by letter within the next 1-3 weeks.  Please call us at [phone] if you have not heard about the biopsies in 3 weeks.    SIGNATURES/CONFIDENTIALITY: You and/or your care partner have signed paperwork which will be entered into your electronic medical record.  These signatures attest to the fact that that the information above on your After Visit Summary has been reviewed and is understood.  Full responsibility of the confidentiality of this discharge information lies with you and/or your care-partner.The main abnormality was  a nodule in the upper esophagus - but it is covered by esophageal lining so it is outside of that. You had a chest CT in February that did not show major problems to my knowledge. Need to consider imaging of neck. Will discuss and decide.  I did take stomach biopsies to check for an infection called H pylori.  I appreciate the opportunity to care for you. Iva Boop, MD, [facility]

## 2022-11-08 NOTE — Op Note (Signed)
[facility] Patient Name: [name] [name] Procedure Date: 11/08/2022 9:53 AM MRN: [id number] Endoscopist: Iva Boop , MD, [id number] Age: 82 Referring MD:  Date of Birth: 10-11-1940 Gender: Female Account #: [id number] Procedure:                Upper GI endoscopy Indications:              Globus sensation, Weight loss Medicines:                Monitored Anesthesia Care Procedure:                Pre-Anesthesia Assessment:                           - Prior to the procedure, a History and Physical                            was performed, and patient medications and                            allergies were reviewed. The patient's tolerance of                            previous anesthesia was also reviewed. The risks                            and benefits of the procedure and the sedation                            options and risks were discussed with the patient.                            All questions were answered, and informed consent                            was obtained. Prior Anticoagulants: The patient has                            taken no anticoagulant or antiplatelet agents. ASA                            Grade Assessment: II - A patient with mild systemic                            disease. After reviewing the risks and benefits,                            the patient was deemed in satisfactory condition to                            undergo the procedure.                           After obtaining informed consent, the endoscope was  passed under direct vision. Throughout the                            procedure, the patient's blood pressure, pulse, and                            oxygen saturations were monitored continuously. The                            GIF [id number] #[id number] was introduced through the                            mouth, and advanced to the second part of duodenum.                            The upper GI endoscopy  was accomplished without                            difficulty. The patient tolerated the procedure                            well. Scope In: Scope Out: Findings:                 A single small submucosal nodule with a localized                            distribution was found in the proximal esophagus.                           The examined esophagus was mildly tortuous.                           Patchy mildly erythematous mucosa without bleeding                            was found in the gastric antrum. Biopsies were                            taken with a cold forceps for histology.                            Verification of patient identification for the                            specimen was done. Estimated blood loss was minimal.                           The exam was otherwise without abnormality.                           The cardia and gastric fundus were normal on                            retroflexion.  Biopsies were taken with a cold forceps in the                            gastric body for histology. Verification of patient                            identification for the specimen was done. Estimated                            blood loss was minimal. Complications:            No immediate complications. Estimated Blood Loss:     Estimated blood loss was minimal. Impression:               - Submucosal nodule found in the esophagus.                           - Tortuous esophagus.                           - Erythematous mucosa in the antrum. Biopsied.                           - The examination was otherwise normal. there were                            just a few white plaques in upper esophagus and                            middle that represent Candida colonozation from                            inhalers - it is minimal and not an issue in my                            opinion                           - Biopsies were taken with a cold  forceps for                            histology in the gastric body. Recommendation:           - Patient has a contact number available for                            emergencies. The signs and symptoms of potential                            delayed complications were discussed with the                            patient. Return to normal activities tomorrow.  Written discharge instructions were provided to the                            patient.                           - Resume previous diet.                           - Continue present medications.                           - Await pathology results.                           - Nodule in proximal esophagus was firm and                            submucosal. had Chest CT at [facility] 07/2022 w/o                            correlation                           Will check [place] vs CT of neck - will discuss Iva Boop, MD 11/08/2022 10:29:44 AM This report has been signed electronically.

## 2022-11-08 NOTE — Progress Notes (Signed)
Called to room to assist during endoscopic procedure.  Patient ID and intended procedure confirmed with present staff. Received instructions for my participation in the procedure from the performing physician.  

## 2022-11-08 NOTE — Progress Notes (Signed)
History and Physical Interval Note:  11/08/2022 10:03 AM  [name] [name]  has presented today for endoscopic procedure(s), with the diagnosis of  Encounter Diagnosis  Name Primary?   Loss of weight Yes  .  The various methods of evaluation and treatment have been discussed with the patient and/or family. After consideration of risks, benefits and other options for treatment, the patient has consented to  the endoscopic procedure(s).   The patient's history has been reviewed, patient examined, no change in status, stable for endoscopic procedure(s).  I have reviewed the patient's chart and labs.  Questions were answered to the patient's satisfaction.     Iva Boop, MD, Clementeen Graham

## 2022-11-08 NOTE — Telephone Encounter (Unsigned)
Pt was scheduled for a CT of the neck on 11/24/2022 at 2:30 at [facility]. Pt to arrive at 2:15 PM. Liquids only 4 hours prior.  Will notify pt tomorrow.

## 2022-11-08 NOTE — Progress Notes (Signed)
Pt's states no medical or surgical changes since previsit or office visit. 

## 2022-11-09 ENCOUNTER — Telehealth: Payer: Self-pay | Admitting: *Deleted

## 2022-11-09 NOTE — Telephone Encounter (Signed)
Pt contacted and made aware of Dr. Leone Payor recommendations: Pt was notified that she has been scheduled for a CT of the neck on 11/24/2022 at 2:30 at [facility]. Pt to arrive at 2:15 PM. Liquids only 4 hours prior.  Pt verbalized understanding with all questions answered.

## 2022-11-09 NOTE — Telephone Encounter (Signed)
  Follow up Call-    Row Labels 11/08/2022    9:28 AM  Call back number   Section Header. No data exists in this row.   Post procedure Call Back phone  #   [phone]  Permission to leave phone message   Yes     Patient questions:  Do you have a fever, pain , or abdominal swelling? No. Pain Score  0 *  Have you tolerated food without any problems? Yes.    Have you been able to return to your normal activities? Yes.    Do you have any questions about your discharge instructions: Diet   No. Medications  No. Follow up visit  No.  Do you have questions or concerns about your Care? No.  Actions: * If pain score is 4 or above: No action needed, pain <4.

## 2022-11-15 DIAGNOSIS — J310 Chronic rhinitis: Secondary | ICD-10-CM | POA: Diagnosis not present

## 2022-11-22 ENCOUNTER — Telehealth: Payer: Self-pay | Admitting: *Deleted

## 2022-11-22 DIAGNOSIS — M810 Age-related osteoporosis without current pathological fracture: Secondary | ICD-10-CM

## 2022-11-22 MED ORDER — DENOSUMAB 60 MG/ML ~~LOC~~ SOSY
60.0000 mg | PREFILLED_SYRINGE | Freq: Once | SUBCUTANEOUS | Status: AC
  Administered 2023-01-05: 60 mg via SUBCUTANEOUS

## 2022-11-22 NOTE — Telephone Encounter (Signed)
Call to patient. Prolia benefits reviewed with patient as seen below. Patient agreeable. Patient requests to schedule on 01-05-23 at 11:15. Patient agreeable to date and time of appointment.   Summary of benefits scanned into Epic. Encounter closed.

## 2022-11-22 NOTE — Telephone Encounter (Signed)
Insurance information submitted to [employer] portal. Will await summary of benefits for prolia.

## 2022-11-22 NOTE — Telephone Encounter (Signed)
Deductible:  $240 ($240 met)    OOP MAX: no   Annual exam: 03-09-22 ML  Calcium:     9.2       Date: 07-20-22   Upcoming dental procedures: No   Hx of Kidney Disease: No   Last Bone Density Scan: 05-23-22   Is Prior Authorization needed: no  Pt estimated Cost: $0    Coverage Details: Prolia and administration will be subject to a $240 deductible ($240 met) and 20% coinsurance. For the secondary MD , this is a Medicare Supplement Plan F and it covers the Medicare Part B deductible, co-insurance and 100% of excess charges.

## 2022-11-23 ENCOUNTER — Telehealth: Payer: Self-pay

## 2022-11-23 ENCOUNTER — Other Ambulatory Visit: Payer: Self-pay

## 2022-11-23 DIAGNOSIS — K229 Disease of esophagus, unspecified: Secondary | ICD-10-CM

## 2022-11-23 DIAGNOSIS — R634 Abnormal weight loss: Secondary | ICD-10-CM

## 2022-11-23 DIAGNOSIS — K2289 Other specified disease of esophagus: Secondary | ICD-10-CM

## 2022-11-23 NOTE — Telephone Encounter (Signed)
Received call from [name] [name] at [facility] CT department stating that they need the order for the CT neck to be  with contrast and not with and without. Order was changed. Scheduling notified and was changed.  Rebecka Apley made aware.

## 2022-11-24 ENCOUNTER — Ambulatory Visit (HOSPITAL_COMMUNITY)
Admission: RE | Admit: 2022-11-24 | Discharge: 2022-11-24 | Disposition: A | Discharge status: Home / Self Care | Payer: Medicare Other | Source: Ambulatory Visit | Attending: Internal Medicine | Admitting: Internal Medicine

## 2022-11-24 ENCOUNTER — Ambulatory Visit (HOSPITAL_COMMUNITY): Payer: Medicare Other

## 2022-11-24 DIAGNOSIS — K2289 Other specified disease of esophagus: Secondary | ICD-10-CM | POA: Diagnosis not present

## 2022-11-24 DIAGNOSIS — R634 Abnormal weight loss: Secondary | ICD-10-CM | POA: Insufficient documentation

## 2022-11-24 DIAGNOSIS — K229 Disease of esophagus, unspecified: Secondary | ICD-10-CM | POA: Diagnosis not present

## 2022-11-24 MED ORDER — IOHEXOL 300 MG/ML  SOLN
75.0000 mL | Freq: Once | INTRAMUSCULAR | Status: AC | PRN
  Administered 2022-11-24: 75 mL via INTRAVENOUS

## 2022-11-28 ENCOUNTER — Encounter: Payer: Self-pay | Admitting: Emergency Medicine

## 2022-11-28 ENCOUNTER — Ambulatory Visit (INDEPENDENT_AMBULATORY_CARE_PROVIDER_SITE_OTHER): Payer: Medicare Other | Admitting: Emergency Medicine

## 2022-11-28 VITALS — BP 124/74 | HR 95 | Temp 98.5°F | Ht 60.0 in | Wt 126.0 lb

## 2022-11-28 DIAGNOSIS — R051 Acute cough: Secondary | ICD-10-CM | POA: Diagnosis not present

## 2022-11-28 DIAGNOSIS — J22 Unspecified acute lower respiratory infection: Secondary | ICD-10-CM | POA: Diagnosis not present

## 2022-11-28 MED ORDER — DM-GUAIFENESIN ER 30-600 MG PO TB12: 1.0000 | ORAL_TABLET | Freq: Two times a day (BID) | ORAL | 3 refills | Status: DC | PRN

## 2022-11-28 MED ORDER — AZITHROMYCIN 250 MG PO TABS: ORAL_TABLET | ORAL | 0 refills | Status: DC

## 2022-11-28 MED ORDER — BENZONATATE 100 MG PO CAPS: 100.0000 mg | ORAL_CAPSULE | Freq: Two times a day (BID) | ORAL | 0 refills | Status: DC | PRN

## 2022-11-28 NOTE — Assessment & Plan Note (Signed)
Viral respiratory infection now with secondary bacterial infection No signs of pneumonia Will benefit from daily azithromycin for 5 days ED precautions given May continue albuterol as needed

## 2022-11-28 NOTE — Assessment & Plan Note (Signed)
Cough management discussed Recommend to start Mucinex DM and Tessalon 100 mg as needed Advised to rest and stay well-hydrated Recommend occasional cough drops Advised to contact the office if no better or worse during the next several days

## 2022-11-28 NOTE — Patient Instructions (Signed)
Cough, Adult A cough helps to clear your throat and lungs. It may be a sign of an illness or another condition. A short-term (acute) cough may last 2-3 weeks. A long-term (chronic) cough may last 8 or more weeks. Many things can cause a cough. They include: Illnesses such as: An infection in your throat or lungs. Asthma or other heart or lung problems. Gastroesophageal reflux. This is when acid comes back up from your stomach. Breathing in things that bother (irritate) your lungs. Allergies. Postnasal drip. This is when mucus runs down the back of your throat. Smoking. Some medicines. Follow these instructions at home: Medicines Take over-the-counter and prescription medicines only as told by your doctor. Talk with your doctor before you take cough medicine (cough suppressants). Eating and drinking Do not drink alcohol. Do not drink caffeine. Drink enough fluid to keep your pee (urine) pale yellow. Lifestyle Stay away from cigarette smoke. Do not smoke or use any products that contain nicotine or tobacco. If you need help quitting, ask your doctor. Stay away from things that make you cough. These may include perfume, candles, cleaning products, or campfire smoke. General instructions  Watch for any changes to your cough. Tell your doctor about them. Always cover your mouth when you cough. If the air is dry in your home, use a cool mist vaporizer or humidifier. If your cough is worse at night, try using extra pillows to raise your head up higher while you sleep. Rest as needed. Contact a doctor if: You have new symptoms. Your symptoms get worse. You cough up pus. You have a fever that does not go away. Your cough does not get better after 2-3 weeks. Cough medicine does not help, and you are not sleeping well. You have pain that gets worse or is not helped with medicine. You are losing weight and do not know why. You have night sweats. Get help right away if: You cough up  blood. You have trouble breathing. Your heart is beating very fast. These symptoms may be an emergency. Get help right away. Call 911. Do not wait to see if the symptoms will go away. Do not drive yourself to the hospital. This information is not intended to replace advice given to you by your health care provider. Make sure you discuss any questions you have with your health care provider. Document Revised: 02/11/2022 Document Reviewed: 02/11/2022 Elsevier Patient Education  2024 Elsevier Inc.  

## 2022-11-28 NOTE — Progress Notes (Signed)
[name] [name] 82 y.o.   Chief Complaint  Patient presents with   Cough    Congestion, nasal congestion, rt ear issues, headache, body chills x 3 days     HISTORY OF PRESENT ILLNESS: Acute problem visit today. This is a 82 y.o. female complaining of productive cough with chest congestion that started 3 days ago with headache and chills Feels worse today. Denies difficulty breathing or chest pain Able to eat and drink.  Denies nausea or vomiting.  Denies abdominal pain or diarrhea No other complaints or medical concerns today.  Cough Associated symptoms include chills. Pertinent negatives include no chest pain, headaches, rash or shortness of breath.     Prior to Admission medications   Medication Sig Start Date End Date Taking? Authorizing Provider  albuterol (VENTOLIN HFA) 108 (90 Base) MCG/ACT inhaler SMARTSIG:2 Inhalation Via Inhaler Every 6 Hours PRN 11/10/22  Yes [provider]  amLODipine (NORVASC) 5 MG tablet Take 1 tablet (5 mg total) by mouth daily. 05/13/22  Yes Maisie Fus, MD  aspirin 325 MG EC tablet Take 325 mg by mouth every 6 (six) hours as needed for pain.   Yes [provider]  b complex vitamins tablet Take 1 tablet by mouth daily.   Yes [provider]  CALCIUM PO Take 1,200 mg by mouth daily.    Yes [provider]  denosumab (PROLIA) 60 MG/ML SOLN injection Inject 60 mg into the skin every 6 (six) months. Administer in upper arm, thigh, or abdomen   Yes [provider]  famotidine (PEPCID) 40 MG tablet Take 1 tablet (40 mg total) by mouth every evening. 07/14/22  Yes Nelwyn Salisbury, MD  ipratropium (ATROVENT) 0.06 % nasal spray Place into the nose. 11/15/22 11/15/23 Yes [provider]  levothyroxine (SYNTHROID) 100 MCG tablet TAKE 1 TABLET(100 MCG) BY MOUTH DAILY 08/07/22  Yes Worthy Rancher B, FNP  pantoprazole (PROTONIX) 40 MG tablet Take 1 tablet (40 mg total) by mouth in the morning. Patient taking  differently: Take 20 mg by mouth in the morning. 07/14/22  Yes Nelwyn Salisbury, MD  Spacer/Aero-Holding Chambers (EASIVENT) inhaler See admin instructions. 04/22/22 04/22/23 Yes [provider]  azelastine (ASTELIN) 0.1 % nasal spray Place 1 spray into both nostrils 2 (two) times daily. Patient not taking: Reported on 10/13/2022 03/15/22   [provider]  fexofenadine (ALLEGRA) 60 MG tablet Take 1 tablet (60 mg total) by mouth daily for 15 days. 09/13/22 09/28/22  Trevor Iha, FNP  fluticasone (FLONASE) 50 MCG/ACT nasal spray Place 2 sprays into both nostrils daily. Patient not taking: Reported on 11/28/2022 08/16/22   Rodriguez-Southworth, Nettie Elm, PA-C    Allergies  Allergen Reactions   Capsicum Annuum Extract & Derivative (Bell Pepper) [Capsicum]     Other reaction(s): Other (See Comments)   Milk-Related Compounds Other (See Comments)   Onion Other (See Comments)   Other Other (See Comments)    Malon Kindle    Patient Active Problem List   Diagnosis Date Noted   Chronic cough 07/14/2022   Hoarseness of voice 07/14/2022   GERD (gastroesophageal reflux disease) 07/14/2022   Medication management 10/16/2013   Medicare annual wellness visit, subsequent 07/16/2012   Heart palpitations 29-May-2011   Family history of sudden death 2011-05-29   PALPITATIONS 03/31/2010   BACK PAIN, LUMBAR 10/13/2009   HEARTBURN 10/13/2009   FOOT PAIN 03/31/2008   Hypothyroidism 01/25/2007   Osteoporosis 01/25/2007   OSTEOPENIA 01/25/2007    Past Medical History:  Diagnosis  Date   COVID 10/25/2020   Dog bite of thigh 04/09/2012   Elevated lipids    GERD (gastroesophageal reflux disease)    Hypertension    Hypothyroidism    post thyroidectomy   Osteoarthritis    Osteopenia    t -2.5 hip in 2005 by Dexa   Osteoporosis    commpr fx after dog related fall  injury   Seasonal rhinitis    spring and fall   Skin cancer    squamous cell ca chest   Vaginal prolapse    hs corrected     Past Surgical History:  Procedure Laterality Date   ABDOMINAL HYSTERECTOMY  07/2007   bladders sling surgery  07/2007   COLONOSCOPY  2006   THYROIDECTOMY      Social History   Socioeconomic History   Marital status: Widowed    Spouse name: Not on file   Number of children: 1   Years of education: Not on file   Highest education level: Associate degree: academic program  Occupational History   Occupation: retired  Tobacco Use   Smoking status: Never   Smokeless tobacco: Never  Vaping Use   Vaping Use: Never used  Substance and Sexual Activity   Alcohol use: Yes    Comment: occasionally   Drug use: No   Sexual activity: Not Currently    Partners: Male    Birth control/protection: Surgical    Comment: hysterectomy, older than 16, less than 5  Other Topics Concern   Not on file  Social History Narrative   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   Regular Exercise- yes      1 son, never smoker no alcohol 1 caffeinated beverage   Social Determinants of Health   Financial Resource Strain: Low Risk  (05/24/2022)   Overall Financial Resource Strain (CARDIA)    Difficulty of Paying Living Expenses: Not hard at all  Food Insecurity: No Food Insecurity (05/24/2022)   Hunger Vital Sign    Worried About Running Out of Food in the Last Year: Never true    Ran Out of Food in the Last Year: Never true  Transportation Needs: No Transportation Needs (05/24/2022)   PRAPARE - Administrator, Civil Service (Medical): No    Lack of Transportation (Non-Medical): No  Physical Activity: Insufficiently Active (05/24/2022)   Exercise Vital Sign    Days of Exercise per Week: 5 days    Minutes of Exercise per Session: 20 min  Stress: No Stress Concern Present (05/24/2022)   [employer] of Occupational Health - Occupational Stress Questionnaire    Feeling of Stress : Not at all  Social Connections: Moderately Integrated (05/24/2022)    Social Connection and Isolation Panel [NHANES]    Frequency of Communication with Friends and Family: More than three times a week    Frequency of Social Gatherings with Friends and Family: Once a week    Attends Religious Services: More than 4 times per year    Active Member of [employer] or Organizations: Yes    Attends Banker Meetings: More than 4 times per year    Marital Status: Widowed  Intimate Partner Violence: Not At Risk (11/24/2020)   Humiliation, Afraid, Rape, and Kick questionnaire    Fear of Current or Ex-Partner: No    Emotionally Abused: No    Physically Abused: No    Sexually Abused: No    Family History  Problem Relation Age  of Onset   Heart attack Mother    Alcohol abuse Mother    Kidney cancer Father    Hypertension Maternal Grandmother    Esophageal cancer Neg Hx    Colon cancer Neg Hx    Pancreatic cancer Neg Hx    Stomach cancer Neg Hx      Review of Systems  Constitutional:  Positive for chills.  HENT:  Positive for congestion.   Respiratory:  Positive for cough and sputum production. Negative for shortness of breath.   Cardiovascular:  Negative for chest pain and palpitations.  Gastrointestinal:  Negative for abdominal pain, diarrhea, nausea and vomiting.  Genitourinary: Negative.  Negative for dysuria and hematuria.  Skin:  Negative for rash.  Neurological: Negative.  Negative for dizziness and headaches.    Vitals:   11/28/22 1029  BP: 124/74  Pulse: 95  Temp: 98.5 F (36.9 C)  SpO2: 97%    Physical Exam Vitals reviewed.  Constitutional:      Appearance: Normal appearance.  HENT:     Head: Normocephalic.     Mouth/Throat:     Mouth: Mucous membranes are moist.     Pharynx: Oropharynx is clear.  Eyes:     Extraocular Movements: Extraocular movements intact.     Conjunctiva/sclera: Conjunctivae normal.     Pupils: Pupils are equal, round, and reactive to light.  Cardiovascular:     Rate and Rhythm: Normal rate and regular  rhythm.     Pulses: Normal pulses.     Heart sounds: Normal heart sounds.  Pulmonary:     Effort: Pulmonary effort is normal.     Breath sounds: Normal breath sounds.  Abdominal:     Palpations: Abdomen is soft.  Musculoskeletal:     Cervical back: No tenderness.  Lymphadenopathy:     Cervical: No cervical adenopathy.  Skin:    General: Skin is warm and dry.  Neurological:     Mental Status: She is alert and oriented to person, place, and time.  Psychiatric:        Mood and Affect: Mood normal.        Behavior: Behavior normal.      ASSESSMENT & PLAN: A total of 32 minutes was spent with the patient and counseling/coordination of care regarding preparing for this visit, review of most recent office visit notes, review of multiple chronic medical conditions under management, review of all medications, diagnosis of lower respiratory infection and need for antibiotics, cough management, prognosis, documentation, and need for follow-up if no better or worse during the next several days.  Problem List Items Addressed This Visit       Respiratory   Lower respiratory infection    Viral respiratory infection now with secondary bacterial infection No signs of pneumonia Will benefit from daily azithromycin for 5 days ED precautions given May continue albuterol as needed      Relevant Medications   azithromycin (ZITHROMAX) 250 MG tablet     Other   Acute cough - Primary    Cough management discussed Recommend to start Mucinex DM and Tessalon 100 mg as needed Advised to rest and stay well-hydrated Recommend occasional cough drops Advised to contact the office if no better or worse during the next several days      Relevant Medications   benzonatate (TESSALON) 100 MG capsule   dextromethorphan-guaiFENesin (MUCINEX DM) 30-600 MG 12hr tablet   Patient Instructions  Cough, Adult A cough helps to clear your throat and lungs. It may be  a sign of an illness or another  condition. A short-term (acute) cough may last 2-3 weeks. A long-term (chronic) cough may last 8 or more weeks. Many things can cause a cough. They include: Illnesses such as: An infection in your throat or lungs. Asthma or other heart or lung problems. Gastroesophageal reflux. This is when acid comes back up from your stomach. Breathing in things that bother (irritate) your lungs. Allergies. Postnasal drip. This is when mucus runs down the back of your throat. Smoking. Some medicines. Follow these instructions at home: Medicines Take over-the-counter and prescription medicines only as told by your doctor. Talk with your doctor before you take cough medicine (cough suppressants). Eating and drinking Do not drink alcohol. Do not drink caffeine. Drink enough fluid to keep your pee (urine) pale yellow. Lifestyle Stay away from cigarette smoke. Do not smoke or use any products that contain nicotine or tobacco. If you need help quitting, ask your doctor. Stay away from things that make you cough. These may include perfume, candles, cleaning products, or campfire smoke. General instructions  Watch for any changes to your cough. Tell your doctor about them. Always cover your mouth when you cough. If the air is dry in your home, use a cool mist vaporizer or humidifier. If your cough is worse at night, try using extra pillows to raise your head up higher while you sleep. Rest as needed. Contact a doctor if: You have new symptoms. Your symptoms get worse. You cough up pus. You have a fever that does not go away. Your cough does not get better after 2-3 weeks. Cough medicine does not help, and you are not sleeping well. You have pain that gets worse or is not helped with medicine. You are losing weight and do not know why. You have night sweats. Get help right away if: You cough up blood. You have trouble breathing. Your heart is beating very fast. These symptoms may be an  emergency. Get help right away. Call 911. Do not wait to see if the symptoms will go away. Do not drive yourself to the hospital. This information is not intended to replace advice given to you by your health care provider. Make sure you discuss any questions you have with your health care provider. Document Revised: 02/11/2022 Document Reviewed: 02/11/2022 Elsevier Patient Education  2024 Elsevier Inc.    Edwina Barth, MD [facility]

## 2022-12-02 ENCOUNTER — Ambulatory Visit: Payer: Medicare Other | Admitting: Adult Health

## 2022-12-05 ENCOUNTER — Ambulatory Visit (INDEPENDENT_AMBULATORY_CARE_PROVIDER_SITE_OTHER): Payer: Medicare Other | Admitting: Family Medicine

## 2022-12-05 ENCOUNTER — Encounter: Payer: Self-pay | Admitting: Family Medicine

## 2022-12-05 VITALS — BP 126/80 | HR 94 | Temp 97.6°F | Resp 16 | Ht 60.0 in | Wt 122.4 lb

## 2022-12-05 DIAGNOSIS — R053 Chronic cough: Secondary | ICD-10-CM | POA: Diagnosis not present

## 2022-12-05 DIAGNOSIS — R0989 Other specified symptoms and signs involving the circulatory and respiratory systems: Secondary | ICD-10-CM | POA: Diagnosis not present

## 2022-12-05 MED ORDER — BUDESONIDE-FORMOTEROL FUMARATE 80-4.5 MCG/ACT IN AERO: 2.0000 | INHALATION_SPRAY | Freq: Two times a day (BID) | RESPIRATORY_TRACT | 2 refills | Status: DC

## 2022-12-05 NOTE — Patient Instructions (Addendum)
A few things to remember from today's visit:  Chronic cough  Labile hypertension  Let's try Symbicort 2 puff 2 times daily, rinse mouth after use. Continue Albuterol inh as needed, 3-4 times per day. Plain Mucinex daily as needed. Adequate hydration. Deep breaths a few times through the day. Follow up in 2 months. Monitor for new symptoms.  If you need refills for medications you take chronically, please call your pharmacy. Do not use My Chart to request refills or for acute issues that need immediate attention. If you send a my chart message, it may take a few days to be addressed, specially if I am not in the office.  Please be sure medication list is accurate. If a new problem present, please set up appointment sooner than planned today.

## 2022-12-05 NOTE — Progress Notes (Signed)
ACUTE VISIT Chief Complaint  Patient presents with   Nasal Congestion    Ongoing since last week, about 6 days. Getting a little better   Cough   Headache   HPI: Ms.Sheng Kalinowski is a 82 y.o. female with past medical history significant for GERD,, hypothyroidism,HTN, and OA here today with her son complaining of persistent productive cough with whitish sputum. She denies associated DOE but feels short of breath when she has coughing spells.  Cough The current episode started more than 1 year ago. The problem has been waxing and waning. The cough is Productive of sputum. Associated symptoms include headaches (frontal pressure headache with coughing episodes.), postnasal drip and rhinorrhea. Pertinent negatives include no chest pain, chills, ear congestion, ear pain, fever, heartburn, hemoptysis, myalgias, nasal congestion, rash, sore throat, shortness of breath, sweats, weight loss or wheezing. Nothing aggravates the symptoms. She has tried prescription cough suppressant and a beta-agonist inhaler for the symptoms. The treatment provided mild relief. Her past medical history is significant for environmental allergies.   Evaluated on 11/28/2022 for respiratory symptoms.  She was treated with azithromycin, benzonatate, and Mucinex DM. According to her son, she has had these symptoms for years. She has undergone imaging studies, has been treated with abx a few times,and evaluated by ENT,immunologist,and pulmonologist. She has been dx'ed with vocal cord atrophy. Negative for orthopnea or PND. O2 sats at home in the 90's, occasionally 89.  She has also had cardiac and GI work up. 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  She is on Albuterol inh, which seems to help. She has not identified exacerbating factors. Her son reports that during exacerbations she develops severe fatigue.  CXR on 07/23/22 showed increased right infrahilar opacity concerning for consolidation  in the right middle lobe or right lower lobe anterior basal segment. Chest CT done on 08/01/22:  1.  Scattered foci of bibasilar platelike atelectasis, in addition to near complete atelectasis of the right middle lobe.  2.  Moderate thoracic spine kyphosis.   CT brain lab sonus w/o contrast on 10/17/22:Mild sinus disease as above with opacification of the left fronto-ethmoidal recess.   GERD on Protonix 20 mg daily and Pepcid 40 mg daily. She has occasional nausea with coughing spells and post nasal drainage.  Her son also mentions that sometimes she skipped her antihypertensive medication when her BP is low. She is on Amlodipine 5 mg daily.  Review of Systems  Constitutional:  Negative for chills, fever and weight loss.  HENT:  Positive for postnasal drip and rhinorrhea. Negative for ear pain and sore throat.   Respiratory:  Positive for cough. Negative for hemoptysis, shortness of breath and wheezing.   Cardiovascular:  Negative for chest pain and leg swelling.  Gastrointestinal:  Negative for abdominal pain, heartburn and vomiting.  Genitourinary:  Negative for decreased urine volume and hematuria.  Musculoskeletal:  Negative for myalgias.  Skin:  Negative for rash.  Allergic/Immunologic: Positive for environmental allergies.  Neurological:  Positive for headaches (frontal pressure headache with coughing episodes.). Negative for syncope.  See other pertinent positives and negatives in HPI.  Current Outpatient Medications on File Prior to Visit  Medication Sig Dispense Refill   albuterol (VENTOLIN HFA) 108 (90 Base) MCG/ACT inhaler SMARTSIG:2 Inhalation Via Inhaler Every 6 Hours PRN     amLODipine (NORVASC) 5 MG tablet Take 1 tablet (5 mg total) by mouth daily. 90 tablet 3   aspirin 325 MG EC tablet Take 325 mg  by mouth every 6 (six) hours as needed for pain.     azelastine (ASTELIN) 0.1 % nasal spray Place 1 spray into both nostrils 2 (two) times daily.     azithromycin (ZITHROMAX)  250 MG tablet Sig as indicated 6 tablet 0   b complex vitamins tablet Take 1 tablet by mouth daily.     benzonatate (TESSALON) 100 MG capsule Take 1 capsule (100 mg total) by mouth 2 (two) times daily as needed for cough. 20 capsule 0   CALCIUM PO Take 1,200 mg by mouth daily.      denosumab (PROLIA) 60 MG/ML SOLN injection Inject 60 mg into the skin every 6 (six) months. Administer in upper arm, thigh, or abdomen     dextromethorphan-guaiFENesin (MUCINEX DM) 30-600 MG 12hr tablet Take 1 tablet by mouth 2 (two) times daily as needed for cough. 12 tablet 3   famotidine (PEPCID) 40 MG tablet Take 1 tablet (40 mg total) by mouth every evening. 30 tablet 0   fluticasone (FLONASE) 50 MCG/ACT nasal spray Place 2 sprays into both nostrils daily. 18.2 g 0   ipratropium (ATROVENT) 0.06 % nasal spray Place into the nose.     levothyroxine (SYNTHROID) 100 MCG tablet TAKE 1 TABLET(100 MCG) BY MOUTH DAILY 90 tablet 2   pantoprazole (PROTONIX) 40 MG tablet Take 1 tablet (40 mg total) by mouth in the morning. (Patient taking differently: Take 20 mg by mouth in the morning.) 30 tablet 0   Spacer/Aero-Holding Chambers (EASIVENT) inhaler See admin instructions.     fexofenadine (ALLEGRA) 60 MG tablet Take 1 tablet (60 mg total) by mouth daily for 15 days. 15 tablet 0   Current Facility-Administered Medications on File Prior to Visit  Medication Dose Route Frequency Provider Last Rate Last Admin   [START ON 01/05/2023] denosumab (PROLIA) injection 60 mg  60 mg Subcutaneous Once Genia Del, MD        Past Medical History:  Diagnosis Date   COVID 10/25/2020   Dog bite of thigh 04/09/2012   Elevated lipids    GERD (gastroesophageal reflux disease)    Hypertension    Hypothyroidism    post thyroidectomy   Osteoarthritis    Osteopenia    t -2.5 hip in 2005 by Dexa   Osteoporosis    commpr fx after dog related fall  injury   Seasonal rhinitis    spring and fall   Skin cancer    squamous cell ca  chest   Vaginal prolapse    hs corrected   Allergies  Allergen Reactions   Capsicum Annuum Extract & Derivative (Bell Pepper) [Capsicum]     Other reaction(s): Other (See Comments)   Milk-Related Compounds Other (See Comments)   Onion Other (See Comments)   Other Other (See Comments)    Malon Kindle    Social History   Socioeconomic History   Marital status: Widowed    Spouse name: Not on file   Number of children: 1   Years of education: Not on file   Highest education level: Associate degree: academic program  Occupational History   Occupation: retired  Tobacco Use   Smoking status: Never   Smokeless tobacco: Never  Vaping Use   Vaping Use: Never used  Substance and Sexual Activity   Alcohol use: Yes    Comment: occasionally   Drug use: No   Sexual activity: Not Currently    Partners: Male    Birth control/protection: Surgical    Comment: hysterectomy, older than  16, less than 5  Other Topics Concern   Not on file  Social History Narrative   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   Regular Exercise- yes      1 son, never smoker no alcohol 1 caffeinated beverage   Social Determinants of Health   Financial Resource Strain: Low Risk  (05/24/2022)   Overall Financial Resource Strain (CARDIA)    Difficulty of Paying Living Expenses: Not hard at all  Food Insecurity: No Food Insecurity (05/24/2022)   Hunger Vital Sign    Worried About Running Out of Food in the Last Year: Never true    Ran Out of Food in the Last Year: Never true  Transportation Needs: No Transportation Needs (05/24/2022)   PRAPARE - Administrator, Civil Service (Medical): No    Lack of Transportation (Non-Medical): No  Physical Activity: Insufficiently Active (05/24/2022)   Exercise Vital Sign    Days of Exercise per Week: 5 days    Minutes of Exercise per Session: 20 min  Stress: No Stress Concern Present (05/24/2022)   [employer] of  Occupational Health - Occupational Stress Questionnaire    Feeling of Stress : Not at all  Social Connections: Moderately Integrated (05/24/2022)   Social Connection and Isolation Panel [NHANES]    Frequency of Communication with Friends and Family: More than three times a week    Frequency of Social Gatherings with Friends and Family: Once a week    Attends Religious Services: More than 4 times per year    Active Member of [employer] or Organizations: Yes    Attends Banker Meetings: More than 4 times per year    Marital Status: Widowed   Vitals:   12/05/22 0835  BP: 126/80  Pulse: 94  Resp: 16  Temp: 97.6 F (36.4 C)  SpO2: 94%   Body mass index is 23.9 kg/m.  Physical Exam Vitals and nursing note reviewed.  Constitutional:      General: She is not in acute distress.    Appearance: She is well-developed. She is not ill-appearing.  HENT:     Head: Normocephalic and atraumatic.     Nose: Rhinorrhea present.     Mouth/Throat:     Mouth: Mucous membranes are moist.     Pharynx: Oropharynx is clear.     Comments: Post nasal drainage. Eyes:     Conjunctiva/sclera: Conjunctivae normal.  Cardiovascular:     Rate and Rhythm: Normal rate and regular rhythm.     Heart sounds: No murmur heard. Pulmonary:     Effort: Pulmonary effort is normal. No respiratory distress.     Breath sounds: Normal breath sounds. No stridor.  Lymphadenopathy:     Cervical: No cervical adenopathy.  Skin:    General: Skin is warm.     Findings: No erythema or rash.  Neurological:     Mental Status: She is alert and oriented to person, place, and time.     Comments: Stable gait, not assisted.  Psychiatric:        Mood and Affect: Mood and affect normal.    ASSESSMENT AND PLAN: Ms. Fatemi was seen today for chronic respiratory symptoms.  Chronic cough We discussed possible etiologies. Some of her chronic medical problems can be contributing factors. She has been treated with  abx,just completed Azithromycin. Lung auscultation negative. We decided to hold on imaging. Albuterol inh seems to help. She agrees with trying Symbicort 80-4.5 mcg  2 puff bid. Some side effects discussed, she is to swish after use.  Atelectasis seen on chest CT, so recommend incentive spirometry, she has device at home. F/U in 6 weeks with PCP.  Other orders -     Budesonide-Formoterol Fumarate; Inhale 2 puffs into the lungs 2 (two) times daily.  Dispense: 1 each; Refill: 2  Labile hypertension BP today adequately controlled. Recommend taking Amlodipine 5 mg daily. Low salt diet. Monitor BP and bring readings with her next visit.  Return in about 2 months (around 02/04/2023) for chronic cough with PCP.  Donda G. [place], MD  [facility]. [place] office.

## 2022-12-06 ENCOUNTER — Ambulatory Visit: Payer: Medicare Other | Admitting: Adult Health

## 2022-12-09 DIAGNOSIS — R293 Abnormal posture: Secondary | ICD-10-CM | POA: Diagnosis not present

## 2022-12-16 DIAGNOSIS — J310 Chronic rhinitis: Secondary | ICD-10-CM | POA: Diagnosis not present

## 2022-12-16 DIAGNOSIS — R490 Dysphonia: Secondary | ICD-10-CM | POA: Diagnosis not present

## 2022-12-16 DIAGNOSIS — K219 Gastro-esophageal reflux disease without esophagitis: Secondary | ICD-10-CM | POA: Diagnosis not present

## 2022-12-19 DIAGNOSIS — J984 Other disorders of lung: Secondary | ICD-10-CM | POA: Diagnosis not present

## 2022-12-19 DIAGNOSIS — R0609 Other forms of dyspnea: Secondary | ICD-10-CM | POA: Diagnosis not present

## 2022-12-20 ENCOUNTER — Emergency Department (HOSPITAL_COMMUNITY): Payer: Medicare Other

## 2022-12-20 ENCOUNTER — Emergency Department (HOSPITAL_COMMUNITY)
Admission: EM | Admit: 2022-12-20 | Discharge: 2022-12-20 | Disposition: A | Discharge status: Home / Self Care | Payer: Medicare Other | Attending: Student | Admitting: Student

## 2022-12-20 DIAGNOSIS — M25561 Pain in right knee: Secondary | ICD-10-CM | POA: Diagnosis not present

## 2022-12-20 DIAGNOSIS — Z7982 Long term (current) use of aspirin: Secondary | ICD-10-CM | POA: Diagnosis not present

## 2022-12-20 DIAGNOSIS — Z85828 Personal history of other malignant neoplasm of skin: Secondary | ICD-10-CM | POA: Diagnosis not present

## 2022-12-20 DIAGNOSIS — R0989 Other specified symptoms and signs involving the circulatory and respiratory systems: Secondary | ICD-10-CM | POA: Diagnosis not present

## 2022-12-20 DIAGNOSIS — S8991XA Unspecified injury of right lower leg, initial encounter: Secondary | ICD-10-CM | POA: Diagnosis not present

## 2022-12-20 DIAGNOSIS — W19XXXA Unspecified fall, initial encounter: Secondary | ICD-10-CM

## 2022-12-20 DIAGNOSIS — Y92242 Post office as the place of occurrence of the external cause: Secondary | ICD-10-CM | POA: Diagnosis not present

## 2022-12-20 DIAGNOSIS — Z23 Encounter for immunization: Secondary | ICD-10-CM | POA: Diagnosis not present

## 2022-12-20 DIAGNOSIS — M25521 Pain in right elbow: Secondary | ICD-10-CM | POA: Insufficient documentation

## 2022-12-20 DIAGNOSIS — T148XXA Other injury of unspecified body region, initial encounter: Secondary | ICD-10-CM

## 2022-12-20 DIAGNOSIS — S80211A Abrasion, right knee, initial encounter: Secondary | ICD-10-CM | POA: Diagnosis not present

## 2022-12-20 DIAGNOSIS — S50311A Abrasion of right elbow, initial encounter: Secondary | ICD-10-CM | POA: Diagnosis not present

## 2022-12-20 DIAGNOSIS — S0990XA Unspecified injury of head, initial encounter: Secondary | ICD-10-CM | POA: Diagnosis not present

## 2022-12-20 DIAGNOSIS — E039 Hypothyroidism, unspecified: Secondary | ICD-10-CM | POA: Insufficient documentation

## 2022-12-20 DIAGNOSIS — Z79899 Other long term (current) drug therapy: Secondary | ICD-10-CM | POA: Diagnosis not present

## 2022-12-20 DIAGNOSIS — M47812 Spondylosis without myelopathy or radiculopathy, cervical region: Secondary | ICD-10-CM | POA: Diagnosis not present

## 2022-12-20 DIAGNOSIS — R918 Other nonspecific abnormal finding of lung field: Secondary | ICD-10-CM | POA: Diagnosis not present

## 2022-12-20 DIAGNOSIS — Z8616 Personal history of COVID-19: Secondary | ICD-10-CM | POA: Diagnosis not present

## 2022-12-20 DIAGNOSIS — I10 Essential (primary) hypertension: Secondary | ICD-10-CM | POA: Diagnosis not present

## 2022-12-20 DIAGNOSIS — Z043 Encounter for examination and observation following other accident: Secondary | ICD-10-CM | POA: Diagnosis not present

## 2022-12-20 DIAGNOSIS — W0110XA Fall on same level from slipping, tripping and stumbling with subsequent striking against unspecified object, initial encounter: Secondary | ICD-10-CM | POA: Diagnosis not present

## 2022-12-20 DIAGNOSIS — J984 Other disorders of lung: Secondary | ICD-10-CM | POA: Diagnosis not present

## 2022-12-20 MED ORDER — TETANUS-DIPHTH-ACELL PERTUSSIS 5-2.5-18.5 LF-MCG/0.5 IM SUSY
0.5000 mL | PREFILLED_SYRINGE | Freq: Once | INTRAMUSCULAR | Status: AC
  Administered 2022-12-20: 0.5 mL via INTRAMUSCULAR
  Filled 2022-12-20: qty 0.5

## 2022-12-20 NOTE — ED Triage Notes (Signed)
[place] post [place], hitting head, right knee, and right elbow.

## 2022-12-20 NOTE — ED Provider Notes (Signed)
[place] EMERGENCY DEPARTMENT AT [facility] Provider Note  CSN: [id number] Arrival date & time: 12/20/22 1620  Chief Complaint(s) Fall (Knee/), Knee Pain, and Elbow Pain  HPI [name] [name] is a 82 y.o. female who presents emergency room for evaluation of a mechanical fall.  Patient states that she was leaving the post office when she tripped on a curb and landed on her right side striking her right elbow, knee and face on the ground.  She does not take blood thinners and has had no loss of consciousness.  Patient arrives with an abrasion to the right knee but has full range of motion of all of extremities.  Denies numbness, tingling, weakness or other neurologic complaints.  Denies chest pain, shortness of breath, Donnell pain, nausea, vomiting or other systemic or traumatic complaints.   Past Medical History Past Medical History:  Diagnosis Date   COVID 10/25/2020   Dog bite of thigh 04/09/2012   Elevated lipids    GERD (gastroesophageal reflux disease)    Hypertension    Hypothyroidism    post thyroidectomy   Osteoarthritis    Osteopenia    t -2.5 hip in 2005 by Dexa   Osteoporosis    commpr fx after dog related fall  injury   Seasonal rhinitis    spring and fall   Skin cancer    squamous cell ca chest   Vaginal prolapse    hs corrected   Patient Active Problem List   Diagnosis Date Noted   Acute cough 07/14/2022   Hoarseness of voice 07/14/2022   GERD (gastroesophageal reflux disease) 07/14/2022   Medication management 10/16/2013   Medicare annual wellness visit, subsequent 07/16/2012   Heart palpitations 05-17-11   Family history of sudden death May 17, 2011   PALPITATIONS 03/31/2010   BACK PAIN, LUMBAR 10/13/2009   HEARTBURN 10/13/2009   FOOT PAIN 03/31/2008   Hypothyroidism 01/25/2007   Osteoporosis 01/25/2007   OSTEOPENIA 01/25/2007   Home Medication(s) Prior to Admission medications   Medication Sig Start Date End Date Taking? Authorizing  Provider  albuterol (VENTOLIN HFA) 108 (90 Base) MCG/ACT inhaler SMARTSIG:2 Inhalation Via Inhaler Every 6 Hours PRN 11/10/22   [provider]  amLODipine (NORVASC) 5 MG tablet Take 1 tablet (5 mg total) by mouth daily. 05/13/22   Maisie Fus, MD  aspirin 325 MG EC tablet Take 325 mg by mouth every 6 (six) hours as needed for pain.    [provider]  azelastine (ASTELIN) 0.1 % nasal spray Place 1 spray into both nostrils 2 (two) times daily. 03/15/22   [provider]  azithromycin (ZITHROMAX) 250 MG tablet Sig as indicated 11/28/22   Georgina Quint, MD  b complex vitamins tablet Take 1 tablet by mouth daily.    [provider]  benzonatate (TESSALON) 100 MG capsule Take 1 capsule (100 mg total) by mouth 2 (two) times daily as needed for cough. 11/28/22   Georgina Quint, MD  budesonide-formoterol [facility]) 80-4.5 MCG/ACT inhaler Inhale 2 puffs into the lungs 2 (two) times daily. 12/05/22   [place], Makayah G, MD  CALCIUM PO Take 1,200 mg by mouth daily.     [provider]  denosumab (PROLIA) 60 MG/ML SOLN injection Inject 60 mg into the skin every 6 (six) months. Administer in upper arm, thigh, or abdomen    [provider]  dextromethorphan-guaiFENesin (MUCINEX DM) 30-600 MG 12hr tablet Take 1 tablet by mouth 2 (two) times daily as needed for cough. 11/28/22  Georgina Quint, MD  famotidine (PEPCID) 40 MG tablet Take 1 tablet (40 mg total) by mouth every evening. 07/14/22   Nelwyn Salisbury, MD  fexofenadine (ALLEGRA) 60 MG tablet Take 1 tablet (60 mg total) by mouth daily for 15 days. 09/13/22 09/28/22  Trevor Iha, FNP  fluticasone (FLONASE) 50 MCG/ACT nasal spray Place 2 sprays into both nostrils daily. 08/16/22   Rodriguez-Southworth, Nettie Elm, PA-C  ipratropium (ATROVENT) 0.06 % nasal spray Place into the nose. 11/15/22 11/15/23  [provider]  levothyroxine (SYNTHROID) 100 MCG tablet TAKE 1 TABLET(100 MCG) BY MOUTH DAILY  08/07/22   Worthy Rancher B, FNP  pantoprazole (PROTONIX) 40 MG tablet Take 1 tablet (40 mg total) by mouth in the morning. Patient taking differently: Take 20 mg by mouth in the morning. 07/14/22   Nelwyn Salisbury, MD  Spacer/Aero-Holding Deretha Emory (EASIVENT) inhaler See admin instructions. 04/22/22 04/22/23  [provider]                                                                                                                                    Past Surgical History Past Surgical History:  Procedure Laterality Date   ABDOMINAL HYSTERECTOMY  07/2007   bladders sling surgery  07/2007   COLONOSCOPY  2006   THYROIDECTOMY     Family History Family History  Problem Relation Age of Onset   Heart attack Mother    Alcohol abuse Mother    Kidney cancer Father    Hypertension Maternal Grandmother    Esophageal cancer Neg Hx    Colon cancer Neg Hx    Pancreatic cancer Neg Hx    Stomach cancer Neg Hx     Social History Social History   Tobacco Use   Smoking status: Never   Smokeless tobacco: Never  Vaping Use   Vaping Use: Never used  Substance Use Topics   Alcohol use: Yes    Comment: occasionally   Drug use: No   Allergies Capsicum annuum extract & derivative (bell pepper) [capsicum], Milk-related compounds, Onion, and Other  Review of Systems Review of Systems  Musculoskeletal:  Positive for arthralgias and myalgias.  Skin:  Positive for wound.    Physical Exam Vital Signs  I have reviewed the triage vital signs BP 125/82 (BP Location: Right Arm)   Pulse 89   Temp 98 F (36.7 C) (Oral)   Resp 17   SpO2 95%   Physical Exam Vitals and nursing note reviewed.  Constitutional:      General: She is not in acute distress.    Appearance: She is well-developed.  HENT:     Head: Normocephalic and atraumatic.  Eyes:     Conjunctiva/sclera: Conjunctivae normal.  Cardiovascular:     Rate and Rhythm: Normal rate and regular rhythm.     Heart sounds: No murmur  heard. Pulmonary:     Effort: Pulmonary effort is normal. No respiratory distress.  Breath sounds: Normal breath sounds.  Abdominal:     Palpations: Abdomen is soft.     Tenderness: There is no abdominal tenderness.  Musculoskeletal:        General: Tenderness present. No swelling.     Cervical back: Neck supple.  Skin:    General: Skin is warm and dry.     Capillary Refill: Capillary refill takes less than 2 seconds.  Neurological:     Mental Status: She is alert.  Psychiatric:        Mood and Affect: Mood normal.     ED Results and Treatments Labs (all labs ordered are listed, but only abnormal results are displayed) Labs Reviewed - No data to display                                                                                                                        Radiology CT CERVICAL SPINE WO CONTRAST  Result Date: 12/20/2022 CLINICAL DATA:  Larey Seat off curb, hit head EXAM: CT CERVICAL SPINE WITHOUT CONTRAST TECHNIQUE: Multidetector CT imaging of the cervical spine was performed without intravenous contrast. Multiplanar CT image reconstructions were also generated. RADIATION DOSE REDUCTION: This exam was performed according to the departmental dose-optimization program which includes automated exposure control, adjustment of the mA and/or kV according to patient size and/or use of iterative reconstruction technique. COMPARISON:  None Available. FINDINGS: Alignment: Alignment is anatomic. Skull base and vertebrae: No acute fracture. No primary bone lesion or focal pathologic process. Soft tissues and spinal canal: No prevertebral fluid or swelling. No visible canal hematoma. Disc levels: Multilevel spondylosis is identified, most pronounced at the C4-5, C5-6, T1-2, and T2-3 levels. Mild diffuse facet hypertrophy. Upper chest: Airway is patent. Lung apices are clear. Incidental benign-appearing expansile lucency of the right posterior fourth rib may reflect fibrous dysplasia. Other:  Reconstructed images demonstrate no additional findings. IMPRESSION: 1. No acute cervical spine fracture. 2. Extensive multilevel cervical degenerative changes as above. Electronically Signed   By: Sharlet Salina M.D.   On: 12/20/2022 17:43   CT Head Wo Contrast  Result Date: 12/20/2022 CLINICAL DATA:  Larey Seat off curb, hit head EXAM: CT HEAD WITHOUT CONTRAST TECHNIQUE: Contiguous axial images were obtained from the base of the skull through the vertex without intravenous contrast. RADIATION DOSE REDUCTION: This exam was performed according to the departmental dose-optimization program which includes automated exposure control, adjustment of the mA and/or kV according to patient size and/or use of iterative reconstruction technique. COMPARISON:  None Available. FINDINGS: Brain: No acute infarct or hemorrhage. Lateral ventricles and midline structures are unremarkable. No acute extra-axial fluid collections. No mass effect. Vascular: No hyperdense vessel or unexpected calcification. Skull: Normal. Negative for fracture or focal lesion. Sinuses/Orbits: Small gas fluid level in the right sphenoid sinus. Remaining paranasal sinuses are clear. Other: None. IMPRESSION: 1. No acute intracranial trauma. Electronically Signed   By: Sharlet Salina M.D.   On: 12/20/2022 17:38    Pertinent labs & imaging results that  were available during my care of the patient were reviewed by me and considered in my medical decision making (see MDM for details).  Medications Ordered in ED Medications - No data to display                                                                                                                                   Procedures Procedures  (including critical care time)  Medical Decision Making / ED Course   This patient presents to the ED for concern of fall, this involves an extensive number of treatment options, and is a complaint that carries with it a high risk of complications and  morbidity.  The differential diagnosis includes fracture, closed head injury, ICH, hematoma, contusion, hematoma, abrasion  MDM: Patient seen emergency room for evaluation of a fall.  Physical exam with tenderness over the right elbow, right knee.  Trauma imaging including CT head, C-spine, chest x-ray, elbow x-ray, knee x-ray reassuringly negative for acute traumatic injury.  Tetanus updated here in the emergency room and and with negative trauma workup she is safe for discharge with outpatient follow-up.  She currently does not meet inpatient criteria for admission.   Additional history obtained: -Additional history obtained from son -External records from outside source obtained and reviewed including: Chart review including previous notes, labs, imaging, consultation notes   Imaging Studies ordered: I ordered imaging studies including CT head, C-spine, chest x-ray, elbow x-ray, knee x-ray I independently visualized and interpreted imaging. I agree with the radiologist interpretation   Medicines ordered and prescription drug management: No orders of the defined types were placed in this encounter.   -I have reviewed the patients home medicines and have made adjustments as needed  Critical interventions none    Cardiac Monitoring: The patient was maintained on a cardiac monitor.  I personally viewed and interpreted the cardiac monitored which showed an underlying rhythm of: NSR  Social Determinants of Health:  Factors impacting patients care include: none   Reevaluation: After the interventions noted above, I reevaluated the patient and found that they have :improved  Co morbidities that complicate the patient evaluation  Past Medical History:  Diagnosis Date   COVID 10/25/2020   Dog bite of thigh 04/09/2012   Elevated lipids    GERD (gastroesophageal reflux disease)    Hypertension    Hypothyroidism    post thyroidectomy   Osteoarthritis    Osteopenia    t -2.5  hip in 2005 by Dexa   Osteoporosis    commpr fx after dog related fall  injury   Seasonal rhinitis    spring and fall   Skin cancer    squamous cell ca chest   Vaginal prolapse    hs corrected      Dispostion: I considered admission for this patient, but at this time she does not meet inpatient criteria for admission she is safe for discharge with outpatient  follow-up     Final Clinical Impression(s) / ED Diagnoses Final diagnoses:  None     @PCDICTATION @    Glendora Score, MD 12/20/22 [phone]

## 2022-12-23 ENCOUNTER — Telehealth: Payer: Self-pay

## 2022-12-23 ENCOUNTER — Telehealth (HOSPITAL_COMMUNITY): Payer: Self-pay

## 2022-12-23 NOTE — Transitions of Care (Post Inpatient/ED Visit) (Signed)
12/23/2022  Name: [name] [name] MRN: [id number] DOB: [birth year]  Today's TOC FU Call Status: Today's TOC FU Call Status:: Successful TOC FU Call Competed TOC FU Call Complete Date: 12/23/22   Red on EMMI-ED Discharge Alert Date & Reason:12/22/22 "Scheduled follow-up appt? No"  Transition Care Management Follow-up Telephone Call Date of Discharge: 12/20/22 Discharge Facility: [facility]) Type of Discharge: Emergency Department Reason for ED Visit: Other: ("fall") How have you been since you were released from the hospital?: Better (Pt voices that she is doing better-being careful with walking. She denies using any assistive devices. Pt. reports some mild bruises to knee and elbow but otherwise okay from recent fall.) Any questions or concerns?: No  Items Reviewed: Did you receive and understand the discharge instructions provided?: Yes Medications obtained,verified, and reconciled?: Partial Review Completed Reason for Partial Mediation Review: pt currently driving-not at home with meds Any new allergies since your discharge?: No Dietary orders reviewed?: Yes Type of Diet Ordered:: low salt/heart healthy Do you have support at home?: Yes People in Home: child(ren), adult, alone Name of Support/Comfort Primary Source: Joe-son lives nearby and checks on her  Medications Reviewed Today: Medications Reviewed Today     Reviewed by Charlyn Minerva, RN (Registered Nurse) on 12/23/22 at 1241  Med List Status: <None>   Medication Order Taking? Sig Documenting Provider Last Dose Status Informant  albuterol (VENTOLIN HFA) 108 (90 Base) MCG/ACT inhaler [id number] No SMARTSIG:2 Inhalation Via Inhaler Every 6 Hours PRN [provider] Taking Active   amLODipine (NORVASC) 5 MG tablet [id number] No Take 1 tablet (5 mg total) by mouth daily. Maisie Fus, MD Taking Active   aspirin 325 MG EC tablet [id number] No Take 325 mg by mouth every 6 (six) hours as needed for  pain. [provider] Taking Active   azelastine (ASTELIN) 0.1 % nasal spray [id number] No Place 1 spray into both nostrils 2 (two) times daily. [provider] Taking Active   azithromycin (ZITHROMAX) 250 MG tablet [id number] No Sig as indicated Georgina Quint, MD Taking Active   b complex vitamins tablet [id number] No Take 1 tablet by mouth daily. [provider] Taking Active   benzonatate (TESSALON) 100 MG capsule [id number] No Take 1 capsule (100 mg total) by mouth 2 (two) times daily as needed for cough. Georgina Quint, MD Taking Active   budesonide-formoterol [facility]) 80-4.5 MCG/ACT inhaler [id number]  Inhale 2 puffs into the lungs 2 (two) times daily. [place], Kayce G, MD  Active   CALCIUM PO [id number] No Take 1,200 mg by mouth daily.  [provider] Taking Active   denosumab (PROLIA) 60 MG/ML SOLN injection [id number] No Inject 60 mg into the skin every 6 (six) months. Administer in upper arm, thigh, or abdomen [provider] Taking Active   denosumab (PROLIA) injection 60 mg [id number]   Genia Del, MD  Active   dextromethorphan-guaiFENesin [facility] DM) 30-600 MG 12hr tablet [id number] No Take 1 tablet by mouth 2 (two) times daily as needed for cough. Georgina Quint, MD Taking Active   famotidine (PEPCID) 40 MG tablet [id number] No Take 1 tablet (40 mg total) by mouth every evening. Nelwyn Salisbury, MD Taking Active   fexofenadine (ALLEGRA) 60 MG tablet [id number]  Take 1 tablet (60 mg total) by mouth daily for 15 days. Trevor Iha, FNP  Expired 09/28/22 2359   fluticasone (FLONASE) 50 MCG/ACT nasal spray [id number] No Place 2 sprays into both nostrils daily. Rodriguez-Southworth, Nettie Elm, PA-C Taking Active  ipratropium (ATROVENT) 0.06 % nasal spray [id number] No Place into the nose. [provider] Taking Active   levothyroxine (SYNTHROID) 100 MCG tablet [id number] No TAKE 1 TABLET(100 MCG) BY MOUTH DAILY Worthy Rancher B, FNP Taking Active   pantoprazole (PROTONIX) 40 MG tablet [id number] No Take 1 tablet (40 mg total) by mouth in the morning.  Patient taking differently: Take 20 mg by mouth in the morning.   Nelwyn Salisbury, MD Taking Active   Spacer/Aero-Holding Chambers (EASIVENT) inhaler [id number] No See admin instructions. [provider] Taking Active             Home Care and Equipment/Supplies: Were Home Health Services Ordered?: NA Any new equipment or medical supplies ordered?: NA  Functional Questionnaire: Do you need assistance with bathing/showering or dressing?: No Do you need assistance with meal preparation?: No Do you need assistance with eating?: No Do you have difficulty maintaining continence: No Do you need assistance with getting out of bed/getting out of a chair/moving?: No Do you have difficulty managing or taking your medications?: No  Follow up appointments reviewed: PCP Follow-up appointment confirmed?: Yes Date of PCP follow-up appointment?: 01/04/23 Follow-up Provider: Dr. Fabian Sharp Specialist [facility] Follow-up appointment confirmed?: NA Do you need transportation to your follow-up appointment?:  (pt confirms she drives herself to appts) Do you understand care options if your condition(s) worsen?: Yes-patient verbalized understanding  SDOH Interventions Today    Flowsheet Row Most Recent Value  SDOH Interventions   Food Insecurity Interventions Intervention Not Indicated  Transportation Interventions Intervention Not Indicated      TOC Interventions Today    Flowsheet Row Most Recent Value  TOC Interventions   TOC Interventions Discussed/Reviewed TOC Interventions Discussed, Arranged PCP follow up within 7 days/Care Guide scheduled      Interventions Today    Flowsheet Row Most Recent Value  General Interventions   General Interventions Discussed/Reviewed General Interventions Discussed, Doctor Visits  Doctor Visits Discussed/Reviewed  Doctor Visits Discussed, PCP, Specialist  PCP/Specialist Visits Compliance with follow-up visit  Exercise Interventions   Exercise Discussed/Reviewed Exercise Discussed  [pt voices that she rides stationary bike 3x/wk for 1mins-she has been set up with pulmonary rehab-will begin next week]  Education Interventions   Education Provided Provided Education  Provided Verbal Education On Nutrition, When to see the doctor  Nutrition Interventions   Nutrition Discussed/Reviewed Nutrition Discussed, Adding fruits and vegetables, Decreasing salt, Supplemental nutrition  Pharmacy Interventions   Pharmacy Dicussed/Reviewed Pharmacy Topics Discussed, Medications and their functions  Safety Interventions   Safety Discussed/Reviewed Safety Discussed, Fall Risk, Home Safety  Home Safety Assistive Devices       [place], [facility] Care Management Care Management Community Coordinator Direct Phone: [phone] Toll Free: [phone] Fax: [phone]

## 2022-12-23 NOTE — Telephone Encounter (Signed)
Received referral from Dr. Uvaldo Rising ([facility]) for this pt to participate in Pulmonary Rehab with the diagnosis of other disorders of the lung. Clinical review of pt follow up appt on 6/24 Pulmonary office note. Pt appropriate for scheduling for Pulmonary rehab. Will forward to support staff for scheduling and verification of insurance eligibility/benefits with pt consent.   Essie Hart  Cardiac and Pulmonary Rehab

## 2022-12-28 ENCOUNTER — Encounter (HOSPITAL_COMMUNITY): Payer: Self-pay

## 2022-12-28 ENCOUNTER — Encounter (HOSPITAL_COMMUNITY)
Admission: RE | Admit: 2022-12-28 | Discharge: 2022-12-28 | Disposition: A | Discharge status: Home / Self Care | Payer: Medicare Other | Source: Ambulatory Visit | Attending: Pulmonary Disease | Admitting: Pulmonary Disease

## 2022-12-28 VITALS — BP 122/76 | HR 89 | Ht 60.0 in | Wt 121.5 lb

## 2022-12-28 DIAGNOSIS — J984 Other disorders of lung: Secondary | ICD-10-CM | POA: Diagnosis not present

## 2022-12-28 NOTE — Progress Notes (Signed)
[name] [name] 82 y.o. female Pulmonary Rehab Orientation Note This patient who was referred to Pulmonary Rehab by Dr. Uvaldo Rising Ever[place] All coverage) with the diagnosis of Other disorders of lung arrived today in Cardiac and Pulmonary Rehab. She  arrived ambulatory with normal gait. She  does not carry portable oxygen. Per patient, [name] [name] uses oxygen never. Color good, skin warm and dry. Patient is oriented to time and place. Patient's medical history, psychosocial health, and medications reviewed. Psychosocial assessment reveals patient lives with alone. [name] [name] is currently retired. Patient hobbies include playing dominos and bridge, spending time with others, and walking her dogs. Patient reports her stress level is low. Areas of stress/anxiety include health. Patient does not exhibit signs of depression. PHQ2/9 score 0/3. [name] [name] shows good  coping skills with positive outlook on life. Offered emotional support and reassurance. Will continue to monitor. Physical assessment performed by Essie Hart RN. Please see their orientation physical assessment note. [name] [name] reports she does take medications as prescribed. Patient states she follows a regular  diet. The patient has been trying to gain weight by increasing protein intake. Patient's weight will be monitored closely. Demonstration and practice of PLB using pulse oximeter. [name] [name] able to return demonstration satisfactorily. Safety and hand hygiene in the exercise area reviewed with patient. [name] [name] voices understanding of the information reviewed. Department expectations discussed with patient and achievable goals were set. The patient shows enthusiasm about attending the program and we look forward to working with [name] [name]. [name] [name] completed a 6 min walk test today and is scheduled to begin exercise on 01/03/23 at 10:15 am.   [id number] [name] San, MS, ACSM-CEP

## 2022-12-28 NOTE — Progress Notes (Signed)
Pulmonary Individual Treatment Plan  Patient Details  Name: [name] [name] MRN: [id number] Date of Birth: [birth year] Referring Provider:   Doristine Devoid Pulmonary Rehab Walk Test from 12/28/2022 in [facility]  Referring Provider McNeil  [Ellison]       Initial Encounter Date:  Flowsheet Row Pulmonary Rehab Walk Test from 12/28/2022 in [facility]  Date 12/28/22       Visit Diagnosis: Other disorders of lung  Patient's Home Medications on Admission:   Current Outpatient Medications:    albuterol (VENTOLIN HFA) 108 (90 Base) MCG/ACT inhaler, SMARTSIG:2 Inhalation Via Inhaler Every 6 Hours PRN, Disp: , Rfl:    amLODipine (NORVASC) 5 MG tablet, Take 1 tablet (5 mg total) by mouth daily., Disp: 90 tablet, Rfl: 3   aspirin 325 MG EC tablet, Take 325 mg by mouth every 6 (six) hours as needed for pain., Disp: , Rfl:    azithromycin (ZITHROMAX) 250 MG tablet, Sig as indicated, Disp: 6 tablet, Rfl: 0   b complex vitamins tablet, Take 1 tablet by mouth daily., Disp: , Rfl:    budesonide-formoterol (SYMBICORT) 80-4.5 MCG/ACT inhaler, Inhale 2 puffs into the lungs 2 (two) times daily., Disp: 1 each, Rfl: 2   CALCIUM PO, Take 1,200 mg by mouth daily. , Disp: , Rfl:    denosumab (PROLIA) 60 MG/ML SOLN injection, Inject 60 mg into the skin every 6 (six) months. Administer in upper arm, thigh, or abdomen, Disp: , Rfl:    dextromethorphan-guaiFENesin (MUCINEX DM) 30-600 MG 12hr tablet, Take 1 tablet by mouth 2 (two) times daily as needed for cough., Disp: 12 tablet, Rfl: 3   fluticasone (FLONASE) 50 MCG/ACT nasal spray, Place 2 sprays into both nostrils daily., Disp: 18.2 g, Rfl: 0   levothyroxine (SYNTHROID) 100 MCG tablet, TAKE 1 TABLET(100 MCG) BY MOUTH DAILY, Disp: 90 tablet, Rfl: 2   pantoprazole (PROTONIX) 40 MG tablet, Take 1 tablet (40 mg total) by mouth in the morning. (Patient  taking differently: Take 20 mg by mouth in the morning.), Disp: 30 tablet, Rfl: 0   Spacer/Aero-Holding Chambers (EASIVENT) inhaler, See admin instructions., Disp: , Rfl:    azelastine (ASTELIN) 0.1 % nasal spray, Place 1 spray into both nostrils 2 (two) times daily. (Patient not taking: Reported on 12/28/2022), Disp: , Rfl:    benzonatate (TESSALON) 100 MG capsule, Take 1 capsule (100 mg total) by mouth 2 (two) times daily as needed for cough. (Patient not taking: Reported on 12/28/2022), Disp: 20 capsule, Rfl: 0   famotidine (PEPCID) 40 MG tablet, Take 1 tablet (40 mg total) by mouth every evening. (Patient not taking: Reported on 12/28/2022), Disp: 30 tablet, Rfl: 0   fexofenadine (ALLEGRA) 60 MG tablet, Take 1 tablet (60 mg total) by mouth daily for 15 days., Disp: 15 tablet, Rfl: 0   ipratropium (ATROVENT) 0.06 % nasal spray, Place into the nose. (Patient not taking: Reported on 12/28/2022), Disp: , Rfl:   Current Facility-Administered Medications:    [START ON 01/05/2023] denosumab (PROLIA) injection 60 mg, 60 mg, Subcutaneous, Once, Genia Del, MD  Past Medical History: Past Medical History:  Diagnosis Date   COVID 10/25/2020   Dog bite of thigh 04/09/2012   Elevated lipids    GERD (gastroesophageal reflux disease)    Hypertension    Hypothyroidism    post thyroidectomy   Osteoarthritis    Osteopenia    t -2.5 hip in 2005  by Dexa   Osteoporosis    commpr fx after dog related fall  injury   Seasonal rhinitis    spring and fall   Skin cancer    squamous cell ca chest   Vaginal prolapse    hs corrected    Tobacco Use: Social History   Tobacco Use  Smoking Status Never  Smokeless Tobacco Never    Labs: Review Flowsheet  More data exists      Latest Ref Rng & Units 05/11/2018 05/14/2019 09/23/2020 08/30/2021 07/20/2022  Labs for ITP Cardiac and Pulmonary Rehab  Cholestrol 0 - 200 mg/dL 604  540  981  191  478   LDL (calc) 0 - 99 mg/dL 295  621  308  657  846   HDL-C  >39.00 mg/dL 96.29  52.84  13.24  40.10  68.10   Trlycerides 0.0 - 149.0 mg/dL 27.2  53.6  64.4  034.7  121.0   Hemoglobin A1c 4.6 - 6.5 % - - 5.8  - -    Capillary Blood Glucose: No results found for: "GLUCAP"   Pulmonary Assessment Scores:  Pulmonary Assessment Scores     Row Name 12/28/22 1058         ADL UCSD   ADL Phase Entry     SOB Score total 11       CAT Score   CAT Score 7       mMRC Score   mMRC Score 3             UCSD: Self-administered rating of dyspnea associated with activities of daily living (ADLs) 6-point scale (0 = "not at all" to 5 = "maximal or unable to do because of breathlessness")  Scoring Scores range from 0 to 120.  Minimally important difference is 5 units  CAT: CAT can identify the health impairment of COPD patients and is better correlated with disease progression.  CAT has a scoring range of zero to 40. The CAT score is classified into four groups of low (less than 10), medium (10 - 20), high (21-30) and very high (31-40) based on the impact level of disease on health status. A CAT score over 10 suggests significant symptoms.  A worsening CAT score could be explained by an exacerbation, poor medication adherence, poor inhaler technique, or progression of COPD or comorbid conditions.  CAT MCID is 2 points  mMRC: mMRC (Modified Medical Research Council) Dyspnea Scale is used to assess the degree of baseline functional disability in patients of respiratory disease due to dyspnea. No minimal important difference is established. A decrease in score of 1 point or greater is considered a positive change.   Pulmonary Function Assessment:  Pulmonary Function Assessment - 12/28/22 1132       Breath   Bilateral Breath Sounds --             Exercise Target Goals: Exercise Program Goal: Individual exercise prescription set using results from initial 6 min walk test and THRR while considering  patient's activity barriers and safety.    Exercise Prescription Goal: Initial exercise prescription builds to 30-45 minutes a day of aerobic activity, 2-3 days per week.  Home exercise guidelines will be given to patient during program as part of exercise prescription that the participant will acknowledge.  Activity Barriers & Risk Stratification:  Activity Barriers & Cardiac Risk Stratification - 12/28/22 1102       Activity Barriers & Cardiac Risk Stratification   Activity Barriers Deconditioning;Muscular Weakness;Shortness of Breath;History of  Falls;Arthritis;Neck/Spine Problems             6 Minute Walk:  6 Minute Walk     Row Name 12/28/22 1203         6 Minute Walk   Phase Initial     Distance 610 feet     Walk Time 6 minutes     # of Rest Breaks 4  4 rest breaks at: 1:46-2:07, 2:49-3:05, 3:49-4:32, 5:18-5:42     MPH 1.16     METS 1.63     RPE 13     Perceived Dyspnea  3     VO2 Peak 5.69     Symptoms Yes (comment)     Comments dyspnea     Resting HR 89 bpm     Resting BP 122/76     Resting Oxygen Saturation  98 %     Exercise Oxygen Saturation  during 6 min walk 86 %     Max Ex. HR 141 bpm     Max Ex. BP 140/82     2 Minute Post BP 122/78       Interval HR   1 Minute HR 114     2 Minute HR 116     3 Minute HR 129     4 Minute HR 141     5 Minute HR 135     6 Minute HR 130     2 Minute Post HR 103     Interval Heart Rate? Yes       Interval Oxygen   Interval Oxygen? Yes     Baseline Oxygen Saturation % 98 %     1 Minute Oxygen Saturation % 86 %  at 1:46 O2 dropped to 86%, PLB right up to 91%     1 Minute Liters of Oxygen 0 L     2 Minute Oxygen Saturation % 95 %     2 Minute Liters of Oxygen 0 L     3 Minute Oxygen Saturation % 86 %  2:49 O2 dropped to 86%, PLB up to 91%     3 Minute Liters of Oxygen 0 L     4 Minute Oxygen Saturation % 94 %     4 Minute Liters of Oxygen 0 L     5 Minute Oxygen Saturation % 92 %     5 Minute Liters of Oxygen 0 L     6 Minute Oxygen Saturation % 90 %      6 Minute Liters of Oxygen 0 L     2 Minute Post Oxygen Saturation % 94 %     2 Minute Post Liters of Oxygen 0 L              Oxygen Initial Assessment:  Oxygen Initial Assessment - 12/28/22 1058       Home Oxygen   Home Oxygen Device None    Sleep Oxygen Prescription None    Home Exercise Oxygen Prescription None    Home Resting Oxygen Prescription None      Initial 6 min Walk   Oxygen Used None      Program Oxygen Prescription   Program Oxygen Prescription None      Intervention   Short Term Goals To learn and understand importance of maintaining oxygen saturations>88%;To learn and demonstrate proper use of respiratory medications;To learn and understand importance of monitoring SPO2 with pulse oximeter and demonstrate accurate use of the pulse oximeter.;To learn and demonstrate proper pursed  lip breathing techniques or other breathing techniques.     Long  Term Goals Verbalizes importance of monitoring SPO2 with pulse oximeter and return demonstration;Maintenance of O2 saturations>88%;Exhibits proper breathing techniques, such as pursed lip breathing or other method taught during program session;Compliance with respiratory medication             Oxygen Re-Evaluation:   Oxygen Discharge (Final Oxygen Re-Evaluation):   Initial Exercise Prescription:  Initial Exercise Prescription - 12/28/22 1200       Date of Initial Exercise RX and Referring Provider   Date 12/28/22    Referring Provider [name]   Expected Discharge Date 03/23/23      Recumbant Bike   Level 1    RPM 30    Minutes 15    METs 1.5      NuStep   Level 1    SPM 50    Minutes 15    METs 1.5      Prescription Details   Frequency (times per week) 2    Duration Progress to 30 minutes of continuous aerobic without signs/symptoms of physical distress      Intensity   THRR 40-80% of Max Heartrate 56-111    Ratings of Perceived Exertion 11-13    Perceived Dyspnea 0-4       Progression   Progression Continue to progress workloads to maintain intensity without signs/symptoms of physical distress.      Resistance Training   Training Prescription Yes    Weight red bands    Reps 10-15             Perform Capillary Blood Glucose checks as needed.  Exercise Prescription Changes:   Exercise Comments:   Exercise Goals and Review:   Exercise Goals     Row Name 12/28/22 1103             Exercise Goals   Increase Physical Activity Yes       Intervention Provide advice, education, support and counseling about physical activity/exercise needs.;Develop an individualized exercise prescription for aerobic and resistive training based on initial evaluation findings, risk stratification, comorbidities and participant's personal goals.       Expected Outcomes Short Term: Attend rehab on a regular basis to increase amount of physical activity.;Long Term: Exercising regularly at least 3-5 days a week.;Long Term: Add in home exercise to make exercise part of routine and to increase amount of physical activity.       Increase Strength and Stamina Yes       Intervention Provide advice, education, support and counseling about physical activity/exercise needs.;Develop an individualized exercise prescription for aerobic and resistive training based on initial evaluation findings, risk stratification, comorbidities and participant's personal goals.       Expected Outcomes Short Term: Increase workloads from initial exercise prescription for resistance, speed, and METs.;Short Term: Perform resistance training exercises routinely during rehab and add in resistance training at home;Long Term: Improve cardiorespiratory fitness, muscular endurance and strength as measured by increased METs and functional capacity ( )       Able to understand and use rate of perceived exertion (RPE) scale Yes       Intervention Provide education and explanation on how to use RPE scale        Expected Outcomes Short Term: Able to use RPE daily in rehab to express subjective intensity level;Long Term:  Able to use RPE to guide intensity level when exercising independently       Able to  understand and use Dyspnea scale Yes       Intervention Provide education and explanation on how to use Dyspnea scale       Expected Outcomes Short Term: Able to use Dyspnea scale daily in rehab to express subjective sense of shortness of breath during exertion;Long Term: Able to use Dyspnea scale to guide intensity level when exercising independently       Knowledge and understanding of Target Heart Rate Range (THRR) Yes       Intervention Provide education and explanation of THRR including how the numbers were predicted and where they are located for reference       Expected Outcomes Short Term: Able to state/look up THRR;Long Term: Able to use THRR to govern intensity when exercising independently;Short Term: Able to use daily as guideline for intensity in rehab       Understanding of Exercise Prescription Yes       Intervention Provide education, explanation, and written materials on patient's individual exercise prescription       Expected Outcomes Short Term: Able to explain program exercise prescription;Long Term: Able to explain home exercise prescription to exercise independently                Exercise Goals Re-Evaluation :   Discharge Exercise Prescription (Final Exercise Prescription Changes):   Nutrition:  Target Goals: Understanding of nutrition guidelines, daily intake of sodium 1500mg , cholesterol 200mg , calories 30% from fat and 7% or less from saturated fats, daily to have 5 or more servings of fruits and vegetables.  Biometrics:  Pre Biometrics - 12/28/22 1209       Pre Biometrics   Grip Strength 12 kg              Nutrition Therapy Plan and Nutrition Goals:   Nutrition Assessments:  MEDIFICTS Score Key: ?70 Need to make dietary changes  40-70 Heart Healthy  Diet ? 40 Therapeutic Level Cholesterol Diet   Picture Your Plate Scores: <16 Unhealthy dietary pattern with much room for improvement. 41-50 Dietary pattern unlikely to meet recommendations for good health and room for improvement. 51-60 More healthful dietary pattern, with some room for improvement.  >60 Healthy dietary pattern, although there may be some specific behaviors that could be improved.    Nutrition Goals Re-Evaluation:   Nutrition Goals Discharge (Final Nutrition Goals Re-Evaluation):   Psychosocial: Target Goals: Acknowledge presence or absence of significant depression and/or stress, maximize coping skills, provide positive support system. Participant is able to verbalize types and ability to use techniques and skills needed for reducing stress and depression.  Initial Review & Psychosocial Screening:  Initial Psych Review & Screening - 12/28/22 1054       Initial Review   Current issues with None Identified      Family Dynamics   Good Support System? Yes    Comments son      Barriers   Psychosocial barriers to participate in program There are no identifiable barriers or psychosocial needs.             Quality of Life Scores:  Scores of 19 and below usually indicate a poorer quality of life in these areas.  A difference of  2-3 points is a clinically meaningful difference.  A difference of 2-3 points in the total score of the Quality of Life Index has been associated with significant improvement in overall quality of life, self-image, physical symptoms, and general health in studies assessing change in quality of life.  PHQ-9: Review  Flowsheet  More data exists      12/28/2022 11/28/2022 07/20/2022 07/14/2022 12/29/2021  Depression screen PHQ 2/9  Decreased Interest 0 0 0 0 0  Down, Depressed, Hopeless 0 0 0 0 0  PHQ - 2 Score 0 0 0 0 0  Altered sleeping 1 - 0 0 0  Tired, decreased energy 1 - 2 1 1   Change in appetite 1 - 1 0 0  Feeling bad or failure  about yourself  0 - 0 0 0  Trouble concentrating 0 - 0 0 0  Moving slowly or fidgety/restless 0 - 0 0 0  Suicidal thoughts 0 - 0 0 0  PHQ-9 Score 3 - 3 1 1   Difficult doing work/chores Not difficult at all - Not difficult at all Somewhat difficult Not difficult at all   Interpretation of Total Score  Total Score Depression Severity:  1-4 = Minimal depression, 5-9 = Mild depression, 10-14 = Moderate depression, 15-19 = Moderately severe depression, 20-27 = Severe depression   Psychosocial Evaluation and Intervention:  Psychosocial Evaluation - 12/28/22 1055       Psychosocial Evaluation & Interventions   Interventions Encouraged to exercise with the program and follow exercise prescription    Comments [name] denies any psychosocal barriers to exercise.    Expected Outcomes For [name] to participate in PR free of psychosocial barriers.    Continue Psychosocial Services  No Follow up required             Psychosocial Re-Evaluation:   Psychosocial Discharge (Final Psychosocial Re-Evaluation):   Education: Education Goals: Education classes will be provided on a weekly basis, covering required topics. Participant will state understanding/return demonstration of topics presented.  Learning Barriers/Preferences:  Learning Barriers/Preferences - 12/28/22 1055       Learning Barriers/Preferences   Learning Barriers Sight    Learning Preferences Computer/Internet;Written Material;Pictoral             Education Topics: Introduction to Pulmonary Rehab Group instruction provided by PowerPoint, verbal discussion, and written material to support subject matter. Instructor reviews what Pulmonary Rehab is, the purpose of the program, and how patients are referred.     Know Your Numbers Group instruction that is supported by a PowerPoint presentation. Instructor discusses importance of knowing and understanding resting, exercise, and post-exercise oxygen saturation, heart rate,  and blood pressure. Oxygen saturation, heart rate, blood pressure, rating of perceived exertion, and dyspnea are reviewed along with a normal range for these values.    Exercise for the Pulmonary Patient Group instruction that is supported by a PowerPoint presentation. Instructor discusses benefits of exercise, core components of exercise, frequency, duration, and intensity of an exercise routine, importance of utilizing pulse oximetry during exercise, safety while exercising, and options of places to exercise outside of rehab.       MET Level  Group instruction provided by PowerPoint, verbal discussion, and written material to support subject matter. Instructor reviews what METs are and how to increase METs.    Pulmonary Medications Verbally interactive group education provided by instructor with focus on inhaled medications and proper administration.   Anatomy and Physiology of the Respiratory System Group instruction provided by PowerPoint, verbal discussion, and written material to support subject matter. Instructor reviews respiratory cycle and anatomical components of the respiratory system and their functions. Instructor also reviews differences in obstructive and restrictive respiratory diseases with examples of each.    Oxygen Safety Group instruction provided by PowerPoint, verbal discussion, and written material to support subject  matter. There is an overview of "What is Oxygen" and "Why do we need it".  Instructor also reviews how to create a safe environment for oxygen use, the importance of using oxygen as prescribed, and the risks of noncompliance. There is a brief discussion on traveling with oxygen and resources the patient may utilize.   Oxygen Use Group instruction provided by PowerPoint, verbal discussion, and written material to discuss how supplemental oxygen is prescribed and different types of oxygen supply systems. Resources for more information are provided.     Breathing Techniques Group instruction that is supported by demonstration and informational handouts. Instructor discusses the benefits of pursed lip and diaphragmatic breathing and detailed demonstration on how to perform both.     Risk Factor Reduction Group instruction that is supported by a PowerPoint presentation. Instructor discusses the definition of a risk factor, different risk factors for pulmonary disease, and how the heart and lungs work together.   MD Day A group question and answer session with a medical doctor that allows participants to ask questions that relate to their pulmonary disease state.   Nutrition for the Pulmonary Patient Group instruction provided by PowerPoint slides, verbal discussion, and written materials to support subject matter. The instructor gives an explanation and review of healthy diet recommendations, which includes a discussion on weight management, recommendations for fruit and vegetable consumption, as well as protein, fluid, caffeine, fiber, sodium, sugar, and alcohol. Tips for eating when patients are short of breath are discussed.    Other Education Group or individual verbal, written, or video instructions that support the educational goals of the pulmonary rehab program.    Knowledge Questionnaire Score:   Core Components/Risk Factors/Patient Goals at Admission:  Personal Goals and Risk Factors at Admission - 12/28/22 1056       Core Components/Risk Factors/Patient Goals on Admission    Weight Management Weight Gain;Yes    Intervention Weight Management: Develop a combined nutrition and exercise program designed to reach desired caloric intake, while maintaining appropriate intake of nutrient and fiber, sodium and fats, and appropriate energy expenditure required for the weight goal.;Weight Management: Provide education and appropriate resources to help participant work on and attain dietary goals.    Expected Outcomes Short Term:  Continue to assess and modify interventions until short term weight is achieved;Long Term: Adherence to nutrition and physical activity/exercise program aimed toward attainment of established weight goal;Weight Maintenance: Understanding of the daily nutrition guidelines, which includes 25-35% calories from fat, 7% or less cal from saturated fats, less than 200mg  cholesterol, less than 1.5gm of sodium, & 5 or more servings of fruits and vegetables daily;Weight Gain: Understanding of general recommendations for a high calorie, high protein meal plan that promotes weight gain by distributing calorie intake throughout the day with the consumption for 4-5 meals, snacks, and/or supplements;Understanding of distribution of calorie intake throughout the day with the consumption of 4-5 meals/snacks;Understanding recommendations for meals to include 15-35% energy as protein, 25-35% energy from fat, 35-60% energy from carbohydrates, less than 200mg  of dietary cholesterol, 20-35 gm of total fiber daily    Improve shortness of breath with ADL's Yes    Intervention Provide education, individualized exercise plan and daily activity instruction to help decrease symptoms of SOB with activities of daily living.    Expected Outcomes Short Term: Improve cardiorespiratory fitness to achieve a reduction of symptoms when performing ADLs;Long Term: Be able to perform more ADLs without symptoms or delay the onset of symptoms  Core Components/Risk Factors/Patient Goals Review:    Core Components/Risk Factors/Patient Goals at Discharge (Final Review):    ITP Comments:Dr. Mechele Collin is Medical Director for Pulmonary Rehab at [facility].

## 2022-12-28 NOTE — Progress Notes (Signed)
Pulmonary Rehab Orientation Physical Assessment Note  Physical assessment reveals patient is alert and oriented x 4. Heart rate is normal, breath sounds diminished to auscultation, no wheezes, rales, or rhonchi. Pt denies chronic cough. Bowel sounds present x4 quads.  Pt denies abdominal discomfort, nausea, vomiting, diarrhea. States she has intermittent constipation which she takes laxatives for. Grip strength equal, strong. Distal pulses +2; no swelling to lower extremities.   Essie Hart, RN, BSN

## 2023-01-03 ENCOUNTER — Encounter (HOSPITAL_COMMUNITY)
Admission: RE | Admit: 2023-01-03 | Discharge: 2023-01-03 | Disposition: A | Discharge status: Home / Self Care | Payer: Medicare Other | Source: Ambulatory Visit | Attending: Pulmonary Disease | Admitting: Pulmonary Disease

## 2023-01-03 VITALS — Wt 120.4 lb

## 2023-01-03 DIAGNOSIS — J984 Other disorders of lung: Secondary | ICD-10-CM | POA: Diagnosis not present

## 2023-01-03 NOTE — Progress Notes (Unsigned)
No chief complaint on file.   HPI: [name] [name] 82 y.o. come in for Chronic disease management    MDM: Patient seen emergency room for evaluation of a fall.  Physical exam with tenderness over the right elbow, right knee.  Trauma imaging including CT head, C-spine, chest x-ray, elbow x-ray, knee x-ray reassuringly negative for acute traumatic injury.  Tetanus updated here in the emergency room and and with negative trauma workup she is safe for discharge with outpatient follow-up.  She currently does not meet inpatient criteria for admission.   ROS: See pertinent positives and negatives per HPI.  Past Medical History:  Diagnosis Date   COVID 10/25/2020   Dog bite of thigh 04/09/2012   Elevated lipids    GERD (gastroesophageal reflux disease)    Hypertension    Hypothyroidism    post thyroidectomy   Osteoarthritis    Osteopenia    t -2.5 hip in 2005 by Dexa   Osteoporosis    commpr fx after dog related fall  injury   Seasonal rhinitis    spring and fall   Skin cancer    squamous cell ca chest   Vaginal prolapse    hs corrected    Family History  Problem Relation Age of Onset   Heart attack Mother    Alcohol abuse Mother    Kidney cancer Father    Hypertension Maternal Grandmother    Esophageal cancer Neg Hx    Colon cancer Neg Hx    Pancreatic cancer Neg Hx    Stomach cancer Neg Hx     Social History   Socioeconomic History   Marital status: Widowed    Spouse name: Not on file   Number of children: 1   Years of education: Not on file   Highest education level: Associate degree: academic program  Occupational History   Occupation: retired  Tobacco Use   Smoking status: Never   Smokeless tobacco: Never  Vaping Use   Vaping Use: Never used  Substance and Sexual Activity   Alcohol use: Yes    Comment: occasionally   Drug use: No   Sexual activity: Not Currently    Partners: Male    Birth control/protection: Surgical    Comment: hysterectomy, older  than 16, less than 5  Other Topics Concern   Not on file  Social History Narrative   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   Regular Exercise- yes      1 son, never smoker no alcohol 1 caffeinated beverage   Social Determinants of Health   Financial Resource Strain: Low Risk  (05/24/2022)   Overall Financial Resource Strain (CARDIA)    Difficulty of Paying Living Expenses: Not hard at all  Food Insecurity: No Food Insecurity (12/23/2022)   Hunger Vital Sign    Worried About Running Out of Food in the Last Year: Never true    Ran Out of Food in the Last Year: Never true  Transportation Needs: No Transportation Needs (12/23/2022)   PRAPARE - Administrator, Civil Service (Medical): No    Lack of Transportation (Non-Medical): No  Physical Activity: Insufficiently Active (05/24/2022)   Exercise Vital Sign    Days of Exercise per Week: 5 days    Minutes of Exercise per Session: 20 min  Stress: No Stress Concern Present (05/24/2022)   [employer] of Occupational Health - Occupational Stress Questionnaire    Feeling of Stress : Not at  all  Social Connections: Moderately Integrated (05/24/2022)   Social Connection and Isolation Panel [NHANES]    Frequency of Communication with Friends and Family: More than three times a week    Frequency of Social Gatherings with Friends and Family: Once a week    Attends Religious Services: More than 4 times per year    Active Member of [employer] or Organizations: Yes    Attends Banker Meetings: More than 4 times per year    Marital Status: Widowed    Outpatient Medications Prior to Visit  Medication Sig Dispense Refill   albuterol (VENTOLIN HFA) 108 (90 Base) MCG/ACT inhaler SMARTSIG:2 Inhalation Via Inhaler Every 6 Hours PRN     amLODipine (NORVASC) 5 MG tablet Take 1 tablet (5 mg total) by mouth daily. 90 tablet 3   aspirin 325 MG EC tablet Take 325 mg by mouth every 6 (six) hours as  needed for pain.     azelastine (ASTELIN) 0.1 % nasal spray Place 1 spray into both nostrils 2 (two) times daily. (Patient not taking: Reported on 12/28/2022)     azithromycin (ZITHROMAX) 250 MG tablet Sig as indicated 6 tablet 0   b complex vitamins tablet Take 1 tablet by mouth daily.     benzonatate (TESSALON) 100 MG capsule Take 1 capsule (100 mg total) by mouth 2 (two) times daily as needed for cough. (Patient not taking: Reported on 12/28/2022) 20 capsule 0   budesonide-formoterol (SYMBICORT) 80-4.5 MCG/ACT inhaler Inhale 2 puffs into the lungs 2 (two) times daily. 1 each 2   CALCIUM PO Take 1,200 mg by mouth daily.      denosumab (PROLIA) 60 MG/ML SOLN injection Inject 60 mg into the skin every 6 (six) months. Administer in upper arm, thigh, or abdomen     dextromethorphan-guaiFENesin (MUCINEX DM) 30-600 MG 12hr tablet Take 1 tablet by mouth 2 (two) times daily as needed for cough. 12 tablet 3   famotidine (PEPCID) 40 MG tablet Take 1 tablet (40 mg total) by mouth every evening. (Patient not taking: Reported on 12/28/2022) 30 tablet 0   fexofenadine (ALLEGRA) 60 MG tablet Take 1 tablet (60 mg total) by mouth daily for 15 days. 15 tablet 0   fluticasone (FLONASE) 50 MCG/ACT nasal spray Place 2 sprays into both nostrils daily. 18.2 g 0   ipratropium (ATROVENT) 0.06 % nasal spray Place into the nose. (Patient not taking: Reported on 12/28/2022)     levothyroxine (SYNTHROID) 100 MCG tablet TAKE 1 TABLET(100 MCG) BY MOUTH DAILY 90 tablet 2   pantoprazole (PROTONIX) 40 MG tablet Take 1 tablet (40 mg total) by mouth in the morning. (Patient taking differently: Take 20 mg by mouth in the morning.) 30 tablet 0   Spacer/Aero-Holding Chambers (EASIVENT) inhaler See admin instructions.     Facility-Administered Medications Prior to Visit  Medication Dose Route Frequency Provider Last Rate Last Admin   [START ON 01/05/2023] denosumab (PROLIA) injection 60 mg  60 mg Subcutaneous Once Genia Del, MD          EXAM:  There were no vitals taken for this visit.  There is no height or weight on file to calculate BMI.  GENERAL: vitals reviewed and listed above, alert, oriented, appears well hydrated and in no acute distress HEENT: atraumatic, conjunctiva  clear, no obvious abnormalities on inspection of external nose and ears OP : no lesion edema or exudate  NECK: no obvious masses on inspection palpation  LUNGS: clear to auscultation bilaterally, no wheezes, rales  or rhonchi, good air movement CV: HRRR, no clubbing cyanosis or  peripheral edema nl cap refill  MS: moves all extremities without noticeable focal  abnormality PSYCH: pleasant and cooperative, no obvious depression or anxiety Lab Results  Component Value Date   WBC 5.1 07/20/2022   HGB 13.3 07/20/2022   HCT 38.9 07/20/2022   PLT 294.0 07/20/2022   GLUCOSE 91 07/20/2022   CHOL 230 (H) 07/20/2022   TRIG 121.0 07/20/2022   HDL 68.10 07/20/2022   LDLDIRECT 134.1 12/27/2006   LDLCALC 138 (H) 07/20/2022   ALT 21 07/20/2022   AST 28 07/20/2022   NA 141 07/20/2022   K 3.6 07/20/2022   CL 105 07/20/2022   CREATININE 0.54 07/20/2022   BUN 15 07/20/2022   CO2 29 07/20/2022   TSH 3.19 07/20/2022   INR 1.0 07/25/2007   HGBA1C 5.8 09/23/2020   BP Readings from Last 3 Encounters:  12/28/22 122/76  12/20/22 123/80  12/05/22 126/80    ASSESSMENT AND PLAN:  Discussed the following assessment and plan:  No diagnosis found.  -Patient advised to return or notify health care team  if  new concerns arise.  There are no Patient Instructions on file for this visit.   Neta Mends. Mikka Kissner M.D.

## 2023-01-03 NOTE — Progress Notes (Signed)
Daily Session Note  Patient Details  Name: [name] [name] MRN: [id number] Date of Birth: [birth year] Referring Provider:   Doristine Devoid Pulmonary Rehab Walk Test from 12/28/2022 in [facility]  Referring Provider [name]       Encounter Date: 01/03/2023  Check In:  Session Check In - 01/03/23 1057       Check-In   Supervising physician immediately available to respond to emergencies [facility] MD immediately available    Physician(s) Carlos Levering, NP    Location [facility]    Staff Present Raford Pitcher, MS, ACSM-CEP, Exercise Physiologist;Samantha Belarus, RD, Dutch Gray, RN, BSN;Randi Reeve BS, ACSM-CEP, Exercise Physiologist;Casey Katrinka Blazing, RT    Virtual Visit No    Medication changes reported     No    Fall or balance concerns reported    Yes    Tobacco Cessation No Change    Warm-up and Cool-down Performed as group-led instruction    Resistance Training Performed Yes    VAD Patient? No    PAD/SET Patient? No      Pain Assessment   Currently in Pain? No/denies    Multiple Pain Sites No             Capillary Blood Glucose: No results found for this or any previous visit (from the past 24 hour(s)).   Exercise Prescription Changes - 01/03/23 1200       Response to Exercise   Blood Pressure (Admit) 124/70    Blood Pressure (Exercise) 142/68    Blood Pressure (Exit) 118/60    Heart Rate (Admit) 89 bpm    Heart Rate (Exercise) 101 bpm    Heart Rate (Exit) 93 bpm    Oxygen Saturation (Admit) 97 %    Oxygen Saturation (Exercise) 94 %    Oxygen Saturation (Exit) 96 %    Rating of Perceived Exertion (Exercise) 89    Perceived Dyspnea (Exercise) 101    Symptoms 93    Duration Progress to 30 minutes of  aerobic without signs/symptoms of physical distress    Intensity THRR unchanged      Progression   Progression Continue to progress workloads to maintain intensity without  signs/symptoms of physical distress.      Resistance Training   Training Prescription Yes    Weight red bands    Reps 10-15    Time 10 Minutes      Recumbant Bike   Level 1    Minutes 15    METs 1.7      NuStep   Level 1    Minutes 15    METs 1.6             Social History   Tobacco Use  Smoking Status Never  Smokeless Tobacco Never    Goals Met:  Proper associated with RPD/PD & O2 Sat Exercise tolerated well No report of concerns or symptoms today Strength training completed today  Goals Unmet:  Not Applicable  Comments: Service time is from 1017 to 1139.    Dr. Mechele Collin is Medical Director for Pulmonary Rehab at [facility].

## 2023-01-04 ENCOUNTER — Ambulatory Visit (INDEPENDENT_AMBULATORY_CARE_PROVIDER_SITE_OTHER): Payer: Medicare Other | Admitting: Internal Medicine

## 2023-01-04 ENCOUNTER — Encounter: Payer: Self-pay | Admitting: Internal Medicine

## 2023-01-04 VITALS — BP 136/70 | HR 100 | Temp 97.9°F | Ht 60.0 in | Wt 119.4 lb

## 2023-01-04 DIAGNOSIS — J984 Other disorders of lung: Secondary | ICD-10-CM | POA: Diagnosis not present

## 2023-01-04 DIAGNOSIS — M419 Scoliosis, unspecified: Secondary | ICD-10-CM

## 2023-01-04 DIAGNOSIS — Z9181 History of falling: Secondary | ICD-10-CM | POA: Diagnosis not present

## 2023-01-04 DIAGNOSIS — W19XXXS Unspecified fall, sequela: Secondary | ICD-10-CM | POA: Diagnosis not present

## 2023-01-04 DIAGNOSIS — R634 Abnormal weight loss: Secondary | ICD-10-CM | POA: Diagnosis not present

## 2023-01-04 NOTE — Progress Notes (Signed)
Pulmonary Individual Treatment Plan  Patient Details  Name: [name] [name] MRN: [id number] Date of Birth: 1941-01-28 Referring Provider:   Doristine Devoid Pulmonary Rehab Walk Test from 12/28/2022 in [facility] for [facility], V[facility], & Lung [facility]  Referring Provider McNeil  [Ellison]       Initial Encounter Date:  Flowsheet Row Pulmonary Rehab Walk Test from 12/28/2022 in [facility] for Heart, Vascular, & Lung Health  Date 12/28/22       Visit Diagnosis: Other disorders of lung  Patient's Home Medications on Admission:   Current Outpatient Medications:    albuterol (VENTOLIN HFA) 108 (90 Base) MCG/ACT inhaler, SMARTSIG:2 Inhalation Via Inhaler Every 6 Hours PRN, Disp: , Rfl:    amLODipine (NORVASC) 5 MG tablet, Take 1 tablet (5 mg total) by mouth daily., Disp: 90 tablet, Rfl: 3   aspirin 325 MG EC tablet, Take 325 mg by mouth every 6 (six) hours as needed for pain., Disp: , Rfl:    azelastine (ASTELIN) 0.1 % nasal spray, Place 1 spray into both nostrils 2 (two) times daily. (Patient not taking: Reported on 12/28/2022), Disp: , Rfl:    azithromycin (ZITHROMAX) 250 MG tablet, Sig as indicated, Disp: 6 tablet, Rfl: 0   b complex vitamins tablet, Take 1 tablet by mouth daily., Disp: , Rfl:    benzonatate (TESSALON) 100 MG capsule, Take 1 capsule (100 mg total) by mouth 2 (two) times daily as needed for cough. (Patient not taking: Reported on 12/28/2022), Disp: 20 capsule, Rfl: 0   budesonide-formoterol (SYMBICORT) 80-4.5 MCG/ACT inhaler, Inhale 2 puffs into the lungs 2 (two) times daily., Disp: 1 each, Rfl: 2   CALCIUM PO, Take 1,200 mg by mouth daily. , Disp: , Rfl:    denosumab (PROLIA) 60 MG/ML SOLN injection, Inject 60 mg into the skin every 6 (six) months. Administer in upper arm, thigh, or abdomen, Disp: , Rfl:    dextromethorphan-guaiFENesin (MUCINEX DM) 30-600 MG 12hr tablet, Take 1 tablet by mouth 2 (two) times daily as needed for cough.,  Disp: 12 tablet, Rfl: 3   famotidine (PEPCID) 40 MG tablet, Take 1 tablet (40 mg total) by mouth every evening. (Patient not taking: Reported on 12/28/2022), Disp: 30 tablet, Rfl: 0   fexofenadine (ALLEGRA) 60 MG tablet, Take 1 tablet (60 mg total) by mouth daily for 15 days., Disp: 15 tablet, Rfl: 0   fluticasone (FLONASE) 50 MCG/ACT nasal spray, Place 2 sprays into both nostrils daily., Disp: 18.2 g, Rfl: 0   ipratropium (ATROVENT) 0.06 % nasal spray, Place into the nose. (Patient not taking: Reported on 12/28/2022), Disp: , Rfl:    levothyroxine (SYNTHROID) 100 MCG tablet, TAKE 1 TABLET(100 MCG) BY MOUTH DAILY, Disp: 90 tablet, Rfl: 2   pantoprazole (PROTONIX) 40 MG tablet, Take 1 tablet (40 mg total) by mouth in the morning. (Patient taking differently: Take 20 mg by mouth in the morning.), Disp: 30 tablet, Rfl: 0   Spacer/Aero-Holding Chambers (EASIVENT) inhaler, See admin instructions., Disp: , Rfl:   Current Facility-Administered Medications:    [START ON 01/05/2023] denosumab (PROLIA) injection 60 mg, 60 mg, Subcutaneous, Once, Genia Del, MD  Past Medical History: Past Medical History:  Diagnosis Date   COVID 10/25/2020   Dog bite of thigh 04/09/2012   Elevated lipids    GERD (gastroesophageal reflux disease)    Hypertension    Hypothyroidism    post thyroidectomy   Osteoarthritis    Osteopenia    t -2.5 hip in 2005  by Dexa   Osteoporosis    commpr fx after dog related fall  injury   Seasonal rhinitis    spring and fall   Skin cancer    squamous cell ca chest   Vaginal prolapse    hs corrected    Tobacco Use: Social History   Tobacco Use  Smoking Status Never  Smokeless Tobacco Never    Labs: Review Flowsheet  More data exists      Latest Ref Rng & Units 05/11/2018 05/14/2019 09/23/2020 08/30/2021 07/20/2022  Labs for ITP Cardiac and Pulmonary Rehab  Cholestrol 0 - 200 mg/dL 829  562  130  865  784   LDL (calc) 0 - 99 mg/dL 696  295  284  132  440   HDL-C  >39.00 mg/dL 10.27  25.36  64.40  34.74  68.10   Trlycerides 0.0 - 149.0 mg/dL 25.9  56.3  87.5  643.3  121.0   Hemoglobin A1c 4.6 - 6.5 % - - 5.8  - -    Capillary Blood Glucose: No results found for: "GLUCAP"   Pulmonary Assessment Scores:  Pulmonary Assessment Scores     Row Name 12/28/22 1058         ADL UCSD   ADL Phase Entry     SOB Score total 11       CAT Score   CAT Score 7       mMRC Score   mMRC Score 3             UCSD: Self-administered rating of dyspnea associated with activities of daily living (ADLs) 6-point scale (0 = "not at all" to 5 = "maximal or unable to do because of breathlessness")  Scoring Scores range from 0 to 120.  Minimally important difference is 5 units  CAT: CAT can identify the health impairment of COPD patients and is better correlated with disease progression.  CAT has a scoring range of zero to 40. The CAT score is classified into four groups of low (less than 10), medium (10 - 20), high (21-30) and very high (31-40) based on the impact level of disease on health status. A CAT score over 10 suggests significant symptoms.  A worsening CAT score could be explained by an exacerbation, poor medication adherence, poor inhaler technique, or progression of COPD or comorbid conditions.  CAT MCID is 2 points  mMRC: mMRC (Modified Medical Research Council) Dyspnea Scale is used to assess the degree of baseline functional disability in patients of respiratory disease due to dyspnea. No minimal important difference is established. A decrease in score of 1 point or greater is considered a positive change.   Pulmonary Function Assessment:  Pulmonary Function Assessment - 12/28/22 1132       Breath   Bilateral Breath Sounds --             Exercise Target Goals: Exercise Program Goal: Individual exercise prescription set using results from initial 6 min walk test and THRR while considering  patient's activity barriers and safety.    Exercise Prescription Goal: Initial exercise prescription builds to 30-45 minutes a day of aerobic activity, 2-3 days per week.  Home exercise guidelines will be given to patient during program as part of exercise prescription that the participant will acknowledge.  Activity Barriers & Risk Stratification:  Activity Barriers & Cardiac Risk Stratification - 12/28/22 1102       Activity Barriers & Cardiac Risk Stratification   Activity Barriers Deconditioning;Muscular Weakness;Shortness of Breath;History of  Falls;Arthritis;Neck/Spine Problems             6 Minute Walk:  6 Minute Walk     Row Name 12/28/22 1203         6 Minute Walk   Phase Initial     Distance 610 feet     Walk Time 6 minutes     # of Rest Breaks 4  4 rest breaks at: 1:46-2:07, 2:49-3:05, 3:49-4:32, 5:18-5:42     MPH 1.16     METS 1.63     RPE 13     Perceived Dyspnea  3     VO2 Peak 5.69     Symptoms Yes (comment)     Comments dyspnea     Resting HR 89 bpm     Resting BP 122/76     Resting Oxygen Saturation  98 %     Exercise Oxygen Saturation  during 6 min walk 86 %     Max Ex. HR 141 bpm     Max Ex. BP 140/82     2 Minute Post BP 122/78       Interval HR   1 Minute HR 114     2 Minute HR 116     3 Minute HR 129     4 Minute HR 141     5 Minute HR 135     6 Minute HR 130     2 Minute Post HR 103     Interval Heart Rate? Yes       Interval Oxygen   Interval Oxygen? Yes     Baseline Oxygen Saturation % 98 %     1 Minute Oxygen Saturation % 86 %  at 1:46 O2 dropped to 86%, PLB right up to 91%     1 Minute Liters of Oxygen 0 L     2 Minute Oxygen Saturation % 95 %     2 Minute Liters of Oxygen 0 L     3 Minute Oxygen Saturation % 86 %  2:49 O2 dropped to 86%, PLB up to 91%     3 Minute Liters of Oxygen 0 L     4 Minute Oxygen Saturation % 94 %     4 Minute Liters of Oxygen 0 L     5 Minute Oxygen Saturation % 92 %     5 Minute Liters of Oxygen 0 L     6 Minute Oxygen Saturation % 90 %      6 Minute Liters of Oxygen 0 L     2 Minute Post Oxygen Saturation % 94 %     2 Minute Post Liters of Oxygen 0 L              Oxygen Initial Assessment:  Oxygen Initial Assessment - 12/28/22 1058       Home Oxygen   Home Oxygen Device None    Sleep Oxygen Prescription None    Home Exercise Oxygen Prescription None    Home Resting Oxygen Prescription None      Initial 6 min Walk   Oxygen Used None      Program Oxygen Prescription   Program Oxygen Prescription None      Intervention   Short Term Goals To learn and understand importance of maintaining oxygen saturations>88%;To learn and demonstrate proper use of respiratory medications;To learn and understand importance of monitoring SPO2 with pulse oximeter and demonstrate accurate use of the pulse oximeter.;To learn and demonstrate proper pursed  lip breathing techniques or other breathing techniques.     Long  Term Goals Verbalizes importance of monitoring SPO2 with pulse oximeter and return demonstration;Maintenance of O2 saturations>88%;Exhibits proper breathing techniques, such as pursed lip breathing or other method taught during program session;Compliance with respiratory medication             Oxygen Re-Evaluation:  Oxygen Re-Evaluation     Row Name 12/30/22 1028             Program Oxygen Prescription   Program Oxygen Prescription None         Home Oxygen   Home Oxygen Device None       Sleep Oxygen Prescription None       Home Exercise Oxygen Prescription None       Home Resting Oxygen Prescription None         Goals/Expected Outcomes   Short Term Goals To learn and understand importance of maintaining oxygen saturations>88%;To learn and demonstrate proper use of respiratory medications;To learn and understand importance of monitoring SPO2 with pulse oximeter and demonstrate accurate use of the pulse oximeter.;To learn and demonstrate proper pursed lip breathing techniques or other breathing  techniques.        Long  Term Goals Verbalizes importance of monitoring SPO2 with pulse oximeter and return demonstration;Maintenance of O2 saturations>88%;Exhibits proper breathing techniques, such as pursed lip breathing or other method taught during program session;Compliance with respiratory medication       Goals/Expected Outcomes Compliance and understanding of oxygen saturation monitoring and breathing techniques to decrease shortness of breath.                Oxygen Discharge (Final Oxygen Re-Evaluation):  Oxygen Re-Evaluation - 12/30/22 1028       Program Oxygen Prescription   Program Oxygen Prescription None      Home Oxygen   Home Oxygen Device None    Sleep Oxygen Prescription None    Home Exercise Oxygen Prescription None    Home Resting Oxygen Prescription None      Goals/Expected Outcomes   Short Term Goals To learn and understand importance of maintaining oxygen saturations>88%;To learn and demonstrate proper use of respiratory medications;To learn and understand importance of monitoring SPO2 with pulse oximeter and demonstrate accurate use of the pulse oximeter.;To learn and demonstrate proper pursed lip breathing techniques or other breathing techniques.     Long  Term Goals Verbalizes importance of monitoring SPO2 with pulse oximeter and return demonstration;Maintenance of O2 saturations>88%;Exhibits proper breathing techniques, such as pursed lip breathing or other method taught during program session;Compliance with respiratory medication    Goals/Expected Outcomes Compliance and understanding of oxygen saturation monitoring and breathing techniques to decrease shortness of breath.             Initial Exercise Prescription:  Initial Exercise Prescription - 12/28/22 1200       Date of Initial Exercise RX and Referring Provider   Date 12/28/22    Referring Provider [name]   Expected Discharge Date 03/23/23      Recumbant Bike   Level 1    RPM  30    Minutes 15    METs 1.5      NuStep   Level 1    SPM 50    Minutes 15    METs 1.5      Prescription Details   Frequency (times per week) 2    Duration Progress to 30 minutes of continuous aerobic without signs/symptoms  of physical distress      Intensity   THRR 40-80% of Max Heartrate 56-111    Ratings of Perceived Exertion 11-13    Perceived Dyspnea 0-4      Progression   Progression Continue to progress workloads to maintain intensity without signs/symptoms of physical distress.      Resistance Training   Training Prescription Yes    Weight red bands    Reps 10-15             Perform Capillary Blood Glucose checks as needed.  Exercise Prescription Changes:   Exercise Prescription Changes     Row Name 01/03/23 1200             Response to Exercise   Blood Pressure (Admit) 124/70       Blood Pressure (Exercise) 142/68       Blood Pressure (Exit) 118/60       Heart Rate (Admit) 89 bpm       Heart Rate (Exercise) 101 bpm       Heart Rate (Exit) 93 bpm       Oxygen Saturation (Admit) 97 %       Oxygen Saturation (Exercise) 94 %       Oxygen Saturation (Exit) 96 %       Rating of Perceived Exertion (Exercise) 89       Perceived Dyspnea (Exercise) 101       Symptoms 93       Duration Progress to 30 minutes of  aerobic without signs/symptoms of physical distress       Intensity THRR unchanged         Progression   Progression Continue to progress workloads to maintain intensity without signs/symptoms of physical distress.         Resistance Training   Training Prescription Yes       Weight red bands       Reps 10-15       Time 10 Minutes         Recumbant Bike   Level 1       Minutes 15       METs 1.7         NuStep   Level 1       Minutes 15       METs 1.6                Exercise Comments:   Exercise Goals and Review:   Exercise Goals     Row Name 12/28/22 1103             Exercise Goals   Increase Physical Activity  Yes       Intervention Provide advice, education, support and counseling about physical activity/exercise needs.;Develop an individualized exercise prescription for aerobic and resistive training based on initial evaluation findings, risk stratification, comorbidities and participant's personal goals.       Expected Outcomes Short Term: Attend rehab on a regular basis to increase amount of physical activity.;Long Term: Exercising regularly at least 3-5 days a week.;Long Term: Add in home exercise to make exercise part of routine and to increase amount of physical activity.       Increase Strength and Stamina Yes       Intervention Provide advice, education, support and counseling about physical activity/exercise needs.;Develop an individualized exercise prescription for aerobic and resistive training based on initial evaluation findings, risk stratification, comorbidities and participant's personal goals.       Expected Outcomes  Short Term: Increase workloads from initial exercise prescription for resistance, speed, and METs.;Short Term: Perform resistance training exercises routinely during rehab and add in resistance training at home;Long Term: Improve cardiorespiratory fitness, muscular endurance and strength as measured by increased METs and functional capacity ( )       Able to understand and use rate of perceived exertion (RPE) scale Yes       Intervention Provide education and explanation on how to use RPE scale       Expected Outcomes Short Term: Able to use RPE daily in rehab to express subjective intensity level;Long Term:  Able to use RPE to guide intensity level when exercising independently       Able to understand and use Dyspnea scale Yes       Intervention Provide education and explanation on how to use Dyspnea scale       Expected Outcomes Short Term: Able to use Dyspnea scale daily in rehab to express subjective sense of shortness of breath during exertion;Long Term: Able to use  Dyspnea scale to guide intensity level when exercising independently       Knowledge and understanding of Target Heart Rate Range (THRR) Yes       Intervention Provide education and explanation of THRR including how the numbers were predicted and where they are located for reference       Expected Outcomes Short Term: Able to state/look up THRR;Long Term: Able to use THRR to govern intensity when exercising independently;Short Term: Able to use daily as guideline for intensity in rehab       Understanding of Exercise Prescription Yes       Intervention Provide education, explanation, and written materials on patient's individual exercise prescription       Expected Outcomes Short Term: Able to explain program exercise prescription;Long Term: Able to explain home exercise prescription to exercise independently                Exercise Goals Re-Evaluation :  Exercise Goals Re-Evaluation     Row Name 12/30/22 1025             Exercise Goal Re-Evaluation   Exercise Goals Review Increase Physical Activity;Able to understand and use Dyspnea scale;Understanding of Exercise Prescription;Increase Strength and Stamina;Knowledge and understanding of Target Heart Rate Range (THRR);Able to understand and use rate of perceived exertion (RPE) scale       Comments [name] is scheduled to begin exercsise not week. Will continue to monitor and progress as able.       Expected Outcomes Through exercise at rehab and home, the patient will decrease shortness of breath with daily activities and feel confident in carrying out an exercise regimen at home.                Discharge Exercise Prescription (Final Exercise Prescription Changes):  Exercise Prescription Changes - 01/03/23 1200       Response to Exercise   Blood Pressure (Admit) 124/70    Blood Pressure (Exercise) 142/68    Blood Pressure (Exit) 118/60    Heart Rate (Admit) 89 bpm    Heart Rate (Exercise) 101 bpm    Heart Rate (Exit) 93 bpm     Oxygen Saturation (Admit) 97 %    Oxygen Saturation (Exercise) 94 %    Oxygen Saturation (Exit) 96 %    Rating of Perceived Exertion (Exercise) 89    Perceived Dyspnea (Exercise) 101    Symptoms 93    Duration Progress to 30 minutes  of  aerobic without signs/symptoms of physical distress    Intensity THRR unchanged      Progression   Progression Continue to progress workloads to maintain intensity without signs/symptoms of physical distress.      Resistance Training   Training Prescription Yes    Weight red bands    Reps 10-15    Time 10 Minutes      Recumbant Bike   Level 1    Minutes 15    METs 1.7      NuStep   Level 1    Minutes 15    METs 1.6             Nutrition:  Target Goals: Understanding of nutrition guidelines, daily intake of sodium 1500mg , cholesterol 200mg , calories 30% from fat and 7% or less from saturated fats, daily to have 5 or more servings of fruits and vegetables.  Biometrics:  Pre Biometrics - 12/28/22 1209       Pre Biometrics   Grip Strength 12 kg              Nutrition Therapy Plan and Nutrition Goals:  Nutrition Therapy & Goals - 01/03/23 1101       Nutrition Therapy   Diet General Healthy Diet    Drug/Food Interactions Statins/Certain Fruits      Personal Nutrition Goals   Nutrition Goal Patient to identify strategies for weight maintenance/weight gain as needed throughout Pulmonary Rehab.    Comments [name] reports history of unwanted weight loss over the last two year; per documentation, she was 150# July of 2023. Unwanted weight loss has been worked up through GI with normal findings. At this point, she does not want to gain weight but would like to maintain her current weight. She does not cook. She reports prioriziting protein foods such as eggs, protein bars, Ensure and then typically eats protein, vegetable based meal at dinner meal. [name] will benefit from from participation in pulmonary rehab for nutrition,  exercise, and lifestyle modification.      Intervention Plan   Intervention Prescribe, educate and counsel regarding individualized specific dietary modifications aiming towards targeted core components such as weight, hypertension, lipid management, diabetes, heart failure and other comorbidities.;Nutrition handout(s) given to patient.    Expected Outcomes Short Term Goal: Understand basic principles of dietary content, such as calories, fat, sodium, cholesterol and nutrients.;Long Term Goal: Adherence to prescribed nutrition plan.             Nutrition Assessments:  Nutrition Assessments - 01/03/23 1111       Rate Your Plate Scores   Pre Score 63            MEDIFICTS Score Key: ?70 Need to make dietary changes  40-70 Heart Healthy Diet ? 40 Therapeutic Level Cholesterol Diet  Flowsheet Row PULMONARY REHAB OTHER RESPIRATORY from 01/03/2023 in [facility]  Picture Your Plate Total Score on Admission 63      Picture Your Plate Scores: <16 Unhealthy dietary pattern with much room for improvement. 41-50 Dietary pattern unlikely to meet recommendations for good health and room for improvement. 51-60 More healthful dietary pattern, with some room for improvement.  >60 Healthy dietary pattern, although there may be some specific behaviors that could be improved.    Nutrition Goals Re-Evaluation:  Nutrition Goals Re-Evaluation     Row Name 01/03/23 1101             Goals  Current Weight 120 lb 5.9 oz (54.6 kg)       Comment cholesterol 230, LDL 138 (cholesterol is treated with "low fat diet" per provider notes)       Expected Outcome [name] reports history of unwanted weight loss over the last two year; per documentation, she was 150# July of 2023. Unwanted weight loss has been worked up through GI with normal findings.  At this point, she does not want to gain weight but would like to maintain her current weight.  She does not cook. She reports prioriziting protein foods such as eggs, protein bars, Ensure and then typically eats protein, vegetable based meal at dinner meal. [name] will benefit from from participation in pulmonary rehab for nutrition, exercise, and lifestyle modification.                Nutrition Goals Discharge (Final Nutrition Goals Re-Evaluation):  Nutrition Goals Re-Evaluation - 01/03/23 1101       Goals   Current Weight 120 lb 5.9 oz (54.6 kg)    Comment cholesterol 230, LDL 138 (cholesterol is treated with "low fat diet" per provider notes)    Expected Outcome [name] reports history of unwanted weight loss over the last two year; per documentation, she was 150# July of 2023. Unwanted weight loss has been worked up through GI with normal findings.  At this point, she does not want to gain weight but would like to maintain her current weight. She does not cook. She reports prioriziting protein foods such as eggs, protein bars, Ensure and then typically eats protein, vegetable based meal at dinner meal. [name] will benefit from from participation in pulmonary rehab for nutrition, exercise, and lifestyle modification.             Psychosocial: Target Goals: Acknowledge presence or absence of significant depression and/or stress, maximize coping skills, provide positive support system. Participant is able to verbalize types and ability to use techniques and skills needed for reducing stress and depression.  Initial Review & Psychosocial Screening:  Initial Psych Review & Screening - 12/28/22 1054       Initial Review   Current issues with None Identified      Family Dynamics   Good Support System? Yes    Comments son      Barriers   Psychosocial barriers to participate in program There are no identifiable barriers or psychosocial needs.             Quality of Life Scores:  Scores of 19 and below usually indicate a poorer quality of life in these areas.  A  difference of  2-3 points is a clinically meaningful difference.  A difference of 2-3 points in the total score of the Quality of Life Index has been associated with significant improvement in overall quality of life, self-image, physical symptoms, and general health in studies assessing change in quality of life.  PHQ-9: Review Flowsheet  More data exists      12/28/2022 11/28/2022 07/20/2022 07/14/2022 12/29/2021  Depression screen PHQ 2/9  Decreased Interest 0 0 0 0 0  Down, Depressed, Hopeless 0 0 0 0 0  PHQ - 2 Score 0 0 0 0 0  Altered sleeping 1 - 0 0 0  Tired, decreased energy 1 - 2 1 1   Change in appetite 1 - 1 0 0  Feeling bad or failure about yourself  0 - 0 0 0  Trouble concentrating 0 - 0 0 0  Moving slowly or fidgety/restless 0 -  0 0 0  Suicidal thoughts 0 - 0 0 0  PHQ-9 Score 3 - 3 1 1   Difficult doing work/chores Not difficult at all - Not difficult at all Somewhat difficult Not difficult at all   Interpretation of Total Score  Total Score Depression Severity:  1-4 = Minimal depression, 5-9 = Mild depression, 10-14 = Moderate depression, 15-19 = Moderately severe depression, 20-27 = Severe depression   Psychosocial Evaluation and Intervention:  Psychosocial Evaluation - 12/28/22 1055       Psychosocial Evaluation & Interventions   Interventions Encouraged to exercise with the program and follow exercise prescription    Comments [name] denies any psychosocal barriers to exercise.    Expected Outcomes For [name] to participate in PR free of psychosocial barriers.    Continue Psychosocial Services  No Follow up required             Psychosocial Re-Evaluation:  Psychosocial Re-Evaluation     Row Name 01/02/23 [phone]             Psychosocial Re-Evaluation   Current issues with None Identified       Comments No new psychosocial barriers or concerns since orientation on 7/3       Expected Outcomes For [name] to participate in PR free of any psychosocial barriers or  concerns       Interventions Encouraged to attend Pulmonary Rehabilitation for the exercise       Continue Psychosocial Services  No Follow up required                Psychosocial Discharge (Final Psychosocial Re-Evaluation):  Psychosocial Re-Evaluation - 01/02/23 0934       Psychosocial Re-Evaluation   Current issues with None Identified    Comments No new psychosocial barriers or concerns since orientation on 7/3    Expected Outcomes For [name] to participate in PR free of any psychosocial barriers or concerns    Interventions Encouraged to attend Pulmonary Rehabilitation for the exercise    Continue Psychosocial Services  No Follow up required             Education: Education Goals: Education classes will be provided on a weekly basis, covering required topics. Participant will state understanding/return demonstration of topics presented.  Learning Barriers/Preferences:  Learning Barriers/Preferences - 12/28/22 1055       Learning Barriers/Preferences   Learning Barriers Sight    Learning Preferences Computer/Internet;Written Material;Pictoral             Education Topics: Introduction to Pulmonary Rehab Group instruction provided by PowerPoint, verbal discussion, and written material to support subject matter. Instructor reviews what Pulmonary Rehab is, the purpose of the program, and how patients are referred.     Know Your Numbers Group instruction that is supported by a PowerPoint presentation. Instructor discusses importance of knowing and understanding resting, exercise, and post-exercise oxygen saturation, heart rate, and blood pressure. Oxygen saturation, heart rate, blood pressure, rating of perceived exertion, and dyspnea are reviewed along with a normal range for these values.    Exercise for the Pulmonary Patient Group instruction that is supported by a PowerPoint presentation. Instructor discusses benefits of exercise, core components of exercise,  frequency, duration, and intensity of an exercise routine, importance of utilizing pulse oximetry during exercise, safety while exercising, and options of places to exercise outside of rehab.       MET Level  Group instruction provided by PowerPoint, verbal discussion, and written material to support subject matter. Instructor reviews  what METs are and how to increase METs.    Pulmonary Medications Verbally interactive group education provided by instructor with focus on inhaled medications and proper administration.   Anatomy and Physiology of the Respiratory System Group instruction provided by PowerPoint, verbal discussion, and written material to support subject matter. Instructor reviews respiratory cycle and anatomical components of the respiratory system and their functions. Instructor also reviews differences in obstructive and restrictive respiratory diseases with examples of each.    Oxygen Safety Group instruction provided by PowerPoint, verbal discussion, and written material to support subject matter. There is an overview of "What is Oxygen" and "Why do we need it".  Instructor also reviews how to create a safe environment for oxygen use, the importance of using oxygen as prescribed, and the risks of noncompliance. There is a brief discussion on traveling with oxygen and resources the patient may utilize.   Oxygen Use Group instruction provided by PowerPoint, verbal discussion, and written material to discuss how supplemental oxygen is prescribed and different types of oxygen supply systems. Resources for more information are provided.    Breathing Techniques Group instruction that is supported by demonstration and informational handouts. Instructor discusses the benefits of pursed lip and diaphragmatic breathing and detailed demonstration on how to perform both.     Risk Factor Reduction Group instruction that is supported by a PowerPoint presentation. Instructor  discusses the definition of a risk factor, different risk factors for pulmonary disease, and how the heart and lungs work together.   MD Day A group question and answer session with a medical doctor that allows participants to ask questions that relate to their pulmonary disease state.   Nutrition for the Pulmonary Patient Group instruction provided by PowerPoint slides, verbal discussion, and written materials to support subject matter. The instructor gives an explanation and review of healthy diet recommendations, which includes a discussion on weight management, recommendations for fruit and vegetable consumption, as well as protein, fluid, caffeine, fiber, sodium, sugar, and alcohol. Tips for eating when patients are short of breath are discussed.    Other Education Group or individual verbal, written, or video instructions that support the educational goals of the pulmonary rehab program.    Knowledge Questionnaire Score:   Core Components/Risk Factors/Patient Goals at Admission:  Personal Goals and Risk Factors at Admission - 12/28/22 1056       Core Components/Risk Factors/Patient Goals on Admission    Weight Management Weight Gain;Yes    Intervention Weight Management: Develop a combined nutrition and exercise program designed to reach desired caloric intake, while maintaining appropriate intake of nutrient and fiber, sodium and fats, and appropriate energy expenditure required for the weight goal.;Weight Management: Provide education and appropriate resources to help participant work on and attain dietary goals.    Expected Outcomes Short Term: Continue to assess and modify interventions until short term weight is achieved;Long Term: Adherence to nutrition and physical activity/exercise program aimed toward attainment of established weight goal;Weight Maintenance: Understanding of the daily nutrition guidelines, which includes 25-35% calories from fat, 7% or less cal from saturated  fats, less than 200mg  cholesterol, less than 1.5gm of sodium, & 5 or more servings of fruits and vegetables daily;Weight Gain: Understanding of general recommendations for a high calorie, high protein meal plan that promotes weight gain by distributing calorie intake throughout the day with the consumption for 4-5 meals, snacks, and/or supplements;Understanding of distribution of calorie intake throughout the day with the consumption of 4-5 meals/snacks;Understanding recommendations for meals to include  15-35% energy as protein, 25-35% energy from fat, 35-60% energy from carbohydrates, less than 200mg  of dietary cholesterol, 20-35 gm of total fiber daily    Improve shortness of breath with ADL's Yes    Intervention Provide education, individualized exercise plan and daily activity instruction to help decrease symptoms of SOB with activities of daily living.    Expected Outcomes Short Term: Improve cardiorespiratory fitness to achieve a reduction of symptoms when performing ADLs;Long Term: Be able to perform more ADLs without symptoms or delay the onset of symptoms             Core Components/Risk Factors/Patient Goals Review:   Goals and Risk Factor Review     Row Name 01/02/23 0936             Core Components/Risk Factors/Patient Goals Review   Personal Goals Review Weight Management/Obesity;Improve shortness of breath with ADL's;Develop more efficient breathing techniques such as purse lipped breathing and diaphragmatic breathing and practicing self-pacing with activity.       Review [name] is scheduled to start the program on 7/9. We will monitor her progress throughout the program.       Expected Outcomes See admission goals                Core Components/Risk Factors/Patient Goals at Discharge (Final Review):   Goals and Risk Factor Review - 01/02/23 0936       Core Components/Risk Factors/Patient Goals Review   Personal Goals Review Weight Management/Obesity;Improve shortness  of breath with ADL's;Develop more efficient breathing techniques such as purse lipped breathing and diaphragmatic breathing and practicing self-pacing with activity.    Review [name] is scheduled to start the program on 7/9. We will monitor her progress throughout the program.    Expected Outcomes See admission goals             ITP Comments: Pt is making expected progress toward Pulmonary Rehab goals after completing 1 sessions. Recommend continued exercise, life style modification, education, and utilization of breathing techniques to increase stamina and strength, while also decreasing shortness of breath with exertion.  Dr. Mechele Collin is Medical Director for Pulmonary Rehab at [facility].

## 2023-01-04 NOTE — Patient Instructions (Signed)
Good to see  you today .   Glad no   major injury.  Concentrate   .Marland Kitchen.focusing and balance exercises.  No further work up needed  Continue pulmonary rehab .

## 2023-01-05 ENCOUNTER — Ambulatory Visit (INDEPENDENT_AMBULATORY_CARE_PROVIDER_SITE_OTHER): Payer: Medicare Other

## 2023-01-05 ENCOUNTER — Encounter (HOSPITAL_COMMUNITY)
Admission: RE | Admit: 2023-01-05 | Discharge: 2023-01-05 | Disposition: A | Discharge status: Home / Self Care | Payer: Medicare Other | Source: Ambulatory Visit | Attending: Pulmonary Disease | Admitting: Pulmonary Disease

## 2023-01-05 DIAGNOSIS — J984 Other disorders of lung: Secondary | ICD-10-CM | POA: Diagnosis not present

## 2023-01-05 DIAGNOSIS — M810 Age-related osteoporosis without current pathological fracture: Secondary | ICD-10-CM

## 2023-01-05 NOTE — Progress Notes (Signed)
Daily Session Note  Patient Details  Name: [name] [name] MRN: [id number] Date of Birth: 29-Nov-1940 Referring Provider:   Doristine Devoid Pulmonary Rehab Walk Test from 12/28/2022 in [facility] for Heart, Vascular, & Lung Health  Referring Provider [name]       Encounter Date: 01/05/2023  Check In:  Session Check In - 01/05/23 1109       Check-In   Supervising physician immediately available to respond to emergencies [facility] MD immediately available    Physician(s) Micah Flesher, NP    Location [facility]    Staff Present Essie Hart, RN, BSN;Taliana Mersereau Idelle Crouch BS, ACSM-CEP, Exercise Physiologist;Kaylee Earlene Plater, MS, ACSM-CEP, Exercise Physiologist;Casey Katrinka Blazing, RT    Virtual Visit No    Medication changes reported     No    Fall or balance concerns reported    No    Tobacco Cessation No Change    Warm-up and Cool-down Performed as group-led instruction    Resistance Training Performed Yes    VAD Patient? No    PAD/SET Patient? No      Pain Assessment   Currently in Pain? No/denies    Multiple Pain Sites No             Capillary Blood Glucose: No results found for this or any previous visit (from the past 24 hour(s)).    Social History   Tobacco Use  Smoking Status Never  Smokeless Tobacco Never    Goals Met:  Exercise tolerated well No report of concerns or symptoms today Strength training completed today  Goals Unmet:  Not Applicable  Comments: Service time is from 1020 to 1159.    Dr. Mechele Collin is Medical Director for Pulmonary Rehab at [facility].

## 2023-01-10 ENCOUNTER — Encounter (HOSPITAL_COMMUNITY): Admission: RE | Admit: 2023-01-10 | Payer: Medicare Other | Source: Ambulatory Visit

## 2023-01-10 DIAGNOSIS — J984 Other disorders of lung: Secondary | ICD-10-CM

## 2023-01-10 NOTE — Progress Notes (Signed)
Daily Session Note  Patient Details  Name: [name] [name] MRN: [id number] Date of Birth: 1940-07-26 Referring Provider:   Doristine Devoid Pulmonary Rehab Walk Test from 12/28/2022 in [facility] for Heart, Vascular, & Lung Health  Referring Provider [name]       Encounter Date: 01/10/2023  Check In:  Session Check In - 01/10/23 1045       Check-In   Supervising physician immediately available to respond to emergencies [facility] MD immediately available    Physician(s) Edd Fabian, NP    Location [facility]    Staff Present Samantha Belarus, RD, Dutch Gray, RN, BSN;Randi Reeve BS, ACSM-CEP, Exercise Physiologist;Zakira Ressel Earlene Plater, MS, ACSM-CEP, Exercise Physiologist;Casey Katrinka Blazing, RT    Virtual Visit No    Medication changes reported     No    Fall or balance concerns reported    No    Tobacco Cessation No Change    Warm-up and Cool-down Performed as group-led instruction    Resistance Training Performed Yes    VAD Patient? No    PAD/SET Patient? No      Pain Assessment   Currently in Pain? No/denies    Multiple Pain Sites No             Capillary Blood Glucose: No results found for this or any previous visit (from the past 24 hour(s)).    Social History   Tobacco Use  Smoking Status Never  Smokeless Tobacco Never    Goals Met:  Proper associated with RPD/PD & O2 Sat Exercise tolerated well No report of concerns or symptoms today Strength training completed today  Goals Unmet:  Not Applicable  Comments: Service time is from 1013 to 1135.    Dr. Mechele Collin is Medical Director for Pulmonary Rehab at [facility].

## 2023-01-11 DIAGNOSIS — J310 Chronic rhinitis: Secondary | ICD-10-CM | POA: Diagnosis not present

## 2023-01-11 DIAGNOSIS — J323 Chronic sphenoidal sinusitis: Secondary | ICD-10-CM | POA: Diagnosis not present

## 2023-01-11 DIAGNOSIS — J321 Chronic frontal sinusitis: Secondary | ICD-10-CM | POA: Diagnosis not present

## 2023-01-11 DIAGNOSIS — R0982 Postnasal drip: Secondary | ICD-10-CM | POA: Diagnosis not present

## 2023-01-12 ENCOUNTER — Encounter (HOSPITAL_COMMUNITY)
Admission: RE | Admit: 2023-01-12 | Discharge: 2023-01-12 | Disposition: A | Discharge status: Home / Self Care | Payer: Medicare Other | Source: Ambulatory Visit | Attending: Pulmonary Disease | Admitting: Pulmonary Disease

## 2023-01-12 VITALS — Wt 118.8 lb

## 2023-01-12 DIAGNOSIS — J984 Other disorders of lung: Secondary | ICD-10-CM | POA: Diagnosis not present

## 2023-01-12 NOTE — Progress Notes (Signed)
Daily Session Note  Patient Details  Name: [name] [name] MRN: [id number] Date of Birth: 1941/06/27 Referring Provider:   Doristine Devoid Pulmonary Rehab Walk Test from 12/28/2022 in [facility] for Heart, Vascular, & Lung Health  Referring Provider [name]       Encounter Date: 01/12/2023  Check In:  Session Check In - 01/12/23 1025       Check-In   Supervising physician immediately available to respond to emergencies [facility] MD immediately available    Physician(s) Bernadene Person, NP    [facility]    Staff Present Samantha Belarus, RD, Dutch Gray, RN, BSN;Randi Reeve BS, ACSM-CEP, Exercise Physiologist;Saraia Platner Earlene Plater, MS, ACSM-CEP, Exercise Physiologist;Casey Katrinka Blazing, RT    Virtual Visit No    Medication changes reported     Yes    Comments started on doxycycline    Fall or balance concerns reported    No    Tobacco Cessation No Change    Warm-up and Cool-down Performed as group-led instruction    Resistance Training Performed Yes    VAD Patient? No    PAD/SET Patient? No      Pain Assessment   Currently in Pain? No/denies    Multiple Pain Sites No             Capillary Blood Glucose: No results found for this or any previous visit (from the past 24 hour(s)).    Social History   Tobacco Use  Smoking Status Never  Smokeless Tobacco Never    Goals Met:  Proper associated with RPD/PD & O2 Sat Exercise tolerated well No report of concerns or symptoms today Strength training completed today  Goals Unmet:  Not Applicable  Comments: Service time is from 1012 to 1148.    Dr. Mechele Collin is Medical Director for Pulmonary Rehab at [facility].

## 2023-01-17 ENCOUNTER — Encounter (HOSPITAL_COMMUNITY)
Admission: RE | Admit: 2023-01-17 | Discharge: 2023-01-17 | Disposition: A | Discharge status: Home / Self Care | Payer: Medicare Other | Source: Ambulatory Visit | Attending: Pulmonary Disease | Admitting: Pulmonary Disease

## 2023-01-17 DIAGNOSIS — J984 Other disorders of lung: Secondary | ICD-10-CM

## 2023-01-17 NOTE — Progress Notes (Signed)
Daily Session Note  Patient Details  Name: [name] [name] MRN: [id number] Date of Birth: 10/31/1940 Referring Provider:   Doristine Devoid Pulmonary Rehab Walk Test from 12/28/2022 in [facility] for Heart, Vascular, & Lung Health  Referring Provider [name]       Encounter Date: 01/17/2023  Check In:  Session Check In - 01/17/23 0954       Check-In   Supervising physician immediately available to respond to emergencies [facility] MD immediately available    Physician(s) Bernadene Person, NP    [facility]    Staff Present Essie Hart, RN, Doris Cheadle, MS, ACSM-CEP, Exercise Physiologist;Caylea Foronda Katrinka Blazing, RT    Virtual Visit No    Medication changes reported     Yes    Comments started on doxycycline    Fall or balance concerns reported    No    Tobacco Cessation No Change    Warm-up and Cool-down Performed as group-led instruction    Resistance Training Performed Yes    VAD Patient? No    PAD/SET Patient? No      Pain Assessment   Currently in Pain? No/denies    Pain Score 0-No pain    Multiple Pain Sites No             Capillary Blood Glucose: No results found for this or any previous visit (from the past 24 hour(s)).    Social History   Tobacco Use  Smoking Status Never  Smokeless Tobacco Never    Goals Met:  Proper associated with RPD/PD & O2 Sat Independence with exercise equipment Exercise tolerated well No report of concerns or symptoms today Strength training completed today  Goals Unmet:  Not Applicable  Comments: Service time is from 1020 to 1150.    Dr. Mechele Collin is Medical Director for Pulmonary Rehab at [facility].

## 2023-01-17 NOTE — Progress Notes (Deleted)
Daily Session Note  Patient Details  Name: [name] [name] MRN: [id number] Date of Birth: 03/11/1941 Referring Provider:   Doristine Devoid Pulmonary Rehab Walk Test from 12/28/2022 in [facility] for Heart, Vascular, & Lung Health  Referring Provider Uvaldo Rising  [[place]]       Encounter Date: 01/12/2023  Check In:   Capillary Blood Glucose: No results found for this or any previous visit (from the past 24 hour(s)).   Exercise Prescription Changes - 01/17/23 1200       Response to Exercise   Blood Pressure (Admit) 116/66    Blood Pressure (Exercise) 150/70    Blood Pressure (Exit) 110/78    Heart Rate (Admit) 86 bpm    Heart Rate (Exercise) 103 bpm    Heart Rate (Exit) 85 bpm    Oxygen Saturation (Admit) 98 %    Oxygen Saturation (Exercise) 96 %    Oxygen Saturation (Exit) 97 %    Rating of Perceived Exertion (Exercise) 13    Perceived Dyspnea (Exercise) 0    Duration Continue with 30 min of aerobic exercise without signs/symptoms of physical distress.    Intensity THRR unchanged      Resistance Training   Training Prescription Yes    Weight red bands    Reps 10-15    Time 10 Minutes      Recumbant Bike   Level 3    Minutes 15    METs 2      NuStep   Level 2    Minutes 15    METs 1.8             Social History   Tobacco Use  Smoking Status Never  Smokeless Tobacco Never    Goals Met:  Proper associated with RPD/PD & O2 Sat Independence with exercise equipment Exercise tolerated well No report of concerns or symptoms today Strength training completed today  Goals Unmet:  Not Applicable  Comments: Service time is from 1020 to 1150.    Dr. Mechele Collin is Medical Director for Pulmonary Rehab at [facility].

## 2023-01-19 ENCOUNTER — Encounter (HOSPITAL_COMMUNITY)
Admission: RE | Admit: 2023-01-19 | Discharge: 2023-01-19 | Disposition: A | Discharge status: Home / Self Care | Payer: Medicare Other | Source: Ambulatory Visit | Attending: Pulmonary Disease | Admitting: Pulmonary Disease

## 2023-01-19 DIAGNOSIS — J984 Other disorders of lung: Secondary | ICD-10-CM | POA: Diagnosis not present

## 2023-01-19 NOTE — Progress Notes (Signed)
Daily Session Note  Patient Details  Name: [name] [name] MRN: [id number] Date of Birth: Jun 03, 1941 Referring Provider:   Doristine Devoid Pulmonary Rehab Walk Test from 12/28/2022 in [facility] for Heart, Vascular, & Lung Health  Referring Provider [name]       Encounter Date: 01/19/2023  Check In:  Session Check In - 01/19/23 1132       Check-In   Supervising physician immediately available to respond to emergencies [facility] MD immediately available    Physician(s) Eligha Bridegroom, NP    [facility]    Staff Present Essie Hart, RN, Doris Cheadle, MS, ACSM-CEP, Exercise Physiologist;Valaree Fresquez Marcille Buffy, RN, BSN    Virtual Visit No    Medication changes reported     No    Fall or balance concerns reported    No    Tobacco Cessation No Change    Warm-up and Cool-down Performed as group-led instruction    Resistance Training Performed Yes    VAD Patient? No    PAD/SET Patient? No      Pain Assessment   Currently in Pain? No/denies    Pain Score 0-No pain    Multiple Pain Sites No             Capillary Blood Glucose: No results found for this or any previous visit (from the past 24 hour(s)).    Social History   Tobacco Use  Smoking Status Never  Smokeless Tobacco Never    Goals Met:  Proper associated with RPD/PD & O2 Sat Independence with exercise equipment Exercise tolerated well No report of concerns or symptoms today Strength training completed today  Goals Unmet:  Not Applicable  Comments: Service time is from 1020 to 1150.    Dr. Mechele Collin is Medical Director for Pulmonary Rehab at [facility]

## 2023-01-24 ENCOUNTER — Encounter (HOSPITAL_COMMUNITY): Admission: RE | Admit: 2023-01-24 | Payer: Medicare Other | Source: Ambulatory Visit

## 2023-01-24 DIAGNOSIS — J984 Other disorders of lung: Secondary | ICD-10-CM

## 2023-01-24 NOTE — Progress Notes (Signed)
Daily Session Note  Patient Details  Name: [name] [name] MRN: [id number] Date of Birth: 05-01-1941 Referring Provider:   Doristine Devoid Pulmonary Rehab Walk Test from 12/28/2022 in [facility] for Heart, Vascular, & Lung Health  Referring Provider [name]       Encounter Date: 01/24/2023  Check In:  Session Check In - 01/24/23 1230       Check-In   Supervising physician immediately available to respond to emergencies [facility] MD immediately available    Physician(s) Robin Searing, NP    [facility]    Staff Present Essie Hart, RN, BSN;Casey Smith, RT;Carlette Les Pou, RN, BSN;Khadeejah Castner BS, ACSM-CEP, Exercise Physiologist;Samantha Belarus, RD, LDN    Virtual Visit No    Medication changes reported     No    Fall or balance concerns reported    No    Tobacco Cessation No Change    Warm-up and Cool-down Performed as group-led instruction    Resistance Training Performed Yes    VAD Patient? No    PAD/SET Patient? No      Pain Assessment   Currently in Pain? No/denies             Capillary Blood Glucose: No results found for this or any previous visit (from the past 24 hour(s)).    Social History   Tobacco Use  Smoking Status Never  Smokeless Tobacco Never    Goals Met:  Independence with exercise equipment Exercise tolerated well No report of concerns or symptoms today Strength training completed today  Goals Unmet:  Not Applicable  Comments: Service time is from 1023 to 1150.    Dr. Mechele Collin is Medical Director for Pulmonary Rehab at [facility].

## 2023-01-26 ENCOUNTER — Encounter (HOSPITAL_COMMUNITY)
Admission: RE | Admit: 2023-01-26 | Discharge: 2023-01-26 | Disposition: A | Discharge status: Home / Self Care | Payer: Medicare Other | Source: Ambulatory Visit | Attending: Pulmonary Disease | Admitting: Pulmonary Disease

## 2023-01-26 VITALS — Wt 119.7 lb

## 2023-01-26 DIAGNOSIS — J984 Other disorders of lung: Secondary | ICD-10-CM | POA: Insufficient documentation

## 2023-01-26 NOTE — Progress Notes (Signed)
Daily Session Note  Patient Details  Name: [name] [name] MRN: [id number] Date of Birth: 04/24/1941 Referring Provider:   Doristine Devoid Pulmonary Rehab Walk Test from 12/28/2022 in [facility] for Heart, Vascular, & Lung Health  Referring Provider [name]       Encounter Date: 01/26/2023  Check In:  Session Check In - 01/26/23 1138       Check-In   Supervising physician immediately available to respond to emergencies [facility] MD immediately available    Physician(s) Carlyon Shadow, NP    [facility]    Staff Present Elissa Lovett BS, ACSM-CEP, Exercise Physiologist;Samantha Belarus, RD, Dutch Gray, RN, BSN;Casey Smith, RT;Jetta Walker BS, ACSM-CEP, Exercise Physiologist    Virtual Visit No    Medication changes reported     No    Fall or balance concerns reported    No    Tobacco Cessation No Change    Warm-up and Cool-down Performed as group-led instruction    Resistance Training Performed Yes    VAD Patient? No    PAD/SET Patient? No      Pain Assessment   Currently in Pain? No/denies    Multiple Pain Sites No             Capillary Blood Glucose: No results found for this or any previous visit (from the past 24 hour(s)).    Social History   Tobacco Use  Smoking Status Never  Smokeless Tobacco Never    Goals Met:  Independence with exercise equipment Exercise tolerated well No report of concerns or symptoms today Strength training completed today  Goals Unmet:  Not Applicable  Comments: Service time is from 1013 to 1153    Dr. Mechele Collin is Medical Director for Pulmonary Rehab at [facility].

## 2023-01-31 ENCOUNTER — Encounter (HOSPITAL_COMMUNITY): Payer: Medicare Other

## 2023-02-01 NOTE — Progress Notes (Signed)
Pulmonary Individual Treatment Plan  Patient Details  Name: [name] [name] MRN: [id number] Date of Birth: 06-05-1941 Referring Provider:   Doristine Devoid Pulmonary Rehab Walk Test from 12/28/2022 in [facility]  Referring Provider McNeil  [Ellison]       Initial Encounter Date:  Flowsheet Row Pulmonary Rehab Walk Test from 12/28/2022 in [facility]  Date 12/28/22       Visit Diagnosis: Other disorders of lung  Patient's Home Medications on Admission:   Current Outpatient Medications:    albuterol (VENTOLIN HFA) 108 (90 Base) MCG/ACT inhaler, SMARTSIG:2 Inhalation Via Inhaler Every 6 Hours PRN, Disp: , Rfl:    amLODipine (NORVASC) 5 MG tablet, Take 1 tablet (5 mg total) by mouth daily., Disp: 90 tablet, Rfl: 3   aspirin 325 MG EC tablet, Take 325 mg by mouth every 6 (six) hours as needed for pain., Disp: , Rfl:    b complex vitamins tablet, Take 1 tablet by mouth daily., Disp: , Rfl:    budesonide-formoterol (SYMBICORT) 80-4.5 MCG/ACT inhaler, Inhale 2 puffs into the lungs 2 (two) times daily., Disp: 1 each, Rfl: 2   CALCIUM PO, Take 1,200 mg by mouth daily. , Disp: , Rfl:    denosumab (PROLIA) 60 MG/ML SOLN injection, Inject 60 mg into the skin every 6 (six) months. Administer in upper arm, thigh, or abdomen, Disp: , Rfl:    dextromethorphan-guaiFENesin (MUCINEX DM) 30-600 MG 12hr tablet, Take 1 tablet by mouth 2 (two) times daily as needed for cough., Disp: 12 tablet, Rfl: 3   fexofenadine (ALLEGRA) 60 MG tablet, Take 1 tablet (60 mg total) by mouth daily for 15 days., Disp: 15 tablet, Rfl: 0   fluticasone (FLONASE) 50 MCG/ACT nasal spray, Place 2 sprays into both nostrils daily., Disp: 18.2 g, Rfl: 0   ipratropium (ATROVENT) 0.06 % nasal spray, Place into the nose., Disp: , Rfl:    levothyroxine (SYNTHROID) 100 MCG tablet, TAKE 1 TABLET(100 MCG) BY MOUTH DAILY, Disp: 90  tablet, Rfl: 2   pantoprazole (PROTONIX) 40 MG tablet, Take 1 tablet (40 mg total) by mouth in the morning. (Patient taking differently: Take 20 mg by mouth in the morning.), Disp: 30 tablet, Rfl: 0   Spacer/Aero-Holding Chambers (EASIVENT) inhaler, See admin instructions., Disp: , Rfl:   Past Medical History: Past Medical History:  Diagnosis Date   COVID 10/25/2020   Dog bite of thigh 04/09/2012   Elevated lipids    GERD (gastroesophageal reflux disease)    Hypertension    Hypothyroidism    post thyroidectomy   Osteoarthritis    Osteopenia    t -2.5 hip in 2005 by Dexa   Osteoporosis    commpr fx after dog related fall  injury   Seasonal rhinitis    spring and fall   Skin cancer    squamous cell ca chest   Vaginal prolapse    hs corrected    Tobacco Use: Social History   Tobacco Use  Smoking Status Never  Smokeless Tobacco Never    Labs: Review Flowsheet  More data exists      Latest Ref Rng & Units 05/11/2018 05/14/2019 09/23/2020 08/30/2021 07/20/2022  Labs for ITP Cardiac and Pulmonary Rehab  Cholestrol 0 - 200 mg/dL 102  725  366  440  347   LDL (calc) 0 - 99 mg/dL 425  956  387  564  332   HDL-C >  39.00 mg/dL 09.81  19.14  78.29  56.21  68.10   Trlycerides 0.0 - 149.0 mg/dL 30.8  65.7  84.6  962.9  121.0   Hemoglobin A1c 4.6 - 6.5 % - - 5.8  - -    Details            Capillary Blood Glucose: No results found for: "GLUCAP"   Pulmonary Assessment Scores:  Pulmonary Assessment Scores     Row Name 12/28/22 1058         ADL UCSD   ADL Phase Entry     SOB Score total 11       CAT Score   CAT Score 7       mMRC Score   mMRC Score 3             UCSD: Self-administered rating of dyspnea associated with activities of daily living (ADLs) 6-point scale (0 = "not at all" to 5 = "maximal or unable to do because of breathlessness")  Scoring Scores range from 0 to 120.  Minimally important difference is 5 units  CAT: CAT can identify the health  impairment of COPD patients and is better correlated with disease progression.  CAT has a scoring range of zero to 40. The CAT score is classified into four groups of low (less than 10), medium (10 - 20), high (21-30) and very high (31-40) based on the impact level of disease on health status. A CAT score over 10 suggests significant symptoms.  A worsening CAT score could be explained by an exacerbation, poor medication adherence, poor inhaler technique, or progression of COPD or comorbid conditions.  CAT MCID is 2 points  mMRC: mMRC (Modified Medical Research Council) Dyspnea Scale is used to assess the degree of baseline functional disability in patients of respiratory disease due to dyspnea. No minimal important difference is established. A decrease in score of 1 point or greater is considered a positive change.   Pulmonary Function Assessment:  Pulmonary Function Assessment - 12/28/22 1132       Breath   Bilateral Breath Sounds --             Exercise Target Goals: Exercise Program Goal: Individual exercise prescription set using results from initial 6 min walk test and THRR while considering  patient's activity barriers and safety.   Exercise Prescription Goal: Initial exercise prescription builds to 30-45 minutes a day of aerobic activity, 2-3 days per week.  Home exercise guidelines will be given to patient during program as part of exercise prescription that the participant will acknowledge.  Activity Barriers & Risk Stratification:  Activity Barriers & Cardiac Risk Stratification - 12/28/22 1102       Activity Barriers & Cardiac Risk Stratification   Activity Barriers Deconditioning;Muscular Weakness;Shortness of Breath;History of Falls;Arthritis;Neck/Spine Problems             6 Minute Walk:  6 Minute Walk     Row Name 12/28/22 1203         6 Minute Walk   Phase Initial     Distance 610 feet     Walk Time 6 minutes     # of Rest Breaks 4  4 rest breaks  at: 1:46-2:07, 2:49-3:05, 3:49-4:32, 5:18-5:42     MPH 1.16     METS 1.63     RPE 13     Perceived Dyspnea  3     VO2 Peak 5.69     Symptoms Yes (comment)  Comments dyspnea     Resting HR 89 bpm     Resting BP 122/76     Resting Oxygen Saturation  98 %     Exercise Oxygen Saturation  during 6 min walk 86 %     Max Ex. HR 141 bpm     Max Ex. BP 140/82     2 Minute Post BP 122/78       Interval HR   1 Minute HR 114     2 Minute HR 116     3 Minute HR 129     4 Minute HR 141     5 Minute HR 135     6 Minute HR 130     2 Minute Post HR 103     Interval Heart Rate? Yes       Interval Oxygen   Interval Oxygen? Yes     Baseline Oxygen Saturation % 98 %     1 Minute Oxygen Saturation % 86 %  at 1:46 O2 dropped to 86%, PLB right up to 91%     1 Minute Liters of Oxygen 0 L     2 Minute Oxygen Saturation % 95 %     2 Minute Liters of Oxygen 0 L     3 Minute Oxygen Saturation % 86 %  2:49 O2 dropped to 86%, PLB up to 91%     3 Minute Liters of Oxygen 0 L     4 Minute Oxygen Saturation % 94 %     4 Minute Liters of Oxygen 0 L     5 Minute Oxygen Saturation % 92 %     5 Minute Liters of Oxygen 0 L     6 Minute Oxygen Saturation % 90 %     6 Minute Liters of Oxygen 0 L     2 Minute Post Oxygen Saturation % 94 %     2 Minute Post Liters of Oxygen 0 L              Oxygen Initial Assessment:  Oxygen Initial Assessment - 12/28/22 1058       Home Oxygen   Home Oxygen Device None    Sleep Oxygen Prescription None    Home Exercise Oxygen Prescription None    Home Resting Oxygen Prescription None      Initial 6 min Walk   Oxygen Used None      Program Oxygen Prescription   Program Oxygen Prescription None      Intervention   Short Term Goals To learn and understand importance of maintaining oxygen saturations>88%;To learn and demonstrate proper use of respiratory medications;To learn and understand importance of monitoring SPO2 with pulse oximeter and demonstrate  accurate use of the pulse oximeter.;To learn and demonstrate proper pursed lip breathing techniques or other breathing techniques.     Long  Term Goals Verbalizes importance of monitoring SPO2 with pulse oximeter and return demonstration;Maintenance of O2 saturations>88%;Exhibits proper breathing techniques, such as pursed lip breathing or other method taught during program session;Compliance with respiratory medication             Oxygen Re-Evaluation:  Oxygen Re-Evaluation     Row Name 12/30/22 1028 01/20/23 1325           Program Oxygen Prescription   Program Oxygen Prescription None None        Home Oxygen   Home Oxygen Device None None      Sleep Oxygen Prescription None None  Home Exercise Oxygen Prescription None None      Home Resting Oxygen Prescription None None        Goals/Expected Outcomes   Short Term Goals To learn and understand importance of maintaining oxygen saturations>88%;To learn and demonstrate proper use of respiratory medications;To learn and understand importance of monitoring SPO2 with pulse oximeter and demonstrate accurate use of the pulse oximeter.;To learn and demonstrate proper pursed lip breathing techniques or other breathing techniques.  To learn and understand importance of maintaining oxygen saturations>88%;To learn and demonstrate proper use of respiratory medications;To learn and understand importance of monitoring SPO2 with pulse oximeter and demonstrate accurate use of the pulse oximeter.;To learn and demonstrate proper pursed lip breathing techniques or other breathing techniques.       Long  Term Goals Verbalizes importance of monitoring SPO2 with pulse oximeter and return demonstration;Maintenance of O2 saturations>88%;Exhibits proper breathing techniques, such as pursed lip breathing or other method taught during program session;Compliance with respiratory medication Verbalizes importance of monitoring SPO2 with pulse oximeter and return  demonstration;Maintenance of O2 saturations>88%;Exhibits proper breathing techniques, such as pursed lip breathing or other method taught during program session;Compliance with respiratory medication      Goals/Expected Outcomes Compliance and understanding of oxygen saturation monitoring and breathing techniques to decrease shortness of breath. Compliance and understanding of oxygen saturation monitoring and breathing techniques to decrease shortness of breath.               Oxygen Discharge (Final Oxygen Re-Evaluation):  Oxygen Re-Evaluation - 01/20/23 1325       Program Oxygen Prescription   Program Oxygen Prescription None      Home Oxygen   Home Oxygen Device None    Sleep Oxygen Prescription None    Home Exercise Oxygen Prescription None    Home Resting Oxygen Prescription None      Goals/Expected Outcomes   Short Term Goals To learn and understand importance of maintaining oxygen saturations>88%;To learn and demonstrate proper use of respiratory medications;To learn and understand importance of monitoring SPO2 with pulse oximeter and demonstrate accurate use of the pulse oximeter.;To learn and demonstrate proper pursed lip breathing techniques or other breathing techniques.     Long  Term Goals Verbalizes importance of monitoring SPO2 with pulse oximeter and return demonstration;Maintenance of O2 saturations>88%;Exhibits proper breathing techniques, such as pursed lip breathing or other method taught during program session;Compliance with respiratory medication    Goals/Expected Outcomes Compliance and understanding of oxygen saturation monitoring and breathing techniques to decrease shortness of breath.             Initial Exercise Prescription:  Initial Exercise Prescription - 12/28/22 1200       Date of Initial Exercise RX and Referring Provider   Date 12/28/22    Referring Provider [name]   Expected Discharge Date 03/23/23      Recumbant Bike   Level 1     RPM 30    Minutes 15    METs 1.5      NuStep   Level 1    SPM 50    Minutes 15    METs 1.5      Prescription Details   Frequency (times per week) 2    Duration Progress to 30 minutes of continuous aerobic without signs/symptoms of physical distress      Intensity   THRR 40-80% of Max Heartrate 56-111    Ratings of Perceived Exertion 11-13    Perceived Dyspnea 0-4  Progression   Progression Continue to progress workloads to maintain intensity without signs/symptoms of physical distress.      Resistance Training   Training Prescription Yes    Weight red bands    Reps 10-15             Perform Capillary Blood Glucose checks as needed.  Exercise Prescription Changes:   Exercise Prescription Changes     Row Name 01/03/23 1200 01/17/23 1200 01/31/23 1200         Response to Exercise   Blood Pressure (Admit) 124/70 116/66 140/72     Blood Pressure (Exercise) 142/68 150/70 --     Blood Pressure (Exit) 118/60 110/78 108/60     Heart Rate (Admit) 89 bpm 86 bpm 97 bpm     Heart Rate (Exercise) 101 bpm 103 bpm 111 bpm     Heart Rate (Exit) 93 bpm 85 bpm 97 bpm     Oxygen Saturation (Admit) 97 % 98 % 98 %     Oxygen Saturation (Exercise) 94 % 96 % 93 %     Oxygen Saturation (Exit) 96 % 97 % 97 %     Rating of Perceived Exertion (Exercise) 89 13 15     Perceived Dyspnea (Exercise) 101 0 1     Symptoms 93 -- --     Duration Progress to 30 minutes of  aerobic without signs/symptoms of physical distress Continue with 30 min of aerobic exercise without signs/symptoms of physical distress. Continue with 30 min of aerobic exercise without signs/symptoms of physical distress.     Intensity THRR unchanged THRR unchanged THRR unchanged       Progression   Progression Continue to progress workloads to maintain intensity without signs/symptoms of physical distress. -- Continue to progress workloads to maintain intensity without signs/symptoms of physical distress.      Average METs -- -- 2.4       Resistance Training   Training Prescription Yes Yes Yes     Weight red bands red bands red bands     Reps 10-15 10-15 10-15     Time 10 Minutes 10 Minutes 10 Minutes       Recumbant Bike   Level 1 3 4      Minutes 15 15 15      METs 1.7 2 2.4       NuStep   Level 1 2 3      Minutes 15 15 15      METs 1.6 1.8 1.9              Exercise Comments:   Exercise Goals and Review:   Exercise Goals     Row Name 12/28/22 1103 01/20/23 1320           Exercise Goals   Increase Physical Activity Yes Yes      Intervention Provide advice, education, support and counseling about physical activity/exercise needs.;Develop an individualized exercise prescription for aerobic and resistive training based on initial evaluation findings, risk stratification, comorbidities and participant's personal goals. Provide advice, education, support and counseling about physical activity/exercise needs.;Develop an individualized exercise prescription for aerobic and resistive training based on initial evaluation findings, risk stratification, comorbidities and participant's personal goals.      Expected Outcomes Short Term: Attend rehab on a regular basis to increase amount of physical activity.;Long Term: Exercising regularly at least 3-5 days a week.;Long Term: Add in home exercise to make exercise part of routine and to increase amount of physical activity. Short Term: Attend  rehab on a regular basis to increase amount of physical activity.;Long Term: Exercising regularly at least 3-5 days a week.;Long Term: Add in home exercise to make exercise part of routine and to increase amount of physical activity.      Increase Strength and Stamina Yes Yes      Intervention Provide advice, education, support and counseling about physical activity/exercise needs.;Develop an individualized exercise prescription for aerobic and resistive training based on initial evaluation findings, risk  stratification, comorbidities and participant's personal goals. Provide advice, education, support and counseling about physical activity/exercise needs.;Develop an individualized exercise prescription for aerobic and resistive training based on initial evaluation findings, risk stratification, comorbidities and participant's personal goals.      Expected Outcomes Short Term: Increase workloads from initial exercise prescription for resistance, speed, and METs.;Short Term: Perform resistance training exercises routinely during rehab and add in resistance training at home;Long Term: Improve cardiorespiratory fitness, muscular endurance and strength as measured by increased METs and functional capacity ( ) Short Term: Increase workloads from initial exercise prescription for resistance, speed, and METs.;Short Term: Perform resistance training exercises routinely during rehab and add in resistance training at home;Long Term: Improve cardiorespiratory fitness, muscular endurance and strength as measured by increased METs and functional capacity ( )      Able to understand and use rate of perceived exertion (RPE) scale Yes Yes      Intervention Provide education and explanation on how to use RPE scale Provide education and explanation on how to use RPE scale      Expected Outcomes Short Term: Able to use RPE daily in rehab to express subjective intensity level;Long Term:  Able to use RPE to guide intensity level when exercising independently Short Term: Able to use RPE daily in rehab to express subjective intensity level;Long Term:  Able to use RPE to guide intensity level when exercising independently      Able to understand and use Dyspnea scale Yes Yes      Intervention Provide education and explanation on how to use Dyspnea scale Provide education and explanation on how to use Dyspnea scale      Expected Outcomes Short Term: Able to use Dyspnea scale daily in rehab to express subjective sense of  shortness of breath during exertion;Long Term: Able to use Dyspnea scale to guide intensity level when exercising independently Short Term: Able to use Dyspnea scale daily in rehab to express subjective sense of shortness of breath during exertion;Long Term: Able to use Dyspnea scale to guide intensity level when exercising independently      Knowledge and understanding of Target Heart Rate Range (THRR) Yes Yes      Intervention Provide education and explanation of THRR including how the numbers were predicted and where they are located for reference Provide education and explanation of THRR including how the numbers were predicted and where they are located for reference      Expected Outcomes Short Term: Able to state/look up THRR;Long Term: Able to use THRR to govern intensity when exercising independently;Short Term: Able to use daily as guideline for intensity in rehab Short Term: Able to state/look up THRR;Long Term: Able to use THRR to govern intensity when exercising independently;Short Term: Able to use daily as guideline for intensity in rehab      Understanding of Exercise Prescription Yes Yes      Intervention Provide education, explanation, and written materials on patient's individual exercise prescription Provide education, explanation, and written materials on patient's individual exercise prescription  Expected Outcomes Short Term: Able to explain program exercise prescription;Long Term: Able to explain home exercise prescription to exercise independently Short Term: Able to explain program exercise prescription;Long Term: Able to explain home exercise prescription to exercise independently               Exercise Goals Re-Evaluation :  Exercise Goals Re-Evaluation     Row Name 12/30/22 1025 01/20/23 1320           Exercise Goal Re-Evaluation   Exercise Goals Review Increase Physical Activity;Able to understand and use Dyspnea scale;Understanding of Exercise  Prescription;Increase Strength and Stamina;Knowledge and understanding of Target Heart Rate Range (THRR);Able to understand and use rate of perceived exertion (RPE) scale Increase Physical Activity;Able to understand and use Dyspnea scale;Understanding of Exercise Prescription;Increase Strength and Stamina;Knowledge and understanding of Target Heart Rate Range (THRR);Able to understand and use rate of perceived exertion (RPE) scale      Comments [name] [name] is scheduled to begin exercsise not week. Will continue to monitor and progress as able. [name] [name] has completed 6 exercise sessions. She exercises for 15 min on the recumbent bike and Nustep. [name] [name] averages 2.4 METs at level 3 on the recumbent bike and 1.8 METs at level 2 on the Nustep. [name] [name] performs the warmup and cooldown standing without limitations. She has increased her workload for both exercises modes as she tolerates progressions well. METs have increased for both exercise modes. [name] [name] is starting to understand her ExRx more. She seems motivated to exercise and increase her functional capacity. She would get short of breath when she started rehab. Her shortness of breath has improved with exercise. Will continue to monitor and progress as able.      Expected Outcomes Through exercise at rehab and home, the patient will decrease shortness of breath with daily activities and feel confident in carrying out an exercise regimen at home. Through exercise at rehab and home, the patient will decrease shortness of breath with daily activities and feel confident in carrying out an exercise regimen at home.               Discharge Exercise Prescription (Final Exercise Prescription Changes):  Exercise Prescription Changes - 01/31/23 1200       Response to Exercise   Blood Pressure (Admit) 140/72    Blood Pressure (Exit) 108/60    Heart Rate (Admit) 97 bpm    Heart Rate (Exercise) 111 bpm    Heart Rate (Exit) 97 bpm    Oxygen Saturation (Admit) 98 %     Oxygen Saturation (Exercise) 93 %    Oxygen Saturation (Exit) 97 %    Rating of Perceived Exertion (Exercise) 15    Perceived Dyspnea (Exercise) 1    Duration Continue with 30 min of aerobic exercise without signs/symptoms of physical distress.    Intensity THRR unchanged      Progression   Progression Continue to progress workloads to maintain intensity without signs/symptoms of physical distress.    Average METs 2.4      Resistance Training   Training Prescription Yes    Weight red bands    Reps 10-15    Time 10 Minutes      Recumbant Bike   Level 4    Minutes 15    METs 2.4      NuStep   Level 3    Minutes 15    METs 1.9             Nutrition:  Target Goals: Understanding  of nutrition guidelines, daily intake of sodium 1500mg , cholesterol 200mg , calories 30% from fat and 7% or less from saturated fats, daily to have 5 or more servings of fruits and vegetables.  Biometrics:  Pre Biometrics - 12/28/22 1209       Pre Biometrics   Grip Strength 12 kg              Nutrition Therapy Plan and Nutrition Goals:  Nutrition Therapy & Goals - 01/31/23 1521       Nutrition Therapy   Diet General Healthy Diet    Drug/Food Interactions Statins/Certain Fruits      Personal Nutrition Goals   Nutrition Goal Patient to identify strategies for weight maintenance/weight gain as needed throughout Pulmonary Rehab.   goal not met.   Comments Goal not met. [name] [name] reports history of unwanted weight loss over the last two year; per documentation, she was 150# July of 2023. Unwanted weight loss has been worked up through GI with normal findings. At this point, she does not want to gain weight but would like to maintain her current weight. She does not cook. She reports prioriziting protein foods such as eggs, protein bars, Ensure and then typically eats protein/vegetable based meal at dinner meal. [name] [name] is down 1.76# since starting with our program; reviewed strategies for weight  gain/weight maintenance including nutrition supplements, increasing eating frequency, increasing calories from fat/carbohydrates/protein, etc. [name] [name] will benefit from from participation in pulmonary rehab for nutrition, exercise, and lifestyle modification.      Intervention Plan   Intervention Prescribe, educate and counsel regarding individualized specific dietary modifications aiming towards targeted core components such as weight, hypertension, lipid management, diabetes, heart failure and other comorbidities.;Nutrition handout(s) given to patient.    Expected Outcomes Short Term Goal: Understand basic principles of dietary content, such as calories, fat, sodium, cholesterol and nutrients.;Long Term Goal: Adherence to prescribed nutrition plan.             Nutrition Assessments:  Nutrition Assessments - 01/03/23 1111       Rate Your Plate Scores   Pre Score 63            MEDIFICTS Score Key: ?70 Need to make dietary changes  40-70 Heart Healthy Diet ? 40 Therapeutic Level Cholesterol Diet  Flowsheet Row PULMONARY REHAB OTHER RESPIRATORY from 01/03/2023 in [facility] ([facility]) for Heart, Vascular, & Lung Health  Picture Your Plate Total Score on Admission 63      Picture Your Plate Scores: <16 Unhealthy dietary pattern with much room for improvement. 41-50 Dietary pattern unlikely to meet recommendations for good health and room for improvement. 51-60 More healthful dietary pattern, with some room for improvement.  >60 Healthy dietary pattern, although there may be some specific behaviors that could be improved.    Nutrition Goals Re-Evaluation:  Nutrition Goals Re-Evaluation     Row Name 01/03/23 1101 01/31/23 1521           Goals   Current Weight 120 lb 5.9 oz (54.6 kg) 119 lb 11.4 oz (54.3 kg)      Comment cholesterol 230, LDL 138 (cholesterol is treated with "low fat diet" per provider notes) no new labs; most recent labs  cholesterol 230, LDL  138 (cholesterol is treated with "low fat diet" per provider notes)      Expected Outcome [name] [name] reports history of unwanted weight loss over the last two year; per documentation, she was 150# July of 2023. Unwanted weight loss has been worked  up through GI with normal findings.  At this point, she does not want to gain weight but would like to maintain her current weight. She does not cook. She reports prioriziting protein foods such as eggs, protein bars, Ensure and then typically eats protein, vegetable based meal at dinner meal. [name] [name] will benefit from from participation in pulmonary rehab for nutrition, exercise, and lifestyle modification. Goal not met. [name] [name] reports history of unwanted weight loss over the last two year; per documentation, she was 150# July of 2023. Unwanted weight loss has been worked up through GI with normal findings. At this point, she does not want to gain weight but would like to maintain her current weight. She does not cook. She reports prioriziting protein foods such as eggs, protein bars, Ensure and then typically eats protein/vegetable based meal at dinner meal. [name] [name] is down 1.76# since starting with our program; reviewed strategies for weight gain/weight maintenance including nutrition supplements, increasing eating frequency, increasing calories from fat/carbohydrates/protein, etc. [name] [name] will benefit from from participation in pulmonary rehab for nutrition, exercise, and lifestyle modification               Nutrition Goals Discharge (Final Nutrition Goals Re-Evaluation):  Nutrition Goals Re-Evaluation - 01/31/23 1521       Goals   Current Weight 119 lb 11.4 oz (54.3 kg)    Comment no new labs; most recent labs  cholesterol 230, LDL 138 (cholesterol is treated with "low fat diet" per provider notes)    Expected Outcome Goal not met. [name] [name] reports history of unwanted weight loss over the last two year; per documentation, she was 150# July of 2023. Unwanted weight  loss has been worked up through GI with normal findings. At this point, she does not want to gain weight but would like to maintain her current weight. She does not cook. She reports prioriziting protein foods such as eggs, protein bars, Ensure and then typically eats protein/vegetable based meal at dinner meal. [name] [name] is down 1.76# since starting with our program; reviewed strategies for weight gain/weight maintenance including nutrition supplements, increasing eating frequency, increasing calories from fat/carbohydrates/protein, etc. [name] [name] will benefit from from participation in pulmonary rehab for nutrition, exercise, and lifestyle modification             Psychosocial: Target Goals: Acknowledge presence or absence of significant depression and/or stress, maximize coping skills, provide positive support system. Participant is able to verbalize types and ability to use techniques and skills needed for reducing stress and depression.  Initial Review & Psychosocial Screening:  Initial Psych Review & Screening - 12/28/22 1054       Initial Review   Current issues with None Identified      Family Dynamics   Good Support System? Yes    Comments son      Barriers   Psychosocial barriers to participate in program There are no identifiable barriers or psychosocial needs.             Quality of Life Scores:  Scores of 19 and below usually indicate a poorer quality of life in these areas.  A difference of  2-3 points is a clinically meaningful difference.  A difference of 2-3 points in the total score of the Quality of Life Index has been associated with significant improvement in overall quality of life, self-image, physical symptoms, and general health in studies assessing change in quality of life.  PHQ-9: Review Flowsheet  More data exists      12/28/2022 11/28/2022  07/20/2022 07/14/2022 12/29/2021  Depression screen PHQ 2/9  Decreased Interest 0 0 0 0 0  Down, Depressed, Hopeless 0 0 0  0 0  PHQ - 2 Score 0 0 0 0 0  Altered sleeping 1 - 0 0 0  Tired, decreased energy 1 - 2 1 1   Change in appetite 1 - 1 0 0  Feeling bad or failure about yourself  0 - 0 0 0  Trouble concentrating 0 - 0 0 0  Moving slowly or fidgety/restless 0 - 0 0 0  Suicidal thoughts 0 - 0 0 0  PHQ-9 Score 3 - 3 1 1   Difficult doing work/chores Not difficult at all - Not difficult at all Somewhat difficult Not difficult at all    Details           Interpretation of Total Score  Total Score Depression Severity:  1-4 = Minimal depression, 5-9 = Mild depression, 10-14 = Moderate depression, 15-19 = Moderately severe depression, 20-27 = Severe depression   Psychosocial Evaluation and Intervention:  Psychosocial Evaluation - 12/28/22 1055       Psychosocial Evaluation & Interventions   Interventions Encouraged to exercise with the program and follow exercise prescription    Comments [name] [name] denies any psychosocal barriers to exercise.    Expected Outcomes For [name] [name] to participate in PR free of psychosocial barriers.    Continue Psychosocial Services  No Follow up required             Psychosocial Re-Evaluation:  Psychosocial Re-Evaluation     Row Name 01/02/23 0934 01/27/23 1044           Psychosocial Re-Evaluation   Current issues with None Identified None Identified      Comments No new psychosocial barriers or concerns since orientation on 7/3 [name] [name] denies any psychosocial barriers or concerns at this time.      Expected Outcomes For [name] [name] to participate in PR free of any psychosocial barriers or concerns For [name] [name] to participate in PR free of any psychosocial barriers or concerns      Interventions Encouraged to attend Pulmonary Rehabilitation for the exercise Encouraged to attend Pulmonary Rehabilitation for the exercise      Continue Psychosocial Services  No Follow up required No Follow up required               Psychosocial Discharge (Final Psychosocial Re-Evaluation):   Psychosocial Re-Evaluation - 01/27/23 1044       Psychosocial Re-Evaluation   Current issues with None Identified    Comments [name] [name] denies any psychosocial barriers or concerns at this time.    Expected Outcomes For [name] [name] to participate in PR free of any psychosocial barriers or concerns    Interventions Encouraged to attend Pulmonary Rehabilitation for the exercise    Continue Psychosocial Services  No Follow up required             Education: Education Goals: Education classes will be provided on a weekly basis, covering required topics. Participant will state understanding/return demonstration of topics presented.  Learning Barriers/Preferences:  Learning Barriers/Preferences - 12/28/22 1055       Learning Barriers/Preferences   Learning Barriers Sight    Learning Preferences Computer/Internet;Written Material;Pictoral             Education Topics: Introduction to Pulmonary Rehab Group instruction provided by PowerPoint, verbal discussion, and written material to support subject matter. Instructor reviews what Pulmonary Rehab is, the purpose of the program, and how patients are referred.  Know Your Numbers Group instruction that is supported by a PowerPoint presentation. Instructor discusses importance of knowing and understanding resting, exercise, and post-exercise oxygen saturation, heart rate, and blood pressure. Oxygen saturation, heart rate, blood pressure, rating of perceived exertion, and dyspnea are reviewed along with a normal range for these values.  Flowsheet Row PULMONARY REHAB OTHER RESPIRATORY from 01/05/2023 in [facility] for Heart, Vascular, & Lung Health  Date 01/05/23  Educator EP  Instruction Review Code 1- Verbalizes Understanding       Exercise for the Pulmonary Patient Group instruction that is supported by a PowerPoint presentation. Instructor discusses benefits of exercise, core components of exercise, frequency,  duration, and intensity of an exercise routine, importance of utilizing pulse oximetry during exercise, safety while exercising, and options of places to exercise outside of rehab.       MET Level  Group instruction provided by PowerPoint, verbal discussion, and written material to support subject matter. Instructor reviews what METs are and how to increase METs.    Pulmonary Medications Verbally interactive group education provided by instructor with focus on inhaled medications and proper administration.   Anatomy and Physiology of the Respiratory System Group instruction provided by PowerPoint, verbal discussion, and written material to support subject matter. Instructor reviews respiratory cycle and anatomical components of the respiratory system and their functions. Instructor also reviews differences in obstructive and restrictive respiratory diseases with examples of each.    Oxygen Safety Group instruction provided by PowerPoint, verbal discussion, and written material to support subject matter. There is an overview of "What is Oxygen" and "Why do we need it".  Instructor also reviews how to create a safe environment for oxygen use, the importance of using oxygen as prescribed, and the risks of noncompliance. There is a brief discussion on traveling with oxygen and resources the patient may utilize. Flowsheet Row PULMONARY REHAB OTHER RESPIRATORY from 01/12/2023 in [facility] for Heart, Vascular, & Lung Health  Date 01/12/23  Educator RN  Instruction Review Code 1- Verbalizes Understanding       Oxygen Use Group instruction provided by PowerPoint, verbal discussion, and written material to discuss how supplemental oxygen is prescribed and different types of oxygen supply systems. Resources for more information are provided.  Flowsheet Row PULMONARY REHAB OTHER RESPIRATORY from 01/19/2023 in [facility] for Heart, Vascular, & Lung  Health  Date 01/19/23  Educator RT  Instruction Review Code 1- Verbalizes Understanding       Breathing Techniques Group instruction that is supported by demonstration and informational handouts. Instructor discusses the benefits of pursed lip and diaphragmatic breathing and detailed demonstration on how to perform both.  Flowsheet Row PULMONARY REHAB OTHER RESPIRATORY from 01/26/2023 in [facility] for Heart, Vascular, & Lung Health  Date 01/26/23  Educator RN  Instruction Review Code 1- Verbalizes Understanding        Risk Factor Reduction Group instruction that is supported by a PowerPoint presentation. Instructor discusses the definition of a risk factor, different risk factors for pulmonary disease, and how the heart and lungs work together.   MD Day A group question and answer session with a medical doctor that allows participants to ask questions that relate to their pulmonary disease state.   Nutrition for the Pulmonary Patient Group instruction provided by PowerPoint slides, verbal discussion, and written materials to support subject matter. The instructor gives an explanation and review of healthy diet recommendations, which includes a  discussion on weight management, recommendations for fruit and vegetable consumption, as well as protein, fluid, caffeine, fiber, sodium, sugar, and alcohol. Tips for eating when patients are short of breath are discussed.    Other Education Group or individual verbal, written, or video instructions that support the educational goals of the pulmonary rehab program.    Knowledge Questionnaire Score:   Core Components/Risk Factors/Patient Goals at Admission:  Personal Goals and Risk Factors at Admission - 12/28/22 1056       Core Components/Risk Factors/Patient Goals on Admission    Weight Management Weight Gain;Yes    Intervention Weight Management: Develop a combined nutrition and exercise program designed to  reach desired caloric intake, while maintaining appropriate intake of nutrient and fiber, sodium and fats, and appropriate energy expenditure required for the weight goal.;Weight Management: Provide education and appropriate resources to help participant work on and attain dietary goals.    Expected Outcomes Short Term: Continue to assess and modify interventions until short term weight is achieved;Long Term: Adherence to nutrition and physical activity/exercise program aimed toward attainment of established weight goal;Weight Maintenance: Understanding of the daily nutrition guidelines, which includes 25-35% calories from fat, 7% or less cal from saturated fats, less than 200mg  cholesterol, less than 1.5gm of sodium, & 5 or more servings of fruits and vegetables daily;Weight Gain: Understanding of general recommendations for a high calorie, high protein meal plan that promotes weight gain by distributing calorie intake throughout the day with the consumption for 4-5 meals, snacks, and/or supplements;Understanding of distribution of calorie intake throughout the day with the consumption of 4-5 meals/snacks;Understanding recommendations for meals to include 15-35% energy as protein, 25-35% energy from fat, 35-60% energy from carbohydrates, less than 200mg  of dietary cholesterol, 20-35 gm of total fiber daily    Improve shortness of breath with ADL's Yes    Intervention Provide education, individualized exercise plan and daily activity instruction to help decrease symptoms of SOB with activities of daily living.    Expected Outcomes Short Term: Improve cardiorespiratory fitness to achieve a reduction of symptoms when performing ADLs;Long Term: Be able to perform more ADLs without symptoms or delay the onset of symptoms             Core Components/Risk Factors/Patient Goals Review:   Goals and Risk Factor Review     Row Name 01/02/23 0936 01/27/23 1045           Core Components/Risk Factors/Patient  Goals Review   Personal Goals Review Weight Management/Obesity;Improve shortness of breath with ADL's;Develop more efficient breathing techniques such as purse lipped breathing and diaphragmatic breathing and practicing self-pacing with activity. Weight Management/Obesity;Improve shortness of breath with ADL's;Develop more efficient breathing techniques such as purse lipped breathing and diaphragmatic breathing and practicing self-pacing with activity.      Review [name] [name] is scheduled to start the program on 7/9. We will monitor her progress throughout the program. Goal progressing for weight gain. [name] [name] is working with staff dietician to achieve weight gain goals. Goal progressing for improving shortness of breath with ADL's. Goal met on developing more efficient breathing techniques such as purse lipped breathing and diaphragmatic breathing; and practicing self-pacing with activity. We will continue to monitor [name] [name]'s progress throughout the program.      Expected Outcomes See admission goals See admission goals               Core Components/Risk Factors/Patient Goals at Discharge (Final Review):   Goals and Risk Factor Review - 01/27/23 1045  Core Components/Risk Factors/Patient Goals Review   Personal Goals Review Weight Management/Obesity;Improve shortness of breath with ADL's;Develop more efficient breathing techniques such as purse lipped breathing and diaphragmatic breathing and practicing self-pacing with activity.    Review Goal progressing for weight gain. [name] [name] is working with staff dietician to achieve weight gain goals. Goal progressing for improving shortness of breath with ADL's. Goal met on developing more efficient breathing techniques such as purse lipped breathing and diaphragmatic breathing; and practicing self-pacing with activity. We will continue to monitor [name] [name]'s progress throughout the program.    Expected Outcomes See admission goals             ITP  Comments:Pt is making expected progress toward Pulmonary Rehab goals after completing 8 sessions. Recommend continued exercise, life style modification, education, and utilization of breathing techniques to increase stamina and strength, while also decreasing shortness of breath with exertion.  Dr. Mechele Collin is Medical Director for Pulmonary Rehab at [facility].     Comments: Dr. Mechele Collin is Medical Director for Pulmonary Rehab at [facility].

## 2023-02-02 ENCOUNTER — Encounter (HOSPITAL_COMMUNITY)
Admission: RE | Admit: 2023-02-02 | Discharge: 2023-02-02 | Disposition: A | Discharge status: Home / Self Care | Payer: Medicare Other | Source: Ambulatory Visit | Attending: Pulmonary Disease | Admitting: Pulmonary Disease

## 2023-02-02 DIAGNOSIS — J984 Other disorders of lung: Secondary | ICD-10-CM | POA: Diagnosis not present

## 2023-02-02 NOTE — Progress Notes (Signed)
Daily Session Note  Patient Details  Name: Haydyn Mackins MRN: [id number] Date of Birth: 15-Jun-1941 Referring Provider:   Doristine Devoid Pulmonary Rehab Walk Test from 12/28/2022 in [facility] for Heart, Vascular, & Lung Health  Referring Provider [name]       Encounter Date: 02/02/2023  Check In:  Session Check In - 02/02/23 1047       Check-In   Supervising physician immediately available to respond to emergencies [facility] MD immediately available    Physician(s) Joni Reining, NP    Location [facility]    Staff Present Elissa Lovett BS, ACSM-CEP, Exercise Physiologist;Mary Gerre Scull, RN, BSN;Ravyn Nikkel, RT;Jetta Walker BS, ACSM-CEP, Exercise Physiologist;Bailey Wallace Cullens, MS, Exercise Physiologist    Virtual Visit No    Medication changes reported     No    Fall or balance concerns reported    No    Tobacco Cessation No Change    Warm-up and Cool-down Performed as group-led instruction    Resistance Training Performed Yes    VAD Patient? No    PAD/SET Patient? No      Pain Assessment   Currently in Pain? No/denies    Pain Score 0-No pain    Multiple Pain Sites No             Capillary Blood Glucose: No results found for this or any previous visit (from the past 24 hour(s)).    Social History   Tobacco Use  Smoking Status Never  Smokeless Tobacco Never    Goals Met:  Proper associated with RPD/PD & O2 Sat Independence with exercise equipment Exercise tolerated well No report of concerns or symptoms today Strength training completed today  Goals Unmet:  Not Applicable  Comments: Service time is from 1005 to 1145.    Dr. Mechele Collin is Medical Director for Pulmonary Rehab at [facility].

## 2023-02-03 ENCOUNTER — Telehealth (HOSPITAL_COMMUNITY): Payer: Self-pay

## 2023-02-03 DIAGNOSIS — M533 Sacrococcygeal disorders, not elsewhere classified: Secondary | ICD-10-CM | POA: Diagnosis not present

## 2023-02-03 DIAGNOSIS — G8929 Other chronic pain: Secondary | ICD-10-CM | POA: Diagnosis not present

## 2023-02-03 DIAGNOSIS — R293 Abnormal posture: Secondary | ICD-10-CM | POA: Diagnosis not present

## 2023-02-03 DIAGNOSIS — M545 Low back pain, unspecified: Secondary | ICD-10-CM | POA: Diagnosis not present

## 2023-02-03 DIAGNOSIS — Z7409 Other reduced mobility: Secondary | ICD-10-CM | POA: Diagnosis not present

## 2023-02-03 NOTE — Telephone Encounter (Signed)
Called Joe to discuss [name] [name]'s progress in Pulmonary Rehab. Joe was pleased to hear about her progress and wanted know what's the best care for improving her breathing. I mentioned asking for physical therapy referral to help treat her kyphosis. He agreed with me.

## 2023-02-06 DIAGNOSIS — J370 Chronic laryngitis: Secondary | ICD-10-CM | POA: Diagnosis not present

## 2023-02-06 DIAGNOSIS — J383 Other diseases of vocal cords: Secondary | ICD-10-CM | POA: Diagnosis not present

## 2023-02-06 DIAGNOSIS — R490 Dysphonia: Secondary | ICD-10-CM | POA: Diagnosis not present

## 2023-02-06 NOTE — Progress Notes (Unsigned)
Cardiology Office Note   Date:  02/08/2023  ID:  [name] [name], DOB October 06, 1940, MRN [id number] PCP:  Madelin Headings, MD [facility] Cardiologist: Maisie Fus, MD  Reason for visit: Follow-up  History of Present Illness    [name] [name] is a 82 y.o. female with a hx of hypertension, hypothyroidism and GERD.  She last saw Dr. Wyline Mood in May 2023.  She noted decreased exercise tolerance.  Exercise stress test was ordered.  No ischemia noted.  Patient did have a hypertensive response.  Recommended to start chlorthalidone 25 mg daily and Norvasc 5 mg daily.  She was followed up in the hypertension clinic.  Blood pressure was well-controlled on amlodipine 5 mg daily alone.  She is riding stationary bike 3 times per 30 minutes.  Over the past year, she is followed up with [facility] pulmonology for shortness of breath when walking up hills.  She had negative allergy testing.  She complained of postnasal drip and sinusitis.  She had an echo 04/2022 at [facility] with normal EF, mild LVH, mild MR, no RV dysfunction.  CT scanning showed middle lobe atelectasis which may be related to kyphosis per pulmonary.  Pulm recommended core exercises, incentive spirometry, Symbicort and pulmonary rehab.  Today, she is accompanied by her son.  They explained that patient's had drastic decreased exercise tolerance since her last visit in May 2023.  They have undergone an extensive workup and treatment for sinusitis, seeing ENT, a larynx specialist, speech therapy and pulmonary.  Despite treatment, she continues to have shortness of breath with exertion.  They state this seems out of proportion just 2/2 to her right middle lobe atelectasis secondary to her kyphosis.  Son states patient has made some progress with pulmonary rehab.  They are also working on increasing her water and caloric intake.  She has lost 5 to 6 pounds in the past 2 months.  Patient denies chest pain and palpitations.  They mention ankle swelling.   Patient is not on diuretics.  State patient will not wear compression stockings.  Denies high salt intake.  Also concerned of increased rest heart rate 90-100.   Objective / Physical Exam   EKG today: NSR with PAC, HR 87  Vital signs:  BP 128/64 (BP Location: Right Arm, Patient Position: Sitting, Cuff Size: Normal)   Pulse 87   Ht 5' (1.524 m)   Wt 117 lb (53.1 kg)   SpO2 94%   BMI 22.85 kg/m     GEN: No acute distress NECK: No carotid bruits CARDIAC: RRR, no murmurs RESPIRATORY:  Clear to auscultation without rales, wheezing or rhonchi  EXTREMITIES: 1+ bilateral edema to right above ankles  Assessment and Plan   Dyspnea on exertion Decreased exercise tolerance -Followed by [facility] pulmonology -Echo November 2023 with normal EF, no severe valve disease -EKG today with NSR with PAC -Will perform CTA of the coronaries to rule out ischemic disease.  Check BMET. -Otherwise encourage continuing pulmonary rehab, hydration and increase caloric intake.  Continue regular activity and routine. -If cardiac workup is negative, her son wonders if her kyphosis is playing a bigger role in compressing her abdomen and airspace, causing decreased appetite and shortness of breath.  Hypertension, well controlled. -Continue amlodipine 5 mg daily. -Goal BP is <130/80.  Recommend DASH diet (high in vegetables, fruits, low-fat dairy products, whole grains, poultry, fish, and nuts and low in sweets, sugar-sweetened beverages, and red meats), salt restriction and increase physical activity.  Lower extremity swelling -Possibly  due to venous insufficiency -Recommend leg elevation, compression, increase walking. -Prescribed Lasix 20 mg as needed for worsening leg swelling.  If using regularly, we would need to follow-up renal function.  Cautious given age.  Notify us if any lightheadedness or low BP.        Disposition - Follow-up pending CTA results.   Signed, Cannon Kettle, PA-C  02/08/2023 [facility]

## 2023-02-07 ENCOUNTER — Encounter (HOSPITAL_COMMUNITY)
Admission: RE | Admit: 2023-02-07 | Discharge: 2023-02-07 | Disposition: A | Discharge status: Home / Self Care | Payer: Medicare Other | Source: Ambulatory Visit | Attending: Pulmonary Disease | Admitting: Pulmonary Disease

## 2023-02-07 DIAGNOSIS — J984 Other disorders of lung: Secondary | ICD-10-CM

## 2023-02-07 NOTE — Progress Notes (Signed)
Daily Session Note  Patient Details  Name: [name] [name] MRN: [id number] Date of Birth: 1941/04/25 Referring Provider:   Doristine Devoid Pulmonary Rehab Walk Test from 12/28/2022 in [facility], V[facility], & [facility]  Referring Provider [name]       Encounter Date: 02/07/2023  Check In:  Session Check In - 02/07/23 1102       Check-In   Supervising physician immediately available to respond to emergencies [facility] MD immediately available    Physician(s) Jari Favre, PA    Location [facility]    Staff Present Raford Pitcher, MS, ACSM-CEP, Exercise Physiologist;Randi Dionisio Paschal, ACSM-CEP, Exercise Physiologist;Samantha Belarus, RD, Dutch Gray, RN, Fuller Plan, RT    Virtual Visit No    Medication changes reported     No    Fall or balance concerns reported    No    Tobacco Cessation No Change    Warm-up and Cool-down Performed as group-led instruction    Resistance Training Performed Yes    VAD Patient? No    PAD/SET Patient? No      Pain Assessment   Currently in Pain? No/denies    Multiple Pain Sites No             Capillary Blood Glucose: No results found for this or any previous visit (from the past 24 hour(s)).    Social History   Tobacco Use  Smoking Status Never  Smokeless Tobacco Never    Goals Met:  Proper associated with RPD/PD & O2 Sat Exercise tolerated well No report of concerns or symptoms today Strength training completed today  Goals Unmet:  Not Applicable  Comments: Service time is from 1019 to 1140.    Dr. Mechele Collin is Medical Director for Pulmonary Rehab at [facility].

## 2023-02-08 ENCOUNTER — Ambulatory Visit: Payer: Medicare Other | Attending: Physician Assistant | Admitting: Physician Assistant

## 2023-02-08 ENCOUNTER — Encounter: Payer: Self-pay | Admitting: Physician Assistant

## 2023-02-08 VITALS — BP 128/64 | HR 87 | Ht 60.0 in | Wt 117.0 lb

## 2023-02-08 DIAGNOSIS — R600 Localized edema: Secondary | ICD-10-CM | POA: Insufficient documentation

## 2023-02-08 DIAGNOSIS — R0609 Other forms of dyspnea: Secondary | ICD-10-CM | POA: Diagnosis not present

## 2023-02-08 DIAGNOSIS — R9431 Abnormal electrocardiogram [ECG] [EKG]: Secondary | ICD-10-CM | POA: Insufficient documentation

## 2023-02-08 DIAGNOSIS — I10 Essential (primary) hypertension: Secondary | ICD-10-CM | POA: Diagnosis not present

## 2023-02-08 MED ORDER — METOPROLOL TARTRATE 100 MG PO TABS: 100.0000 mg | ORAL_TABLET | Freq: Once | ORAL | 0 refills | Status: DC

## 2023-02-08 MED ORDER — FUROSEMIDE 20 MG PO TABS: 20.0000 mg | ORAL_TABLET | Freq: Every day | ORAL | 3 refills | Status: DC | PRN

## 2023-02-08 NOTE — Patient Instructions (Addendum)
Medication Instructions:  Your physician has recommended you make the following change in your medication:   -Start furosemide (lasix) 20mg  as needed daily for leg swelling.  *If you need a refill on your cardiac medications before your next appointment, please call your pharmacy*   Lab Work: Your physician recommends that you have labs drawn today: BMET  If you have labs (blood work) drawn today and your tests are completely normal, you will receive your results only by: MyChart Message (if you have MyChart) OR A paper copy in the mail If you have any lab test that is abnormal or we need to change your treatment, we will call you to review the results.   Testing/Procedures: See below   Follow-Up: At [facility], you and your health needs are our priority.  As part of our continuing mission to provide you with exceptional heart care, we have created designated Provider Care Teams.  These Care Teams include your primary Cardiologist (physician) and Advanced Practice Providers (APPs -  Physician Assistants and Nurse Practitioners) who all work together to provide you with the care you need, when you need it.  We recommend signing up for the patient portal called "MyChart".  Sign up information is provided on this After Visit Summary.  MyChart is used to connect with patients for Virtual Visits (Telemedicine).  Patients are able to view lab/test results, encounter notes, upcoming appointments, etc.  Non-urgent messages can be sent to your provider as well.   To learn more about what you can do with MyChart, go to [email or web address].    Your next appointment:   To be determined- based on results of coronary CTA   Provider:   Juanda Crumble, PA-C       Other Instructions   Your cardiac CT will be scheduled at the below location:   [facility] [address] [place], [place] [address] [phone]   If scheduled at [facility], please  arrive at the [facility] and Children's Entrance (Entrance C2) of [facility] 30 minutes prior to test start time. You can use the FREE valet parking offered at entrance C (encouraged to control the heart rate for the test)  Proceed to the [facility] Radiology Department (first floor) to check-in and test prep.  All radiology patients and guests should use entrance C2 at [facility], accessed from [facility], even though the hospital's physical address listed is [address].    Please follow these instructions carefully (unless otherwise directed):  An IV will be required for this test and Nitroglycerin will be given.  On the Night Before the Test: Be sure to Drink plenty of water. Do not consume any caffeinated/decaffeinated beverages or chocolate 12 hours prior to your test. Do not take any antihistamines 12 hours prior to your test.  On the Day of the Test: Drink plenty of water until 1 hour prior to the test. Do not eat any food 1 hour prior to test. You may take your regular medications prior to the test.  Take metoprolol (Lopressor) two hours prior to test. If you take Furosemide/Hydrochlorothiazide/Spironolactone, please HOLD on the morning of the test. FEMALES- please wear underwire-free bra if available, avoid dresses & tight clothing      After the Test: Drink plenty of water. After receiving IV contrast, you may experience a mild flushed feeling. This is normal. On occasion, you may experience a mild rash up to 24 hours after the test.  This is not dangerous. If this occurs, you can take Benadryl 25 mg and increase your fluid intake. If you experience trouble breathing, this can be serious. If it is severe call 911 IMMEDIATELY. If it is mild, please call our office. If you take any of these medications: Glipizide/Metformin, Avandament, Glucavance, please do not take 48 hours after completing test unless otherwise instructed.  We will call  to schedule your test 2-4 weeks out understanding that some insurance companies will need an authorization prior to the service being performed.   For more information and frequently asked questions, please visit our website : [email or web address]  For non-scheduling related questions, please contact the cardiac imaging nurse navigator should you have any questions/concerns: Cardiac Imaging Nurse Navigators Direct Office Dial: [phone]   For scheduling needs, including cancellations and rescheduling, please call [place], [phone].

## 2023-02-09 ENCOUNTER — Encounter (HOSPITAL_COMMUNITY)
Admission: RE | Admit: 2023-02-09 | Discharge: 2023-02-09 | Disposition: A | Discharge status: Home / Self Care | Payer: Medicare Other | Source: Ambulatory Visit | Attending: Pulmonary Disease | Admitting: Pulmonary Disease

## 2023-02-09 DIAGNOSIS — J984 Other disorders of lung: Secondary | ICD-10-CM | POA: Diagnosis not present

## 2023-02-09 LAB — BASIC METABOLIC PANEL
BUN/Creatinine Ratio: 28 (ref 12–28)
BUN: 12 mg/dL (ref 8–27)
CO2: 25 mmol/L (ref 20–29)
Calcium: 8.8 mg/dL (ref 8.7–10.3)
Chloride: 102 mmol/L (ref 96–106)
Creatinine, Ser: 0.43 mg/dL — ABNORMAL LOW (ref 0.57–1.00)
Glucose: 83 mg/dL (ref 70–99)
Potassium: 4.2 mmol/L (ref 3.5–5.2)
Sodium: 143 mmol/L (ref 134–144)
eGFR: 98 mL/min/{1.73_m2} (ref >59)

## 2023-02-09 NOTE — Progress Notes (Signed)
Daily Session Note  Patient Details  Name: Remilynn Rickels MRN: [id number] Date of Birth: 1941-02-07 Referring Provider:   Doristine Devoid Pulmonary Rehab Walk Test from 12/28/2022 in [facility] for Heart, Vascular, & Lung Health  Referring Provider [name]       Encounter Date: 02/09/2023  Check In:  Session Check In - 02/09/23 1132       Check-In   Supervising physician immediately available to respond to emergencies [facility] MD immediately available    Physician(s) Edd Fabian, NP    [facility]    Staff Present Raford Pitcher, MS, ACSM-CEP, Exercise Physiologist;Randi Dionisio Paschal, ACSM-CEP, Exercise Physiologist;Samantha Belarus, RD, Dutch Gray, RN, BSN;Casey Smith, RT;Jetta Walker BS, ACSM-CEP, Exercise Physiologist    Virtual Visit No    Medication changes reported     No    Fall or balance concerns reported    No    Tobacco Cessation No Change    Warm-up and Cool-down Performed as group-led instruction    Resistance Training Performed Yes    VAD Patient? No    PAD/SET Patient? No      Pain Assessment   Currently in Pain? No/denies             Capillary Blood Glucose: No results found for this or any previous visit (from the past 24 hour(s)).    Social History   Tobacco Use  Smoking Status Never  Smokeless Tobacco Never    Goals Met:  Exercise tolerated well No report of concerns or symptoms today Strength training completed today  Goals Unmet:  Not Applicable  Comments: Service time is from 1015 to 1150    Dr. Mechele Collin is Medical Director for Pulmonary Rehab at [facility].

## 2023-02-14 ENCOUNTER — Encounter (HOSPITAL_COMMUNITY): Payer: Self-pay

## 2023-02-14 ENCOUNTER — Encounter (HOSPITAL_COMMUNITY)
Admission: RE | Admit: 2023-02-14 | Discharge: 2023-02-14 | Disposition: A | Discharge status: Home / Self Care | Payer: Medicare Other | Source: Ambulatory Visit | Attending: Pulmonary Disease | Admitting: Pulmonary Disease

## 2023-02-14 VITALS — Wt 116.2 lb

## 2023-02-14 DIAGNOSIS — J984 Other disorders of lung: Secondary | ICD-10-CM

## 2023-02-14 NOTE — Progress Notes (Signed)
Daily Session Note  Patient Details  Name: [name] [name] MRN: [id number] Date of Birth: Apr 01, 1941 Referring Provider:   Doristine Devoid Pulmonary Rehab Walk Test from 12/28/2022 in [facility] for Heart, Vascular, & Lung Health  Referring Provider [name]       Encounter Date: 02/14/2023  Check In:  Session Check In - 02/14/23 1027       Check-In   Supervising physician immediately available to respond to emergencies [facility] MD immediately available    Physician(s) Edd Fabian, NP    [facility]    Staff Present Raford Pitcher, MS, ACSM-CEP, Exercise Physiologist;Samantha Belarus, RD, Dutch Gray, RN, Fuller Plan, RT    Virtual Visit No    Medication changes reported     No    Fall or balance concerns reported    No    Tobacco Cessation No Change    Warm-up and Cool-down Performed as group-led instruction    Resistance Training Performed Yes    VAD Patient? No    PAD/SET Patient? No      Pain Assessment   Currently in Pain? No/denies    Multiple Pain Sites No             Capillary Blood Glucose: No results found for this or any previous visit (from the past 24 hour(s)).   Exercise Prescription Changes - 02/14/23 1200       Response to Exercise   Blood Pressure (Admit) 108/64    Blood Pressure (Exercise) 158/78    Blood Pressure (Exit) 120/68    Heart Rate (Admit) 91 bpm    Heart Rate (Exercise) 106 bpm    Heart Rate (Exit) 96 bpm    Oxygen Saturation (Admit) 95 %    Oxygen Saturation (Exercise) 94 %    Oxygen Saturation (Exit) 96 %    Rating of Perceived Exertion (Exercise) 13    Perceived Dyspnea (Exercise) 1    Duration Continue with 30 min of aerobic exercise without signs/symptoms of physical distress.    Intensity THRR unchanged      Progression   Progression Continue to progress workloads to maintain intensity without signs/symptoms of physical distress.      Resistance Training    Training Prescription Yes    Weight red bands    Reps 10-15    Time 10 Minutes      NuStep   Level 3    SPM 74    Minutes 15    METs 1.9      Track   Laps 7    Minutes 15    METs 2.08             Social History   Tobacco Use  Smoking Status Never  Smokeless Tobacco Never    Goals Met:  Independence with exercise equipment Exercise tolerated well No report of concerns or symptoms today Strength training completed today  Goals Unmet:  Not Applicable  Comments: Service time is from 1012 to 1131    Dr. Mechele Collin is Medical Director for Pulmonary Rehab at [facility].

## 2023-02-16 ENCOUNTER — Encounter (HOSPITAL_COMMUNITY)
Admission: RE | Admit: 2023-02-16 | Discharge: 2023-02-16 | Disposition: A | Discharge status: Home / Self Care | Payer: Medicare Other | Source: Ambulatory Visit | Attending: Pulmonary Disease | Admitting: Pulmonary Disease

## 2023-02-16 ENCOUNTER — Encounter (HOSPITAL_COMMUNITY): Payer: Self-pay

## 2023-02-16 DIAGNOSIS — J984 Other disorders of lung: Secondary | ICD-10-CM | POA: Diagnosis not present

## 2023-02-16 NOTE — Progress Notes (Signed)
Daily Session Note  Patient Details  Name: [name] [name] MRN: [id number] Date of Birth: [birth year] Referring Provider:   Doristine Devoid Pulmonary Rehab Walk Test from 12/28/2022 in [facility] for Heart, Vascular, & Lung Health  Referring Provider [name]       Encounter Date: 02/16/2023  Check In:  Session Check In - 02/16/23 1023       Check-In   Supervising physician immediately available to respond to emergencies [facility] immediately available    Physician(s) Robin Searing, NP    [facility]    Staff Present Raford Pitcher, MS, ACSM-CEP, Exercise Physiologist;Samantha Belarus, RD, Dutch Gray, RN, Fuller Plan, RT    Virtual Visit No    Medication changes reported     No    Fall or balance concerns reported    No    Tobacco Cessation No Change    Warm-up and Cool-down Performed as group-led instruction    Resistance Training Performed Yes    VAD Patient? No    PAD/SET Patient? No      Pain Assessment   Currently in Pain? No/denies    Multiple Pain Sites No             Capillary Blood Glucose: No results found for this or any previous visit (from the past 24 hour(s)).    Social History   Tobacco Use  Smoking Status Never  Smokeless Tobacco Never    Goals Met:  Independence with exercise equipment Exercise tolerated well No report of concerns or symptoms today Strength training completed today  Goals Unmet:  Not Applicable  Comments: Service time is from 1013 to 1132    Dr. Mechele Collin is Medical Director for Pulmonary Rehab at [facility]

## 2023-02-17 ENCOUNTER — Telehealth (HOSPITAL_COMMUNITY): Payer: Self-pay | Admitting: *Deleted

## 2023-02-17 DIAGNOSIS — G527 Disorders of multiple cranial nerves: Secondary | ICD-10-CM | POA: Diagnosis not present

## 2023-02-17 DIAGNOSIS — R1310 Dysphagia, unspecified: Secondary | ICD-10-CM | POA: Diagnosis not present

## 2023-02-17 NOTE — Telephone Encounter (Signed)
Attempted to call patient regarding upcoming cardiac CT appointment. Left message on voicemail with name and callback number Hayley Sharpe RN Navigator Cardiac Imaging [facility] [phone] Office   

## 2023-02-20 ENCOUNTER — Ambulatory Visit (HOSPITAL_COMMUNITY)
Admission: RE | Admit: 2023-02-20 | Discharge: 2023-02-20 | Disposition: A | Discharge status: Home / Self Care | Payer: Medicare Other | Source: Ambulatory Visit | Attending: Physician Assistant | Admitting: Physician Assistant

## 2023-02-20 ENCOUNTER — Other Ambulatory Visit: Payer: Self-pay | Admitting: Physician Assistant

## 2023-02-20 DIAGNOSIS — R9431 Abnormal electrocardiogram [ECG] [EKG]: Secondary | ICD-10-CM

## 2023-02-20 DIAGNOSIS — R0609 Other forms of dyspnea: Secondary | ICD-10-CM | POA: Diagnosis not present

## 2023-02-20 DIAGNOSIS — I10 Essential (primary) hypertension: Secondary | ICD-10-CM

## 2023-02-20 DIAGNOSIS — R600 Localized edema: Secondary | ICD-10-CM

## 2023-02-20 MED ORDER — NITROGLYCERIN 0.4 MG SL SUBL
0.8000 mg | SUBLINGUAL_TABLET | Freq: Once | SUBLINGUAL | Status: AC
  Administered 2023-02-20: 0.8 mg via SUBLINGUAL

## 2023-02-20 MED ORDER — NITROGLYCERIN 0.4 MG SL SUBL
SUBLINGUAL_TABLET | SUBLINGUAL | Status: AC
  Filled 2023-02-20: qty 2

## 2023-02-21 ENCOUNTER — Telehealth: Payer: Self-pay

## 2023-02-21 ENCOUNTER — Encounter (HOSPITAL_COMMUNITY)
Admission: RE | Admit: 2023-02-21 | Discharge: 2023-02-21 | Disposition: A | Discharge status: Home / Self Care | Payer: Medicare Other | Source: Ambulatory Visit | Attending: Pulmonary Disease | Admitting: Pulmonary Disease

## 2023-02-21 VITALS — Wt 116.4 lb

## 2023-02-21 DIAGNOSIS — J984 Other disorders of lung: Secondary | ICD-10-CM

## 2023-02-21 DIAGNOSIS — R0609 Other forms of dyspnea: Secondary | ICD-10-CM

## 2023-02-21 NOTE — Telephone Encounter (Addendum)
Called patient to advised order for cardiac Pet will entered patient had understanding.----- Message from Cannon Kettle sent at 02/21/2023 12:39 PM EDT ----- Regarding: Order Cardiac PET Rob Bunting,   [name] [name] was unable to complete her CTA coronaries secondary to frequent PACs.  Please call patient and place order for cardiac PET, diagnosis: Dyspnea on exertion, precordial pain, decreased exercise tolerance.    Thank you! Britt Boozer

## 2023-02-21 NOTE — Progress Notes (Signed)
Daily Session Note  Patient Details  Name: [name] [name] MRN: [id number] Date of Birth: Jun 03, 1941 Referring Provider:   Doristine Devoid Pulmonary Rehab Walk Test from 12/28/2022 in [facility] for Heart, Vascular, & Lung Health  Referring Provider [name]       Encounter Date: 02/21/2023  Check In:  Session Check In - 02/21/23 1220       Check-In   Supervising physician immediately available to respond to emergencies [facility] MD immediately available    Physician(s) Carlos Levering, Np    [facility]    Staff Present Raford Pitcher, MS, ACSM-CEP, Exercise Physiologist;Samantha Belarus, RD, Dutch Gray, RN, BSN;Casey Smith, RT;Randi Reeve BS, ACSM-CEP, Exercise Physiologist    Virtual Visit No    Medication changes reported     No    Fall or balance concerns reported    No    Tobacco Cessation No Change    Warm-up and Cool-down Performed as group-led instruction    Resistance Training Performed Yes    VAD Patient? No    PAD/SET Patient? No      Pain Assessment   Currently in Pain? No/denies    Pain Score 0-No pain    Multiple Pain Sites No             Capillary Blood Glucose: No results found for this or any previous visit (from the past 24 hour(s)).    Social History   Tobacco Use  Smoking Status Never  Smokeless Tobacco Never    Goals Met:  Proper associated with RPD/PD & O2 Sat Exercise tolerated well No report of concerns or symptoms today Strength training completed today  Goals Unmet:  Not Applicable  Comments: Service time is from 1013 to 1146.    Dr. Mechele Collin is Medical Director for Pulmonary Rehab at [facility].

## 2023-02-22 DIAGNOSIS — R0602 Shortness of breath: Secondary | ICD-10-CM | POA: Diagnosis not present

## 2023-02-23 ENCOUNTER — Telehealth: Payer: Self-pay

## 2023-02-23 ENCOUNTER — Encounter (HOSPITAL_COMMUNITY): Admission: RE | Admit: 2023-02-23 | Payer: Medicare Other | Source: Ambulatory Visit

## 2023-02-23 NOTE — Telephone Encounter (Addendum)
Called patient regarding results. Patient had understanding of results. Patient advised PET scan to be schedule.----- Message from Cannon Kettle sent at 02/22/2023 10:57 AM EDT ----- Study unable to be completed secondary to frequent PACs/irregular heart rhythm. Will proceed with cardiac PET -patient agreeable.

## 2023-02-28 ENCOUNTER — Encounter (HOSPITAL_COMMUNITY): Payer: Medicare Other

## 2023-02-28 ENCOUNTER — Other Ambulatory Visit: Payer: Self-pay | Admitting: Family Medicine

## 2023-02-28 DIAGNOSIS — G527 Disorders of multiple cranial nerves: Secondary | ICD-10-CM | POA: Diagnosis not present

## 2023-02-28 DIAGNOSIS — J3800 Paralysis of vocal cords and larynx, unspecified: Secondary | ICD-10-CM | POA: Diagnosis not present

## 2023-03-01 DIAGNOSIS — R531 Weakness: Secondary | ICD-10-CM | POA: Diagnosis not present

## 2023-03-01 DIAGNOSIS — T17990A Other foreign object in respiratory tract, part unspecified in causing asphyxiation, initial encounter: Secondary | ICD-10-CM | POA: Diagnosis not present

## 2023-03-01 DIAGNOSIS — R1312 Dysphagia, oropharyngeal phase: Secondary | ICD-10-CM | POA: Diagnosis not present

## 2023-03-01 DIAGNOSIS — K219 Gastro-esophageal reflux disease without esophagitis: Secondary | ICD-10-CM | POA: Diagnosis not present

## 2023-03-01 DIAGNOSIS — R1310 Dysphagia, unspecified: Secondary | ICD-10-CM | POA: Diagnosis not present

## 2023-03-01 NOTE — Progress Notes (Signed)
Pulmonary Individual Treatment Plan  Patient Details  Name: [name] [name] MRN: [id number] Date of Birth: 19-Sep-1940 Referring Provider:   Doristine Devoid Pulmonary Rehab Walk Test from 12/28/2022 in [facility]  Referring Provider McNeil  [Ellison]       Initial Encounter Date:  Flowsheet Row Pulmonary Rehab Walk Test from 12/28/2022 in [facility], [facility], [facility] Lung [facility]  Date 12/28/22       Visit Diagnosis: Other disorders of lung  Patient's Home Medications on Admission:   Current Outpatient Medications:    albuterol (VENTOLIN HFA) 108 (90 Base) MCG/ACT inhaler, SMARTSIG:2 Inhalation Via Inhaler Every 6 Hours PRN, Disp: , Rfl:    amLODipine (NORVASC) 5 MG tablet, Take 1 tablet (5 mg total) by mouth daily., Disp: 90 tablet, Rfl: 3   aspirin 325 MG EC tablet, Take 325 mg by mouth every 6 (six) hours as needed for pain., Disp: , Rfl:    b complex vitamins tablet, Take 1 tablet by mouth daily., Disp: , Rfl:    budesonide-formoterol (SYMBICORT) 80-4.5 MCG/ACT inhaler, Inhale 2 puffs into the lungs 2 (two) times daily., Disp: 1 each, Rfl: 2   CALCIUM PO, Take 1,200 mg by mouth daily. , Disp: , Rfl:    denosumab (PROLIA) 60 MG/ML SOLN injection, Inject 60 mg into the skin every 6 (six) months. Administer in upper arm, thigh, or abdomen, Disp: , Rfl:    dextromethorphan-guaiFENesin (MUCINEX DM) 30-600 MG 12hr tablet, Take 1 tablet by mouth 2 (two) times daily as needed for cough. (Patient not taking: Reported on 02/08/2023), Disp: 12 tablet, Rfl: 3   fexofenadine (ALLEGRA) 60 MG tablet, Take 1 tablet (60 mg total) by mouth daily for 15 days., Disp: 15 tablet, Rfl: 0   fluticasone (FLONASE) 50 MCG/ACT nasal spray, Place 2 sprays into both nostrils daily., Disp: 18.2 g, Rfl: 0   furosemide (LASIX) 20 MG tablet, Take 1 tablet (20 mg total) by mouth daily as needed (leg swelling)., Disp: 30 tablet, Rfl:  3   levothyroxine (SYNTHROID) 100 MCG tablet, TAKE 1 TABLET(100 MCG) BY MOUTH DAILY, Disp: 90 tablet, Rfl: 2   metoprolol tartrate (LOPRESSOR) 100 MG tablet, Take 1 tablet (100 mg total) by mouth once for 1 dose. Take 2 hours prior to procedure., Disp: 1 tablet, Rfl: 0   pantoprazole (PROTONIX) 40 MG tablet, Take 1 tablet (40 mg total) by mouth in the morning. (Patient taking differently: Take 20 mg by mouth in the morning.), Disp: 30 tablet, Rfl: 0   Spacer/Aero-Holding Chambers (EASIVENT) inhaler, See admin instructions., Disp: , Rfl:   Past Medical History: Past Medical History:  Diagnosis Date   COVID 10/25/2020   Dog bite of thigh 04/09/2012   Elevated lipids    GERD (gastroesophageal reflux disease)    Hypertension    Hypothyroidism    post thyroidectomy   Osteoarthritis    Osteopenia    t -2.5 hip in 2005 by Dexa   Osteoporosis    commpr fx after dog related fall  injury   Seasonal rhinitis    spring and fall   Skin cancer    squamous cell ca chest   Vaginal prolapse    hs corrected    Tobacco Use: Social History   Tobacco Use  Smoking Status Never  Smokeless Tobacco Never    Labs: Review Flowsheet  More data exists      Latest Ref Rng & Units 05/11/2018 05/14/2019  09/23/2020 08/30/2021 07/20/2022  Labs for ITP Cardiac and Pulmonary Rehab  Cholestrol 0 - 200 mg/dL 161  096  045  409  811   LDL (calc) 0 - 99 mg/dL 914  782  956  213  086   HDL-C >39.00 mg/dL 57.84  69.62  95.28  41.32  68.10   Trlycerides 0.0 - 149.0 mg/dL 44.0  10.2  72.5  366.4  121.0   Hemoglobin A1c 4.6 - 6.5 % - - 5.8  - -    Details            Capillary Blood Glucose: No results found for: "GLUCAP"   Pulmonary Assessment Scores:  Pulmonary Assessment Scores     Row Name 12/28/22 1058         ADL UCSD   ADL Phase Entry     SOB Score total 11       CAT Score   CAT Score 7       mMRC Score   mMRC Score 3             UCSD: Self-administered rating of dyspnea  associated with activities of daily living (ADLs) 6-point scale (0 = "not at all" to 5 = "maximal or unable to do because of breathlessness")  Scoring Scores range from 0 to 120.  Minimally important difference is 5 units  CAT: CAT can identify the health impairment of COPD patients and is better correlated with disease progression.  CAT has a scoring range of zero to 40. The CAT score is classified into four groups of low (less than 10), medium (10 - 20), high (21-30) and very high (31-40) based on the impact level of disease on health status. A CAT score over 10 suggests significant symptoms.  A worsening CAT score could be explained by an exacerbation, poor medication adherence, poor inhaler technique, or progression of COPD or comorbid conditions.  CAT MCID is 2 points  mMRC: mMRC (Modified Medical Research Council) Dyspnea Scale is used to assess the degree of baseline functional disability in patients of respiratory disease due to dyspnea. No minimal important difference is established. A decrease in score of 1 point or greater is considered a positive change.   Pulmonary Function Assessment:  Pulmonary Function Assessment - 12/28/22 1132       Breath   Bilateral Breath Sounds --             Exercise Target Goals: Exercise Program Goal: Individual exercise prescription set using results from initial 6 min walk test and THRR while considering  patient's activity barriers and safety.   Exercise Prescription Goal: Initial exercise prescription builds to 30-45 minutes a day of aerobic activity, 2-3 days per week.  Home exercise guidelines will be given to patient during program as part of exercise prescription that the participant will acknowledge.  Activity Barriers & Risk Stratification:  Activity Barriers & Cardiac Risk Stratification - 12/28/22 1102       Activity Barriers & Cardiac Risk Stratification   Activity Barriers Deconditioning;Muscular Weakness;Shortness of  Breath;History of Falls;Arthritis;Neck/Spine Problems             6 Minute Walk:  6 Minute Walk     Row Name 12/28/22 1203         6 Minute Walk   Phase Initial     Distance 610 feet     Walk Time 6 minutes     # of Rest Breaks 4  4 rest breaks at: 1:46-2:07, 2:49-3:05,  3:49-4:32, 5:18-5:42     MPH 1.16     METS 1.63     RPE 13     Perceived Dyspnea  3     VO2 Peak 5.69     Symptoms Yes (comment)     Comments dyspnea     Resting HR 89 bpm     Resting BP 122/76     Resting Oxygen Saturation  98 %     Exercise Oxygen Saturation  during 6 min walk 86 %     Max Ex. HR 141 bpm     Max Ex. BP 140/82     2 Minute Post BP 122/78       Interval HR   1 Minute HR 114     2 Minute HR 116     3 Minute HR 129     4 Minute HR 141     5 Minute HR 135     6 Minute HR 130     2 Minute Post HR 103     Interval Heart Rate? Yes       Interval Oxygen   Interval Oxygen? Yes     Baseline Oxygen Saturation % 98 %     1 Minute Oxygen Saturation % 86 %  at 1:46 O2 dropped to 86%, PLB right up to 91%     1 Minute Liters of Oxygen 0 L     2 Minute Oxygen Saturation % 95 %     2 Minute Liters of Oxygen 0 L     3 Minute Oxygen Saturation % 86 %  2:49 O2 dropped to 86%, PLB up to 91%     3 Minute Liters of Oxygen 0 L     4 Minute Oxygen Saturation % 94 %     4 Minute Liters of Oxygen 0 L     5 Minute Oxygen Saturation % 92 %     5 Minute Liters of Oxygen 0 L     6 Minute Oxygen Saturation % 90 %     6 Minute Liters of Oxygen 0 L     2 Minute Post Oxygen Saturation % 94 %     2 Minute Post Liters of Oxygen 0 L              Oxygen Initial Assessment:  Oxygen Initial Assessment - 12/28/22 1058       Home Oxygen   Home Oxygen Device None    Sleep Oxygen Prescription None    Home Exercise Oxygen Prescription None    Home Resting Oxygen Prescription None      Initial 6 min Walk   Oxygen Used None      Program Oxygen Prescription   Program Oxygen Prescription None       Intervention   Short Term Goals To learn and understand importance of maintaining oxygen saturations>88%;To learn and demonstrate proper use of respiratory medications;To learn and understand importance of monitoring SPO2 with pulse oximeter and demonstrate accurate use of the pulse oximeter.;To learn and demonstrate proper pursed lip breathing techniques or other breathing techniques.     Long  Term Goals Verbalizes importance of monitoring SPO2 with pulse oximeter and return demonstration;Maintenance of O2 saturations>88%;Exhibits proper breathing techniques, such as pursed lip breathing or other method taught during program session;Compliance with respiratory medication             Oxygen Re-Evaluation:  Oxygen Re-Evaluation     Row Name 12/30/22 1028 01/20/23 1325 02/22/23  1610         Program Oxygen Prescription   Program Oxygen Prescription None None None       Home Oxygen   Home Oxygen Device None None None     Sleep Oxygen Prescription None None None     Home Exercise Oxygen Prescription None None None     Home Resting Oxygen Prescription None None None       Goals/Expected Outcomes   Short Term Goals To learn and understand importance of maintaining oxygen saturations>88%;To learn and demonstrate proper use of respiratory medications;To learn and understand importance of monitoring SPO2 with pulse oximeter and demonstrate accurate use of the pulse oximeter.;To learn and demonstrate proper pursed lip breathing techniques or other breathing techniques.  To learn and understand importance of maintaining oxygen saturations>88%;To learn and demonstrate proper use of respiratory medications;To learn and understand importance of monitoring SPO2 with pulse oximeter and demonstrate accurate use of the pulse oximeter.;To learn and demonstrate proper pursed lip breathing techniques or other breathing techniques.  To learn and understand importance of maintaining oxygen saturations>88%;To  learn and demonstrate proper use of respiratory medications;To learn and understand importance of monitoring SPO2 with pulse oximeter and demonstrate accurate use of the pulse oximeter.;To learn and demonstrate proper pursed lip breathing techniques or other breathing techniques.      Long  Term Goals Verbalizes importance of monitoring SPO2 with pulse oximeter and return demonstration;Maintenance of O2 saturations>88%;Exhibits proper breathing techniques, such as pursed lip breathing or other method taught during program session;Compliance with respiratory medication Verbalizes importance of monitoring SPO2 with pulse oximeter and return demonstration;Maintenance of O2 saturations>88%;Exhibits proper breathing techniques, such as pursed lip breathing or other method taught during program session;Compliance with respiratory medication Verbalizes importance of monitoring SPO2 with pulse oximeter and return demonstration;Maintenance of O2 saturations>88%;Exhibits proper breathing techniques, such as pursed lip breathing or other method taught during program session;Compliance with respiratory medication     Goals/Expected Outcomes Compliance and understanding of oxygen saturation monitoring and breathing techniques to decrease shortness of breath. Compliance and understanding of oxygen saturation monitoring and breathing techniques to decrease shortness of breath. Compliance and understanding of oxygen saturation monitoring and breathing techniques to decrease shortness of breath.              Oxygen Discharge (Final Oxygen Re-Evaluation):  Oxygen Re-Evaluation - 02/22/23 0938       Program Oxygen Prescription   Program Oxygen Prescription None      Home Oxygen   Home Oxygen Device None    Sleep Oxygen Prescription None    Home Exercise Oxygen Prescription None    Home Resting Oxygen Prescription None      Goals/Expected Outcomes   Short Term Goals To learn and understand importance of  maintaining oxygen saturations>88%;To learn and demonstrate proper use of respiratory medications;To learn and understand importance of monitoring SPO2 with pulse oximeter and demonstrate accurate use of the pulse oximeter.;To learn and demonstrate proper pursed lip breathing techniques or other breathing techniques.     Long  Term Goals Verbalizes importance of monitoring SPO2 with pulse oximeter and return demonstration;Maintenance of O2 saturations>88%;Exhibits proper breathing techniques, such as pursed lip breathing or other method taught during program session;Compliance with respiratory medication    Goals/Expected Outcomes Compliance and understanding of oxygen saturation monitoring and breathing techniques to decrease shortness of breath.             Initial Exercise Prescription:  Initial Exercise Prescription - 12/28/22 1200  Date of Initial Exercise RX and Referring Provider   Date 12/28/22    Referring Provider [name]   Expected Discharge Date 03/23/23      Recumbant Bike   Level 1    RPM 30    Minutes 15    METs 1.5      NuStep   Level 1    SPM 50    Minutes 15    METs 1.5      Prescription Details   Frequency (times per week) 2    Duration Progress to 30 minutes of continuous aerobic without signs/symptoms of physical distress      Intensity   THRR 40-80% of Max Heartrate 56-111    Ratings of Perceived Exertion 11-13    Perceived Dyspnea 0-4      Progression   Progression Continue to progress workloads to maintain intensity without signs/symptoms of physical distress.      Resistance Training   Training Prescription Yes    Weight red bands    Reps 10-15             Perform Capillary Blood Glucose checks as needed.  Exercise Prescription Changes:   Exercise Prescription Changes     Row Name 01/03/23 1200 01/17/23 1200 01/31/23 1200 02/14/23 1200 02/21/23 1212     Response to Exercise   Blood Pressure (Admit) 124/70 116/66  140/72 108/64 112/68   Blood Pressure (Exercise) 142/68 150/70 -- 158/78 --   Blood Pressure (Exit) 118/60 110/78 108/60 120/68 118/62   Heart Rate (Admit) 89 bpm 86 bpm 97 bpm 91 bpm 81 bpm   Heart Rate (Exercise) 101 bpm 103 bpm 111 bpm 106 bpm 96 bpm   Heart Rate (Exit) 93 bpm 85 bpm 97 bpm 96 bpm 85 bpm   Oxygen Saturation (Admit) 97 % 98 % 98 % 95 % 93 %   Oxygen Saturation (Exercise) 94 % 96 % 93 % 94 % 95 %   Oxygen Saturation (Exit) 96 % 97 % 97 % 96 % 97 %   Rating of Perceived Exertion (Exercise) 89 13 15 13 13    Perceived Dyspnea (Exercise) 101 0 1 1 1    Symptoms 93 -- -- -- --   Duration Progress to 30 minutes of  aerobic without signs/symptoms of physical distress Continue with 30 min of aerobic exercise without signs/symptoms of physical distress. Continue with 30 min of aerobic exercise without signs/symptoms of physical distress. Continue with 30 min of aerobic exercise without signs/symptoms of physical distress. Continue with 30 min of aerobic exercise without signs/symptoms of physical distress.   Intensity THRR unchanged THRR unchanged THRR unchanged THRR unchanged THRR unchanged     Progression   Progression Continue to progress workloads to maintain intensity without signs/symptoms of physical distress. -- Continue to progress workloads to maintain intensity without signs/symptoms of physical distress. Continue to progress workloads to maintain intensity without signs/symptoms of physical distress. Continue to progress workloads to maintain intensity without signs/symptoms of physical distress.   Average METs -- -- 2.4 -- --     Resistance Training   Training Prescription Yes Yes Yes Yes Yes   Weight red bands red bands red bands red bands red bands   Reps 10-15 10-15 10-15 10-15 10-15   Time 10 Minutes 10 Minutes 10 Minutes 10 Minutes 10 Minutes     Recumbant Bike   Level 1 3 4  -- 3   Minutes 15 15 15  -- 15   METs 1.7 2  2.4 -- --     NuStep   Level 1 2 3 3 3     SPM -- -- -- 74 --   Minutes 15 15 15 15 15    METs 1.6 1.8 1.9 1.9 1.8     Track   Laps -- -- -- 7 --   Minutes -- -- -- 15 --   METs -- -- -- 2.08 --            Exercise Comments:   Exercise Goals and Review:   Exercise Goals     Row Name 12/28/22 1103 01/20/23 1320 02/22/23 0924         Exercise Goals   Increase Physical Activity Yes Yes Yes     Intervention Provide advice, education, support and counseling about physical activity/exercise needs.;Develop an individualized exercise prescription for aerobic and resistive training based on initial evaluation findings, risk stratification, comorbidities and participant's personal goals. Provide advice, education, support and counseling about physical activity/exercise needs.;Develop an individualized exercise prescription for aerobic and resistive training based on initial evaluation findings, risk stratification, comorbidities and participant's personal goals. Provide advice, education, support and counseling about physical activity/exercise needs.;Develop an individualized exercise prescription for aerobic and resistive training based on initial evaluation findings, risk stratification, comorbidities and participant's personal goals.     Expected Outcomes Short Term: Attend rehab on a regular basis to increase amount of physical activity.;Long Term: Exercising regularly at least 3-5 days a week.;Long Term: Add in home exercise to make exercise part of routine and to increase amount of physical activity. Short Term: Attend rehab on a regular basis to increase amount of physical activity.;Long Term: Exercising regularly at least 3-5 days a week.;Long Term: Add in home exercise to make exercise part of routine and to increase amount of physical activity. Short Term: Attend rehab on a regular basis to increase amount of physical activity.;Long Term: Exercising regularly at least 3-5 days a week.;Long Term: Add in home exercise to make  exercise part of routine and to increase amount of physical activity.     Increase Strength and Stamina Yes Yes Yes     Intervention Provide advice, education, support and counseling about physical activity/exercise needs.;Develop an individualized exercise prescription for aerobic and resistive training based on initial evaluation findings, risk stratification, comorbidities and participant's personal goals. Provide advice, education, support and counseling about physical activity/exercise needs.;Develop an individualized exercise prescription for aerobic and resistive training based on initial evaluation findings, risk stratification, comorbidities and participant's personal goals. Provide advice, education, support and counseling about physical activity/exercise needs.;Develop an individualized exercise prescription for aerobic and resistive training based on initial evaluation findings, risk stratification, comorbidities and participant's personal goals.     Expected Outcomes Short Term: Increase workloads from initial exercise prescription for resistance, speed, and METs.;Short Term: Perform resistance training exercises routinely during rehab and add in resistance training at home;Long Term: Improve cardiorespiratory fitness, muscular endurance and strength as measured by increased METs and functional capacity ( ) Short Term: Increase workloads from initial exercise prescription for resistance, speed, and METs.;Short Term: Perform resistance training exercises routinely during rehab and add in resistance training at home;Long Term: Improve cardiorespiratory fitness, muscular endurance and strength as measured by increased METs and functional capacity ( ) Short Term: Increase workloads from initial exercise prescription for resistance, speed, and METs.;Short Term: Perform resistance training exercises routinely during rehab and add in resistance training at home;Long Term: Improve cardiorespiratory  fitness, muscular endurance and strength as measured by increased METs and functional  capacity ( )     Able to understand and use rate of perceived exertion (RPE) scale Yes Yes Yes     Intervention Provide education and explanation on how to use RPE scale Provide education and explanation on how to use RPE scale Provide education and explanation on how to use RPE scale     Expected Outcomes Short Term: Able to use RPE daily in rehab to express subjective intensity level;Long Term:  Able to use RPE to guide intensity level when exercising independently Short Term: Able to use RPE daily in rehab to express subjective intensity level;Long Term:  Able to use RPE to guide intensity level when exercising independently Short Term: Able to use RPE daily in rehab to express subjective intensity level;Long Term:  Able to use RPE to guide intensity level when exercising independently     Able to understand and use Dyspnea scale Yes Yes Yes     Intervention Provide education and explanation on how to use Dyspnea scale Provide education and explanation on how to use Dyspnea scale Provide education and explanation on how to use Dyspnea scale     Expected Outcomes Short Term: Able to use Dyspnea scale daily in rehab to express subjective sense of shortness of breath during exertion;Long Term: Able to use Dyspnea scale to guide intensity level when exercising independently Short Term: Able to use Dyspnea scale daily in rehab to express subjective sense of shortness of breath during exertion;Long Term: Able to use Dyspnea scale to guide intensity level when exercising independently Short Term: Able to use Dyspnea scale daily in rehab to express subjective sense of shortness of breath during exertion;Long Term: Able to use Dyspnea scale to guide intensity level when exercising independently     Knowledge and understanding of Target Heart Rate Range (THRR) Yes Yes Yes     Intervention Provide education and explanation of  THRR including how the numbers were predicted and where they are located for reference Provide education and explanation of THRR including how the numbers were predicted and where they are located for reference Provide education and explanation of THRR including how the numbers were predicted and where they are located for reference     Expected Outcomes Short Term: Able to state/look up THRR;Long Term: Able to use THRR to govern intensity when exercising independently;Short Term: Able to use daily as guideline for intensity in rehab Short Term: Able to state/look up THRR;Long Term: Able to use THRR to govern intensity when exercising independently;Short Term: Able to use daily as guideline for intensity in rehab Short Term: Able to state/look up THRR;Long Term: Able to use THRR to govern intensity when exercising independently;Short Term: Able to use daily as guideline for intensity in rehab     Understanding of Exercise Prescription Yes Yes Yes     Intervention Provide education, explanation, and written materials on patient's individual exercise prescription Provide education, explanation, and written materials on patient's individual exercise prescription Provide education, explanation, and written materials on patient's individual exercise prescription     Expected Outcomes Short Term: Able to explain program exercise prescription;Long Term: Able to explain home exercise prescription to exercise independently Short Term: Able to explain program exercise prescription;Long Term: Able to explain home exercise prescription to exercise independently Short Term: Able to explain program exercise prescription;Long Term: Able to explain home exercise prescription to exercise independently              Exercise Goals Re-Evaluation :  Exercise Goals Re-Evaluation  Row Name 12/30/22 1025 01/20/23 1320 02/22/23 0924         Exercise Goal Re-Evaluation   Exercise Goals Review Increase Physical  Activity;Able to understand and use Dyspnea scale;Understanding of Exercise Prescription;Increase Strength and Stamina;Knowledge and understanding of Target Heart Rate Range (THRR);Able to understand and use rate of perceived exertion (RPE) scale Increase Physical Activity;Able to understand and use Dyspnea scale;Understanding of Exercise Prescription;Increase Strength and Stamina;Knowledge and understanding of Target Heart Rate Range (THRR);Able to understand and use rate of perceived exertion (RPE) scale Increase Physical Activity;Able to understand and use Dyspnea scale;Understanding of Exercise Prescription;Increase Strength and Stamina;Knowledge and understanding of Target Heart Rate Range (THRR);Able to understand and use rate of perceived exertion (RPE) scale     Comments [name] [name] is scheduled to begin exercsise not week. Will continue to monitor and progress as able. [name] [name] has completed 6 exercise sessions. She exercises for 15 min on the recumbent bike and Nustep. [name] [name] averages 2.4 METs at level 3 on the recumbent bike and 1.8 METs at level 2 on the Nustep. [name] [name] performs the warmup and cooldown standing without limitations. She has increased her workload for both exercises modes as she tolerates progressions well. METs have increased for both exercise modes. [name] [name] is starting to understand her ExRx more. She seems motivated to exercise and increase her functional capacity. She would get short of breath when she started rehab. Her shortness of breath has improved with exercise. Will continue to monitor and progress as able. [name] [name] has completed 14 exercise sessions. She exercises for 15 min on the recumbent bike and Nustep. [name] [name] averages 2.3 METs at level 3 on the recumbent bike and 1.8 METs at level 3 on the Nustep. [name] [name] performs the warmup and cooldown standing without limitations. She has increased her workload for the Nustep as METs have remained the same. Staff have encouraged [name] [name] to walk the track  using a rollator. [name] [name] has walked the track a couple of sessions but not consistently. She prefers to use the recumbent bike over walking the track. Will continue to monitor and progress as able.     Expected Outcomes Through exercise at rehab and home, the patient will decrease shortness of breath with daily activities and feel confident in carrying out an exercise regimen at home. Through exercise at rehab and home, the patient will decrease shortness of breath with daily activities and feel confident in carrying out an exercise regimen at home. Through exercise at rehab and home, the patient will decrease shortness of breath with daily activities and feel confident in carrying out an exercise regimen at home.              Discharge Exercise Prescription (Final Exercise Prescription Changes):  Exercise Prescription Changes - 02/21/23 1212       Response to Exercise   Blood Pressure (Admit) 112/68    Blood Pressure (Exit) 118/62    Heart Rate (Admit) 81 bpm    Heart Rate (Exercise) 96 bpm    Heart Rate (Exit) 85 bpm    Oxygen Saturation (Admit) 93 %    Oxygen Saturation (Exercise) 95 %    Oxygen Saturation (Exit) 97 %    Rating of Perceived Exertion (Exercise) 13    Perceived Dyspnea (Exercise) 1    Duration Continue with 30 min of aerobic exercise without signs/symptoms of physical distress.    Intensity THRR unchanged      Progression   Progression Continue to progress workloads to maintain intensity without signs/symptoms of  physical distress.      Resistance Training   Training Prescription Yes    Weight red bands    Reps 10-15    Time 10 Minutes      Recumbant Bike   Level 3    Minutes 15      NuStep   Level 3    Minutes 15    METs 1.8             Nutrition:  Target Goals: Understanding of nutrition guidelines, daily intake of sodium 1500mg , cholesterol 200mg , calories 30% from fat and 7% or less from saturated fats, daily to have 5 or more servings of  fruits and vegetables.  Biometrics:  Pre Biometrics - 12/28/22 1209       Pre Biometrics   Grip Strength 12 kg              Nutrition Therapy Plan and Nutrition Goals:  Nutrition Therapy & Goals - 01/31/23 1521       Nutrition Therapy   Diet General Healthy Diet    Drug/Food Interactions Statins/Certain Fruits      Personal Nutrition Goals   Nutrition Goal Patient to identify strategies for weight maintenance/weight gain as needed throughout Pulmonary Rehab.   goal not met.   Comments Goal not met. [name] [name] reports history of unwanted weight loss over the last two year; per documentation, she was 150# July of 2023. Unwanted weight loss has been worked up through GI with normal findings. At this point, she does not want to gain weight but would like to maintain her current weight. She does not cook. She reports prioriziting protein foods such as eggs, protein bars, Ensure and then typically eats protein/vegetable based meal at dinner meal. [name] [name] is down 1.76# since starting with our program; reviewed strategies for weight gain/weight maintenance including nutrition supplements, increasing eating frequency, increasing calories from fat/carbohydrates/protein, etc. [name] [name] will benefit from from participation in pulmonary rehab for nutrition, exercise, and lifestyle modification.      Intervention Plan   Intervention Prescribe, educate and counsel regarding individualized specific dietary modifications aiming towards targeted core components such as weight, hypertension, lipid management, diabetes, heart failure and other comorbidities.;Nutrition handout(s) given to patient.    Expected Outcomes Short Term Goal: Understand basic principles of dietary content, such as calories, fat, sodium, cholesterol and nutrients.;Long Term Goal: Adherence to prescribed nutrition plan.             Nutrition Assessments:  Nutrition Assessments - 01/03/23 1111       Rate Your Plate Scores   Pre  Score 63            MEDIFICTS Score Key: ?70 Need to make dietary changes  40-70 Heart Healthy Diet ? 40 Therapeutic Level Cholesterol Diet  Flowsheet Row PULMONARY REHAB OTHER RESPIRATORY from 01/03/2023 in [facility]  Picture Your Plate Total Score on Admission 63      Picture Your Plate Scores: <82 Unhealthy dietary pattern with much room for improvement. 41-50 Dietary pattern unlikely to meet recommendations for good health and room for improvement. 51-60 More healthful dietary pattern, with some room for improvement.  >60 Healthy dietary pattern, although there may be some specific behaviors that could be improved.    Nutrition Goals Re-Evaluation:  Nutrition Goals Re-Evaluation     Row Name 01/03/23 1101 01/31/23 1521           Goals   Current Weight 120 lb  5.9 oz (54.6 kg) 119 lb 11.4 oz (54.3 kg)      Comment cholesterol 230, LDL 138 (cholesterol is treated with "low fat diet" per provider notes) no new labs; most recent labs  cholesterol 230, LDL 138 (cholesterol is treated with "low fat diet" per provider notes)      Expected Outcome [name] [name] reports history of unwanted weight loss over the last two year; per documentation, she was 150# July of 2023. Unwanted weight loss has been worked up through GI with normal findings.  At this point, she does not want to gain weight but would like to maintain her current weight. She does not cook. She reports prioriziting protein foods such as eggs, protein bars, Ensure and then typically eats protein, vegetable based meal at dinner meal. [name] [name] will benefit from from participation in pulmonary rehab for nutrition, exercise, and lifestyle modification. Goal not met. [name] [name] reports history of unwanted weight loss over the last two year; per documentation, she was 150# July of 2023. Unwanted weight loss has been worked up through GI with normal findings. At this point, she does not  want to gain weight but would like to maintain her current weight. She does not cook. She reports prioriziting protein foods such as eggs, protein bars, Ensure and then typically eats protein/vegetable based meal at dinner meal. [name] [name] is down 1.76# since starting with our program; reviewed strategies for weight gain/weight maintenance including nutrition supplements, increasing eating frequency, increasing calories from fat/carbohydrates/protein, etc. [name] [name] will benefit from from participation in pulmonary rehab for nutrition, exercise, and lifestyle modification               Nutrition Goals Discharge (Final Nutrition Goals Re-Evaluation):  Nutrition Goals Re-Evaluation - 01/31/23 1521       Goals   Current Weight 119 lb 11.4 oz (54.3 kg)    Comment no new labs; most recent labs  cholesterol 230, LDL 138 (cholesterol is treated with "low fat diet" per provider notes)    Expected Outcome Goal not met. [name] [name] reports history of unwanted weight loss over the last two year; per documentation, she was 150# July of 2023. Unwanted weight loss has been worked up through GI with normal findings. At this point, she does not want to gain weight but would like to maintain her current weight. She does not cook. She reports prioriziting protein foods such as eggs, protein bars, Ensure and then typically eats protein/vegetable based meal at dinner meal. [name] [name] is down 1.76# since starting with our program; reviewed strategies for weight gain/weight maintenance including nutrition supplements, increasing eating frequency, increasing calories from fat/carbohydrates/protein, etc. [name] [name] will benefit from from participation in pulmonary rehab for nutrition, exercise, and lifestyle modification             Psychosocial: Target Goals: Acknowledge presence or absence of significant depression and/or stress, maximize coping skills, provide positive support system. Participant is able to verbalize types and ability to  use techniques and skills needed for reducing stress and depression.  Initial Review & Psychosocial Screening:  Initial Psych Review & Screening - 12/28/22 1054       Initial Review   Current issues with None Identified      Family Dynamics   Good Support System? Yes    Comments son      Barriers   Psychosocial barriers to participate in program There are no identifiable barriers or psychosocial needs.             Quality of Life  Scores:  Scores of 19 and below usually indicate a poorer quality of life in these areas.  A difference of  2-3 points is a clinically meaningful difference.  A difference of 2-3 points in the total score of the Quality of Life Index has been associated with significant improvement in overall quality of life, self-image, physical symptoms, and general health in studies assessing change in quality of life.  PHQ-9: Review Flowsheet  More data exists      12/28/2022 11/28/2022 07/20/2022 07/14/2022 12/29/2021  Depression screen PHQ 2/9  Decreased Interest 0 0 0 0 0  Down, Depressed, Hopeless 0 0 0 0 0  PHQ - 2 Score 0 0 0 0 0  Altered sleeping 1 - 0 0 0  Tired, decreased energy 1 - 2 1 1   Change in appetite 1 - 1 0 0  Feeling bad or failure about yourself  0 - 0 0 0  Trouble concentrating 0 - 0 0 0  Moving slowly or fidgety/restless 0 - 0 0 0  Suicidal thoughts 0 - 0 0 0  PHQ-9 Score 3 - 3 1 1   Difficult doing work/chores Not difficult at all - Not difficult at all Somewhat difficult Not difficult at all    Details           Interpretation of Total Score  Total Score Depression Severity:  1-4 = Minimal depression, 5-9 = Mild depression, 10-14 = Moderate depression, 15-19 = Moderately severe depression, 20-27 = Severe depression   Psychosocial Evaluation and Intervention:  Psychosocial Evaluation - 12/28/22 1055       Psychosocial Evaluation & Interventions   Interventions Encouraged to exercise with the program and follow exercise  prescription    Comments [name] [name] denies any psychosocal barriers to exercise.    Expected Outcomes For [name] [name] to participate in PR free of psychosocial barriers.    Continue Psychosocial Services  No Follow up required             Psychosocial Re-Evaluation:  Psychosocial Re-Evaluation     Row Name 01/02/23 [phone] 01/27/23 1044 02/22/23 0925         Psychosocial Re-Evaluation   Current issues with None Identified None Identified None Identified     Comments No new psychosocial barriers or concerns since orientation on 7/3 [name] [name] denies any psychosocial barriers or concerns at this time. [name] [name] denies any psychosocial barriers or concerns at this time.     Expected Outcomes For [name] [name] to participate in PR free of any psychosocial barriers or concerns For [name] [name] to participate in PR free of any psychosocial barriers or concerns For [name] [name] to participate in PR free of any psychosocial barriers or concerns     Interventions Encouraged to attend Pulmonary Rehabilitation for the exercise Encouraged to attend Pulmonary Rehabilitation for the exercise Encouraged to attend Pulmonary Rehabilitation for the exercise     Continue Psychosocial Services  No Follow up required No Follow up required No Follow up required              Psychosocial Discharge (Final Psychosocial Re-Evaluation):  Psychosocial Re-Evaluation - 02/22/23 0925       Psychosocial Re-Evaluation   Current issues with None Identified    Comments [name] [name] denies any psychosocial barriers or concerns at this time.    Expected Outcomes For [name] [name] to participate in PR free of any psychosocial barriers or concerns    Interventions Encouraged to attend Pulmonary Rehabilitation for the exercise    Continue Psychosocial Services  No  Follow up required             Education: Education Goals: Education classes will be provided on a weekly basis, covering required topics. Participant will state understanding/return demonstration of  topics presented.  Learning Barriers/Preferences:  Learning Barriers/Preferences - 12/28/22 1055       Learning Barriers/Preferences   Learning Barriers Sight    Learning Preferences Computer/Internet;Written Material;Pictoral             Education Topics: Introduction to Pulmonary Rehab Group instruction provided by PowerPoint, verbal discussion, and written material to support subject matter. Instructor reviews what Pulmonary Rehab is, the purpose of the program, and how patients are referred.     Know Your Numbers Group instruction that is supported by a PowerPoint presentation. Instructor discusses importance of knowing and understanding resting, exercise, and post-exercise oxygen saturation, heart rate, and blood pressure. Oxygen saturation, heart rate, blood pressure, rating of perceived exertion, and dyspnea are reviewed along with a normal range for these values.  Flowsheet Row PULMONARY REHAB OTHER RESPIRATORY from 01/05/2023 in [facility]  Date 01/05/23  Educator EP  Instruction Review Code 1- Verbalizes Understanding       Exercise for the Pulmonary Patient Group instruction that is supported by a PowerPoint presentation. Instructor discusses benefits of exercise, core components of exercise, frequency, duration, and intensity of an exercise routine, importance of utilizing pulse oximetry during exercise, safety while exercising, and options of places to exercise outside of rehab.       MET Level  Group instruction provided by PowerPoint, verbal discussion, and written material to support subject matter. Instructor reviews what METs are and how to increase METs.    Pulmonary Medications Verbally interactive group education provided by instructor with focus on inhaled medications and proper administration.   Anatomy and Physiology of the Respiratory System Group instruction provided by PowerPoint,  verbal discussion, and written material to support subject matter. Instructor reviews respiratory cycle and anatomical components of the respiratory system and their functions. Instructor also reviews differences in obstructive and restrictive respiratory diseases with examples of each.    Oxygen Safety Group instruction provided by PowerPoint, verbal discussion, and written material to support subject matter. There is an overview of "What is Oxygen" and "Why do we need it".  Instructor also reviews how to create a safe environment for oxygen use, the importance of using oxygen as prescribed, and the risks of noncompliance. There is a brief discussion on traveling with oxygen and resources the patient may utilize. Flowsheet Row PULMONARY REHAB OTHER RESPIRATORY from 01/12/2023 in [facility] for Heart, Vascular, & Lung Health  Date 01/12/23  Educator RN  Instruction Review Code 1- Verbalizes Understanding       Oxygen Use Group instruction provided by PowerPoint, verbal discussion, and written material to discuss how supplemental oxygen is prescribed and different types of oxygen supply systems. Resources for more information are provided.  Flowsheet Row PULMONARY REHAB OTHER RESPIRATORY from 01/19/2023 in [facility] for Heart, Vascular, & Lung Health  Date 01/19/23  Educator RT  Instruction Review Code 1- Verbalizes Understanding       Breathing Techniques Group instruction that is supported by demonstration and informational handouts. Instructor discusses the benefits of pursed lip and diaphragmatic breathing and detailed demonstration on how to perform both.  Flowsheet Row PULMONARY REHAB OTHER RESPIRATORY from 01/26/2023 in [facility] for Heart, Vascular, &  Lung Health  Date 01/26/23  Educator RN  Instruction Review Code 1- Verbalizes Understanding        Risk Factor Reduction Group instruction that is  supported by a PowerPoint presentation. Instructor discusses the definition of a risk factor, different risk factors for pulmonary disease, and how the heart and lungs work together. Flowsheet Row PULMONARY REHAB OTHER RESPIRATORY from 02/16/2023 in [facility] for Heart, Vascular, & Lung Health  Date 02/16/23  Educator EP  Instruction Review Code 1- Verbalizes Understanding       MD Day A group question and answer session with a medical doctor that allows participants to ask questions that relate to their pulmonary disease state.   Nutrition for the Pulmonary Patient Group instruction provided by PowerPoint slides, verbal discussion, and written materials to support subject matter. The instructor gives an explanation and review of healthy diet recommendations, which includes a discussion on weight management, recommendations for fruit and vegetable consumption, as well as protein, fluid, caffeine, fiber, sodium, sugar, and alcohol. Tips for eating when patients are short of breath are discussed.    Other Education Group or individual verbal, written, or video instructions that support the educational goals of the pulmonary rehab program. Flowsheet Row PULMONARY REHAB OTHER RESPIRATORY from 02/09/2023 in [facility] for [facility], Vascular, & Lung Health  Date 02/09/23  Educator RN  Instruction Review Code 1- Verbalizes Understanding        Knowledge Questionnaire Score:   Core Components/Risk Factors/Patient Goals at Admission:  Personal Goals and Risk Factors at Admission - 12/28/22 1056       Core Components/Risk Factors/Patient Goals on Admission    Weight Management Weight Gain;Yes    Intervention Weight Management: Develop a combined nutrition and exercise program designed to reach desired caloric intake, while maintaining appropriate intake of nutrient and fiber, sodium and fats, and appropriate energy expenditure required for the  weight goal.;Weight Management: Provide education and appropriate resources to help participant work on and attain dietary goals.    Expected Outcomes Short Term: Continue to assess and modify interventions until short term weight is achieved;Long Term: Adherence to nutrition and physical activity/exercise program aimed toward attainment of established weight goal;Weight Maintenance: Understanding of the daily nutrition guidelines, which includes 25-35% calories from fat, 7% or less cal from saturated fats, less than 200mg  cholesterol, less than 1.5gm of sodium, & 5 or more servings of fruits and vegetables daily;Weight Gain: Understanding of general recommendations for a high calorie, high protein meal plan that promotes weight gain by distributing calorie intake throughout the day with the consumption for 4-5 meals, snacks, and/or supplements;Understanding of distribution of calorie intake throughout the day with the consumption of 4-5 meals/snacks;Understanding recommendations for meals to include 15-35% energy as protein, 25-35% energy from fat, 35-60% energy from carbohydrates, less than 200mg  of dietary cholesterol, 20-35 gm of total fiber daily    Improve shortness of breath with ADL's Yes    Intervention Provide education, individualized exercise plan and daily activity instruction to help decrease symptoms of SOB with activities of daily living.    Expected Outcomes Short Term: Improve cardiorespiratory fitness to achieve a reduction of symptoms when performing ADLs;Long Term: Be able to perform more ADLs without symptoms or delay the onset of symptoms             Core Components/Risk Factors/Patient Goals Review:   Goals and Risk Factor Review     Row Name 01/02/23 [phone] 01/27/23 1045 02/22/23  1610         Core Components/Risk Factors/Patient Goals Review   Personal Goals Review Weight Management/Obesity;Improve shortness of breath with ADL's;Develop more efficient breathing techniques  such as purse lipped breathing and diaphragmatic breathing and practicing self-pacing with activity. Weight Management/Obesity;Improve shortness of breath with ADL's;Develop more efficient breathing techniques such as purse lipped breathing and diaphragmatic breathing and practicing self-pacing with activity. Weight Management/Obesity;Improve shortness of breath with ADL's     Review [name] [name] is scheduled to start the program on 7/9. We will monitor her progress throughout the program. Goal progressing for weight gain. [name] [name] is working with staff dietician to achieve weight gain goals. Goal progressing for improving shortness of breath with ADL's. Goal met on developing more efficient breathing techniques such as purse lipped breathing and diaphragmatic breathing; and practicing self-pacing with activity. We will continue to monitor [name] [name]'s progress throughout the program. [name] [name] had initially wanted to gain weight prior to starting the program, but based on discussion with the staff dietician she just wants to maintain her weight at this time. Goal progressing on maintaining weight. Goal progressing for improving shortness of breath with ADL's. We will continue to monitor [name] [name]'s progress throughout the program.     Expected Outcomes See admission goals See admission goals See admission goals              Core Components/Risk Factors/Patient Goals at Discharge (Final Review):   Goals and Risk Factor Review - 02/22/23 0935       Core Components/Risk Factors/Patient Goals Review   Personal Goals Review Weight Management/Obesity;Improve shortness of breath with ADL's    Review [name] [name] had initially wanted to gain weight prior to starting the program, but based on discussion with the staff dietician she just wants to maintain her weight at this time. Goal progressing on maintaining weight. Goal progressing for improving shortness of breath with ADL's. We will continue to monitor [name] [name]'s progress throughout  the program.    Expected Outcomes See admission goals             ITP Comments:Pt is making expected progress toward Pulmonary Rehab goals after completing 14 sessions. Recommend continued exercise, life style modification, education, and utilization of breathing techniques to increase stamina and strength, while also decreasing shortness of breath with exertion.  Dr. Mechele Collin is Medical Director for Pulmonary Rehab at [facility].     Comments: Dr. Mechele Collin is Medical Director for Pulmonary Rehab at [facility].

## 2023-03-02 ENCOUNTER — Other Ambulatory Visit: Payer: Self-pay | Admitting: Family Medicine

## 2023-03-02 ENCOUNTER — Encounter (HOSPITAL_COMMUNITY)
Admission: RE | Admit: 2023-03-02 | Discharge: 2023-03-02 | Disposition: A | Discharge status: Home / Self Care | Payer: Medicare Other | Source: Ambulatory Visit | Attending: Pulmonary Disease | Admitting: Pulmonary Disease

## 2023-03-02 ENCOUNTER — Other Ambulatory Visit: Payer: Self-pay | Admitting: Family

## 2023-03-02 DIAGNOSIS — J984 Other disorders of lung: Secondary | ICD-10-CM | POA: Diagnosis not present

## 2023-03-02 NOTE — Progress Notes (Signed)
Daily Session Note  Patient Details  Name: [name] [name] MRN: [id number] Date of Birth: Sep 11, 1940 Referring Provider:   Doristine Devoid Pulmonary Rehab Walk Test from 12/28/2022 in [facility] for Heart, Vascular, & Lung Health  Referring Provider [name]       Encounter Date: 03/02/2023  Check In:  Session Check In - 03/02/23 1136       Check-In   Supervising physician immediately available to respond to emergencies [facility] MD immediately available    Physician(s) Alveria Apley, NP    [facility]    Staff Present Raford Pitcher, MS, ACSM-CEP, Exercise Physiologist;Samantha Belarus, RD, Dutch Gray, RN, BSN;Casey Smith, RT;Ab Leaming BS, ACSM-CEP, Exercise Physiologist    Virtual Visit No    Medication changes reported     No    Fall or balance concerns reported    No    Tobacco Cessation No Change    Warm-up and Cool-down Performed as group-led instruction    Resistance Training Performed Yes    VAD Patient? No    PAD/SET Patient? No      Pain Assessment   Currently in Pain? No/denies    Pain Score 0-No pain    Multiple Pain Sites No             Capillary Blood Glucose: No results found for this or any previous visit (from the past 24 hour(s)).    Social History   Tobacco Use  Smoking Status Never  Smokeless Tobacco Never    Goals Met:  Independence with exercise equipment Exercise tolerated well No report of concerns or symptoms today Strength training completed today  Goals Unmet:  Not Applicable  Comments: Service time is from 1021 to 1158.    Dr. Mechele Collin is Medical Director for Pulmonary Rehab at [facility].

## 2023-03-03 DIAGNOSIS — I10 Essential (primary) hypertension: Secondary | ICD-10-CM | POA: Diagnosis not present

## 2023-03-03 DIAGNOSIS — R0602 Shortness of breath: Secondary | ICD-10-CM | POA: Diagnosis not present

## 2023-03-03 DIAGNOSIS — R531 Weakness: Secondary | ICD-10-CM | POA: Diagnosis not present

## 2023-03-07 ENCOUNTER — Encounter (HOSPITAL_COMMUNITY)
Admission: RE | Admit: 2023-03-07 | Discharge: 2023-03-07 | Disposition: A | Discharge status: Home / Self Care | Payer: Medicare Other | Source: Ambulatory Visit | Attending: Pulmonary Disease | Admitting: Pulmonary Disease

## 2023-03-07 VITALS — Wt 113.3 lb

## 2023-03-07 DIAGNOSIS — J984 Other disorders of lung: Secondary | ICD-10-CM | POA: Diagnosis not present

## 2023-03-07 NOTE — Progress Notes (Signed)
Daily Session Note  Patient Details  Name: [name] [name] MRN: [id number] Date of Birth: Aug 26, 1940 Referring Provider:   Doristine Devoid Pulmonary Rehab Walk Test from 12/28/2022 in [facility] for Heart, Vascular, & Lung Health  Referring Provider [name]       Encounter Date: 03/07/2023  Check In:  Session Check In - 03/07/23 1041       Check-In   Supervising physician immediately available to respond to emergencies [facility] immediately available    Physician(s) Alveria Apley, NP    [facility]    Staff Present Raford Pitcher, MS, ACSM-CEP, Exercise Physiologist;Samantha Belarus, RD, Dutch Gray, RN, BSN;Casey Smith, RT;Randi Reeve BS, ACSM-CEP, Exercise Physiologist    Virtual Visit No    Medication changes reported     No    Fall or balance concerns reported    No    Tobacco Cessation No Change    Warm-up and Cool-down Performed as group-led instruction    Resistance Training Performed Yes    VAD Patient? No    PAD/SET Patient? No      Pain Assessment   Currently in Pain? No/denies    Multiple Pain Sites No             Capillary Blood Glucose: No results found for this or any previous visit (from the past 24 hour(s)).    Social History   Tobacco Use  Smoking Status Never  Smokeless Tobacco Never    Goals Met:  Proper associated with RPD/PD & O2 Sat Exercise tolerated well No report of concerns or symptoms today Strength training completed today  Goals Unmet:  Not Applicable  Comments: Service time is from 1024 to 1143.    Dr. Mechele Collin is Medical Director for Pulmonary Rehab at [facility].

## 2023-03-09 ENCOUNTER — Encounter (HOSPITAL_COMMUNITY)
Admission: RE | Admit: 2023-03-09 | Discharge: 2023-03-09 | Disposition: A | Discharge status: Home / Self Care | Payer: Medicare Other | Source: Ambulatory Visit | Attending: Pulmonary Disease | Admitting: Pulmonary Disease

## 2023-03-09 DIAGNOSIS — J984 Other disorders of lung: Secondary | ICD-10-CM

## 2023-03-09 NOTE — Progress Notes (Signed)
Daily Session Note  Patient Details  Name: [name] [name] MRN: [id number] Date of Birth: 07/15/1940 Referring Provider:   Doristine Devoid Pulmonary Rehab Walk Test from 12/28/2022 in [facility] for Heart, Vascular, & Lung Health  Referring Provider [name]       Encounter Date: 03/09/2023  Check In:  Session Check In - 03/09/23 1122       Check-In   Supervising physician immediately available to respond to emergencies [facility] immediately available    Physician(s) Bernadene Person, NP    [facility]    Staff Present Raford Pitcher, MS, ACSM-CEP, Exercise Physiologist;Samantha Belarus, RD, Dutch Gray, RN, BSN;Casey Smith, RT;Randi Reeve BS, ACSM-CEP, Exercise Physiologist    Virtual Visit No    Medication changes reported     No    Fall or balance concerns reported    No    Tobacco Cessation No Change    Warm-up and Cool-down Performed as group-led instruction    Resistance Training Performed Yes    VAD Patient? No    PAD/SET Patient? No      Pain Assessment   Currently in Pain? No/denies    Pain Score 0-No pain    Multiple Pain Sites No             Capillary Blood Glucose: No results found for this or any previous visit (from the past 24 hour(s)).    Social History   Tobacco Use  Smoking Status Never  Smokeless Tobacco Never    Goals Met:  Independence with exercise equipment Exercise tolerated well No report of concerns or symptoms today Strength training completed today  Goals Unmet:  Not Applicable  Comments: Service time is from 1018 to 1152    Dr. Mechele Collin is Medical Director for Pulmonary Rehab at [facility].

## 2023-03-09 NOTE — Progress Notes (Signed)
Home Exercise Prescription I have reviewed a Home Exercise Prescription with [name] [name]. She cycles on a stationary bike 2 non-rehab days/wk for 30 min/day. [name] goes to her son's gym and exercises with him. He is very motivated to have [name] exercise. I believe [name]'s son will keep her exercising even after rehab. I discussed increasing her frequency after rehab. She agreed with my recommendation. [name] is motivated to exercise and improve her functional capacity. The patient stated that their goals were to maintain her current health. We reviewed exercise guidelines, target heart rate during exercise, RPE Scale, weather conditions, endpoints for exercise, warmup and cool down. The patient is encouraged to come to me with any questions. I will continue to follow up with the patient to assist them with progression and safety. Spent 15 min with patient discussing home exercise plan and goals  Joya San, MS, ACSM-CEP 03/09/2023 3:26 PM

## 2023-03-14 ENCOUNTER — Ambulatory Visit: Payer: Medicare Other | Admitting: Obstetrics & Gynecology

## 2023-03-14 ENCOUNTER — Encounter (HOSPITAL_COMMUNITY)
Admission: RE | Admit: 2023-03-14 | Discharge: 2023-03-14 | Disposition: A | Discharge status: Home / Self Care | Payer: Medicare Other | Source: Ambulatory Visit | Attending: Pulmonary Disease

## 2023-03-14 VITALS — Wt 113.3 lb

## 2023-03-14 DIAGNOSIS — J984 Other disorders of lung: Secondary | ICD-10-CM | POA: Diagnosis not present

## 2023-03-14 NOTE — Progress Notes (Deleted)
Daily Session Note  Patient Details  Name: [name] [name] MRN: [id number] Date of Birth: May 22, 1941 Referring Provider:   Doristine Devoid Pulmonary Rehab Walk Test from 12/28/2022 in [facility] for Heart, Vascular, & Lung Health  Referring Provider Uvaldo Rising  [[place]]       Encounter Date: 03/07/2023  Check In:   Capillary Blood Glucose: No results found for this or any previous visit (from the past 24 hour(s)).   Exercise Prescription Changes - 03/14/23 1200       Response to Exercise   Blood Pressure (Admit) 124/78    Blood Pressure (Exercise) 180/82    Blood Pressure (Exit) 130/80    Heart Rate (Admit) 88 bpm    Heart Rate (Exercise) 101 bpm    Heart Rate (Exit) 85 bpm    Oxygen Saturation (Admit) 96 %    Oxygen Saturation (Exercise) 92 %    Oxygen Saturation (Exit) 94 %    Rating of Perceived Exertion (Exercise) 12    Perceived Dyspnea (Exercise) 1    Duration Continue with 30 min of aerobic exercise without signs/symptoms of physical distress.    Intensity THRR unchanged      Progression   Progression Continue to progress workloads to maintain intensity without signs/symptoms of physical distress.    Average METs 2.5      Resistance Training   Training Prescription Yes    Weight red bands    Reps 10-15    Time 10 Minutes      Recumbant Bike   Level 3    Minutes 15    METs 2.5      NuStep   Level 3    Minutes 15    METs 1.7             Social History   Tobacco Use  Smoking Status Never  Smokeless Tobacco Never    Goals Met:  Independence with exercise equipment Exercise tolerated well No report of concerns or symptoms today Strength training completed today  Goals Unmet:  Not Applicable  Comments: Service time is from 1012 to 1135.    Dr. Mechele Collin is Medical Director for Pulmonary Rehab at [facility].

## 2023-03-14 NOTE — Progress Notes (Signed)
Daily Session Note  Patient Details  Name: [name] [name] MRN: [id number] Date of Birth: 05-31-1941 Referring Provider:   Doristine Devoid Pulmonary Rehab Walk Test from 12/28/2022 in [facility] for Heart, Vascular, & Lung Health  Referring Provider [name]       Encounter Date: 03/14/2023  Check In:  Session Check In - 03/14/23 1021       Check-In   Supervising physician immediately available to respond to emergencies [facility] immediately available    Physician(s) Jari Favre, NP    [facility]    Staff Present Raford Pitcher, MS, ACSM-CEP, Exercise Physiologist;Samantha Belarus, RD, Dutch Gray, RN, BSN;Casey Smith, RT;Zalea Pete BS, ACSM-CEP, Exercise Physiologist;David Taft, MS, ACSM-CEP, CCRP, Exercise Physiologist    Virtual Visit No    Medication changes reported     No    Fall or balance concerns reported    No    Tobacco Cessation No Change    Warm-up and Cool-down Performed as group-led instruction    Resistance Training Performed Yes    VAD Patient? No    PAD/SET Patient? No      Pain Assessment   Currently in Pain? No/denies    Multiple Pain Sites No             Capillary Blood Glucose: No results found for this or any previous visit (from the past 24 hour(s)).   Exercise Prescription Changes - 03/14/23 1208       Response to Exercise   Blood Pressure (Admit) 124/78    Blood Pressure (Exercise) 180/82    Blood Pressure (Exit) 130/80    Heart Rate (Admit) 88 bpm    Heart Rate (Exercise) 101 bpm    Heart Rate (Exit) 85 bpm    Oxygen Saturation (Admit) 96 %    Oxygen Saturation (Exercise) 92 %    Oxygen Saturation (Exit) 94 %    Rating of Perceived Exertion (Exercise) 12    Perceived Dyspnea (Exercise) 1    Duration Continue with 30 min of aerobic exercise without signs/symptoms of physical distress.    Intensity THRR unchanged      Progression   Progression Continue to progress  workloads to maintain intensity without signs/symptoms of physical distress.    Average METs 2.5      Resistance Training   Training Prescription Yes    Weight red bands    Reps 10-15    Time 10 Minutes      Recumbant Bike   Level 3    Minutes 15    METs 2.5      NuStep   Level 3    Minutes 15    METs 1.7             Social History   Tobacco Use  Smoking Status Never  Smokeless Tobacco Never    Goals Met:  Independence with exercise equipment Exercise tolerated well No report of concerns or symptoms today Strength training completed today  Goals Unmet:  Not Applicable  Comments: Service time is from 1012 to 1135.    Dr. Mechele Collin is Medical Director for Pulmonary Rehab at [facility].

## 2023-03-16 ENCOUNTER — Encounter (HOSPITAL_COMMUNITY)
Admission: RE | Admit: 2023-03-16 | Discharge: 2023-03-16 | Disposition: A | Discharge status: Home / Self Care | Payer: Medicare Other | Source: Ambulatory Visit | Attending: Pulmonary Disease | Admitting: Pulmonary Disease

## 2023-03-16 DIAGNOSIS — J984 Other disorders of lung: Secondary | ICD-10-CM | POA: Diagnosis not present

## 2023-03-16 NOTE — Progress Notes (Signed)
Daily Session Note  Patient Details  Name: [name] [name] MRN: [id number] Date of Birth: [birth year] Referring Provider:   Doristine Devoid Pulmonary Rehab Walk Test from 12/28/2022 in [facility] for Heart, Vascular, & Lung Health  Referring Provider [name]       Encounter Date: 03/16/2023  Check In:  Session Check In - 03/16/23 1120       Check-In   Supervising physician immediately available to respond to emergencies [facility] immediately available    Physician(s) Jari Favre, NP    [facility]    Staff Present Raford Pitcher, MS, ACSM-CEP, Exercise Physiologist;Mary Gerre Scull, RN, BSN;Casey Katrinka Blazing, RT;Randi Reeve BS, ACSM-CEP, Exercise Physiologist    Virtual Visit No    Medication changes reported     No    Fall or balance concerns reported    No    Tobacco Cessation No Change    Warm-up and Cool-down Performed as group-led instruction    Resistance Training Performed Yes    VAD Patient? No    PAD/SET Patient? No      Pain Assessment   Currently in Pain? No/denies    Multiple Pain Sites No             Capillary Blood Glucose: No results found for this or any previous visit (from the past 24 hour(s)).    Social History   Tobacco Use  Smoking Status Never  Smokeless Tobacco Never    Goals Met:  Proper associated with RPD/PD & O2 Sat Independence with exercise equipment Exercise tolerated well No report of concerns or symptoms today Strength training completed today  Goals Unmet:  Not Applicable  Comments: Service time is from 1028 to 1159.    Dr. Mechele Collin is Medical Director for Pulmonary Rehab at [facility].

## 2023-03-17 DIAGNOSIS — M545 Low back pain, unspecified: Secondary | ICD-10-CM | POA: Diagnosis not present

## 2023-03-17 DIAGNOSIS — M533 Sacrococcygeal disorders, not elsewhere classified: Secondary | ICD-10-CM | POA: Diagnosis not present

## 2023-03-17 DIAGNOSIS — Z7409 Other reduced mobility: Secondary | ICD-10-CM | POA: Diagnosis not present

## 2023-03-17 DIAGNOSIS — G8929 Other chronic pain: Secondary | ICD-10-CM | POA: Diagnosis not present

## 2023-03-17 DIAGNOSIS — R293 Abnormal posture: Secondary | ICD-10-CM | POA: Diagnosis not present

## 2023-03-20 ENCOUNTER — Telehealth: Payer: Self-pay

## 2023-03-20 DIAGNOSIS — R0609 Other forms of dyspnea: Secondary | ICD-10-CM

## 2023-03-20 NOTE — Telephone Encounter (Addendum)
Called patient to advise Cardiac pet ordered. Scheduling will call to set up appointment.----- Message from Cannon Kettle sent at 02/22/2023 10:56 AM EDT ----- Regarding: RE: Order Cardiac PET Rob Bunting,   I spoke with Ms. Blomgren just now.  Her and her son now feel comfortable proceeding with cardiac PET.  Please place order for cardiac PET, diagnosis: Dyspnea on exertion, precordial pain, decreased exercise tolerance.    Thank you! Britt Boozer ----- Message ----- From: Donneta Romberg, CMA Sent: 02/21/2023   2:28 PM EDT To: Cannon Kettle, PA-C Subject: RE: Order Cardiac PET                          Graylin Shiver,   I called Ms. Johnson. If you could giver a call she would like more information regarding PET ----- Message ----- From: Cannon Kettle, PA-C Sent: 02/21/2023  12:41 PM EDT To: Lennie Odor, RN; Donneta Romberg, CMA Subject: Order Cardiac PET                              Rob Bunting,   Ms. Lusardi was unable to complete her CTA coronaries secondary to frequent PACs.  Please call patient and place order for cardiac PET, diagnosis: Dyspnea on exertion, precordial pain, decreased exercise tolerance.    Thank you! Britt Boozer

## 2023-03-21 ENCOUNTER — Encounter (HOSPITAL_COMMUNITY)
Admission: RE | Admit: 2023-03-21 | Discharge: 2023-03-21 | Disposition: A | Discharge status: Home / Self Care | Payer: Medicare Other | Source: Ambulatory Visit | Attending: Pulmonary Disease | Admitting: Pulmonary Disease

## 2023-03-21 DIAGNOSIS — J984 Other disorders of lung: Secondary | ICD-10-CM | POA: Diagnosis not present

## 2023-03-21 NOTE — Progress Notes (Signed)
Daily Session Note  Patient Details  Name: [name] [name] MRN: [id number] Date of Birth: 03-07-1941 Referring Provider:   Doristine Devoid Pulmonary Rehab Walk Test from 12/28/2022 in [facility] for Heart, Vascular, & Lung Health  Referring Provider [name]       Encounter Date: 03/21/2023  Check In:  Session Check In - 03/21/23 1031       Check-In   Supervising physician immediately available to respond to emergencies [facility] MD immediately available    Physician(s) Carlyon Shadow, NP    Location [facility]    Staff Present Samantha Belarus, RD, LDN;Randi Dionisio Paschal, ACSM-CEP, Exercise Physiologist;Mary Gerre Scull, RN, Fuller Plan, RT    Virtual Visit No    Medication changes reported     No    Fall or balance concerns reported    No    Tobacco Cessation No Change    Warm-up and Cool-down Performed as group-led instruction    Resistance Training Performed Yes    VAD Patient? No    PAD/SET Patient? No      Pain Assessment   Currently in Pain? No/denies    Multiple Pain Sites No             Capillary Blood Glucose: No results found for this or any previous visit (from the past 24 hour(s)).    Social History   Tobacco Use  Smoking Status Never  Smokeless Tobacco Never    Goals Met:  Proper associated with RPD/PD & O2 Sat Exercise tolerated well No report of concerns or symptoms today Strength training completed today  Goals Unmet:  Not Applicable  Comments: Service time is from 1010 to 1140.    Dr. Mechele Collin is Medical Director for Pulmonary Rehab at [facility].

## 2023-03-23 ENCOUNTER — Encounter (HOSPITAL_COMMUNITY)
Admission: RE | Admit: 2023-03-23 | Discharge: 2023-03-23 | Disposition: A | Discharge status: Home / Self Care | Payer: Medicare Other | Source: Ambulatory Visit | Attending: Pulmonary Disease | Admitting: Pulmonary Disease

## 2023-03-23 VITALS — Wt 110.0 lb

## 2023-03-23 DIAGNOSIS — R531 Weakness: Secondary | ICD-10-CM | POA: Diagnosis not present

## 2023-03-23 DIAGNOSIS — J984 Other disorders of lung: Secondary | ICD-10-CM

## 2023-03-23 DIAGNOSIS — R0602 Shortness of breath: Secondary | ICD-10-CM | POA: Diagnosis not present

## 2023-03-23 DIAGNOSIS — R499 Unspecified voice and resonance disorder: Secondary | ICD-10-CM | POA: Diagnosis not present

## 2023-03-23 NOTE — Progress Notes (Signed)
Daily Session Note  Patient Details  Name: [name] [name] MRN: [id number] Date of Birth: 15-Dec-1940 Referring Provider:   Doristine Devoid Pulmonary Rehab Walk Test from 12/28/2022 in [facility] for Heart, Vascular, & Lung Health  Referring Provider [name]       Encounter Date: 03/23/2023  Check In:  Session Check In - 03/23/23 1031       Check-In   Supervising physician immediately available to respond to emergencies [facility] MD immediately available    Physician(s) Edd Fabian, NP    Location [facility]    Staff Present Samantha Belarus, RD, LDN;Alejandra Barna Dionisio Paschal, ACSM-CEP, Exercise Physiologist;Mary Gerre Scull, RN, Fuller Plan, RT    Virtual Visit No    Medication changes reported     No    Fall or balance concerns reported    No    Tobacco Cessation No Change    Warm-up and Cool-down Performed as group-led instruction    Resistance Training Performed Yes    VAD Patient? No    PAD/SET Patient? No      Pain Assessment   Currently in Pain? No/denies    Multiple Pain Sites No             Capillary Blood Glucose: No results found for this or any previous visit (from the past 24 hour(s)).    Social History   Tobacco Use  Smoking Status Never  Smokeless Tobacco Never    Goals Met:  Exercise tolerated well No report of concerns or symptoms today Strength training completed today  Goals Unmet:  Not Applicable  Comments: Service time is from 1014 to 1158.    Dr. Mechele Collin is Medical Director for Pulmonary Rehab at [facility].

## 2023-03-25 DIAGNOSIS — S0101XA Laceration without foreign body of scalp, initial encounter: Secondary | ICD-10-CM | POA: Diagnosis not present

## 2023-03-25 DIAGNOSIS — S0181XA Laceration without foreign body of other part of head, initial encounter: Secondary | ICD-10-CM | POA: Diagnosis not present

## 2023-03-25 DIAGNOSIS — W0110XA Fall on same level from slipping, tripping and stumbling with subsequent striking against unspecified object, initial encounter: Secondary | ICD-10-CM | POA: Diagnosis not present

## 2023-03-25 DIAGNOSIS — R220 Localized swelling, mass and lump, head: Secondary | ICD-10-CM | POA: Diagnosis not present

## 2023-03-25 DIAGNOSIS — S098XXA Other specified injuries of head, initial encounter: Secondary | ICD-10-CM | POA: Diagnosis not present

## 2023-03-25 DIAGNOSIS — I10 Essential (primary) hypertension: Secondary | ICD-10-CM | POA: Diagnosis not present

## 2023-03-25 DIAGNOSIS — S0990XA Unspecified injury of head, initial encounter: Secondary | ICD-10-CM | POA: Diagnosis not present

## 2023-03-25 DIAGNOSIS — R296 Repeated falls: Secondary | ICD-10-CM | POA: Diagnosis not present

## 2023-03-28 ENCOUNTER — Encounter (HOSPITAL_COMMUNITY): Payer: Medicare Other

## 2023-03-29 ENCOUNTER — Ambulatory Visit: Payer: Medicare Other | Admitting: Obstetrics and Gynecology

## 2023-03-29 DIAGNOSIS — R531 Weakness: Secondary | ICD-10-CM | POA: Diagnosis not present

## 2023-03-29 NOTE — Progress Notes (Signed)
Pulmonary Individual Treatment Plan  Patient Details  Name: [name] [name] MRN: [id number] Date of Birth: 31-May-1941 Referring Provider:   Doristine Devoid Pulmonary Rehab Walk Test from 12/28/2022 in [facility]  Referring Provider McNeil  [[place]]       Initial Encounter Date:  Flowsheet Row Pulmonary Rehab Walk Test from 12/28/2022 in [facility]  Date 12/28/22       Visit Diagnosis: Other disorders of lung  Patient's Home Medications on Admission:   Current Outpatient Medications:    albuterol (VENTOLIN HFA) 108 (90 Base) MCG/ACT inhaler, SMARTSIG:2 Inhalation Via Inhaler Every 6 Hours PRN, Disp: , Rfl:    amLODipine (NORVASC) 5 MG tablet, Take 1 tablet (5 mg total) by mouth daily., Disp: 90 tablet, Rfl: 3   aspirin 325 MG EC tablet, Take 325 mg by mouth every 6 (six) hours as needed for pain., Disp: , Rfl:    b complex vitamins tablet, Take 1 tablet by mouth daily., Disp: , Rfl:    budesonide-formoterol (SYMBICORT) 80-4.5 MCG/ACT inhaler, INHALE 2 PUFFS INTO THE LUNGS TWICE DAILY, Disp: 10.2 g, Rfl: 1   budesonide-formoterol (SYMBICORT) 80-4.5 MCG/ACT inhaler, INHALE 2 PUFFS INTO THE LUNGS TWICE DAILY, Disp: 10.2 g, Rfl: 1   CALCIUM PO, Take 1,200 mg by mouth daily. , Disp: , Rfl:    denosumab (PROLIA) 60 MG/ML SOLN injection, Inject 60 mg into the skin every 6 (six) months. Administer in upper arm, thigh, or abdomen, Disp: , Rfl:    dextromethorphan-guaiFENesin (MUCINEX DM) 30-600 MG 12hr tablet, Take 1 tablet by mouth 2 (two) times daily as needed for cough. (Patient not taking: Reported on 02/08/2023), Disp: 12 tablet, Rfl: 3   fexofenadine (ALLEGRA) 60 MG tablet, Take 1 tablet (60 mg total) by mouth daily for 15 days., Disp: 15 tablet, Rfl: 0   fluticasone (FLONASE) 50 MCG/ACT nasal spray, Place 2 sprays into both nostrils daily., Disp: 18.2 g, Rfl: 0   furosemide  (LASIX) 20 MG tablet, Take 1 tablet (20 mg total) by mouth daily as needed (leg swelling)., Disp: 30 tablet, Rfl: 3   levothyroxine (SYNTHROID) 100 MCG tablet, TAKE 1 TABLET(100 MCG) BY MOUTH DAILY, Disp: 90 tablet, Rfl: 2   metoprolol tartrate (LOPRESSOR) 100 MG tablet, Take 1 tablet (100 mg total) by mouth once for 1 dose. Take 2 hours prior to procedure., Disp: 1 tablet, Rfl: 0   pantoprazole (PROTONIX) 40 MG tablet, Take 1 tablet (40 mg total) by mouth in the morning. (Patient taking differently: Take 20 mg by mouth in the morning.), Disp: 30 tablet, Rfl: 0   Spacer/Aero-Holding Chambers (EASIVENT) inhaler, See admin instructions., Disp: , Rfl:   Past Medical History: Past Medical History:  Diagnosis Date   COVID 10/25/2020   Dog bite of thigh 04/09/2012   Elevated lipids    GERD (gastroesophageal reflux disease)    Hypertension    Hypothyroidism    post thyroidectomy   Osteoarthritis    Osteopenia    t -2.5 hip in 2005 by Dexa   Osteoporosis    commpr fx after dog related fall  injury   Seasonal rhinitis    spring and fall   Skin cancer    squamous cell ca chest   Vaginal prolapse    hs corrected    Tobacco Use: Social History   Tobacco Use  Smoking Status Never  Smokeless Tobacco Never    Labs:  Review Flowsheet  More data exists      Latest Ref Rng & Units 05/11/2018 05/14/2019 09/23/2020 08/30/2021 07/20/2022  Labs for ITP Cardiac and Pulmonary Rehab  Cholestrol 0 - 200 mg/dL 409  811  914  782  956   LDL (calc) 0 - 99 mg/dL 213  086  578  469  629   HDL-C >39.00 mg/dL 52.84  13.24  40.10  27.25  68.10   Trlycerides 0.0 - 149.0 mg/dL 36.6  44.0  34.7  425.9  121.0   Hemoglobin A1c 4.6 - 6.5 % - - 5.8  - -    Details            Capillary Blood Glucose: No results found for: "GLUCAP"   Pulmonary Assessment Scores:  Pulmonary Assessment Scores     Row Name 12/28/22 1058         ADL UCSD   ADL Phase Entry     SOB Score total 11       CAT Score    CAT Score 7       mMRC Score   mMRC Score 3             UCSD: Self-administered rating of dyspnea associated with activities of daily living (ADLs) 6-point scale (0 = "not at all" to 5 = "maximal or unable to do because of breathlessness")  Scoring Scores range from 0 to 120.  Minimally important difference is 5 units  CAT: CAT can identify the health impairment of COPD patients and is better correlated with disease progression.  CAT has a scoring range of zero to 40. The CAT score is classified into four groups of low (less than 10), medium (10 - 20), high (21-30) and very high (31-40) based on the impact level of disease on health status. A CAT score over 10 suggests significant symptoms.  A worsening CAT score could be explained by an exacerbation, poor medication adherence, poor inhaler technique, or progression of COPD or comorbid conditions.  CAT MCID is 2 points  mMRC: mMRC (Modified Medical Research Council) Dyspnea Scale is used to assess the degree of baseline functional disability in patients of respiratory disease due to dyspnea. No minimal important difference is established. A decrease in score of 1 point or greater is considered a positive change.   Pulmonary Function Assessment:  Pulmonary Function Assessment - 12/28/22 1132       Breath   Bilateral Breath Sounds --             Exercise Target Goals: Exercise Program Goal: Individual exercise prescription set using results from initial 6 min walk test and THRR while considering  patient's activity barriers and safety.   Exercise Prescription Goal: Initial exercise prescription builds to 30-45 minutes a day of aerobic activity, 2-3 days per week.  Home exercise guidelines will be given to patient during program as part of exercise prescription that the participant will acknowledge.  Activity Barriers & Risk Stratification:  Activity Barriers & Cardiac Risk Stratification - 12/28/22 1102       Activity  Barriers & Cardiac Risk Stratification   Activity Barriers Deconditioning;Muscular Weakness;Shortness of Breath;History of Falls;Arthritis;Neck/Spine Problems             6 Minute Walk:  6 Minute Walk     Row Name 12/28/22 1203         6 Minute Walk   Phase Initial     Distance 610 feet     Walk Time  6 minutes     # of Rest Breaks 4  4 rest breaks at: 1:46-2:07, 2:49-3:05, 3:49-4:32, 5:18-5:42     MPH 1.16     METS 1.63     RPE 13     Perceived Dyspnea  3     VO2 Peak 5.69     Symptoms Yes (comment)     Comments dyspnea     Resting HR 89 bpm     Resting BP 122/76     Resting Oxygen Saturation  98 %     Exercise Oxygen Saturation  during 6 min walk 86 %     Max Ex. HR 141 bpm     Max Ex. BP 140/82     2 Minute Post BP 122/78       Interval HR   1 Minute HR 114     2 Minute HR 116     3 Minute HR 129     4 Minute HR 141     5 Minute HR 135     6 Minute HR 130     2 Minute Post HR 103     Interval Heart Rate? Yes       Interval Oxygen   Interval Oxygen? Yes     Baseline Oxygen Saturation % 98 %     1 Minute Oxygen Saturation % 86 %  at 1:46 O2 dropped to 86%, PLB right up to 91%     1 Minute Liters of Oxygen 0 L     2 Minute Oxygen Saturation % 95 %     2 Minute Liters of Oxygen 0 L     3 Minute Oxygen Saturation % 86 %  2:49 O2 dropped to 86%, PLB up to 91%     3 Minute Liters of Oxygen 0 L     4 Minute Oxygen Saturation % 94 %     4 Minute Liters of Oxygen 0 L     5 Minute Oxygen Saturation % 92 %     5 Minute Liters of Oxygen 0 L     6 Minute Oxygen Saturation % 90 %     6 Minute Liters of Oxygen 0 L     2 Minute Post Oxygen Saturation % 94 %     2 Minute Post Liters of Oxygen 0 L              Oxygen Initial Assessment:  Oxygen Initial Assessment - 12/28/22 1058       Home Oxygen   Home Oxygen Device None    Sleep Oxygen Prescription None    Home Exercise Oxygen Prescription None    Home Resting Oxygen Prescription None      Initial 6  min Walk   Oxygen Used None      Program Oxygen Prescription   Program Oxygen Prescription None      Intervention   Short Term Goals To learn and understand importance of maintaining oxygen saturations>88%;To learn and demonstrate proper use of respiratory medications;To learn and understand importance of monitoring SPO2 with pulse oximeter and demonstrate accurate use of the pulse oximeter.;To learn and demonstrate proper pursed lip breathing techniques or other breathing techniques.     Long  Term Goals Verbalizes importance of monitoring SPO2 with pulse oximeter and return demonstration;Maintenance of O2 saturations>88%;Exhibits proper breathing techniques, such as pursed lip breathing or other method taught during program session;Compliance with respiratory medication  Oxygen Re-Evaluation:  Oxygen Re-Evaluation     Row Name 12/30/22 1028 01/20/23 1325 02/22/23 0938 03/22/23 1053       Program Oxygen Prescription   Program Oxygen Prescription None None None None      Home Oxygen   Home Oxygen Device None None None None    Sleep Oxygen Prescription None None None None    Home Exercise Oxygen Prescription None None None None    Home Resting Oxygen Prescription None None None None      Goals/Expected Outcomes   Short Term Goals To learn and understand importance of maintaining oxygen saturations>88%;To learn and demonstrate proper use of respiratory medications;To learn and understand importance of monitoring SPO2 with pulse oximeter and demonstrate accurate use of the pulse oximeter.;To learn and demonstrate proper pursed lip breathing techniques or other breathing techniques.  To learn and understand importance of maintaining oxygen saturations>88%;To learn and demonstrate proper use of respiratory medications;To learn and understand importance of monitoring SPO2 with pulse oximeter and demonstrate accurate use of the pulse oximeter.;To learn and demonstrate proper  pursed lip breathing techniques or other breathing techniques.  To learn and understand importance of maintaining oxygen saturations>88%;To learn and demonstrate proper use of respiratory medications;To learn and understand importance of monitoring SPO2 with pulse oximeter and demonstrate accurate use of the pulse oximeter.;To learn and demonstrate proper pursed lip breathing techniques or other breathing techniques.  To learn and understand importance of maintaining oxygen saturations>88%;To learn and demonstrate proper use of respiratory medications;To learn and understand importance of monitoring SPO2 with pulse oximeter and demonstrate accurate use of the pulse oximeter.;To learn and demonstrate proper pursed lip breathing techniques or other breathing techniques.     Long  Term Goals Verbalizes importance of monitoring SPO2 with pulse oximeter and return demonstration;Maintenance of O2 saturations>88%;Exhibits proper breathing techniques, such as pursed lip breathing or other method taught during program session;Compliance with respiratory medication Verbalizes importance of monitoring SPO2 with pulse oximeter and return demonstration;Maintenance of O2 saturations>88%;Exhibits proper breathing techniques, such as pursed lip breathing or other method taught during program session;Compliance with respiratory medication Verbalizes importance of monitoring SPO2 with pulse oximeter and return demonstration;Maintenance of O2 saturations>88%;Exhibits proper breathing techniques, such as pursed lip breathing or other method taught during program session;Compliance with respiratory medication Verbalizes importance of monitoring SPO2 with pulse oximeter and return demonstration;Maintenance of O2 saturations>88%;Exhibits proper breathing techniques, such as pursed lip breathing or other method taught during program session;Compliance with respiratory medication    Goals/Expected Outcomes Compliance and understanding of  oxygen saturation monitoring and breathing techniques to decrease shortness of breath. Compliance and understanding of oxygen saturation monitoring and breathing techniques to decrease shortness of breath. Compliance and understanding of oxygen saturation monitoring and breathing techniques to decrease shortness of breath. Compliance and understanding of oxygen saturation monitoring and breathing techniques to decrease shortness of breath.             Oxygen Discharge (Final Oxygen Re-Evaluation):  Oxygen Re-Evaluation - 03/22/23 1053       Program Oxygen Prescription   Program Oxygen Prescription None      Home Oxygen   Home Oxygen Device None    Sleep Oxygen Prescription None    Home Exercise Oxygen Prescription None    Home Resting Oxygen Prescription None      Goals/Expected Outcomes   Short Term Goals To learn and understand importance of maintaining oxygen saturations>88%;To learn and demonstrate proper use of respiratory medications;To learn and understand importance of monitoring  SPO2 with pulse oximeter and demonstrate accurate use of the pulse oximeter.;To learn and demonstrate proper pursed lip breathing techniques or other breathing techniques.     Long  Term Goals Verbalizes importance of monitoring SPO2 with pulse oximeter and return demonstration;Maintenance of O2 saturations>88%;Exhibits proper breathing techniques, such as pursed lip breathing or other method taught during program session;Compliance with respiratory medication    Goals/Expected Outcomes Compliance and understanding of oxygen saturation monitoring and breathing techniques to decrease shortness of breath.             Initial Exercise Prescription:  Initial Exercise Prescription - 12/28/22 1200       Date of Initial Exercise RX and Referring Provider   Date 12/28/22    Referring Provider [name]   Expected Discharge Date 03/23/23      Recumbant Bike   Level 1    RPM 30    Minutes 15     METs 1.5      NuStep   Level 1    SPM 50    Minutes 15    METs 1.5      Prescription Details   Frequency (times per week) 2    Duration Progress to 30 minutes of continuous aerobic without signs/symptoms of physical distress      Intensity   THRR 40-80% of Max Heartrate 56-111    Ratings of Perceived Exertion 11-13    Perceived Dyspnea 0-4      Progression   Progression Continue to progress workloads to maintain intensity without signs/symptoms of physical distress.      Resistance Training   Training Prescription Yes    Weight red bands    Reps 10-15             Perform Capillary Blood Glucose checks as needed.  Exercise Prescription Changes:   Exercise Prescription Changes     Row Name 01/03/23 1200 01/17/23 1200 01/31/23 1200 02/14/23 1200 02/21/23 1212     Response to Exercise   Blood Pressure (Admit) 124/70 116/66 140/72 108/64 112/68   Blood Pressure (Exercise) 142/68 150/70 -- 158/78 --   Blood Pressure (Exit) 118/60 110/78 108/60 120/68 118/62   Heart Rate (Admit) 89 bpm 86 bpm 97 bpm 91 bpm 81 bpm   Heart Rate (Exercise) 101 bpm 103 bpm 111 bpm 106 bpm 96 bpm   Heart Rate (Exit) 93 bpm 85 bpm 97 bpm 96 bpm 85 bpm   Oxygen Saturation (Admit) 97 % 98 % 98 % 95 % 93 %   Oxygen Saturation (Exercise) 94 % 96 % 93 % 94 % 95 %   Oxygen Saturation (Exit) 96 % 97 % 97 % 96 % 97 %   Rating of Perceived Exertion (Exercise) 89 13 15 13 13    Perceived Dyspnea (Exercise) 101 0 1 1 1    Symptoms 93 -- -- -- --   Duration Progress to 30 minutes of  aerobic without signs/symptoms of physical distress Continue with 30 min of aerobic exercise without signs/symptoms of physical distress. Continue with 30 min of aerobic exercise without signs/symptoms of physical distress. Continue with 30 min of aerobic exercise without signs/symptoms of physical distress. Continue with 30 min of aerobic exercise without signs/symptoms of physical distress.   Intensity THRR unchanged THRR  unchanged THRR unchanged THRR unchanged THRR unchanged     Progression   Progression Continue to progress workloads to maintain intensity without signs/symptoms of physical distress. -- Continue to progress workloads to maintain intensity without  signs/symptoms of physical distress. Continue to progress workloads to maintain intensity without signs/symptoms of physical distress. Continue to progress workloads to maintain intensity without signs/symptoms of physical distress.   Average METs -- -- 2.4 -- --     Resistance Training   Training Prescription Yes Yes Yes Yes Yes   Weight red bands red bands red bands red bands red bands   Reps 10-15 10-15 10-15 10-15 10-15   Time 10 Minutes 10 Minutes 10 Minutes 10 Minutes 10 Minutes     Recumbant Bike   Level 1 3 4  -- 3   Minutes 15 15 15  -- 15   METs 1.7 2 2.4 -- --     NuStep   Level 1 2 3 3 3    SPM -- -- -- 74 --   Minutes 15 15 15 15 15    METs 1.6 1.8 1.9 1.9 1.8     Track   Laps -- -- -- 7 --   Minutes -- -- -- 15 --   METs -- -- -- 2.08 --    Row Name 03/14/23 1200 03/14/23 1208 03/23/23 1225         Response to Exercise   Blood Pressure (Admit) 124/78 124/78 128/70     Blood Pressure (Exercise) 180/82 180/82 --     Blood Pressure (Exit) 130/80 130/80 112/68     Heart Rate (Admit) 88 bpm 88 bpm 87 bpm     Heart Rate (Exercise) 101 bpm 101 bpm 102 bpm     Heart Rate (Exit) 85 bpm 85 bpm 94 bpm     Oxygen Saturation (Admit) 96 % 96 % 96 %     Oxygen Saturation (Exercise) 92 % 92 % 95 %     Oxygen Saturation (Exit) 94 % 94 % 95 %     Rating of Perceived Exertion (Exercise) 12 12 13      Perceived Dyspnea (Exercise) 1 1 1      Duration Continue with 30 min of aerobic exercise without signs/symptoms of physical distress. Continue with 30 min of aerobic exercise without signs/symptoms of physical distress. Continue with 30 min of aerobic exercise without signs/symptoms of physical distress.     Intensity THRR unchanged THRR  unchanged THRR unchanged       Progression   Progression Continue to progress workloads to maintain intensity without signs/symptoms of physical distress. Continue to progress workloads to maintain intensity without signs/symptoms of physical distress. Continue to progress workloads to maintain intensity without signs/symptoms of physical distress.     Average METs 2.5 2.5 --       Resistance Training   Training Prescription Yes Yes Yes     Weight red bands red bands red bands     Reps 10-15 10-15 10-15     Time 10 Minutes 10 Minutes 10 Minutes       Recumbant Bike   Level 3 3 4      Minutes 15 15 15      METs 2.5 2.5 2.7       NuStep   Level 3 3 3      Minutes 15 15 15      METs 1.7 1.7 1.6              Exercise Comments:   Exercise Comments     Row Name 03/09/23 1517           Exercise Comments Completed home ExRx. [name] [name] is currently exercising at home. She cycles on a stationary bike 2 non-rehab  days/wk for 30 min/day. [name] [name] goes to her son's gym and exercises with him. He is very motivated to have [place] exercise. I believe [name] [name]'s son will keep her exercising even after rehab. I discussed increasing her frequency after rehab. She agreed with my recommendation. [name] [name] is motivated to exercise and improve her functional capacity.                Exercise Goals and Review:   Exercise Goals     Row Name 12/28/22 1103 01/20/23 1320 02/22/23 0924         Exercise Goals   Increase Physical Activity Yes Yes Yes     Intervention Provide advice, education, support and counseling about physical activity/exercise needs.;Develop an individualized exercise prescription for aerobic and resistive training based on initial evaluation findings, risk stratification, comorbidities and participant's personal goals. Provide advice, education, support and counseling about physical activity/exercise needs.;Develop an individualized exercise prescription for aerobic and resistive training  based on initial evaluation findings, risk stratification, comorbidities and participant's personal goals. Provide advice, education, support and counseling about physical activity/exercise needs.;Develop an individualized exercise prescription for aerobic and resistive training based on initial evaluation findings, risk stratification, comorbidities and participant's personal goals.     Expected Outcomes Short Term: Attend rehab on a regular basis to increase amount of physical activity.;Long Term: Exercising regularly at least 3-5 days a week.;Long Term: Add in home exercise to make exercise part of routine and to increase amount of physical activity. Short Term: Attend rehab on a regular basis to increase amount of physical activity.;Long Term: Exercising regularly at least 3-5 days a week.;Long Term: Add in home exercise to make exercise part of routine and to increase amount of physical activity. Short Term: Attend rehab on a regular basis to increase amount of physical activity.;Long Term: Exercising regularly at least 3-5 days a week.;Long Term: Add in home exercise to make exercise part of routine and to increase amount of physical activity.     Increase Strength and Stamina Yes Yes Yes     Intervention Provide advice, education, support and counseling about physical activity/exercise needs.;Develop an individualized exercise prescription for aerobic and resistive training based on initial evaluation findings, risk stratification, comorbidities and participant's personal goals. Provide advice, education, support and counseling about physical activity/exercise needs.;Develop an individualized exercise prescription for aerobic and resistive training based on initial evaluation findings, risk stratification, comorbidities and participant's personal goals. Provide advice, education, support and counseling about physical activity/exercise needs.;Develop an individualized exercise prescription for aerobic and  resistive training based on initial evaluation findings, risk stratification, comorbidities and participant's personal goals.     Expected Outcomes Short Term: Increase workloads from initial exercise prescription for resistance, speed, and METs.;Short Term: Perform resistance training exercises routinely during rehab and add in resistance training at home;Long Term: Improve cardiorespiratory fitness, muscular endurance and strength as measured by increased METs and functional capacity ( ) Short Term: Increase workloads from initial exercise prescription for resistance, speed, and METs.;Short Term: Perform resistance training exercises routinely during rehab and add in resistance training at home;Long Term: Improve cardiorespiratory fitness, muscular endurance and strength as measured by increased METs and functional capacity ( ) Short Term: Increase workloads from initial exercise prescription for resistance, speed, and METs.;Short Term: Perform resistance training exercises routinely during rehab and add in resistance training at home;Long Term: Improve cardiorespiratory fitness, muscular endurance and strength as measured by increased METs and functional capacity ( )     Able to understand and use rate of perceived exertion (  RPE) scale Yes Yes Yes     Intervention Provide education and explanation on how to use RPE scale Provide education and explanation on how to use RPE scale Provide education and explanation on how to use RPE scale     Expected Outcomes Short Term: Able to use RPE daily in rehab to express subjective intensity level;Long Term:  Able to use RPE to guide intensity level when exercising independently Short Term: Able to use RPE daily in rehab to express subjective intensity level;Long Term:  Able to use RPE to guide intensity level when exercising independently Short Term: Able to use RPE daily in rehab to express subjective intensity level;Long Term:  Able to use RPE to guide  intensity level when exercising independently     Able to understand and use Dyspnea scale Yes Yes Yes     Intervention Provide education and explanation on how to use Dyspnea scale Provide education and explanation on how to use Dyspnea scale Provide education and explanation on how to use Dyspnea scale     Expected Outcomes Short Term: Able to use Dyspnea scale daily in rehab to express subjective sense of shortness of breath during exertion;Long Term: Able to use Dyspnea scale to guide intensity level when exercising independently Short Term: Able to use Dyspnea scale daily in rehab to express subjective sense of shortness of breath during exertion;Long Term: Able to use Dyspnea scale to guide intensity level when exercising independently Short Term: Able to use Dyspnea scale daily in rehab to express subjective sense of shortness of breath during exertion;Long Term: Able to use Dyspnea scale to guide intensity level when exercising independently     Knowledge and understanding of Target Heart Rate Range (THRR) Yes Yes Yes     Intervention Provide education and explanation of THRR including how the numbers were predicted and where they are located for reference Provide education and explanation of THRR including how the numbers were predicted and where they are located for reference Provide education and explanation of THRR including how the numbers were predicted and where they are located for reference     Expected Outcomes Short Term: Able to state/look up THRR;Long Term: Able to use THRR to govern intensity when exercising independently;Short Term: Able to use daily as guideline for intensity in rehab Short Term: Able to state/look up THRR;Long Term: Able to use THRR to govern intensity when exercising independently;Short Term: Able to use daily as guideline for intensity in rehab Short Term: Able to state/look up THRR;Long Term: Able to use THRR to govern intensity when exercising independently;Short  Term: Able to use daily as guideline for intensity in rehab     Understanding of Exercise Prescription Yes Yes Yes     Intervention Provide education, explanation, and written materials on patient's individual exercise prescription Provide education, explanation, and written materials on patient's individual exercise prescription Provide education, explanation, and written materials on patient's individual exercise prescription     Expected Outcomes Short Term: Able to explain program exercise prescription;Long Term: Able to explain home exercise prescription to exercise independently Short Term: Able to explain program exercise prescription;Long Term: Able to explain home exercise prescription to exercise independently Short Term: Able to explain program exercise prescription;Long Term: Able to explain home exercise prescription to exercise independently              Exercise Goals Re-Evaluation :  Exercise Goals Re-Evaluation     Row Name 12/30/22 1025 01/20/23 1320 02/22/23 0924 03/22/23 2956  Exercise Goal Re-Evaluation   Exercise Goals Review Increase Physical Activity;Able to understand and use Dyspnea scale;Understanding of Exercise Prescription;Increase Strength and Stamina;Knowledge and understanding of Target Heart Rate Range (THRR);Able to understand and use rate of perceived exertion (RPE) scale Increase Physical Activity;Able to understand and use Dyspnea scale;Understanding of Exercise Prescription;Increase Strength and Stamina;Knowledge and understanding of Target Heart Rate Range (THRR);Able to understand and use rate of perceived exertion (RPE) scale Increase Physical Activity;Able to understand and use Dyspnea scale;Understanding of Exercise Prescription;Increase Strength and Stamina;Knowledge and understanding of Target Heart Rate Range (THRR);Able to understand and use rate of perceived exertion (RPE) scale Increase Physical Activity;Able to understand and use Dyspnea  scale;Understanding of Exercise Prescription;Increase Strength and Stamina;Knowledge and understanding of Target Heart Rate Range (THRR);Able to understand and use rate of perceived exertion (RPE) scale    Comments [name] [name] is scheduled to begin exercsise not week. Will continue to monitor and progress as able. [name] [name] has completed 6 exercise sessions. She exercises for 15 min on the recumbent bike and Nustep. [name] [name] averages 2.4 METs at level 3 on the recumbent bike and 1.8 METs at level 2 on the Nustep. [name] [name] performs the warmup and cooldown standing without limitations. She has increased her workload for both exercises modes as she tolerates progressions well. METs have increased for both exercise modes. [name] [name] is starting to understand her ExRx more. She seems motivated to exercise and increase her functional capacity. She would get short of breath when she started rehab. Her shortness of breath has improved with exercise. Will continue to monitor and progress as able. [name] [name] has completed 14 exercise sessions. She exercises for 15 min on the recumbent bike and Nustep. [name] [name] averages 2.3 METs at level 3 on the recumbent bike and 1.8 METs at level 3 on the Nustep. [name] [name] performs the warmup and cooldown standing without limitations. She has increased her workload for the Nustep as METs have remained the same. Staff have encouraged [name] [name] to walk the track using a rollator. [name] [name] has walked the track a couple of sessions but not consistently. She prefers to use the recumbent bike over walking the track. Will continue to monitor and progress as able. [name] [name] has completed 20 exercise sessions. She exercises for 15 min on the recumbent bike and Nustep. [name] [name] averages 2.6 METs at level 3 on the recumbent bike and 1.6 METs at level 3 on the Nustep. [name] [name] performs the warmup and cooldown standing without limitations. She has increased her level and METs on the recumbent bike but not the Nustep. [name] [name] still prefers to not walk  the track despite encouragement. We have recently discussed home exercise as [name] [name] exercises outside of rehab. She exercises with her son. [name] [name]'s son is very encouraging of her exercise. Will continue to monitor and progress as able.    Expected Outcomes Through exercise at rehab and home, the patient will decrease shortness of breath with daily activities and feel confident in carrying out an exercise regimen at home. Through exercise at rehab and home, the patient will decrease shortness of breath with daily activities and feel confident in carrying out an exercise regimen at home. Through exercise at rehab and home, the patient will decrease shortness of breath with daily activities and feel confident in carrying out an exercise regimen at home. Through exercise at rehab and home, the patient will decrease shortness of breath with daily activities and feel confident in carrying out an exercise regimen at home.  Discharge Exercise Prescription (Final Exercise Prescription Changes):  Exercise Prescription Changes - 03/23/23 1225       Response to Exercise   Blood Pressure (Admit) 128/70    Blood Pressure (Exit) 112/68    Heart Rate (Admit) 87 bpm    Heart Rate (Exercise) 102 bpm    Heart Rate (Exit) 94 bpm    Oxygen Saturation (Admit) 96 %    Oxygen Saturation (Exercise) 95 %    Oxygen Saturation (Exit) 95 %    Rating of Perceived Exertion (Exercise) 13    Perceived Dyspnea (Exercise) 1    Duration Continue with 30 min of aerobic exercise without signs/symptoms of physical distress.    Intensity THRR unchanged      Progression   Progression Continue to progress workloads to maintain intensity without signs/symptoms of physical distress.      Resistance Training   Training Prescription Yes    Weight red bands    Reps 10-15    Time 10 Minutes      Recumbant Bike   Level 4    Minutes 15    METs 2.7      NuStep   Level 3    Minutes 15    METs 1.6              Nutrition:  Target Goals: Understanding of nutrition guidelines, daily intake of sodium 1500mg , cholesterol 200mg , calories 30% from fat and 7% or less from saturated fats, daily to have 5 or more servings of fruits and vegetables.  Biometrics:  Pre Biometrics - 12/28/22 1209       Pre Biometrics   Grip Strength 12 kg              Nutrition Therapy Plan and Nutrition Goals:  Nutrition Therapy & Goals - 03/02/23 1141       Nutrition Therapy   Diet General Healthy Diet    Drug/Food Interactions Statins/Certain Fruits      Personal Nutrition Goals   Nutrition Goal Patient to identify strategies for weight maintenance/weight gain as needed throughout Pulmonary Rehab.   goal not met.   Comments Goal not met. [name] [name] reports history of unwanted weight loss over the last two year; per documentation, she was 150# July of 2023. Unwanted weight loss has been worked up through GI with normal findings. She did have a swallow study done and was diagnosed with dysphagia. She continues follow-up with neurology as well. At this point, she does not want to gain weight but would like to maintain her current weight. She does not cook. She reports prioriziting protein foods such as eggs, protein bars, Ensure and then typically eats protein/vegetable based meal at dinner meal. [name] [name] is down 6.16# since starting with our program; reviewed strategies for weight gain/weight maintenance including nutrition supplements, increasing eating frequency, increasing calories from fat/carbohydrates/protein, etc. [name] [name] will benefit from from participation in pulmonary rehab for nutrition, exercise, and lifestyle modification.      Intervention Plan   Intervention Prescribe, educate and counsel regarding individualized specific dietary modifications aiming towards targeted core components such as weight, hypertension, lipid management, diabetes, heart failure and other comorbidities.;Nutrition handout(s) given to  patient.    Expected Outcomes Short Term Goal: Understand basic principles of dietary content, such as calories, fat, sodium, cholesterol and nutrients.;Long Term Goal: Adherence to prescribed nutrition plan.             Nutrition Assessments:  Nutrition Assessments - 01/03/23 1111  Rate Your Plate Scores   Pre Score 63            MEDIFICTS Score Key: >=70 Need to make dietary changes  40-70 Heart Healthy Diet <= 40 Therapeutic Level Cholesterol Diet  Flowsheet Row PULMONARY REHAB OTHER RESPIRATORY from 01/03/2023 in [facility] for Heart, Vascular, & Lung Health  Picture Your Plate Total Score on Admission 63      Picture Your Plate Scores: <41 Unhealthy dietary pattern with much room for improvement. 41-50 Dietary pattern unlikely to meet recommendations for good health and room for improvement. 51-60 More healthful dietary pattern, with some room for improvement.  >60 Healthy dietary pattern, although there may be some specific behaviors that could be improved.    Nutrition Goals Re-Evaluation:  Nutrition Goals Re-Evaluation     Row Name 01/03/23 1101 01/31/23 1521 03/02/23 1141         Goals   Current Weight 120 lb 5.9 oz (54.6 kg) 119 lb 11.4 oz (54.3 kg) 115 lb 4.8 oz (52.3 kg)     Comment cholesterol 230, LDL 138 (cholesterol is treated with "low fat diet" per provider notes) no new labs; most recent labs  cholesterol 230, LDL 138 (cholesterol is treated with "low fat diet" per provider notes) no new labs; most recent labs cholesterol 230, LDL 138 (cholesterol is treated with "low fat diet" per provider notes)     Expected Outcome [name] [name] reports history of unwanted weight loss over the last two year; per documentation, she was 150# July of 2023. Unwanted weight loss has been worked up through GI with normal findings.  At this point, she does not want to gain weight but would like to maintain her current weight. She does not cook. She  reports prioriziting protein foods such as eggs, protein bars, Ensure and then typically eats protein, vegetable based meal at dinner meal. [name] [name] will benefit from from participation in pulmonary rehab for nutrition, exercise, and lifestyle modification. Goal not met. [name] [name] reports history of unwanted weight loss over the last two year; per documentation, she was 150# July of 2023. Unwanted weight loss has been worked up through GI with normal findings. At this point, she does not want to gain weight but would like to maintain her current weight. She does not cook. She reports prioriziting protein foods such as eggs, protein bars, Ensure and then typically eats protein/vegetable based meal at dinner meal. [name] [name] is down 1.76# since starting with our program; reviewed strategies for weight gain/weight maintenance including nutrition supplements, increasing eating frequency, increasing calories from fat/carbohydrates/protein, etc. [name] [name] will benefit from from participation in pulmonary rehab for nutrition, exercise, and lifestyle modification Goal not met. [name] [name] reports history of unwanted weight loss over the last two year; per documentation, she was 150# July of 2023. Unwanted weight loss has been worked up through GI with normal findings. She did have a swallow study done and was diagnosed with dysphagia. She continues follow-up with neurology as well. At this point, she does not want to gain weight but would like to maintain her current weight. She does not cook. She reports prioriziting protein foods such as eggs, protein bars, Ensure and then typically eats protein/vegetable based meal at dinner meal. [name] [name] is down 6.16# since starting with our program; reviewed strategies for weight gain/weight maintenance including nutrition supplements, increasing eating frequency, increasing calories from fat/carbohydrates/protein, etc. [name] [name] will benefit from from participation in pulmonary rehab for nutrition, exercise, and  lifestyle modification.  Nutrition Goals Discharge (Final Nutrition Goals Re-Evaluation):  Nutrition Goals Re-Evaluation - 03/02/23 1141       Goals   Current Weight 115 lb 4.8 oz (52.3 kg)    Comment no new labs; most recent labs cholesterol 230, LDL 138 (cholesterol is treated with "low fat diet" per provider notes)    Expected Outcome Goal not met. [name] [name] reports history of unwanted weight loss over the last two year; per documentation, she was 150# July of 2023. Unwanted weight loss has been worked up through GI with normal findings. She did have a swallow study done and was diagnosed with dysphagia. She continues follow-up with neurology as well. At this point, she does not want to gain weight but would like to maintain her current weight. She does not cook. She reports prioriziting protein foods such as eggs, protein bars, Ensure and then typically eats protein/vegetable based meal at dinner meal. [name] [name] is down 6.16# since starting with our program; reviewed strategies for weight gain/weight maintenance including nutrition supplements, increasing eating frequency, increasing calories from fat/carbohydrates/protein, etc. [name] [name] will benefit from from participation in pulmonary rehab for nutrition, exercise, and lifestyle modification.             Psychosocial: Target Goals: Acknowledge presence or absence of significant depression and/or stress, maximize coping skills, provide positive support system. Participant is able to verbalize types and ability to use techniques and skills needed for reducing stress and depression.  Initial Review & Psychosocial Screening:  Initial Psych Review & Screening - 12/28/22 1054       Initial Review   Current issues with None Identified      Family Dynamics   Good Support System? Yes    Comments son      Barriers   Psychosocial barriers to participate in program There are no identifiable barriers or psychosocial needs.              Quality of Life Scores:  Scores of 19 and below usually indicate a poorer quality of life in these areas.  A difference of  2-3 points is a clinically meaningful difference.  A difference of 2-3 points in the total score of the Quality of Life Index has been associated with significant improvement in overall quality of life, self-image, physical symptoms, and general health in studies assessing change in quality of life.  PHQ-9: Review Flowsheet  More data exists      12/28/2022 11/28/2022 07/20/2022 07/14/2022 12/29/2021  Depression screen PHQ 2/9  Decreased Interest 0 0 0 0 0  Down, Depressed, Hopeless 0 0 0 0 0  PHQ - 2 Score 0 0 0 0 0  Altered sleeping 1 - 0 0 0  Tired, decreased energy 1 - 2 1 1   Change in appetite 1 - 1 0 0  Feeling bad or failure about yourself  0 - 0 0 0  Trouble concentrating 0 - 0 0 0  Moving slowly or fidgety/restless 0 - 0 0 0  Suicidal thoughts 0 - 0 0 0  PHQ-9 Score 3 - 3 1 1   Difficult doing work/chores Not difficult at all - Not difficult at all Somewhat difficult Not difficult at all    Details           Interpretation of Total Score  Total Score Depression Severity:  1-4 = Minimal depression, 5-9 = Mild depression, 10-14 = Moderate depression, 15-19 = Moderately severe depression, 20-27 = Severe depression   Psychosocial Evaluation and Intervention:  Psychosocial Evaluation -  12/28/22 1055       Psychosocial Evaluation & Interventions   Interventions Encouraged to exercise with the program and follow exercise prescription    Comments [name] [name] denies any psychosocal barriers to exercise.    Expected Outcomes For [name] [name] to participate in PR free of psychosocial barriers.    Continue Psychosocial Services  No Follow up required             Psychosocial Re-Evaluation:  Psychosocial Re-Evaluation     Row Name 01/02/23 [phone] 01/27/23 1044 02/22/23 0925         Psychosocial Re-Evaluation   Current issues with None Identified None  Identified None Identified     Comments No new psychosocial barriers or concerns since orientation on 7/3 [name] [name] denies any psychosocial barriers or concerns at this time. [name] [name] denies any psychosocial barriers or concerns at this time.     Expected Outcomes For [name] [name] to participate in PR free of any psychosocial barriers or concerns For [name] [name] to participate in PR free of any psychosocial barriers or concerns For [name] [name] to participate in PR free of any psychosocial barriers or concerns     Interventions Encouraged to attend Pulmonary Rehabilitation for the exercise Encouraged to attend Pulmonary Rehabilitation for the exercise Encouraged to attend Pulmonary Rehabilitation for the exercise     Continue Psychosocial Services  No Follow up required No Follow up required No Follow up required              Psychosocial Discharge (Final Psychosocial Re-Evaluation):  Psychosocial Re-Evaluation - 02/22/23 0925       Psychosocial Re-Evaluation   Current issues with None Identified    Comments [name] [name] denies any psychosocial barriers or concerns at this time.    Expected Outcomes For [name] [name] to participate in PR free of any psychosocial barriers or concerns    Interventions Encouraged to attend Pulmonary Rehabilitation for the exercise    Continue Psychosocial Services  No Follow up required             Education: Education Goals: Education classes will be provided on a weekly basis, covering required topics. Participant will state understanding/return demonstration of topics presented.  Learning Barriers/Preferences:  Learning Barriers/Preferences - 12/28/22 1055       Learning Barriers/Preferences   Learning Barriers Sight    Learning Preferences Computer/Internet;Written Material;Pictoral             Education Topics: Know Your Numbers Group instruction that is supported by a PowerPoint presentation. Instructor discusses importance of knowing and understanding resting, exercise,  and post-exercise oxygen saturation, heart rate, and blood pressure. Oxygen saturation, heart rate, blood pressure, rating of perceived exertion, and dyspnea are reviewed along with a normal range for these values.  Flowsheet Row PULMONARY REHAB OTHER RESPIRATORY from 01/05/2023 in [facility] for Heart, Vascular, & Lung Health  Date 01/05/23  Educator EP  Instruction Review Code 1- Verbalizes Understanding       Exercise for the Pulmonary Patient Group instruction that is supported by a PowerPoint presentation. Instructor discusses benefits of exercise, core components of exercise, frequency, duration, and intensity of an exercise routine, importance of utilizing pulse oximetry during exercise, safety while exercising, and options of places to exercise outside of rehab.  Flowsheet Row PULMONARY REHAB OTHER RESPIRATORY from 03/23/2023 in [facility] for Heart, Vascular, & Lung Health  Date 03/23/23  Educator EP  Instruction Review Code 1- Verbalizes Understanding       MET Level  Group instruction provided by PowerPoint, verbal discussion, and written material to support subject matter. Instructor reviews what METs are and how to increase METs.    Pulmonary Medications Verbally interactive group education provided by instructor with focus on inhaled medications and proper administration. Flowsheet Row PULMONARY REHAB OTHER RESPIRATORY from 03/16/2023 in [facility] for Heart, Vascular, & Lung Health  Date 03/16/23  Educator RT  Instruction Review Code 1- Verbalizes Understanding       Anatomy and Physiology of the Respiratory System Group instruction provided by PowerPoint, verbal discussion, and written material to support subject matter. Instructor reviews respiratory cycle and anatomical components of the respiratory system and their functions. Instructor also reviews differences in obstructive and restrictive  respiratory diseases with examples of each.  Flowsheet Row PULMONARY REHAB OTHER RESPIRATORY from 03/09/2023 in [facility] for Heart, Vascular, & Lung Health  Date 03/09/23  Educator RT  Instruction Review Code 1- Verbalizes Understanding       Oxygen Safety Group instruction provided by PowerPoint, verbal discussion, and written material to support subject matter. There is an overview of "What is Oxygen" and "Why do we need it".  Instructor also reviews how to create a safe environment for oxygen use, the importance of using oxygen as prescribed, and the risks of noncompliance. There is a brief discussion on traveling with oxygen and resources the patient may utilize. Flowsheet Row PULMONARY REHAB OTHER RESPIRATORY from 01/12/2023 in [facility] for Heart, Vascular, & Lung Health  Date 01/12/23  Educator RN  Instruction Review Code 1- Verbalizes Understanding       Oxygen Use Group instruction provided by PowerPoint, verbal discussion, and written material to discuss how supplemental oxygen is prescribed and different types of oxygen supply systems. Resources for more information are provided.  Flowsheet Row PULMONARY REHAB OTHER RESPIRATORY from 01/19/2023 in [facility] for Heart, Vascular, & Lung Health  Date 01/19/23  Educator RT  Instruction Review Code 1- Verbalizes Understanding       Breathing Techniques Group instruction that is supported by demonstration and informational handouts. Instructor discusses the benefits of pursed lip and diaphragmatic breathing and detailed demonstration on how to perform both.  Flowsheet Row PULMONARY REHAB OTHER RESPIRATORY from 01/26/2023 in [facility] for Heart, Vascular, & Lung Health  Date 01/26/23  Educator RN  Instruction Review Code 1- Verbalizes Understanding        Risk Factor Reduction Group instruction that is supported by a  PowerPoint presentation. Instructor discusses the definition of a risk factor, different risk factors for pulmonary disease, and how the heart and lungs work together. Flowsheet Row PULMONARY REHAB OTHER RESPIRATORY from 02/16/2023 in [facility] for Heart, Vascular, & Lung Health  Date 02/16/23  Educator EP  Instruction Review Code 1- Verbalizes Understanding       Pulmonary Diseases Group instruction provided by PowerPoint, verbal discussion, and written material to support subject matter. Instructor gives an overview of the different type of pulmonary diseases. There is also a discussion on risk factors and symptoms as well as ways to manage the diseases.   Stress and Energy Conservation Group instruction provided by PowerPoint, verbal discussion, and written material to support subject matter. Instructor gives an overview of stress and the impact it can have on the body. Instructor also reviews ways to reduce stress. There is also a discussion on energy conservation and ways to conserve energy throughout  the day.   Warning Signs and Symptoms Group instruction provided by PowerPoint, verbal discussion, and written material to support subject matter. Instructor reviews warning signs and symptoms of stroke, heart attack, cold and flu. Instructor also reviews ways to prevent the spread of infection.   Other Education Group or individual verbal, written, or video instructions that support the educational goals of the pulmonary rehab program. Flowsheet Row PULMONARY REHAB OTHER RESPIRATORY from 03/02/2023 in [facility] for Heart, Vascular, & Lung Health  Date 03/02/23  Educator RT  Instruction Review Code 1- Verbalizes Understanding        Knowledge Questionnaire Score:   Core Components/Risk Factors/Patient Goals at Admission:  Personal Goals and Risk Factors at Admission - 12/28/22 1056       Core Components/Risk Factors/Patient  Goals on Admission    Weight Management Weight Gain;Yes    Intervention Weight Management: Develop a combined nutrition and exercise program designed to reach desired caloric intake, while maintaining appropriate intake of nutrient and fiber, sodium and fats, and appropriate energy expenditure required for the weight goal.;Weight Management: Provide education and appropriate resources to help participant work on and attain dietary goals.    Expected Outcomes Short Term: Continue to assess and modify interventions until short term weight is achieved;Long Term: Adherence to nutrition and physical activity/exercise program aimed toward attainment of established weight goal;Weight Maintenance: Understanding of the daily nutrition guidelines, which includes 25-35% calories from fat, 7% or less cal from saturated fats, less than 200mg  cholesterol, less than 1.5gm of sodium, & 5 or more servings of fruits and vegetables daily;Weight Gain: Understanding of general recommendations for a high calorie, high protein meal plan that promotes weight gain by distributing calorie intake throughout the day with the consumption for 4-5 meals, snacks, and/or supplements;Understanding of distribution of calorie intake throughout the day with the consumption of 4-5 meals/snacks;Understanding recommendations for meals to include 15-35% energy as protein, 25-35% energy from fat, 35-60% energy from carbohydrates, less than 200mg  of dietary cholesterol, 20-35 gm of total fiber daily    Improve shortness of breath with ADL's Yes    Intervention Provide education, individualized exercise plan and daily activity instruction to help decrease symptoms of SOB with activities of daily living.    Expected Outcomes Short Term: Improve cardiorespiratory fitness to achieve a reduction of symptoms when performing ADLs;Long Term: Be able to perform more ADLs without symptoms or delay the onset of symptoms             Core Components/Risk  Factors/Patient Goals Review:   Goals and Risk Factor Review     Row Name 01/02/23 0936 01/27/23 1045 02/22/23 0935         Core Components/Risk Factors/Patient Goals Review   Personal Goals Review Weight Management/Obesity;Improve shortness of breath with ADL's;Develop more efficient breathing techniques such as purse lipped breathing and diaphragmatic breathing and practicing self-pacing with activity. Weight Management/Obesity;Improve shortness of breath with ADL's;Develop more efficient breathing techniques such as purse lipped breathing and diaphragmatic breathing and practicing self-pacing with activity. Weight Management/Obesity;Improve shortness of breath with ADL's     Review [name] [name] is scheduled to start the program on 7/9. We will monitor her progress throughout the program. Goal progressing for weight gain. [name] [name] is working with staff dietician to achieve weight gain goals. Goal progressing for improving shortness of breath with ADL's. Goal met on developing more efficient breathing techniques such as purse lipped breathing and diaphragmatic breathing; and practicing self-pacing with activity. We will  continue to monitor [name] [name]'s progress throughout the program. [name] [name] had initially wanted to gain weight prior to starting the program, but based on discussion with the staff dietician she just wants to maintain her weight at this time. Goal progressing on maintaining weight. Goal progressing for improving shortness of breath with ADL's. We will continue to monitor [name] [name]'s progress throughout the program.     Expected Outcomes See admission goals See admission goals See admission goals              Core Components/Risk Factors/Patient Goals at Discharge (Final Review):   Goals and Risk Factor Review - 02/22/23 0935       Core Components/Risk Factors/Patient Goals Review   Personal Goals Review Weight Management/Obesity;Improve shortness of breath with ADL's    Review [name] [name] had initially  wanted to gain weight prior to starting the program, but based on discussion with the staff dietician she just wants to maintain her weight at this time. Goal progressing on maintaining weight. Goal progressing for improving shortness of breath with ADL's. We will continue to monitor [name] [name]'s progress throughout the program.    Expected Outcomes See admission goals             ITP Comments: Pt is making expected progress toward Pulmonary Rehab goals after completing 21 sessions. Recommend continued exercise, life style modification, education, and utilization of breathing techniques to increase stamina and strength, while also decreasing shortness of breath with exertion.  Dr. Mechele Collin is Medical Director for Pulmonary Rehab at [facility].

## 2023-03-30 ENCOUNTER — Encounter (HOSPITAL_COMMUNITY)
Admission: RE | Admit: 2023-03-30 | Discharge: 2023-03-30 | Disposition: A | Discharge status: Home / Self Care | Payer: Medicare Other | Source: Ambulatory Visit | Attending: Pulmonary Disease | Admitting: Pulmonary Disease

## 2023-03-30 DIAGNOSIS — J984 Other disorders of lung: Secondary | ICD-10-CM | POA: Diagnosis not present

## 2023-03-30 NOTE — Progress Notes (Signed)
Daily Session Note  Patient Details  Name: [name] [name] MRN: [id number] Date of Birth: 03/01/1941 Referring Provider:   Doristine Devoid Pulmonary Rehab Walk Test from 12/28/2022 in [facility]  Referring Provider [name]       Encounter Date: 03/30/2023  Check In:  Session Check In - 03/30/23 1131       Check-In   Supervising physician immediately available to respond to emergencies [facility] MD immediately available    Physician(s) Joni Reining, NP    Location [facility]    Staff Present Elissa Lovett BS, ACSM-CEP, Exercise Physiologist;Mary Gerre Scull, RN, BSN;Casey Charlean Sanfilippo, MS, ACSM-CEP, Exercise Physiologist    Virtual Visit No    Medication changes reported     No    Fall or balance concerns reported    No    Tobacco Cessation No Change    Warm-up and Cool-down Performed as group-led instruction    Resistance Training Performed Yes    VAD Patient? No    PAD/SET Patient? No      Pain Assessment   Currently in Pain? No/denies    Pain Score 0-No pain    Multiple Pain Sites No             Capillary Blood Glucose: No results found for this or any previous visit (from the past 24 hour(s)).    Social History   Tobacco Use  Smoking Status Never  Smokeless Tobacco Never    Goals Met:  Proper associated with RPD/PD & O2 Sat Independence with exercise equipment Exercise tolerated well No report of concerns or symptoms today Strength training completed today  Goals Unmet:  Not Applicable  Comments: Service time is from 1015 to 1203.    Dr. Mechele Collin is Medical Director for Pulmonary Rehab at [facility].

## 2023-04-03 DIAGNOSIS — R1310 Dysphagia, unspecified: Secondary | ICD-10-CM | POA: Diagnosis not present

## 2023-04-03 DIAGNOSIS — R4789 Other speech disturbances: Secondary | ICD-10-CM | POA: Diagnosis not present

## 2023-04-03 DIAGNOSIS — Z85828 Personal history of other malignant neoplasm of skin: Secondary | ICD-10-CM | POA: Diagnosis not present

## 2023-04-03 DIAGNOSIS — R5383 Other fatigue: Secondary | ICD-10-CM | POA: Diagnosis not present

## 2023-04-03 DIAGNOSIS — G1220 Motor neuron disease, unspecified: Secondary | ICD-10-CM | POA: Diagnosis not present

## 2023-04-03 DIAGNOSIS — Z66 Do not resuscitate: Secondary | ICD-10-CM | POA: Diagnosis not present

## 2023-04-03 DIAGNOSIS — E43 Unspecified severe protein-calorie malnutrition: Secondary | ICD-10-CM | POA: Diagnosis not present

## 2023-04-03 DIAGNOSIS — Z9981 Dependence on supplemental oxygen: Secondary | ICD-10-CM | POA: Diagnosis not present

## 2023-04-03 DIAGNOSIS — E890 Postprocedural hypothyroidism: Secondary | ICD-10-CM | POA: Diagnosis not present

## 2023-04-03 DIAGNOSIS — R253 Fasciculation: Secondary | ICD-10-CM | POA: Diagnosis not present

## 2023-04-03 DIAGNOSIS — Z90710 Acquired absence of both cervix and uterus: Secondary | ICD-10-CM | POA: Diagnosis not present

## 2023-04-03 DIAGNOSIS — Z6820 Body mass index (BMI) 20.0-20.9, adult: Secondary | ICD-10-CM | POA: Diagnosis not present

## 2023-04-03 DIAGNOSIS — R630 Anorexia: Secondary | ICD-10-CM | POA: Diagnosis not present

## 2023-04-03 DIAGNOSIS — G709 Myoneural disorder, unspecified: Secondary | ICD-10-CM | POA: Diagnosis not present

## 2023-04-03 DIAGNOSIS — M810 Age-related osteoporosis without current pathological fracture: Secondary | ICD-10-CM | POA: Diagnosis not present

## 2023-04-03 DIAGNOSIS — G1221 Amyotrophic lateral sclerosis: Secondary | ICD-10-CM | POA: Diagnosis not present

## 2023-04-03 DIAGNOSIS — J9612 Chronic respiratory failure with hypercapnia: Secondary | ICD-10-CM | POA: Diagnosis not present

## 2023-04-03 DIAGNOSIS — Z79899 Other long term (current) drug therapy: Secondary | ICD-10-CM | POA: Diagnosis not present

## 2023-04-03 DIAGNOSIS — K219 Gastro-esophageal reflux disease without esophagitis: Secondary | ICD-10-CM | POA: Diagnosis not present

## 2023-04-03 DIAGNOSIS — R0609 Other forms of dyspnea: Secondary | ICD-10-CM | POA: Diagnosis not present

## 2023-04-03 DIAGNOSIS — M150 Primary generalized (osteo)arthritis: Secondary | ICD-10-CM | POA: Diagnosis not present

## 2023-04-03 DIAGNOSIS — M6281 Muscle weakness (generalized): Secondary | ICD-10-CM | POA: Diagnosis not present

## 2023-04-03 DIAGNOSIS — I10 Essential (primary) hypertension: Secondary | ICD-10-CM | POA: Diagnosis not present

## 2023-04-03 DIAGNOSIS — R5381 Other malaise: Secondary | ICD-10-CM | POA: Diagnosis not present

## 2023-04-03 DIAGNOSIS — M5412 Radiculopathy, cervical region: Secondary | ICD-10-CM | POA: Diagnosis not present

## 2023-04-03 DIAGNOSIS — R1313 Dysphagia, pharyngeal phase: Secondary | ICD-10-CM | POA: Diagnosis not present

## 2023-04-03 DIAGNOSIS — R2681 Unsteadiness on feet: Secondary | ICD-10-CM | POA: Diagnosis not present

## 2023-04-03 DIAGNOSIS — J99 Respiratory disorders in diseases classified elsewhere: Secondary | ICD-10-CM | POA: Diagnosis not present

## 2023-04-03 DIAGNOSIS — R0602 Shortness of breath: Secondary | ICD-10-CM | POA: Diagnosis not present

## 2023-04-03 DIAGNOSIS — Z515 Encounter for palliative care: Secondary | ICD-10-CM | POA: Diagnosis not present

## 2023-04-03 DIAGNOSIS — R400 Somnolence: Secondary | ICD-10-CM | POA: Diagnosis not present

## 2023-04-03 DIAGNOSIS — Z7951 Long term (current) use of inhaled steroids: Secondary | ICD-10-CM | POA: Diagnosis not present

## 2023-04-04 ENCOUNTER — Encounter (HOSPITAL_COMMUNITY): Payer: Medicare Other

## 2023-04-04 ENCOUNTER — Telehealth (HOSPITAL_COMMUNITY): Payer: Self-pay

## 2023-04-04 NOTE — Telephone Encounter (Signed)
Called [name]'s son, Gabriel Rung, to discuss recent ED visit. Left a message with a callback number.

## 2023-04-06 ENCOUNTER — Ambulatory Visit (HOSPITAL_COMMUNITY): Payer: Medicare Other

## 2023-04-11 ENCOUNTER — Ambulatory Visit (HOSPITAL_COMMUNITY): Payer: Medicare Other

## 2023-04-12 NOTE — Progress Notes (Signed)
Discharge Progress Report  Patient Details  Name: [name] [name] MRN: [id number] Date of Birth: May 05, 1941 Referring Provider:   Doristine Devoid Pulmonary Rehab Walk Test from 12/28/2022 in [facility]  Referring Provider McNeil  [Ellison]        Number of Visits: 22  Reason for Discharge:  Early Exit:  hospitilization  Smoking History:  Social History   Tobacco Use  Smoking Status Never  Smokeless Tobacco Never    Diagnosis:  Other disorders of lung  ADL UCSD:  Pulmonary Assessment Scores     Row Name 12/28/22 1058         ADL UCSD   ADL Phase Entry     SOB Score total 11       CAT Score   CAT Score 7       mMRC Score   mMRC Score 3              Initial Exercise Prescription:  Initial Exercise Prescription - 12/28/22 1200       Date of Initial Exercise RX and Referring Provider   Date 12/28/22    Referring Provider [name]   Expected Discharge Date 03/23/23      Recumbant Bike   Level 1    RPM 30    Minutes 15    METs 1.5      NuStep   Level 1    SPM 50    Minutes 15    METs 1.5      Prescription Details   Frequency (times per week) 2    Duration Progress to 30 minutes of continuous aerobic without signs/symptoms of physical distress      Intensity   THRR 40-80% of Max Heartrate 56-111    Ratings of Perceived Exertion 11-13    Perceived Dyspnea 0-4      Progression   Progression Continue to progress workloads to maintain intensity without signs/symptoms of physical distress.      Resistance Training   Training Prescription Yes    Weight red bands    Reps 10-15             Discharge Exercise Prescription (Final Exercise Prescription Changes):  Exercise Prescription Changes - 03/23/23 1225       Response to Exercise   Blood Pressure (Admit) 128/70    Blood Pressure (Exit) 112/68    Heart Rate (Admit) 87 bpm    Heart Rate (Exercise) 102 bpm    Heart  Rate (Exit) 94 bpm    Oxygen Saturation (Admit) 96 %    Oxygen Saturation (Exercise) 95 %    Oxygen Saturation (Exit) 95 %    Rating of Perceived Exertion (Exercise) 13    Perceived Dyspnea (Exercise) 1    Duration Continue with 30 min of aerobic exercise without signs/symptoms of physical distress.    Intensity THRR unchanged      Progression   Progression Continue to progress workloads to maintain intensity without signs/symptoms of physical distress.      Resistance Training   Training Prescription Yes    Weight red bands    Reps 10-15    Time 10 Minutes      Recumbant Bike   Level 4    Minutes 15    METs 2.7      NuStep   Level 3    Minutes 15    METs 1.6  Functional Capacity:  6 Minute Walk     Row Name 12/28/22 1203         6 Minute Walk   Phase Initial     Distance 610 feet     Walk Time 6 minutes     # of Rest Breaks 4  4 rest breaks at: 1:46-2:07, 2:49-3:05, 3:49-4:32, 5:18-5:42     MPH 1.16     METS 1.63     RPE 13     Perceived Dyspnea  3     VO2 Peak 5.69     Symptoms Yes (comment)     Comments dyspnea     Resting HR 89 bpm     Resting BP 122/76     Resting Oxygen Saturation  98 %     Exercise Oxygen Saturation  during 6 min walk 86 %     Max Ex. HR 141 bpm     Max Ex. BP 140/82     2 Minute Post BP 122/78       Interval HR   1 Minute HR 114     2 Minute HR 116     3 Minute HR 129     4 Minute HR 141     5 Minute HR 135     6 Minute HR 130     2 Minute Post HR 103     Interval Heart Rate? Yes       Interval Oxygen   Interval Oxygen? Yes     Baseline Oxygen Saturation % 98 %     1 Minute Oxygen Saturation % 86 %  at 1:46 O2 dropped to 86%, PLB right up to 91%     1 Minute Liters of Oxygen 0 L     2 Minute Oxygen Saturation % 95 %     2 Minute Liters of Oxygen 0 L     3 Minute Oxygen Saturation % 86 %  2:49 O2 dropped to 86%, PLB up to 91%     3 Minute Liters of Oxygen 0 L     4 Minute Oxygen Saturation % 94 %     4  Minute Liters of Oxygen 0 L     5 Minute Oxygen Saturation % 92 %     5 Minute Liters of Oxygen 0 L     6 Minute Oxygen Saturation % 90 %     6 Minute Liters of Oxygen 0 L     2 Minute Post Oxygen Saturation % 94 %     2 Minute Post Liters of Oxygen 0 L              Psychological, QOL, Others - Outcomes: PHQ 2/9:    12/28/2022   10:53 AM 11/28/2022   10:30 AM 07/20/2022   10:18 AM 07/14/2022   10:57 AM 12/29/2021   11:13 AM  Depression screen PHQ 2/9  Decreased Interest 0 0 0 0 0  Down, Depressed, Hopeless 0 0 0 0 0  PHQ - 2 Score 0 0 0 0 0  Altered sleeping 1  0 0 0  Tired, decreased energy 1  2 1 1   Change in appetite 1  1 0 0  Feeling bad or failure about yourself  0  0 0 0  Trouble concentrating 0  0 0 0  Moving slowly or fidgety/restless 0  0 0 0  Suicidal thoughts 0  0 0 0  PHQ-9 Score 3  3 1  1  Difficult doing work/chores Not difficult at all  Not difficult at all Somewhat difficult Not difficult at all    Quality of Life:   Personal Goals: Goals established at orientation with interventions provided to work toward goal.  Personal Goals and Risk Factors at Admission - 12/28/22 1056       Core Components/Risk Factors/Patient Goals on Admission    Weight Management Weight Gain;Yes    Intervention Weight Management: Develop a combined nutrition and exercise program designed to reach desired caloric intake, while maintaining appropriate intake of nutrient and fiber, sodium and fats, and appropriate energy expenditure required for the weight goal.;Weight Management: Provide education and appropriate resources to help participant work on and attain dietary goals.    Expected Outcomes Short Term: Continue to assess and modify interventions until short term weight is achieved;Long Term: Adherence to nutrition and physical activity/exercise program aimed toward attainment of established weight goal;Weight Maintenance: Understanding of the daily nutrition guidelines, which  includes 25-35% calories from fat, 7% or less cal from saturated fats, less than 200mg  cholesterol, less than 1.5gm of sodium, & 5 or more servings of fruits and vegetables daily;Weight Gain: Understanding of general recommendations for a high calorie, high protein meal plan that promotes weight gain by distributing calorie intake throughout the day with the consumption for 4-5 meals, snacks, and/or supplements;Understanding of distribution of calorie intake throughout the day with the consumption of 4-5 meals/snacks;Understanding recommendations for meals to include 15-35% energy as protein, 25-35% energy from fat, 35-60% energy from carbohydrates, less than 200mg  of dietary cholesterol, 20-35 gm of total fiber daily    Improve shortness of breath with ADL's Yes    Intervention Provide education, individualized exercise plan and daily activity instruction to help decrease symptoms of SOB with activities of daily living.    Expected Outcomes Short Term: Improve cardiorespiratory fitness to achieve a reduction of symptoms when performing ADLs;Long Term: Be able to perform more ADLs without symptoms or delay the onset of symptoms              Personal Goals Discharge:  Goals and Risk Factor Review     Row Name 01/02/23 0936 01/27/23 1045 02/22/23 0935 04/12/23 0949       Core Components/Risk Factors/Patient Goals Review   Personal Goals Review Weight Management/Obesity;Improve shortness of breath with ADL's;Develop more efficient breathing techniques such as purse lipped breathing and diaphragmatic breathing and practicing self-pacing with activity. Weight Management/Obesity;Improve shortness of breath with ADL's;Develop more efficient breathing techniques such as purse lipped breathing and diaphragmatic breathing and practicing self-pacing with activity. Weight Management/Obesity;Improve shortness of breath with ADL's Weight Management/Obesity;Improve shortness of breath with ADL's    Review [name] [name]  is scheduled to start the program on 7/9. We will monitor her progress throughout the program. Goal progressing for weight gain. [name] [name] is working with staff dietician to achieve weight gain goals. Goal progressing for improving shortness of breath with ADL's. Goal met on developing more efficient breathing techniques such as purse lipped breathing and diaphragmatic breathing; and practicing self-pacing with activity. We will continue to monitor [name]'s progress throughout the program. [name] [name] had initially wanted to gain weight prior to starting the program, but based on discussion with the staff dietician she just wants to maintain her weight at this time. Goal progressing on maintaining weight. Goal progressing for improving shortness of breath with ADL's. We will continue to monitor [name]'s progress throughout the program. [name] [name] was discharged from the program due to a recent hospitilization. She did  not meet her goal of maintaining her weight. She is down 12#'s since starting the program. She did not meet her goal on improving shortness of breath with ADL's.    Expected Outcomes See admission goals See admission goals See admission goals See admission goals             Exercise Goals and Review:  Exercise Goals     Row Name 12/28/22 1103 01/20/23 1320 02/22/23 0924         Exercise Goals   Increase Physical Activity Yes Yes Yes     Intervention Provide advice, education, support and counseling about physical activity/exercise needs.;Develop an individualized exercise prescription for aerobic and resistive training based on initial evaluation findings, risk stratification, comorbidities and participant's personal goals. Provide advice, education, support and counseling about physical activity/exercise needs.;Develop an individualized exercise prescription for aerobic and resistive training based on initial evaluation findings, risk stratification, comorbidities and participant's personal goals.  Provide advice, education, support and counseling about physical activity/exercise needs.;Develop an individualized exercise prescription for aerobic and resistive training based on initial evaluation findings, risk stratification, comorbidities and participant's personal goals.     Expected Outcomes Short Term: Attend rehab on a regular basis to increase amount of physical activity.;Long Term: Exercising regularly at least 3-5 days a week.;Long Term: Add in home exercise to make exercise part of routine and to increase amount of physical activity. Short Term: Attend rehab on a regular basis to increase amount of physical activity.;Long Term: Exercising regularly at least 3-5 days a week.;Long Term: Add in home exercise to make exercise part of routine and to increase amount of physical activity. Short Term: Attend rehab on a regular basis to increase amount of physical activity.;Long Term: Exercising regularly at least 3-5 days a week.;Long Term: Add in home exercise to make exercise part of routine and to increase amount of physical activity.     Increase Strength and Stamina Yes Yes Yes     Intervention Provide advice, education, support and counseling about physical activity/exercise needs.;Develop an individualized exercise prescription for aerobic and resistive training based on initial evaluation findings, risk stratification, comorbidities and participant's personal goals. Provide advice, education, support and counseling about physical activity/exercise needs.;Develop an individualized exercise prescription for aerobic and resistive training based on initial evaluation findings, risk stratification, comorbidities and participant's personal goals. Provide advice, education, support and counseling about physical activity/exercise needs.;Develop an individualized exercise prescription for aerobic and resistive training based on initial evaluation findings, risk stratification, comorbidities and  participant's personal goals.     Expected Outcomes Short Term: Increase workloads from initial exercise prescription for resistance, speed, and METs.;Short Term: Perform resistance training exercises routinely during rehab and add in resistance training at home;Long Term: Improve cardiorespiratory fitness, muscular endurance and strength as measured by increased METs and functional capacity ( ) Short Term: Increase workloads from initial exercise prescription for resistance, speed, and METs.;Short Term: Perform resistance training exercises routinely during rehab and add in resistance training at home;Long Term: Improve cardiorespiratory fitness, muscular endurance and strength as measured by increased METs and functional capacity ( ) Short Term: Increase workloads from initial exercise prescription for resistance, speed, and METs.;Short Term: Perform resistance training exercises routinely during rehab and add in resistance training at home;Long Term: Improve cardiorespiratory fitness, muscular endurance and strength as measured by increased METs and functional capacity ( )     Able to understand and use rate of perceived exertion (RPE) scale Yes Yes Yes     Intervention Provide education and  explanation on how to use RPE scale Provide education and explanation on how to use RPE scale Provide education and explanation on how to use RPE scale     Expected Outcomes Short Term: Able to use RPE daily in rehab to express subjective intensity level;Long Term:  Able to use RPE to guide intensity level when exercising independently Short Term: Able to use RPE daily in rehab to express subjective intensity level;Long Term:  Able to use RPE to guide intensity level when exercising independently Short Term: Able to use RPE daily in rehab to express subjective intensity level;Long Term:  Able to use RPE to guide intensity level when exercising independently     Able to understand and use Dyspnea scale Yes Yes  Yes     Intervention Provide education and explanation on how to use Dyspnea scale Provide education and explanation on how to use Dyspnea scale Provide education and explanation on how to use Dyspnea scale     Expected Outcomes Short Term: Able to use Dyspnea scale daily in rehab to express subjective sense of shortness of breath during exertion;Long Term: Able to use Dyspnea scale to guide intensity level when exercising independently Short Term: Able to use Dyspnea scale daily in rehab to express subjective sense of shortness of breath during exertion;Long Term: Able to use Dyspnea scale to guide intensity level when exercising independently Short Term: Able to use Dyspnea scale daily in rehab to express subjective sense of shortness of breath during exertion;Long Term: Able to use Dyspnea scale to guide intensity level when exercising independently     Knowledge and understanding of Target Heart Rate Range (THRR) Yes Yes Yes     Intervention Provide education and explanation of THRR including how the numbers were predicted and where they are located for reference Provide education and explanation of THRR including how the numbers were predicted and where they are located for reference Provide education and explanation of THRR including how the numbers were predicted and where they are located for reference     Expected Outcomes Short Term: Able to state/look up THRR;Long Term: Able to use THRR to govern intensity when exercising independently;Short Term: Able to use daily as guideline for intensity in rehab Short Term: Able to state/look up THRR;Long Term: Able to use THRR to govern intensity when exercising independently;Short Term: Able to use daily as guideline for intensity in rehab Short Term: Able to state/look up THRR;Long Term: Able to use THRR to govern intensity when exercising independently;Short Term: Able to use daily as guideline for intensity in rehab     Understanding of Exercise  Prescription Yes Yes Yes     Intervention Provide education, explanation, and written materials on patient's individual exercise prescription Provide education, explanation, and written materials on patient's individual exercise prescription Provide education, explanation, and written materials on patient's individual exercise prescription     Expected Outcomes Short Term: Able to explain program exercise prescription;Long Term: Able to explain home exercise prescription to exercise independently Short Term: Able to explain program exercise prescription;Long Term: Able to explain home exercise prescription to exercise independently Short Term: Able to explain program exercise prescription;Long Term: Able to explain home exercise prescription to exercise independently              Exercise Goals Re-Evaluation:  Exercise Goals Re-Evaluation     Row Name 12/30/22 1025 01/20/23 1320 02/22/23 0924 03/22/23 0855 04/12/23 0909     Exercise Goal Re-Evaluation   Exercise Goals Review Increase Physical  Activity;Able to understand and use Dyspnea scale;Understanding of Exercise Prescription;Increase Strength and Stamina;Knowledge and understanding of Target Heart Rate Range (THRR);Able to understand and use rate of perceived exertion (RPE) scale Increase Physical Activity;Able to understand and use Dyspnea scale;Understanding of Exercise Prescription;Increase Strength and Stamina;Knowledge and understanding of Target Heart Rate Range (THRR);Able to understand and use rate of perceived exertion (RPE) scale Increase Physical Activity;Able to understand and use Dyspnea scale;Understanding of Exercise Prescription;Increase Strength and Stamina;Knowledge and understanding of Target Heart Rate Range (THRR);Able to understand and use rate of perceived exertion (RPE) scale Increase Physical Activity;Able to understand and use Dyspnea scale;Understanding of Exercise Prescription;Increase Strength and Stamina;Knowledge  and understanding of Target Heart Rate Range (THRR);Able to understand and use rate of perceived exertion (RPE) scale Increase Physical Activity;Able to understand and use Dyspnea scale;Understanding of Exercise Prescription;Increase Strength and Stamina;Knowledge and understanding of Target Heart Rate Range (THRR);Able to understand and use rate of perceived exertion (RPE) scale   Comments [name] [name] is scheduled to begin exercsise not week. Will continue to monitor and progress as able. [name] [name] has completed 6 exercise sessions. She exercises for 15 min on the recumbent bike and Nustep. [name] averages 2.4 METs at level 3 on the recumbent bike and 1.8 METs at level 2 on the Nustep. [name] [name] performs the warmup and cooldown standing without limitations. She has increased her workload for both exercises modes as she tolerates progressions well. METs have increased for both exercise modes. [name] [name] is starting to understand her ExRx more. She seems motivated to exercise and increase her functional capacity. She would get short of breath when she started rehab. Her shortness of breath has improved with exercise. Will continue to monitor and progress as able. [name] [name] has completed 14 exercise sessions. She exercises for 15 min on the recumbent bike and Nustep. [name] averages 2.3 METs at level 3 on the recumbent bike and 1.8 METs at level 3 on the Nustep. [name] [name] performs the warmup and cooldown standing without limitations. She has increased her workload for the Nustep as METs have remained the same. Staff have encouraged [name] to walk the track using a rollator. [name] [name] has walked the track a couple of sessions but not consistently. She prefers to use the recumbent bike over walking the track. Will continue to monitor and progress as able. [name] [name] has completed 20 exercise sessions. She exercises for 15 min on the recumbent bike and Nustep. [name] averages 2.6 METs at level 3 on the recumbent bike and 1.6 METs at level 3 on the Nustep.  [name] [name] performs the warmup and cooldown standing without limitations. She has increased her level and METs on the recumbent bike but not the Nustep. [name] [name] still prefers to not walk the track despite encouragement. We have recently discussed home exercise as [name] exercises outside of rehab. She exercises with her son. [name]'s son is very encouraging of her exercise. Will continue to monitor and progress as able. [name] completed 22 exercise sessions. Her peak METs were 2.7 on the recumbent bike and 1.8 on the Nustep. She is being discharged early due to a hospitalization.   Expected Outcomes Through exercise at rehab and home, the patient will decrease shortness of breath with daily activities and feel confident in carrying out an exercise regimen at home. Through exercise at rehab and home, the patient will decrease shortness of breath with daily activities and feel confident in carrying out an exercise regimen at home. Through exercise at rehab and home, the patient will decrease shortness of breath  with daily activities and feel confident in carrying out an exercise regimen at home. Through exercise at rehab and home, the patient will decrease shortness of breath with daily activities and feel confident in carrying out an exercise regimen at home. Through exercise at rehab and home, the patient will decrease shortness of breath with daily activities and feel confident in carrying out an exercise regimen at home.            Nutrition & Weight - Outcomes:  Pre Biometrics - 12/28/22 1209       Pre Biometrics   Grip Strength 12 kg              Nutrition:  Nutrition Therapy & Goals - 04/04/23 1057       Nutrition Therapy   Diet General Healthy Diet    Drug/Food Interactions Statins/Certain Fruits      Personal Nutrition Goals   Nutrition Goal Patient to identify strategies for weight maintenance/weight gain as needed throughout Pulmonary Rehab.   goal not met.   Comments Goal not met.  [name] [name] reports history of unwanted weight loss over the last two year; per documentation, she was 150# July of 2023. Unwanted weight loss has been worked up through GI with normal findings. She did have a swallow study done and was diagnosed with dysphagia. She continues follow-up with neurology, pulmonology for possible ALS. At this point, she does not want to gain weight but would like to maintain her current weight. She does not cook. She reports prioriziting protein foods such as eggs, protein bars, Ensure and then typically eats protein/vegetable based meal at dinner meal. [name] [name] is down ~12# since starting with our program; reviewed strategies for weight gain/weight maintenance including nutrition supplements, increasing eating frequency, increasing calories from fat/carbohydrates/protein, etc. [name] [name] will benefit from from participation in pulmonary rehab for nutrition, exercise, and lifestyle modification.      Intervention Plan   Intervention Prescribe, educate and counsel regarding individualized specific dietary modifications aiming towards targeted core components such as weight, hypertension, lipid management, diabetes, heart failure and other comorbidities.;Nutrition handout(s) given to patient.    Expected Outcomes Short Term Goal: Understand basic principles of dietary content, such as calories, fat, sodium, cholesterol and nutrients.;Long Term Goal: Adherence to prescribed nutrition plan.             Nutrition Discharge:  Nutrition Assessments - 01/03/23 1111       Rate Your Plate Scores   Pre Score 63             Education Questionnaire Score:   Goals reviewed with patient; copy given to patient.

## 2023-04-13 ENCOUNTER — Encounter: Payer: Self-pay | Admitting: Internal Medicine

## 2023-04-13 ENCOUNTER — Ambulatory Visit (HOSPITAL_COMMUNITY): Payer: Medicare Other

## 2023-04-14 MED ORDER — PANTOPRAZOLE SODIUM 20 MG PO TBEC: 20.0000 mg | DELAYED_RELEASE_TABLET | Freq: Every day | ORAL | 0 refills | Status: DC

## 2023-04-14 NOTE — Telephone Encounter (Signed)
Rx Protonix 20 mg sent.

## 2023-04-14 NOTE — Telephone Encounter (Signed)
Sorry your mom  is having such difficulty .   Ok to send in rx protonix 20 mg per day disp 90 ( can she swallow  this?)   trazodone optimally should be  refilled by original  prescriber team to avoid confusion and medication error .  So please contact [facility] team first   about this refill  ( dosing and effectiveness and  minimize side effects)  .  Let us know if that is not working out .

## 2023-04-14 NOTE — Telephone Encounter (Signed)
Attempted to reach pt. Left a detail message and to call us back if have any questions.

## 2023-04-27 DIAGNOSIS — R1312 Dysphagia, oropharyngeal phase: Secondary | ICD-10-CM | POA: Diagnosis not present

## 2023-04-27 DIAGNOSIS — R498 Other voice and resonance disorders: Secondary | ICD-10-CM | POA: Diagnosis not present

## 2023-04-27 DIAGNOSIS — Z7951 Long term (current) use of inhaled steroids: Secondary | ICD-10-CM | POA: Diagnosis not present

## 2023-04-27 DIAGNOSIS — Z681 Body mass index (BMI) 19 or less, adult: Secondary | ICD-10-CM | POA: Diagnosis not present

## 2023-04-27 DIAGNOSIS — E46 Unspecified protein-calorie malnutrition: Secondary | ICD-10-CM | POA: Diagnosis not present

## 2023-04-27 DIAGNOSIS — E039 Hypothyroidism, unspecified: Secondary | ICD-10-CM | POA: Diagnosis not present

## 2023-04-27 DIAGNOSIS — Z9989 Dependence on other enabling machines and devices: Secondary | ICD-10-CM | POA: Diagnosis not present

## 2023-04-27 DIAGNOSIS — R471 Dysarthria and anarthria: Secondary | ICD-10-CM | POA: Diagnosis not present

## 2023-04-27 DIAGNOSIS — M6281 Muscle weakness (generalized): Secondary | ICD-10-CM | POA: Diagnosis not present

## 2023-04-27 DIAGNOSIS — G1221 Amyotrophic lateral sclerosis: Secondary | ICD-10-CM | POA: Diagnosis not present

## 2023-04-27 DIAGNOSIS — M810 Age-related osteoporosis without current pathological fracture: Secondary | ICD-10-CM | POA: Diagnosis not present

## 2023-04-28 ENCOUNTER — Other Ambulatory Visit: Payer: Self-pay | Admitting: Family

## 2023-04-28 ENCOUNTER — Encounter (HOSPITAL_COMMUNITY): Payer: Self-pay | Admitting: Internal Medicine

## 2023-05-01 NOTE — Progress Notes (Unsigned)
No chief complaint on file.   HPI: [name] [name] 82 y.o. come in for Chronic disease management   See past notes   final dx has been ALS seen at [facility]  ROS: See pertinent positives and negatives per HPI.  Past Medical History:  Diagnosis Date   COVID 10/25/2020   Dog bite of thigh 04/09/2012   Elevated lipids    GERD (gastroesophageal reflux disease)    Hypertension    Hypothyroidism    post thyroidectomy   Osteoarthritis    Osteopenia    t -2.5 hip in 2005 by Dexa   Osteoporosis    commpr fx after dog related fall  injury   Seasonal rhinitis    spring and fall   Skin cancer    squamous cell ca chest   Vaginal prolapse    hs corrected    Family History  Problem Relation Age of Onset   Heart attack Mother    Alcohol abuse Mother    Kidney cancer Father    Hypertension Maternal Grandmother    Esophageal cancer Neg Hx    Colon cancer Neg Hx    Pancreatic cancer Neg Hx    Stomach cancer Neg Hx     Social History   Socioeconomic History   Marital status: Widowed    Spouse name: Not on file   Number of children: 1   Years of education: Not on file   Highest education level: Associate degree: academic program  Occupational History   Occupation: retired  Tobacco Use   Smoking status: Never   Smokeless tobacco: Never  Vaping Use   Vaping status: Never Used  Substance and Sexual Activity   Alcohol use: Yes    Comment: occasionally   Drug use: No   Sexual activity: Not Currently    Partners: Male    Birth control/protection: Surgical    Comment: hysterectomy, older than 16, less than 5  Other Topics Concern   Not on file  Social History Narrative   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   Regular Exercise- yes      1 son, never smoker no alcohol 1 caffeinated beverage   Social Determinants of Health   Financial Resource Strain: Low Risk  (10/29/2022)   Received from [facility], [facility]   Overall Financial Resource Strain (CARDIA)    Difficulty of Paying Living Expenses: Not hard at all  Food Insecurity: No Food Insecurity (03/01/2023)   Received from [facility]   Hunger Vital Sign    Worried About Running Out of Food in the Last Year: Never true    Ran Out of Food in the Last Year: Never true  Transportation Needs: No Transportation Needs (03/01/2023)   Received from [facility] - Transportation    In the past 12 months, has lack of transportation kept you from medical appointments or from getting medications?: No    Lack of Transportation (Non-Medical): No  Physical Activity: Insufficiently Active (05/24/2022)   Exercise Vital Sign    Days of Exercise per Week: 5 days    Minutes of Exercise per Session: 20 min  Stress: No Stress Concern Present (05/24/2022)   [employer] of Occupational Health - Occupational Stress Questionnaire    Feeling of Stress : Not at all  Social Connections: Unknown (03/25/2023)   Received from [facility]   Social Network  Social Network: Not on file    Outpatient Medications Prior to Visit  Medication Sig Dispense Refill   albuterol (VENTOLIN HFA) 108 (90 Base) MCG/ACT inhaler SMARTSIG:2 Inhalation Via Inhaler Every 6 Hours PRN     amLODipine (NORVASC) 5 MG tablet Take 1 tablet (5 mg total) by mouth daily. 90 tablet 3   aspirin 325 MG EC tablet Take 325 mg by mouth every 6 (six) hours as needed for pain.     b complex vitamins tablet Take 1 tablet by mouth daily.     budesonide-formoterol (SYMBICORT) 80-4.5 MCG/ACT inhaler INHALE 2 PUFFS INTO THE LUNGS TWICE DAILY 10.2 g 1   budesonide-formoterol (SYMBICORT) 80-4.5 MCG/ACT inhaler INHALE 2 PUFFS INTO THE LUNGS TWICE DAILY 10.2 g 1   CALCIUM PO Take 1,200 mg by mouth daily.      denosumab (PROLIA) 60 MG/ML SOLN injection Inject 60 mg into the skin every 6 (six) months. Administer in upper arm, thigh, or abdomen      dextromethorphan-guaiFENesin (MUCINEX DM) 30-600 MG 12hr tablet Take 1 tablet by mouth 2 (two) times daily as needed for cough. (Patient not taking: Reported on 02/08/2023) 12 tablet 3   fexofenadine (ALLEGRA) 60 MG tablet Take 1 tablet (60 mg total) by mouth daily for 15 days. 15 tablet 0   fluticasone (FLONASE) 50 MCG/ACT nasal spray Place 2 sprays into both nostrils daily. 18.2 g 0   furosemide (LASIX) 20 MG tablet Take 1 tablet (20 mg total) by mouth daily as needed (leg swelling). 30 tablet 3   levothyroxine (SYNTHROID) 100 MCG tablet TAKE 1 TABLET(100 MCG) BY MOUTH DAILY 90 tablet 2   metoprolol tartrate (LOPRESSOR) 100 MG tablet Take 1 tablet (100 mg total) by mouth once for 1 dose. Take 2 hours prior to procedure. 1 tablet 0   pantoprazole (PROTONIX) 20 MG tablet Take 1 tablet (20 mg total) by mouth daily. 90 tablet 0   pantoprazole (PROTONIX) 40 MG tablet Take 1 tablet (40 mg total) by mouth in the morning. (Patient taking differently: Take 20 mg by mouth in the morning.) 30 tablet 0   No facility-administered medications prior to visit.     EXAM:  There were no vitals taken for this visit.  There is no height or weight on file to calculate BMI.  GENERAL: vitals reviewed and listed above, alert, oriented, appears well hydrated and in no acute distress HEENT: atraumatic, conjunctiva  clear, no obvious abnormalities on inspection of external nose and ears OP : no lesion edema or exudate  NECK: no obvious masses on inspection palpation  LUNGS: clear to auscultation bilaterally, no wheezes, rales or rhonchi, good air movement CV: HRRR, no clubbing cyanosis or  peripheral edema nl cap refill  MS: moves all extremities without noticeable focal  abnormality PSYCH: pleasant and cooperative, no obvious depression or anxiety Lab Results  Component Value Date   WBC 5.1 07/20/2022   HGB 13.3 07/20/2022   HCT 38.9 07/20/2022   PLT 294.0 07/20/2022   GLUCOSE 83 02/08/2023   CHOL 230  (H) 07/20/2022   TRIG 121.0 07/20/2022   HDL 68.10 07/20/2022   LDLDIRECT 134.1 12/27/2006   LDLCALC 138 (H) 07/20/2022   ALT 21 07/20/2022   AST 28 07/20/2022   NA 143 02/08/2023   K 4.2 02/08/2023   CL 102 02/08/2023   CREATININE 0.43 (L) 02/08/2023   BUN 12 02/08/2023   CO2 25 02/08/2023   TSH 3.19 07/20/2022   INR 1.0 07/25/2007   HGBA1C  5.8 09/23/2020   BP Readings from Last 3 Encounters:  02/20/23 133/77  02/08/23 128/64  01/04/23 136/70    ASSESSMENT AND PLAN:  Discussed the following assessment and plan:  No diagnosis found.  -Patient advised to return or notify health care team  if  new concerns arise.  There are no Patient Instructions on file for this visit.   Neta Mends. Andreana Klingerman M.D.

## 2023-05-02 ENCOUNTER — Ambulatory Visit (INDEPENDENT_AMBULATORY_CARE_PROVIDER_SITE_OTHER): Payer: Medicare Other | Admitting: Internal Medicine

## 2023-05-02 ENCOUNTER — Encounter: Payer: Self-pay | Admitting: Internal Medicine

## 2023-05-02 VITALS — BP 104/66

## 2023-05-02 DIAGNOSIS — Z79899 Other long term (current) drug therapy: Secondary | ICD-10-CM | POA: Diagnosis not present

## 2023-05-02 DIAGNOSIS — E039 Hypothyroidism, unspecified: Secondary | ICD-10-CM | POA: Diagnosis not present

## 2023-05-02 DIAGNOSIS — Z23 Encounter for immunization: Secondary | ICD-10-CM | POA: Diagnosis not present

## 2023-05-02 DIAGNOSIS — M810 Age-related osteoporosis without current pathological fracture: Secondary | ICD-10-CM

## 2023-05-02 DIAGNOSIS — G1221 Amyotrophic lateral sclerosis: Secondary | ICD-10-CM | POA: Diagnosis not present

## 2023-05-02 MED ORDER — LEVOTHYROXINE SODIUM 100 MCG PO TABS: ORAL_TABLET | ORAL | 3 refills | Status: DC

## 2023-05-02 NOTE — Patient Instructions (Addendum)
Good to see you today .  Flu shot today . Let us know  if we can help before next check  Refilled thyroid  med today

## 2023-05-03 ENCOUNTER — Telehealth: Payer: Self-pay | Admitting: Internal Medicine

## 2023-05-03 DIAGNOSIS — E46 Unspecified protein-calorie malnutrition: Secondary | ICD-10-CM | POA: Diagnosis not present

## 2023-05-03 DIAGNOSIS — G1221 Amyotrophic lateral sclerosis: Secondary | ICD-10-CM | POA: Diagnosis not present

## 2023-05-03 DIAGNOSIS — R1312 Dysphagia, oropharyngeal phase: Secondary | ICD-10-CM | POA: Diagnosis not present

## 2023-05-03 DIAGNOSIS — M6281 Muscle weakness (generalized): Secondary | ICD-10-CM | POA: Diagnosis not present

## 2023-05-03 DIAGNOSIS — R471 Dysarthria and anarthria: Secondary | ICD-10-CM | POA: Diagnosis not present

## 2023-05-03 DIAGNOSIS — R498 Other voice and resonance disorders: Secondary | ICD-10-CM | POA: Diagnosis not present

## 2023-05-03 NOTE — Telephone Encounter (Signed)
PT eval completed, requesting PT 1x8. Can leave message on VM

## 2023-05-05 ENCOUNTER — Telehealth: Payer: Self-pay | Admitting: Internal Medicine

## 2023-05-05 DIAGNOSIS — M6281 Muscle weakness (generalized): Secondary | ICD-10-CM | POA: Diagnosis not present

## 2023-05-05 DIAGNOSIS — R471 Dysarthria and anarthria: Secondary | ICD-10-CM | POA: Diagnosis not present

## 2023-05-05 DIAGNOSIS — E46 Unspecified protein-calorie malnutrition: Secondary | ICD-10-CM | POA: Diagnosis not present

## 2023-05-05 DIAGNOSIS — R498 Other voice and resonance disorders: Secondary | ICD-10-CM | POA: Diagnosis not present

## 2023-05-05 DIAGNOSIS — G1221 Amyotrophic lateral sclerosis: Secondary | ICD-10-CM | POA: Diagnosis not present

## 2023-05-05 DIAGNOSIS — R1312 Dysphagia, oropharyngeal phase: Secondary | ICD-10-CM | POA: Diagnosis not present

## 2023-05-05 NOTE — Telephone Encounter (Signed)
Requesting a face to face from provider, an OV or documentation explaining why patient has a need for home health fax to [phone]

## 2023-05-05 NOTE — Telephone Encounter (Signed)
Move home health speech therapy evaluation to the week of November 11

## 2023-05-05 NOTE — Telephone Encounter (Signed)
[facility] needs VO for OT 1x7

## 2023-05-08 NOTE — Telephone Encounter (Signed)
Attempted to reach [place]. Left a voicemail to call us back.

## 2023-05-09 ENCOUNTER — Ambulatory Visit: Payer: Medicare Other | Admitting: Obstetrics and Gynecology

## 2023-05-09 DIAGNOSIS — G1221 Amyotrophic lateral sclerosis: Secondary | ICD-10-CM | POA: Diagnosis not present

## 2023-05-09 DIAGNOSIS — R471 Dysarthria and anarthria: Secondary | ICD-10-CM | POA: Diagnosis not present

## 2023-05-09 DIAGNOSIS — R1312 Dysphagia, oropharyngeal phase: Secondary | ICD-10-CM | POA: Diagnosis not present

## 2023-05-09 DIAGNOSIS — R498 Other voice and resonance disorders: Secondary | ICD-10-CM | POA: Diagnosis not present

## 2023-05-09 DIAGNOSIS — M6281 Muscle weakness (generalized): Secondary | ICD-10-CM | POA: Diagnosis not present

## 2023-05-09 DIAGNOSIS — E46 Unspecified protein-calorie malnutrition: Secondary | ICD-10-CM | POA: Diagnosis not present

## 2023-05-10 ENCOUNTER — Encounter: Payer: Self-pay | Admitting: Internal Medicine

## 2023-05-10 NOTE — Telephone Encounter (Signed)
Printed this note and office note from 05/02/2023. Fax the document to fax number that is provided below. Received a confirmation.

## 2023-05-10 NOTE — Telephone Encounter (Signed)
See previous note

## 2023-05-10 NOTE — Telephone Encounter (Signed)
Spoke to [name] [name]. Inform her of provider's update. Verbalized understanding.

## 2023-05-10 NOTE — Telephone Encounter (Signed)
I saw her last week  in office 11 05  She has dx of als and  swallowing  speech dysfunction.   And assessment Not driving. Needs speech therapy and  help to  avoid further weight loss.  See last  visit note  although I didn't  address home health. In the  note

## 2023-05-10 NOTE — Telephone Encounter (Signed)
Spoke to [place]. She reports they had talk to pt's son, son wanted to move it to next week. They will have speech therapy move to next week.   Forwarding to provider for FYI.

## 2023-05-11 DIAGNOSIS — R1312 Dysphagia, oropharyngeal phase: Secondary | ICD-10-CM | POA: Diagnosis not present

## 2023-05-11 DIAGNOSIS — M6281 Muscle weakness (generalized): Secondary | ICD-10-CM | POA: Diagnosis not present

## 2023-05-11 DIAGNOSIS — R471 Dysarthria and anarthria: Secondary | ICD-10-CM | POA: Diagnosis not present

## 2023-05-11 DIAGNOSIS — E46 Unspecified protein-calorie malnutrition: Secondary | ICD-10-CM | POA: Diagnosis not present

## 2023-05-11 DIAGNOSIS — R498 Other voice and resonance disorders: Secondary | ICD-10-CM | POA: Diagnosis not present

## 2023-05-11 DIAGNOSIS — G1221 Amyotrophic lateral sclerosis: Secondary | ICD-10-CM | POA: Diagnosis not present

## 2023-05-12 DIAGNOSIS — R498 Other voice and resonance disorders: Secondary | ICD-10-CM | POA: Diagnosis not present

## 2023-05-12 DIAGNOSIS — R471 Dysarthria and anarthria: Secondary | ICD-10-CM | POA: Diagnosis not present

## 2023-05-12 DIAGNOSIS — R1312 Dysphagia, oropharyngeal phase: Secondary | ICD-10-CM | POA: Diagnosis not present

## 2023-05-12 DIAGNOSIS — E46 Unspecified protein-calorie malnutrition: Secondary | ICD-10-CM | POA: Diagnosis not present

## 2023-05-12 DIAGNOSIS — M6281 Muscle weakness (generalized): Secondary | ICD-10-CM | POA: Diagnosis not present

## 2023-05-12 DIAGNOSIS — G1221 Amyotrophic lateral sclerosis: Secondary | ICD-10-CM | POA: Diagnosis not present

## 2023-05-15 ENCOUNTER — Telehealth: Payer: Self-pay | Admitting: Internal Medicine

## 2023-05-15 NOTE — Telephone Encounter (Signed)
Yes- please order as requested

## 2023-05-15 NOTE — Telephone Encounter (Signed)
Cheryl ST with [facility] is calling and needs VO for ST 1x6 starting 11-18

## 2023-05-15 NOTE — Telephone Encounter (Signed)
No guarantee that lasix will be the answer but could try for 5 days to see if helps.  Have her weigh every morning  if hter is a large increase in one day 3-5 #  with swelling   that could be fluid retention but not define a reason why.  Can send in   lasix  20 mg  1 po every day for 5 days.  Or as directed  disp 20 . If we stay on this med then  will need to check potassium  kidney chemistry level ( bmp) .

## 2023-05-16 DIAGNOSIS — E46 Unspecified protein-calorie malnutrition: Secondary | ICD-10-CM | POA: Diagnosis not present

## 2023-05-16 DIAGNOSIS — R498 Other voice and resonance disorders: Secondary | ICD-10-CM | POA: Diagnosis not present

## 2023-05-16 DIAGNOSIS — G1221 Amyotrophic lateral sclerosis: Secondary | ICD-10-CM | POA: Diagnosis not present

## 2023-05-16 DIAGNOSIS — R1312 Dysphagia, oropharyngeal phase: Secondary | ICD-10-CM | POA: Diagnosis not present

## 2023-05-16 DIAGNOSIS — R471 Dysarthria and anarthria: Secondary | ICD-10-CM | POA: Diagnosis not present

## 2023-05-16 DIAGNOSIS — M6281 Muscle weakness (generalized): Secondary | ICD-10-CM | POA: Diagnosis not present

## 2023-05-17 DIAGNOSIS — E46 Unspecified protein-calorie malnutrition: Secondary | ICD-10-CM | POA: Diagnosis not present

## 2023-05-17 DIAGNOSIS — R498 Other voice and resonance disorders: Secondary | ICD-10-CM | POA: Diagnosis not present

## 2023-05-17 DIAGNOSIS — R1312 Dysphagia, oropharyngeal phase: Secondary | ICD-10-CM | POA: Diagnosis not present

## 2023-05-17 DIAGNOSIS — M6281 Muscle weakness (generalized): Secondary | ICD-10-CM | POA: Diagnosis not present

## 2023-05-17 DIAGNOSIS — R471 Dysarthria and anarthria: Secondary | ICD-10-CM | POA: Diagnosis not present

## 2023-05-17 DIAGNOSIS — G1221 Amyotrophic lateral sclerosis: Secondary | ICD-10-CM | POA: Diagnosis not present

## 2023-05-17 MED ORDER — FUROSEMIDE 20 MG PO TABS: 20.0000 mg | ORAL_TABLET | Freq: Every day | ORAL | 0 refills | Status: AC

## 2023-05-17 NOTE — Telephone Encounter (Signed)
Spoke to [name] [name] and inform her of provider's approval. Verbalized understanding.

## 2023-05-17 NOTE — Telephone Encounter (Signed)
Spoke to [name] on the phone.   He states pt's swelling seem getting better. Pt has been consistent with feet being elevated, using compression socks. Son, Gabriel Rung, reports he is having pt to move a little bit more.   Son said to send Rx in, in case they will need it.

## 2023-05-18 DIAGNOSIS — G1221 Amyotrophic lateral sclerosis: Secondary | ICD-10-CM | POA: Diagnosis not present

## 2023-05-18 DIAGNOSIS — R471 Dysarthria and anarthria: Secondary | ICD-10-CM | POA: Diagnosis not present

## 2023-05-18 DIAGNOSIS — E46 Unspecified protein-calorie malnutrition: Secondary | ICD-10-CM | POA: Diagnosis not present

## 2023-05-18 DIAGNOSIS — M6281 Muscle weakness (generalized): Secondary | ICD-10-CM | POA: Diagnosis not present

## 2023-05-18 DIAGNOSIS — R498 Other voice and resonance disorders: Secondary | ICD-10-CM | POA: Diagnosis not present

## 2023-05-18 DIAGNOSIS — R1312 Dysphagia, oropharyngeal phase: Secondary | ICD-10-CM | POA: Diagnosis not present

## 2023-05-18 NOTE — Telephone Encounter (Signed)
Ok so send in lasix 20 mg 1 po every day for 5 days if needed for swelling  disp 20 no refills

## 2023-05-18 NOTE — Telephone Encounter (Signed)
Rx sent 

## 2023-05-19 DIAGNOSIS — R471 Dysarthria and anarthria: Secondary | ICD-10-CM | POA: Diagnosis not present

## 2023-05-19 DIAGNOSIS — E46 Unspecified protein-calorie malnutrition: Secondary | ICD-10-CM | POA: Diagnosis not present

## 2023-05-19 DIAGNOSIS — G1221 Amyotrophic lateral sclerosis: Secondary | ICD-10-CM | POA: Diagnosis not present

## 2023-05-19 DIAGNOSIS — R1312 Dysphagia, oropharyngeal phase: Secondary | ICD-10-CM | POA: Diagnosis not present

## 2023-05-19 DIAGNOSIS — M6281 Muscle weakness (generalized): Secondary | ICD-10-CM | POA: Diagnosis not present

## 2023-05-19 DIAGNOSIS — R498 Other voice and resonance disorders: Secondary | ICD-10-CM | POA: Diagnosis not present

## 2023-05-23 DIAGNOSIS — M6281 Muscle weakness (generalized): Secondary | ICD-10-CM | POA: Diagnosis not present

## 2023-05-23 DIAGNOSIS — R498 Other voice and resonance disorders: Secondary | ICD-10-CM | POA: Diagnosis not present

## 2023-05-23 DIAGNOSIS — R471 Dysarthria and anarthria: Secondary | ICD-10-CM | POA: Diagnosis not present

## 2023-05-23 DIAGNOSIS — G1221 Amyotrophic lateral sclerosis: Secondary | ICD-10-CM | POA: Diagnosis not present

## 2023-05-23 DIAGNOSIS — E46 Unspecified protein-calorie malnutrition: Secondary | ICD-10-CM | POA: Diagnosis not present

## 2023-05-23 DIAGNOSIS — R1312 Dysphagia, oropharyngeal phase: Secondary | ICD-10-CM | POA: Diagnosis not present

## 2023-05-27 DIAGNOSIS — E039 Hypothyroidism, unspecified: Secondary | ICD-10-CM | POA: Diagnosis not present

## 2023-05-27 DIAGNOSIS — Z9989 Dependence on other enabling machines and devices: Secondary | ICD-10-CM | POA: Diagnosis not present

## 2023-05-27 DIAGNOSIS — E46 Unspecified protein-calorie malnutrition: Secondary | ICD-10-CM | POA: Diagnosis not present

## 2023-05-27 DIAGNOSIS — R498 Other voice and resonance disorders: Secondary | ICD-10-CM | POA: Diagnosis not present

## 2023-05-27 DIAGNOSIS — Z7951 Long term (current) use of inhaled steroids: Secondary | ICD-10-CM | POA: Diagnosis not present

## 2023-05-27 DIAGNOSIS — M810 Age-related osteoporosis without current pathological fracture: Secondary | ICD-10-CM | POA: Diagnosis not present

## 2023-05-27 DIAGNOSIS — R1312 Dysphagia, oropharyngeal phase: Secondary | ICD-10-CM | POA: Diagnosis not present

## 2023-05-27 DIAGNOSIS — R471 Dysarthria and anarthria: Secondary | ICD-10-CM | POA: Diagnosis not present

## 2023-05-27 DIAGNOSIS — Z681 Body mass index (BMI) 19 or less, adult: Secondary | ICD-10-CM | POA: Diagnosis not present

## 2023-05-27 DIAGNOSIS — M6281 Muscle weakness (generalized): Secondary | ICD-10-CM | POA: Diagnosis not present

## 2023-05-27 DIAGNOSIS — G1221 Amyotrophic lateral sclerosis: Secondary | ICD-10-CM | POA: Diagnosis not present

## 2023-06-01 DIAGNOSIS — M6281 Muscle weakness (generalized): Secondary | ICD-10-CM | POA: Diagnosis not present

## 2023-06-01 DIAGNOSIS — R498 Other voice and resonance disorders: Secondary | ICD-10-CM | POA: Diagnosis not present

## 2023-06-01 DIAGNOSIS — R1312 Dysphagia, oropharyngeal phase: Secondary | ICD-10-CM | POA: Diagnosis not present

## 2023-06-01 DIAGNOSIS — G1221 Amyotrophic lateral sclerosis: Secondary | ICD-10-CM | POA: Diagnosis not present

## 2023-06-01 DIAGNOSIS — E46 Unspecified protein-calorie malnutrition: Secondary | ICD-10-CM | POA: Diagnosis not present

## 2023-06-01 DIAGNOSIS — R471 Dysarthria and anarthria: Secondary | ICD-10-CM | POA: Diagnosis not present

## 2023-06-02 DIAGNOSIS — G1221 Amyotrophic lateral sclerosis: Secondary | ICD-10-CM | POA: Diagnosis not present

## 2023-06-02 DIAGNOSIS — R1312 Dysphagia, oropharyngeal phase: Secondary | ICD-10-CM | POA: Diagnosis not present

## 2023-06-02 DIAGNOSIS — E46 Unspecified protein-calorie malnutrition: Secondary | ICD-10-CM | POA: Diagnosis not present

## 2023-06-02 DIAGNOSIS — M6281 Muscle weakness (generalized): Secondary | ICD-10-CM | POA: Diagnosis not present

## 2023-06-02 DIAGNOSIS — R498 Other voice and resonance disorders: Secondary | ICD-10-CM | POA: Diagnosis not present

## 2023-06-02 DIAGNOSIS — R471 Dysarthria and anarthria: Secondary | ICD-10-CM | POA: Diagnosis not present

## 2023-06-08 DIAGNOSIS — R1312 Dysphagia, oropharyngeal phase: Secondary | ICD-10-CM | POA: Diagnosis not present

## 2023-06-08 DIAGNOSIS — M6281 Muscle weakness (generalized): Secondary | ICD-10-CM | POA: Diagnosis not present

## 2023-06-08 DIAGNOSIS — R471 Dysarthria and anarthria: Secondary | ICD-10-CM | POA: Diagnosis not present

## 2023-06-08 DIAGNOSIS — R498 Other voice and resonance disorders: Secondary | ICD-10-CM | POA: Diagnosis not present

## 2023-06-08 DIAGNOSIS — E46 Unspecified protein-calorie malnutrition: Secondary | ICD-10-CM | POA: Diagnosis not present

## 2023-06-08 DIAGNOSIS — G1221 Amyotrophic lateral sclerosis: Secondary | ICD-10-CM | POA: Diagnosis not present

## 2023-06-09 DIAGNOSIS — E46 Unspecified protein-calorie malnutrition: Secondary | ICD-10-CM | POA: Diagnosis not present

## 2023-06-09 DIAGNOSIS — G1221 Amyotrophic lateral sclerosis: Secondary | ICD-10-CM | POA: Diagnosis not present

## 2023-06-09 DIAGNOSIS — R1312 Dysphagia, oropharyngeal phase: Secondary | ICD-10-CM | POA: Diagnosis not present

## 2023-06-09 DIAGNOSIS — R471 Dysarthria and anarthria: Secondary | ICD-10-CM | POA: Diagnosis not present

## 2023-06-09 DIAGNOSIS — R498 Other voice and resonance disorders: Secondary | ICD-10-CM | POA: Diagnosis not present

## 2023-06-09 DIAGNOSIS — M6281 Muscle weakness (generalized): Secondary | ICD-10-CM | POA: Diagnosis not present

## 2023-06-14 DIAGNOSIS — E039 Hypothyroidism, unspecified: Secondary | ICD-10-CM | POA: Diagnosis not present

## 2023-06-14 DIAGNOSIS — Z681 Body mass index (BMI) 19 or less, adult: Secondary | ICD-10-CM | POA: Diagnosis not present

## 2023-06-14 DIAGNOSIS — Z7951 Long term (current) use of inhaled steroids: Secondary | ICD-10-CM | POA: Diagnosis not present

## 2023-06-14 DIAGNOSIS — R1312 Dysphagia, oropharyngeal phase: Secondary | ICD-10-CM | POA: Diagnosis not present

## 2023-06-14 DIAGNOSIS — R498 Other voice and resonance disorders: Secondary | ICD-10-CM | POA: Diagnosis not present

## 2023-06-14 DIAGNOSIS — E46 Unspecified protein-calorie malnutrition: Secondary | ICD-10-CM | POA: Diagnosis not present

## 2023-06-14 DIAGNOSIS — R471 Dysarthria and anarthria: Secondary | ICD-10-CM | POA: Diagnosis not present

## 2023-06-14 DIAGNOSIS — M810 Age-related osteoporosis without current pathological fracture: Secondary | ICD-10-CM | POA: Diagnosis not present

## 2023-06-14 DIAGNOSIS — G1221 Amyotrophic lateral sclerosis: Secondary | ICD-10-CM | POA: Diagnosis not present

## 2023-06-14 DIAGNOSIS — M6281 Muscle weakness (generalized): Secondary | ICD-10-CM | POA: Diagnosis not present

## 2023-06-14 DIAGNOSIS — Z9989 Dependence on other enabling machines and devices: Secondary | ICD-10-CM | POA: Diagnosis not present

## 2023-06-15 ENCOUNTER — Ambulatory Visit: Payer: Medicare Other | Admitting: Internal Medicine

## 2023-06-16 ENCOUNTER — Encounter: Payer: Self-pay | Admitting: Adult Health

## 2023-06-16 ENCOUNTER — Ambulatory Visit: Payer: Medicare Other | Admitting: Adult Health

## 2023-06-16 ENCOUNTER — Ambulatory Visit (INDEPENDENT_AMBULATORY_CARE_PROVIDER_SITE_OTHER): Payer: Medicare Other | Admitting: Adult Health

## 2023-06-16 VITALS — BP 120/80 | HR 88 | Temp 97.6°F | Ht 60.0 in | Wt 105.0 lb

## 2023-06-16 DIAGNOSIS — R21 Rash and other nonspecific skin eruption: Secondary | ICD-10-CM | POA: Diagnosis not present

## 2023-06-16 DIAGNOSIS — R600 Localized edema: Secondary | ICD-10-CM

## 2023-06-16 DIAGNOSIS — R1312 Dysphagia, oropharyngeal phase: Secondary | ICD-10-CM | POA: Diagnosis not present

## 2023-06-16 DIAGNOSIS — R498 Other voice and resonance disorders: Secondary | ICD-10-CM | POA: Diagnosis not present

## 2023-06-16 DIAGNOSIS — S41111A Laceration without foreign body of right upper arm, initial encounter: Secondary | ICD-10-CM | POA: Diagnosis not present

## 2023-06-16 DIAGNOSIS — M6281 Muscle weakness (generalized): Secondary | ICD-10-CM | POA: Diagnosis not present

## 2023-06-16 DIAGNOSIS — G1221 Amyotrophic lateral sclerosis: Secondary | ICD-10-CM | POA: Diagnosis not present

## 2023-06-16 DIAGNOSIS — E46 Unspecified protein-calorie malnutrition: Secondary | ICD-10-CM | POA: Diagnosis not present

## 2023-06-16 DIAGNOSIS — R471 Dysarthria and anarthria: Secondary | ICD-10-CM | POA: Diagnosis not present

## 2023-06-16 NOTE — Progress Notes (Signed)
Subjective:    Patient ID: [name] [name], female    DOB: 1941-01-19, 82 y.o.   MRN: [id number]  [place]    83 year old female who  has a past medical history of COVID (10/25/2020), Dog bite of thigh (04/09/2012), Elevated lipids, GERD (gastroesophageal reflux disease), Hypertension, Hypothyroidism, Osteoarthritis, Osteopenia, Osteoporosis, Seasonal rhinitis, Skin cancer, and Vaginal prolapse.  She is a patient of Dr. Fabian Sharp who I am seeing today for an acute issue. She presents to the office today with her son.   Her son reports that over the last 3 weeks she has had bilateral lower extremity edema been getting slightly worse over the last 10 days.  Also over the last 10 days they have noticed intermittent "rash" across the top of her foot that causes itchiness and is worse at night.  At first I thought the rash was due to applying deep blue-cream on her legs every night so they stopped this and the rash seemed to improve a little bit but when she wears her shoes the rash usually comes back.  They have been applying cortisone cream when the rash is present and this also seems to be helping. There has been no change in soaps or detergents. As for the edema, family started using compression socks when they first noticed the edema 3 weeks ago and this seemed to help till about 10 days ago and the compression socks did not seem to help much.  She has very little oral intake when it comes to fluids and maybe gets in 25 to 50 mL a day, her ALS makes it difficult and painful to swallow.  She also sleeps in a recliner so she is not able to elevate her legs much but family has noticed that when she gets up in the morning her legs are less swollen.  Her pulmonologist did send in Lasix 20 mg daily to use if needed but family would like to try and not have to use Lasix to get the fluid off  She also sustained a skin tear to her right arm earlier this week when her dog scratched her arm.   Review of  Systems See HPI   Past Medical History:  Diagnosis Date   COVID 10/25/2020   Dog bite of thigh 04/09/2012   Elevated lipids    GERD (gastroesophageal reflux disease)    Hypertension    Hypothyroidism    post thyroidectomy   Osteoarthritis    Osteopenia    t -2.5 hip in 2005 by Dexa   Osteoporosis    commpr fx after dog related fall  injury   Seasonal rhinitis    spring and fall   Skin cancer    squamous cell ca chest   Vaginal prolapse    hs corrected    Social History   Socioeconomic History   Marital status: Widowed    Spouse name: Not on file   Number of children: 1   Years of education: Not on file   Highest education level: Associate degree: academic program  Occupational History   Occupation: retired  Tobacco Use   Smoking status: Never   Smokeless tobacco: Never  Vaping Use   Vaping status: Never Used  Substance and Sexual Activity   Alcohol use: Yes    Comment: occasionally   Drug use: No   Sexual activity: Not Currently    Partners: Male    Birth control/protection: Surgical    Comment: hysterectomy, older than 16, less than 5  Other Topics Concern   Not on file  Social History Narrative   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   Regular Exercise- yes      1 son, never smoker no alcohol 1 caffeinated beverage   Social Drivers of Corporate investment banker Strain: Low Risk  (10/29/2022)   Received from [facility], [facility]   Overall Financial Resource Strain (CARDIA)    Difficulty of Paying Living Expenses: Not hard at all  Food Insecurity: No Food Insecurity (03/01/2023)   Received from [facility]   Hunger Vital Sign    Worried About Running Out of Food in the Last Year: Never true    Ran Out of Food in the Last Year: Never true  Transportation Needs: No Transportation Needs (03/01/2023)   Received from [facility] -  Transportation    In the past 12 months, has lack of transportation kept you from medical appointments or from getting medications?: No    Lack of Transportation (Non-Medical): No  Physical Activity: Insufficiently Active (05/24/2022)   Exercise Vital Sign    Days of Exercise per Week: 5 days    Minutes of Exercise per Session: 20 min  Stress: No Stress Concern Present (05/24/2022)   [employer] of Occupational Health - Occupational Stress Questionnaire    Feeling of Stress : Not at all  Social Connections: Unknown (03/25/2023)   Received from [facility]   Social Network    Social Network: Not on file  Intimate Partner Violence: Not At Risk (03/25/2023)   Received from [facility]   HITS    Over the last 12 months how often did your partner physically hurt you?: Never    Over the last 12 months how often did your partner insult you or talk down to you?: Never    Over the last 12 months how often did your partner threaten you with physical harm?: Never    Over the last 12 months how often did your partner scream or curse at you?: Never    Past Surgical History:  Procedure Laterality Date   ABDOMINAL HYSTERECTOMY  07/2007   bladders sling surgery  07/2007   COLONOSCOPY  2006   THYROIDECTOMY      Family History  Problem Relation Age of Onset   Heart attack Mother    Alcohol abuse Mother    Kidney cancer Father    Hypertension Maternal Grandmother    Esophageal cancer Neg Hx    Colon cancer Neg Hx    Pancreatic cancer Neg Hx    Stomach cancer Neg Hx     Allergies  Allergen Reactions   Capsicum Annuum Extract & Derivative (Bell Pepper) [Capsicum]     Other reaction(s): Other (See Comments)   Milk-Related Compounds Other (See Comments)   Onion Other (See Comments)   Other Other (See Comments)    Malon Kindle    Current Outpatient Medications on File Prior to Visit  Medication Sig Dispense Refill   albuterol (VENTOLIN HFA) 108 (90 Base) MCG/ACT inhaler  SMARTSIG:2 Inhalation Via Inhaler Every 6 Hours PRN     amLODipine (NORVASC) 2.5 MG tablet Take 1 tablet by mouth daily.     atropine 1 % ophthalmic solution For SUBLINGUAL use ONLY. Apply one drop sublingually every 4-6 hours for oral secretions as need while awake. Please do not apply to eyes.     budesonide-formoterol (  SYMBICORT) 80-4.5 MCG/ACT inhaler INHALE 2 PUFFS INTO THE LUNGS TWICE DAILY 10.2 g 1   denosumab (PROLIA) 60 MG/ML SOLN injection Inject 60 mg into the skin every 6 (six) months. Administer in upper arm, thigh, or abdomen     furosemide (LASIX) 20 MG tablet Take 1 tablet (20 mg total) by mouth daily. 5 tablet 0   levothyroxine (SYNTHROID) 100 MCG tablet TAKE 1 TABLET(100 MCG) BY MOUTH DAILY 90 tablet 3   pantoprazole (PROTONIX) 20 MG tablet Take 1 tablet (20 mg total) by mouth daily. 90 tablet 0   riluzole (RILUTEK) 50 MG tablet Take 50 mg by mouth. Has not pick up yet.     traZODone (DESYREL) 50 MG tablet Take 50 mg by mouth. Taking 1/2 tab.     No current facility-administered medications on file prior to visit.    BP 120/80   Pulse 88   Temp 97.6 F (36.4 C) (Oral)   Ht 5' (1.524 m)   Wt 105 lb (47.6 kg)   SpO2 99%   BMI 20.51 kg/m       Objective:   Physical Exam Vitals and nursing note reviewed.  Constitutional:      Appearance: Normal appearance.  Cardiovascular:     Rate and Rhythm: Regular rhythm.     Pulses: Normal pulses.     Heart sounds: Normal heart sounds.     Comments: He does have bilateral pitting edema noted across the tops of her feet to above her ankle. Pulmonary:     Effort: Pulmonary effort is normal.     Breath sounds: Normal breath sounds.  Musculoskeletal:     Right lower leg: 1+ Pitting Edema present.     Left lower leg: 1+ Pitting Edema present.  Skin:    Findings: No rash.          Comments: No rash noted across her feet today  Neurological:     Mental Status: She is alert.  Psychiatric:        Mood and Affect: Mood  normal.        Behavior: Behavior normal.        Thought Content: Thought content normal.        Judgment: Judgment normal.        Assessment & Plan:  1. Skin tear of right upper arm without complication, initial encounter (Primary) - No signs of infection - Keep area clean and dry  - Cover with bandage for next week   2. Lower extremity edema - Encouraged to increase fluids if she can  - Elevate legs higher  - Can continue to use compression socks  - If needed take one tab of lasix PRN   3. Rash - No rash noted today  - Can try taking Zyrtec at night for itching   Shirline Frees, NP  Time spent with patient today was 36 minutes which consisted of chart review, discussing skin tears, lower extremity edema and rash, work up, treatment answering questions and documentation.

## 2023-06-22 ENCOUNTER — Other Ambulatory Visit: Payer: Self-pay | Admitting: Family

## 2023-06-23 DIAGNOSIS — M6281 Muscle weakness (generalized): Secondary | ICD-10-CM | POA: Diagnosis not present

## 2023-06-23 DIAGNOSIS — R1312 Dysphagia, oropharyngeal phase: Secondary | ICD-10-CM | POA: Diagnosis not present

## 2023-06-23 DIAGNOSIS — G1221 Amyotrophic lateral sclerosis: Secondary | ICD-10-CM | POA: Diagnosis not present

## 2023-06-23 DIAGNOSIS — E46 Unspecified protein-calorie malnutrition: Secondary | ICD-10-CM | POA: Diagnosis not present

## 2023-06-23 DIAGNOSIS — R471 Dysarthria and anarthria: Secondary | ICD-10-CM | POA: Diagnosis not present

## 2023-06-23 DIAGNOSIS — R498 Other voice and resonance disorders: Secondary | ICD-10-CM | POA: Diagnosis not present

## 2023-06-26 ENCOUNTER — Telehealth: Payer: Self-pay

## 2023-06-26 DIAGNOSIS — R471 Dysarthria and anarthria: Secondary | ICD-10-CM | POA: Diagnosis not present

## 2023-06-26 DIAGNOSIS — Z7951 Long term (current) use of inhaled steroids: Secondary | ICD-10-CM | POA: Diagnosis not present

## 2023-06-26 DIAGNOSIS — M810 Age-related osteoporosis without current pathological fracture: Secondary | ICD-10-CM | POA: Diagnosis not present

## 2023-06-26 DIAGNOSIS — E46 Unspecified protein-calorie malnutrition: Secondary | ICD-10-CM | POA: Diagnosis not present

## 2023-06-26 DIAGNOSIS — R1312 Dysphagia, oropharyngeal phase: Secondary | ICD-10-CM | POA: Diagnosis not present

## 2023-06-26 DIAGNOSIS — Z9989 Dependence on other enabling machines and devices: Secondary | ICD-10-CM | POA: Diagnosis not present

## 2023-06-26 DIAGNOSIS — G1221 Amyotrophic lateral sclerosis: Secondary | ICD-10-CM | POA: Diagnosis not present

## 2023-06-26 DIAGNOSIS — M6281 Muscle weakness (generalized): Secondary | ICD-10-CM | POA: Diagnosis not present

## 2023-06-26 DIAGNOSIS — E039 Hypothyroidism, unspecified: Secondary | ICD-10-CM | POA: Diagnosis not present

## 2023-06-26 DIAGNOSIS — Z681 Body mass index (BMI) 19 or less, adult: Secondary | ICD-10-CM | POA: Diagnosis not present

## 2023-06-26 DIAGNOSIS — R498 Other voice and resonance disorders: Secondary | ICD-10-CM | POA: Diagnosis not present

## 2023-06-26 NOTE — Telephone Encounter (Signed)
Copied from CRM [id number]. Topic: Clinical - Home Health Verbal Orders >> Jun 23, 2023 12:56 PM Adelina Mings wrote: Caller/Agency: Glynda Jaeger Number: [phone] Service Requested: Occupational Therapy Frequency: 1x a week for 6 weeks  Any new concerns about the patient? No

## 2023-06-29 DIAGNOSIS — R471 Dysarthria and anarthria: Secondary | ICD-10-CM | POA: Diagnosis not present

## 2023-06-29 DIAGNOSIS — R498 Other voice and resonance disorders: Secondary | ICD-10-CM | POA: Diagnosis not present

## 2023-06-29 DIAGNOSIS — R1312 Dysphagia, oropharyngeal phase: Secondary | ICD-10-CM | POA: Diagnosis not present

## 2023-06-29 DIAGNOSIS — G1221 Amyotrophic lateral sclerosis: Secondary | ICD-10-CM | POA: Diagnosis not present

## 2023-06-29 DIAGNOSIS — M6281 Muscle weakness (generalized): Secondary | ICD-10-CM | POA: Diagnosis not present

## 2023-06-29 DIAGNOSIS — E46 Unspecified protein-calorie malnutrition: Secondary | ICD-10-CM | POA: Diagnosis not present

## 2023-06-30 DIAGNOSIS — M6281 Muscle weakness (generalized): Secondary | ICD-10-CM | POA: Diagnosis not present

## 2023-06-30 DIAGNOSIS — R471 Dysarthria and anarthria: Secondary | ICD-10-CM | POA: Diagnosis not present

## 2023-06-30 DIAGNOSIS — G1221 Amyotrophic lateral sclerosis: Secondary | ICD-10-CM | POA: Diagnosis not present

## 2023-06-30 DIAGNOSIS — E46 Unspecified protein-calorie malnutrition: Secondary | ICD-10-CM | POA: Diagnosis not present

## 2023-06-30 DIAGNOSIS — R498 Other voice and resonance disorders: Secondary | ICD-10-CM | POA: Diagnosis not present

## 2023-06-30 DIAGNOSIS — R1312 Dysphagia, oropharyngeal phase: Secondary | ICD-10-CM | POA: Diagnosis not present

## 2023-07-02 NOTE — Telephone Encounter (Signed)
 Ok to order as requested

## 2023-07-04 ENCOUNTER — Telehealth: Payer: Self-pay | Admitting: *Deleted

## 2023-07-04 NOTE — Telephone Encounter (Signed)
Insurance information submitted to [employer] portal. Will await summary of benefits for prolia.    

## 2023-07-04 NOTE — Telephone Encounter (Signed)
 Spoke to [place] with Occupational Therapy. Inform her of provider's approval. Verbalized understanding.

## 2023-07-06 ENCOUNTER — Telehealth: Payer: Self-pay | Admitting: *Deleted

## 2023-07-06 NOTE — Telephone Encounter (Signed)
 Copied from CRM [id number]. Topic: Clinical - Home Health Verbal Orders >> Jul 06, 2023 12:02 PM Joanell NOVAK wrote: Caller/Agency: [facility]  Callback Number: [phone] Service Requested: Plan of care update, add on discipline  Frequency: Pt 1 Week 9, Speech 1 week 1  Any new concerns about the patient? No  Would like to request a callback to confirm the paperwork has been received

## 2023-07-07 DIAGNOSIS — R498 Other voice and resonance disorders: Secondary | ICD-10-CM | POA: Diagnosis not present

## 2023-07-07 DIAGNOSIS — E46 Unspecified protein-calorie malnutrition: Secondary | ICD-10-CM | POA: Diagnosis not present

## 2023-07-07 DIAGNOSIS — R471 Dysarthria and anarthria: Secondary | ICD-10-CM | POA: Diagnosis not present

## 2023-07-07 DIAGNOSIS — R1312 Dysphagia, oropharyngeal phase: Secondary | ICD-10-CM | POA: Diagnosis not present

## 2023-07-07 DIAGNOSIS — M6281 Muscle weakness (generalized): Secondary | ICD-10-CM | POA: Diagnosis not present

## 2023-07-07 DIAGNOSIS — G1221 Amyotrophic lateral sclerosis: Secondary | ICD-10-CM | POA: Diagnosis not present

## 2023-07-07 NOTE — Telephone Encounter (Signed)
 Attempted to reach [place]. Left a voicemail to call us back.

## 2023-07-10 ENCOUNTER — Encounter: Payer: Self-pay | Admitting: Internal Medicine

## 2023-07-10 NOTE — Telephone Encounter (Signed)
 Relay the message from provider to [facility].   She updates that the speech therapy just change the frequency to every 2 wk for 8 wks. Will in send via paper.

## 2023-07-12 ENCOUNTER — Encounter: Payer: Self-pay | Admitting: Family Medicine

## 2023-07-12 ENCOUNTER — Ambulatory Visit (INDEPENDENT_AMBULATORY_CARE_PROVIDER_SITE_OTHER): Payer: Medicare Other | Admitting: Family Medicine

## 2023-07-12 VITALS — BP 126/80 | HR 110 | Resp 16 | Ht 60.0 in | Wt 100.1 lb

## 2023-07-12 DIAGNOSIS — E039 Hypothyroidism, unspecified: Secondary | ICD-10-CM

## 2023-07-12 DIAGNOSIS — B370 Candidal stomatitis: Secondary | ICD-10-CM

## 2023-07-12 DIAGNOSIS — I10 Essential (primary) hypertension: Secondary | ICD-10-CM | POA: Diagnosis not present

## 2023-07-12 DIAGNOSIS — Z7409 Other reduced mobility: Secondary | ICD-10-CM | POA: Diagnosis not present

## 2023-07-12 DIAGNOSIS — K219 Gastro-esophageal reflux disease without esophagitis: Secondary | ICD-10-CM

## 2023-07-12 DIAGNOSIS — R1312 Dysphagia, oropharyngeal phase: Secondary | ICD-10-CM | POA: Diagnosis not present

## 2023-07-12 DIAGNOSIS — Z789 Other specified health status: Secondary | ICD-10-CM | POA: Diagnosis not present

## 2023-07-12 DIAGNOSIS — R471 Dysarthria and anarthria: Secondary | ICD-10-CM | POA: Diagnosis not present

## 2023-07-12 DIAGNOSIS — G1221 Amyotrophic lateral sclerosis: Secondary | ICD-10-CM | POA: Diagnosis not present

## 2023-07-12 MED ORDER — PANTOPRAZOLE SODIUM 40 MG PO PACK: 20.0000 mg | PACK | Freq: Every day | ORAL | 1 refills | Status: AC

## 2023-07-12 MED ORDER — LEVOTHYROXINE SODIUM 112 MCG/ML PO SOLN: 0.9000 mL | Freq: Every day | ORAL | 2 refills | Status: DC

## 2023-07-12 MED ORDER — NYSTATIN 100000 UNIT/ML MT SUSP: 5.0000 mL | Freq: Three times a day (TID) | OROMUCOSAL | 0 refills | Status: AC

## 2023-07-12 MED ORDER — AMLODIPINE 1 MG/ML ORAL SUSPENSION: 2.5000 mg | Freq: Every day | ORAL | 2 refills | Status: AC

## 2023-07-12 NOTE — Patient Instructions (Addendum)
 A few things to remember from today's visit:  Gastroesophageal reflux disease, unspecified whether esophagitis present - Plan: pantoprazole  sodium (PROTONIX ) 40 mg  Hypothyroidism, unspecified type - Plan: Levothyroxine  Sodium 112 MCG/ML SOLN  Oral thrush - Plan: nystatin  (MYCOSTATIN ) 100000 UNIT/ML suspension  Continue monitoring blood pressure and if under 140/90 , I do not think amlodipine  needs to be resumed. TSH in 6-8 weeks. Nystatin  around mouth 3 times daily for 2 weeks then as needed.  If you need refills for medications you take chronically, please call your pharmacy. Do not use My Chart to request refills or for acute issues that need immediate attention. If you send a my chart message, it may take a few days to be addressed, specially if I am not in the office.  Please be sure medication list is accurate. If a new problem present, please set up appointment sooner than planned today.

## 2023-07-12 NOTE — Progress Notes (Signed)
ACUTE VISIT Chief Complaint  Patient presents with   [name] [name]   HPI: Ms.Hades Mathew Okolo is a 83 y.o. female with a PMHx significant for GERD, hypothyroidism, osteoporosis, ALS, and chronic back pain, who is here today with her son complaining of oral mucosa lesions.  Patient's son states she began having an occasional burning tongue sensation while taking her pills or eating 4-5 weeks ago. He started to see white spots and lesions in her mouth about 3-4 days ago. Whitish coat is easy to wipe away, mild erythema underneath, recurrent. Her mouth has been dry.  He mentions she has been eating less, but attributes this to the burning sensation rather than a lack of appetite.  He has tried using chloraseptic spray.  Denies sore throat, fever, chills.   He mentions she uses a Symbicort inhaler, she rinses her mouth after every use.   Her son also asks about getting an oral suspension form of her levothyroxine and amlodipine, and a capsule for pantoprazole.  Dysphagia due to ALS, getting worse. On speech therapy.  GERD: She is on Protonix 20 mg daily. No heartburn but burping when she does not take medication. No more cough than usual.  HTN on Amlodipine 2.5 mg daily, which she has not taken for 5 days due to difficulty swallowing. BP has been good, < 140/90. She follows with cardiologist regularly. Negative for CP, palpitations,or worsening LE edema.  Hypothyroidism: She is on Synthroid 100 mcg daily. Lab Results  Component Value Date   TSH 3.19 07/20/2022   Review of Systems  Constitutional:  Negative for activity change, chills and fever.  Respiratory:  Negative for shortness of breath and wheezing.   Cardiovascular:  Negative for chest pain.  Gastrointestinal:  Negative for abdominal pain, nausea and vomiting.  Endocrine: Negative for cold intolerance and heat intolerance.  Genitourinary:  Negative for dysuria and hematuria.  Skin:  Negative for rash.  Neurological:  Negative  for syncope.  Psychiatric/Behavioral:  Negative for confusion.   See other pertinent positives and negatives in HPI.  Current Outpatient Medications on File Prior to Visit  Medication Sig Dispense Refill   albuterol (VENTOLIN HFA) 108 (90 Base) MCG/ACT inhaler SMARTSIG:2 Inhalation Via Inhaler Every 6 Hours PRN     atropine 1 % ophthalmic solution For SUBLINGUAL use ONLY. Apply one drop sublingually every 4-6 hours for oral secretions as need while awake. Please do not apply to eyes.     budesonide-formoterol (SYMBICORT) 80-4.5 MCG/ACT inhaler INHALE 2 PUFFS INTO THE LUNGS TWICE DAILY 10.2 g 1   denosumab (PROLIA) 60 MG/ML SOLN injection Inject 60 mg into the skin every 6 (six) months. Administer in upper arm, thigh, or abdomen     furosemide (LASIX) 20 MG tablet Take 1 tablet (20 mg total) by mouth daily. 5 tablet 0   riluzole (RILUTEK) 50 MG tablet Take 50 mg by mouth. Has not pick up yet.     traZODone (DESYREL) 50 MG tablet Take 50 mg by mouth. Taking 1/2 tab.     No current facility-administered medications on file prior to visit.   Past Medical History:  Diagnosis Date   COVID 10/25/2020   Dog bite of thigh 04/09/2012   Elevated lipids    GERD (gastroesophageal reflux disease)    Hypertension    Hypothyroidism    post thyroidectomy   Osteoarthritis    Osteopenia    t -2.5 hip in 2005 by Dexa   Osteoporosis    commpr fx after dog related  fall  injury   Seasonal rhinitis    spring and fall   Skin cancer    squamous cell ca chest   Vaginal prolapse    hs corrected   Allergies  Allergen Reactions   Capsicum Annuum Extract & Derivative (Bell Pepper) [Capsicum]     Other reaction(s): Other (See Comments)   Milk-Related Compounds Other (See Comments)   Onion Other (See Comments)   Other Other (See Comments)    Malon Kindle    Social History   Socioeconomic History   Marital status: Widowed    Spouse name: Not on file   Number of children: 1   Years of education:  Not on file   Highest education level: Associate degree: academic program  Occupational History   Occupation: retired  Tobacco Use   Smoking status: Never   Smokeless tobacco: Never  Vaping Use   Vaping status: Never Used  Substance and Sexual Activity   Alcohol use: Yes    Comment: occasionally   Drug use: No   Sexual activity: Not Currently    Partners: Male    Birth control/protection: Surgical    Comment: hysterectomy, older than 16, less than 5  Other Topics Concern   Not on file  Social History Narrative   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   Regular Exercise- yes      1 son, never smoker no alcohol 1 caffeinated beverage   Social Drivers of Corporate investment banker Strain: Low Risk  (10/29/2022)   Received from [facility]   Overall Financial Resource Strain (CARDIA)    Difficulty of Paying Living Expenses: Not hard at all  Food Insecurity: No Food Insecurity (03/01/2023)   Received from [facility]   Hunger Vital Sign    Worried About Running Out of Food in the Last Year: Never true    Ran Out of Food in the Last Year: Never true  Transportation Needs: No Transportation Needs (03/01/2023)   Received from [facility] - Transportation    In the past 12 months, has lack of transportation kept you from medical appointments or from getting medications?: No    Lack of Transportation (Non-Medical): No  Physical Activity: Insufficiently Active (05/24/2022)   Exercise Vital Sign    Days of Exercise per Week: 5 days    Minutes of Exercise per Session: 20 min  Stress: No Stress Concern Present (05/24/2022)   [employer] of Occupational Health - Occupational Stress Questionnaire    Feeling of Stress : Not at all  Social Connections: Unknown (03/25/2023)   Received from [facility]   Social Network    Social Network: Not on file     Vitals:   07/12/23 1508  BP: 126/80  Pulse: (!) 110  Resp: 16  SpO2: 95%   Body mass index is 19.55 kg/m.  Physical Exam Vitals and nursing note reviewed.  Constitutional:      General: She is not in acute distress.    Appearance: She is well-developed.  HENT:     Head: Normocephalic and atraumatic.     Mouth/Throat:     Lips: Lesions present.     Mouth: Mucous membranes are moist.     Pharynx: Oropharynx is clear.     Comments: Small areas of whitish coated oral mucosa: posterior pharyngeal wall, palate, and tongue. Eyes:     Conjunctiva/sclera:  Conjunctivae normal.  Cardiovascular:     Rate and Rhythm: Normal rate. Rhythm irregular. Occasional Extrasystoles are present.    Heart sounds: No murmur heard.    Comments: HR 96/min Pulmonary:     Effort: Pulmonary effort is normal. No respiratory distress.     Breath sounds: Normal breath sounds.  Musculoskeletal:     Right lower leg: Edema present.     Left lower leg: Edema present.  Lymphadenopathy:     Cervical: No cervical adenopathy.  Skin:    General: Skin is warm.     Findings: No erythema or rash.  Neurological:     Mental Status: She is alert. Mental status is at baseline.     Comments: Unstable gait assisted with a walker.  Psychiatric:        Mood and Affect: Mood and affect normal.   ASSESSMENT AND PLAN:  Ms. Rothfuss was seen today for possible thrush.   Gastroesophageal reflux disease, unspecified whether esophagitis present Assessment & Plan: Currently is well-controlled, except for occasional burping. Due to difficulty swallowing, Protonix changed from tab to granule presentation to continue 20 mg daily. Continue GERD and aspiration precautions.  Orders: -     Pantoprazole Sodium; Take 20 mg by mouth daily.  Dispense: 30 packet; Refill: 1  Hypothyroidism, unspecified type Assessment & Plan: Problem is well-controlled. Tablet form changed to liquid to continue same dose, 100 mcg daily (  0.9 mL) of levothyroxine. TSH in 6 to 8 weeks.  Orders: -     Levothyroxine Sodium; Take 0.9 mLs by mouth daily.  Dispense: 30 mL; Refill: 2 -     TSH; Future  Oral thrush We discussed diagnosis, prognosis, and treatment. Recommend topical nystatin 3 times daily for 14 days, can resume in the future for recurrence. Continue rinsing after using Symbicort, 2-3 times. Follow-up as needed.  -     Nystatin; Take 5 mLs (500,000 Units total) by mouth in the morning, at noon, and at bedtime.  Dispense: 473 mL; Refill: 0  Essential (primary) hypertension She has not been on amlodipine for about 5 days due to difficulty swallowing tab and BP still under at 140/90. I did send a prescription for Amlodipine solution in case BP goes up and she needs to resume amlodipine at same dose, 2.5 mg daily. Follow-up with cardiologist.  -     amLODIPine; Take 2.5 mLs (2.5 mg total) by mouth daily.  Dispense: 75 mL; Refill: 2  I spent a total of 38 minutes in both face to face and non face to face activities for this visit on the date of this encounter. During this time history was obtained and documented, examination was performed, prior labs reviewed, and assessment/plan discussed.  Return if symptoms worsen or fail to improve, for lab in 7 weeks.  I, Rolla Etienne Wierda, acting as a scribe for Dayshia Ballinas [place], MD., have documented all relevant documentation on the behalf of Chanc Kervin [place], MD, as directed by  Briellah Baik [place], MD while in the presence of Yazmen Briones [place], MD.   I, Zebadiah Willert [place], MD, have reviewed all documentation for this visit. The documentation on 07/12/23 for the exam, diagnosis, procedures, and orders are all accurate and complete.  Louana Fontenot G. [place], MD  [facility]. [place] office.

## 2023-07-12 NOTE — Telephone Encounter (Signed)
 Message left to return call to [facility] at [phone] to review benefits and schedule prolia  injection.

## 2023-07-12 NOTE — Assessment & Plan Note (Signed)
 Problem is well-controlled. Tablet form changed to liquid to continue same dose, 100 mcg daily ( 0.9 mL) of levothyroxine . TSH in 6 to 8 weeks.

## 2023-07-12 NOTE — Telephone Encounter (Incomplete)
 Deductible:  $257 ($0 met)   OOP MAX: none   Annual exam: 03-09-22 ML  Calcium:    8.9        Date: 04/03/23  Upcoming dental procedures: {YES/NO:21197}  Hx of Kidney Disease: {YES/NO:21197}  Last Bone Density Scan:   Is Prior Authorization needed: no  Pt estimated Cost: $0    Coverage Details: Prolia  and administration will be subject to a $257.00 deductible ($0 met) and 20% coinsurance. For the secondary MD Purchase access option, this is a Medicare Supplement Plan F and it covers the Medicare Part B deductible, coinsurance and 100% of excess charges.

## 2023-07-12 NOTE — Assessment & Plan Note (Addendum)
 Currently is well-controlled, except for occasional burping. Due to difficulty swallowing, Protonix  changed from tab to granule presentation to continue 20 mg daily. Continue GERD and aspiration precautions.

## 2023-07-13 DIAGNOSIS — R1312 Dysphagia, oropharyngeal phase: Secondary | ICD-10-CM | POA: Diagnosis not present

## 2023-07-13 DIAGNOSIS — G1221 Amyotrophic lateral sclerosis: Secondary | ICD-10-CM | POA: Diagnosis not present

## 2023-07-13 DIAGNOSIS — R471 Dysarthria and anarthria: Secondary | ICD-10-CM | POA: Diagnosis not present

## 2023-07-13 DIAGNOSIS — E46 Unspecified protein-calorie malnutrition: Secondary | ICD-10-CM | POA: Diagnosis not present

## 2023-07-13 DIAGNOSIS — R498 Other voice and resonance disorders: Secondary | ICD-10-CM | POA: Diagnosis not present

## 2023-07-13 DIAGNOSIS — M6281 Muscle weakness (generalized): Secondary | ICD-10-CM | POA: Diagnosis not present

## 2023-07-14 DIAGNOSIS — R1312 Dysphagia, oropharyngeal phase: Secondary | ICD-10-CM | POA: Diagnosis not present

## 2023-07-14 DIAGNOSIS — R471 Dysarthria and anarthria: Secondary | ICD-10-CM | POA: Diagnosis not present

## 2023-07-14 DIAGNOSIS — G1221 Amyotrophic lateral sclerosis: Secondary | ICD-10-CM | POA: Diagnosis not present

## 2023-07-14 DIAGNOSIS — M6281 Muscle weakness (generalized): Secondary | ICD-10-CM | POA: Diagnosis not present

## 2023-07-14 DIAGNOSIS — R498 Other voice and resonance disorders: Secondary | ICD-10-CM | POA: Diagnosis not present

## 2023-07-14 DIAGNOSIS — E46 Unspecified protein-calorie malnutrition: Secondary | ICD-10-CM | POA: Diagnosis not present

## 2023-07-18 DIAGNOSIS — M6281 Muscle weakness (generalized): Secondary | ICD-10-CM | POA: Diagnosis not present

## 2023-07-18 DIAGNOSIS — G1221 Amyotrophic lateral sclerosis: Secondary | ICD-10-CM | POA: Diagnosis not present

## 2023-07-18 DIAGNOSIS — R1312 Dysphagia, oropharyngeal phase: Secondary | ICD-10-CM | POA: Diagnosis not present

## 2023-07-18 DIAGNOSIS — R471 Dysarthria and anarthria: Secondary | ICD-10-CM | POA: Diagnosis not present

## 2023-07-18 DIAGNOSIS — E46 Unspecified protein-calorie malnutrition: Secondary | ICD-10-CM | POA: Diagnosis not present

## 2023-07-18 DIAGNOSIS — R498 Other voice and resonance disorders: Secondary | ICD-10-CM | POA: Diagnosis not present

## 2023-07-19 DIAGNOSIS — R1312 Dysphagia, oropharyngeal phase: Secondary | ICD-10-CM | POA: Diagnosis not present

## 2023-07-19 DIAGNOSIS — R471 Dysarthria and anarthria: Secondary | ICD-10-CM | POA: Diagnosis not present

## 2023-07-19 DIAGNOSIS — R498 Other voice and resonance disorders: Secondary | ICD-10-CM | POA: Diagnosis not present

## 2023-07-19 DIAGNOSIS — G1221 Amyotrophic lateral sclerosis: Secondary | ICD-10-CM | POA: Diagnosis not present

## 2023-07-19 DIAGNOSIS — M6281 Muscle weakness (generalized): Secondary | ICD-10-CM | POA: Diagnosis not present

## 2023-07-19 DIAGNOSIS — E46 Unspecified protein-calorie malnutrition: Secondary | ICD-10-CM | POA: Diagnosis not present

## 2023-07-24 DIAGNOSIS — G1221 Amyotrophic lateral sclerosis: Secondary | ICD-10-CM | POA: Diagnosis not present

## 2023-07-24 DIAGNOSIS — R1310 Dysphagia, unspecified: Secondary | ICD-10-CM | POA: Diagnosis not present

## 2023-07-25 DIAGNOSIS — I10 Essential (primary) hypertension: Secondary | ICD-10-CM | POA: Diagnosis not present

## 2023-07-25 DIAGNOSIS — M419 Scoliosis, unspecified: Secondary | ICD-10-CM | POA: Diagnosis not present

## 2023-07-25 DIAGNOSIS — J984 Other disorders of lung: Secondary | ICD-10-CM | POA: Diagnosis not present

## 2023-07-25 DIAGNOSIS — R1310 Dysphagia, unspecified: Secondary | ICD-10-CM | POA: Diagnosis not present

## 2023-07-25 DIAGNOSIS — K219 Gastro-esophageal reflux disease without esophagitis: Secondary | ICD-10-CM | POA: Diagnosis not present

## 2023-07-25 DIAGNOSIS — G1221 Amyotrophic lateral sclerosis: Secondary | ICD-10-CM | POA: Diagnosis not present

## 2023-07-25 DIAGNOSIS — R059 Cough, unspecified: Secondary | ICD-10-CM | POA: Diagnosis not present

## 2023-07-25 DIAGNOSIS — Z9989 Dependence on other enabling machines and devices: Secondary | ICD-10-CM | POA: Diagnosis not present

## 2023-07-25 DIAGNOSIS — G709 Myoneural disorder, unspecified: Secondary | ICD-10-CM | POA: Diagnosis not present

## 2023-07-25 DIAGNOSIS — R1312 Dysphagia, oropharyngeal phase: Secondary | ICD-10-CM | POA: Diagnosis not present

## 2023-07-25 DIAGNOSIS — R471 Dysarthria and anarthria: Secondary | ICD-10-CM | POA: Diagnosis not present

## 2023-07-25 DIAGNOSIS — M6281 Muscle weakness (generalized): Secondary | ICD-10-CM | POA: Diagnosis not present

## 2023-07-26 DIAGNOSIS — R1312 Dysphagia, oropharyngeal phase: Secondary | ICD-10-CM | POA: Diagnosis not present

## 2023-07-26 DIAGNOSIS — M810 Age-related osteoporosis without current pathological fracture: Secondary | ICD-10-CM | POA: Diagnosis not present

## 2023-07-26 DIAGNOSIS — Z9989 Dependence on other enabling machines and devices: Secondary | ICD-10-CM | POA: Diagnosis not present

## 2023-07-26 DIAGNOSIS — R471 Dysarthria and anarthria: Secondary | ICD-10-CM | POA: Diagnosis not present

## 2023-07-26 DIAGNOSIS — E46 Unspecified protein-calorie malnutrition: Secondary | ICD-10-CM | POA: Diagnosis not present

## 2023-07-26 DIAGNOSIS — R498 Other voice and resonance disorders: Secondary | ICD-10-CM | POA: Diagnosis not present

## 2023-07-26 DIAGNOSIS — M6281 Muscle weakness (generalized): Secondary | ICD-10-CM | POA: Diagnosis not present

## 2023-07-26 DIAGNOSIS — Z681 Body mass index (BMI) 19 or less, adult: Secondary | ICD-10-CM | POA: Diagnosis not present

## 2023-07-26 DIAGNOSIS — G1221 Amyotrophic lateral sclerosis: Secondary | ICD-10-CM | POA: Diagnosis not present

## 2023-07-26 DIAGNOSIS — Z7951 Long term (current) use of inhaled steroids: Secondary | ICD-10-CM | POA: Diagnosis not present

## 2023-07-26 DIAGNOSIS — E039 Hypothyroidism, unspecified: Secondary | ICD-10-CM | POA: Diagnosis not present

## 2023-07-27 ENCOUNTER — Telehealth: Payer: Self-pay | Admitting: *Deleted

## 2023-07-27 DIAGNOSIS — Z01818 Encounter for other preprocedural examination: Secondary | ICD-10-CM

## 2023-07-27 DIAGNOSIS — M6281 Muscle weakness (generalized): Secondary | ICD-10-CM | POA: Diagnosis not present

## 2023-07-27 DIAGNOSIS — G1221 Amyotrophic lateral sclerosis: Secondary | ICD-10-CM | POA: Diagnosis not present

## 2023-07-27 DIAGNOSIS — R498 Other voice and resonance disorders: Secondary | ICD-10-CM | POA: Diagnosis not present

## 2023-07-27 DIAGNOSIS — R471 Dysarthria and anarthria: Secondary | ICD-10-CM | POA: Diagnosis not present

## 2023-07-27 DIAGNOSIS — E46 Unspecified protein-calorie malnutrition: Secondary | ICD-10-CM | POA: Diagnosis not present

## 2023-07-27 DIAGNOSIS — R1312 Dysphagia, oropharyngeal phase: Secondary | ICD-10-CM | POA: Diagnosis not present

## 2023-07-27 NOTE — Telephone Encounter (Signed)
Copied from CRM [id number]. Topic: Clinical - Request for Lab/Test Order >> Jul 27, 2023  3:44 PM Corin V wrote: Reason for CRM: Romeo Apple with [facility] called to ask if Dr. Fabian Sharp could order a BMP and CBC lab for the patient. She is having a feeding tube placed on Thursday 08/03/23 per neurology request and they need to have these labs done prior to placing the tube. They wanted it done through [facility] so that she does not have to travel back to [facility] for the labs. Romeo Apple will be faxing some paperwork regarding this over for Dr. Fabian Sharp as well. Please call Romeo Apple with additional questions. Patient son will be calling to set this lab up tomorrow if orders are placed by then.

## 2023-07-28 NOTE — Telephone Encounter (Signed)
Orders are placed.   Attempted to reach pt's son. Left a detail message that orders are placed. If have any questions, let us know.

## 2023-08-02 ENCOUNTER — Other Ambulatory Visit (INDEPENDENT_AMBULATORY_CARE_PROVIDER_SITE_OTHER): Payer: Medicare Other

## 2023-08-02 DIAGNOSIS — Z01818 Encounter for other preprocedural examination: Secondary | ICD-10-CM

## 2023-08-02 DIAGNOSIS — R498 Other voice and resonance disorders: Secondary | ICD-10-CM | POA: Diagnosis not present

## 2023-08-02 DIAGNOSIS — R471 Dysarthria and anarthria: Secondary | ICD-10-CM | POA: Diagnosis not present

## 2023-08-02 DIAGNOSIS — G1221 Amyotrophic lateral sclerosis: Secondary | ICD-10-CM | POA: Diagnosis not present

## 2023-08-02 DIAGNOSIS — R1312 Dysphagia, oropharyngeal phase: Secondary | ICD-10-CM | POA: Diagnosis not present

## 2023-08-02 DIAGNOSIS — E039 Hypothyroidism, unspecified: Secondary | ICD-10-CM | POA: Diagnosis not present

## 2023-08-02 DIAGNOSIS — M6281 Muscle weakness (generalized): Secondary | ICD-10-CM | POA: Diagnosis not present

## 2023-08-02 DIAGNOSIS — E46 Unspecified protein-calorie malnutrition: Secondary | ICD-10-CM | POA: Diagnosis not present

## 2023-08-02 LAB — CBC WITH DIFFERENTIAL/PLATELET
Basophils Absolute: 0 10*3/uL (ref 0.0–0.1)
Basophils Relative: 0.3 % (ref 0.0–3.0)
Eosinophils Absolute: 0.1 10*3/uL (ref 0.0–0.7)
Eosinophils Relative: 1.3 % (ref 0.0–5.0)
HCT: 41.6 % (ref 36.0–46.0)
Hemoglobin: 13.6 g/dL (ref 12.0–15.0)
Lymphocytes Relative: 16.8 % (ref 12.0–46.0)
Lymphs Abs: 1.5 10*3/uL (ref 0.7–4.0)
MCHC: 32.6 g/dL (ref 30.0–36.0)
MCV: 100.6 fL — ABNORMAL HIGH (ref 78.0–100.0)
Monocytes Absolute: 0.7 10*3/uL (ref 0.1–1.0)
Monocytes Relative: 7.5 % (ref 3.0–12.0)
Neutro Abs: 6.5 10*3/uL (ref 1.4–7.7)
Neutrophils Relative %: 74.1 % (ref 43.0–77.0)
Platelets: 309 10*3/uL (ref 150.0–400.0)
RBC: 4.13 Mil/uL (ref 3.87–5.11)
RDW: 18 % — ABNORMAL HIGH (ref 11.5–15.5)
WBC: 8.8 10*3/uL (ref 4.0–10.5)

## 2023-08-02 LAB — BASIC METABOLIC PANEL
BUN: 16 mg/dL (ref 6–23)
CO2: 33 meq/L — ABNORMAL HIGH (ref 19–32)
Calcium: 9.8 mg/dL (ref 8.4–10.5)
Chloride: 103 meq/L (ref 96–112)
Creatinine, Ser: 0.47 mg/dL (ref 0.40–1.20)
GFR: 88.93 mL/min (ref >60.00)
Glucose, Bld: 107 mg/dL — ABNORMAL HIGH (ref 70–99)
Potassium: 3.9 meq/L (ref 3.5–5.1)
Sodium: 143 meq/L (ref 135–145)

## 2023-08-02 LAB — TSH: TSH: 3.04 u[IU]/mL (ref 0.35–5.50)

## 2023-08-02 NOTE — Telephone Encounter (Signed)
Spoke to son, Latessa Tillis, to follow up.   He reports pt is having the procedure tomorrow and needs to get the lab today.   Schedule a lab appt for this afternoon at 2pm.

## 2023-08-03 DIAGNOSIS — G1221 Amyotrophic lateral sclerosis: Secondary | ICD-10-CM | POA: Diagnosis not present

## 2023-08-03 DIAGNOSIS — Z431 Encounter for attention to gastrostomy: Secondary | ICD-10-CM | POA: Diagnosis not present

## 2023-08-03 DIAGNOSIS — R4702 Dysphasia: Secondary | ICD-10-CM | POA: Diagnosis not present

## 2023-08-04 ENCOUNTER — Encounter: Payer: Self-pay | Admitting: Family Medicine

## 2023-08-07 DIAGNOSIS — R1312 Dysphagia, oropharyngeal phase: Secondary | ICD-10-CM | POA: Diagnosis not present

## 2023-08-07 DIAGNOSIS — R498 Other voice and resonance disorders: Secondary | ICD-10-CM | POA: Diagnosis not present

## 2023-08-07 DIAGNOSIS — M6281 Muscle weakness (generalized): Secondary | ICD-10-CM | POA: Diagnosis not present

## 2023-08-07 DIAGNOSIS — G1221 Amyotrophic lateral sclerosis: Secondary | ICD-10-CM | POA: Diagnosis not present

## 2023-08-07 DIAGNOSIS — E46 Unspecified protein-calorie malnutrition: Secondary | ICD-10-CM | POA: Diagnosis not present

## 2023-08-07 DIAGNOSIS — R471 Dysarthria and anarthria: Secondary | ICD-10-CM | POA: Diagnosis not present

## 2023-08-08 ENCOUNTER — Other Ambulatory Visit (HOSPITAL_COMMUNITY): Payer: Self-pay

## 2023-08-08 ENCOUNTER — Telehealth (HOSPITAL_COMMUNITY): Payer: Self-pay | Admitting: Pharmacy Technician

## 2023-08-08 NOTE — Telephone Encounter (Signed)
Pharmacy Patient Advocate Encounter   Received notification from  [employer]  that prior authorization for KATERZIA 1MG /ML SUSPENSION is required/requested.   Insurance verification completed.   The patient is insured through [employer] .   Per test claim: PA required; PA submitted to above mentioned insurance via CoverMyMeds Key/confirmation #/EOC [id number] Status is pending

## 2023-08-09 DIAGNOSIS — R1312 Dysphagia, oropharyngeal phase: Secondary | ICD-10-CM | POA: Diagnosis not present

## 2023-08-09 DIAGNOSIS — E46 Unspecified protein-calorie malnutrition: Secondary | ICD-10-CM | POA: Diagnosis not present

## 2023-08-09 DIAGNOSIS — G1221 Amyotrophic lateral sclerosis: Secondary | ICD-10-CM | POA: Diagnosis not present

## 2023-08-09 DIAGNOSIS — M6281 Muscle weakness (generalized): Secondary | ICD-10-CM | POA: Diagnosis not present

## 2023-08-09 DIAGNOSIS — R498 Other voice and resonance disorders: Secondary | ICD-10-CM | POA: Diagnosis not present

## 2023-08-09 DIAGNOSIS — R471 Dysarthria and anarthria: Secondary | ICD-10-CM | POA: Diagnosis not present

## 2023-08-10 ENCOUNTER — Other Ambulatory Visit (HOSPITAL_COMMUNITY): Payer: Self-pay

## 2023-08-10 NOTE — Telephone Encounter (Signed)
Pharmacy Patient Advocate Encounter  Received notification from [facility] that Prior Authorization for [facility] 1MG /ML SUSPENSION  has been APPROVED from 08/09/2023 to 06/26/2024. Ran test claim, Copay is $0.00. This test claim was processed through [facility]- copay amounts may vary at other pharmacies due to pharmacy/plan contracts, or as the patient moves through the different stages of their insurance plan.   PA #/Case ID/Reference #: [id number] KEY# [id number]

## 2023-08-17 DIAGNOSIS — E46 Unspecified protein-calorie malnutrition: Secondary | ICD-10-CM | POA: Diagnosis not present

## 2023-08-17 DIAGNOSIS — G1221 Amyotrophic lateral sclerosis: Secondary | ICD-10-CM | POA: Diagnosis not present

## 2023-08-17 DIAGNOSIS — M6281 Muscle weakness (generalized): Secondary | ICD-10-CM | POA: Diagnosis not present

## 2023-08-17 DIAGNOSIS — R1312 Dysphagia, oropharyngeal phase: Secondary | ICD-10-CM | POA: Diagnosis not present

## 2023-08-17 DIAGNOSIS — R471 Dysarthria and anarthria: Secondary | ICD-10-CM | POA: Diagnosis not present

## 2023-08-17 DIAGNOSIS — R498 Other voice and resonance disorders: Secondary | ICD-10-CM | POA: Diagnosis not present

## 2023-08-23 DIAGNOSIS — E46 Unspecified protein-calorie malnutrition: Secondary | ICD-10-CM | POA: Diagnosis not present

## 2023-08-23 DIAGNOSIS — R498 Other voice and resonance disorders: Secondary | ICD-10-CM | POA: Diagnosis not present

## 2023-08-23 DIAGNOSIS — G1221 Amyotrophic lateral sclerosis: Secondary | ICD-10-CM | POA: Diagnosis not present

## 2023-08-23 DIAGNOSIS — R471 Dysarthria and anarthria: Secondary | ICD-10-CM | POA: Diagnosis not present

## 2023-08-23 DIAGNOSIS — M6281 Muscle weakness (generalized): Secondary | ICD-10-CM | POA: Diagnosis not present

## 2023-08-23 DIAGNOSIS — R1312 Dysphagia, oropharyngeal phase: Secondary | ICD-10-CM | POA: Diagnosis not present

## 2023-08-30 ENCOUNTER — Encounter: Payer: Self-pay | Admitting: Family Medicine

## 2023-08-30 ENCOUNTER — Ambulatory Visit: Admitting: Family Medicine

## 2023-08-30 VITALS — BP 128/70 | HR 100 | Resp 16 | Ht 60.0 in | Wt 99.1 lb

## 2023-08-30 DIAGNOSIS — L03311 Cellulitis of abdominal wall: Secondary | ICD-10-CM | POA: Diagnosis not present

## 2023-08-30 DIAGNOSIS — Z789 Other specified health status: Secondary | ICD-10-CM | POA: Diagnosis not present

## 2023-08-30 MED ORDER — MUPIROCIN 2 % EX OINT: 1.0000 | TOPICAL_OINTMENT | Freq: Three times a day (TID) | CUTANEOUS | 0 refills | Status: AC

## 2023-08-30 MED ORDER — DOXYCYCLINE HYCLATE 100 MG PO TABS: 100.0000 mg | ORAL_TABLET | Freq: Two times a day (BID) | ORAL | 0 refills | Status: AC

## 2023-08-30 NOTE — Progress Notes (Unsigned)
 ACUTE VISIT Chief Complaint  Patient presents with   Wound Check   HPI: [name] is a 83 y.o. female with a PMHx significant for GERD, hypothyroidism, osteoporosis, ALS, and chronic back pain, who is here today with her son complaining of a problem with her feeding tube.   Patient's son states she had her feeding tube placed on 2/6. Yesterday, she noticed some irritation around the tube.  Son communicated with the infusion team and was advised that the T-fastener was meant to be removed.  She has not had any postoperative follow up appointment.  No pain or drainage from the area around the tube.  She ahs not used OTC treatments.  She had had some left lower abdominal pain, which her son believes is related to constipation.   Negative for fever,chills, MS changes, urinary symptoms.  Review of Systems  Constitutional:  Positive for fatigue. Negative for activity change, chills and fever.  Respiratory:  Negative for cough and wheezing.   Cardiovascular:  Negative for chest pain.  Gastrointestinal:  Negative for nausea and vomiting.  Genitourinary:  Negative for decreased urine volume, dysuria and hematuria.  Neurological:  Negative for syncope.  Psychiatric/Behavioral:  Negative for confusion and hallucinations.   See other pertinent positives and negatives in HPI.  Current Outpatient Medications on File Prior to Visit  Medication Sig Dispense Refill   albuterol (VENTOLIN HFA) 108 (90 Base) MCG/ACT inhaler SMARTSIG:2 Inhalation Via Inhaler Every 6 Hours PRN     amLODIPine (KATERZIA) 1 mg/mL SUSP oral suspension Take 2.5 mLs (2.5 mg total) by mouth daily. 75 mL 2   atropine 1 % ophthalmic solution For SUBLINGUAL use ONLY. Apply one drop sublingually every 4-6 hours for oral secretions as need while awake. Please do not apply to eyes.     budesonide-formoterol (SYMBICORT) 80-4.5 MCG/ACT inhaler INHALE 2 PUFFS INTO THE LUNGS TWICE DAILY 10.2 g 1   denosumab (PROLIA) 60 MG/ML  SOLN injection Inject 60 mg into the skin every 6 (six) months. Administer in upper arm, thigh, or abdomen     furosemide (LASIX) 20 MG tablet Take 1 tablet (20 mg total) by mouth daily. 5 tablet 0   Levothyroxine Sodium 112 MCG/ML SOLN Take 0.9 mLs by mouth daily. 30 mL 2   nystatin (MYCOSTATIN) 100000 UNIT/ML suspension Take 5 mLs (500,000 Units total) by mouth in the morning, at noon, and at bedtime. 473 mL 0   pantoprazole sodium (PROTONIX) 40 mg Take 20 mg by mouth daily. 30 packet 1   riluzole (RILUTEK) 50 MG tablet Take 50 mg by mouth. Has not pick up yet.     traZODone (DESYREL) 50 MG tablet Take 50 mg by mouth. Taking 1/2 tab.     No current facility-administered medications on file prior to visit.    Past Medical History:  Diagnosis Date   COVID 10/25/2020   Dog bite of thigh 04/09/2012   Elevated lipids    GERD (gastroesophageal reflux disease)    Hypertension    Hypothyroidism    post thyroidectomy   Osteoarthritis    Osteopenia    t -2.5 hip in 2005 by Dexa   Osteoporosis    commpr fx after dog related fall  injury   Seasonal rhinitis    spring and fall   Skin cancer    squamous cell ca chest   Vaginal prolapse    hs corrected   Allergies  Allergen Reactions   Capsicum Annuum Extract & Derivative (Bell Pepper) [Capsicum]  Other reaction(s): Other (See Comments)   Milk-Related Compounds Other (See Comments)   Onion Other (See Comments)   Other Other (See Comments)    [name] [name]    Social History   Socioeconomic History   Marital status: Widowed    Spouse name: Not on file   Number of children: 1   Years of education: Not on file   Highest education level: Associate degree: academic program  Occupational History   Occupation: retired  Tobacco Use   Smoking status: Never   Smokeless tobacco: Never  Vaping Use   Vaping status: Never Used  Substance and Sexual Activity   Alcohol use: Yes    Comment: occasionally   Drug use: No   Sexual  activity: Not Currently    Partners: Male    Birth control/protection: Surgical    Comment: hysterectomy, older than 16, less than 5  Other Topics Concern   Not on file  Social History Narrative   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   Regular Exercise- yes      1 son, never smoker no alcohol 1 caffeinated beverage   Social Drivers of Corporate investment banker Strain: Low Risk  (10/29/2022)   [facility]   Overall Financial Resource Strain (CARDIA)    Difficulty of Paying Living Expenses: Not hard at all  Food Insecurity: No Food Insecurity (03/01/2023)   Received from [facility]   Hunger Vital Sign    Worried About Running Out of Food in the Last Year: Never true    Ran Out of Food in the Last Year: Never true  Transportation Needs: No Transportation Needs (03/01/2023)   Received from [facility]    In the past 12 months, has lack of transportation kept you from medical appointments or from getting medications?: No    Lack of Transportation (Non-Medical): No  Physical Activity: Insufficiently Active (05/24/2022)   Exercise Vital Sign    Days of Exercise per Week: 5 days    Minutes of Exercise per Session: 20 min  Stress: No Stress Concern Present (05/24/2022)   [employer] of Occupational Health - Occupational Stress Questionnaire    Feeling of Stress : Not at all  Social Connections: Unknown (03/25/2023)   Received from [facility]   Social Network    Social Network: Not on file   Vitals:   08/30/23 1409  BP: 128/70  Pulse: 100  Resp: 16  SpO2: 95%   Body mass index is 19.36 kg/m.  Physical Exam Vitals and nursing note reviewed.  Constitutional:      General: She is not in acute distress.    Appearance: She is well-developed.  HENT:     Head: Normocephalic and atraumatic.  Eyes:      Conjunctiva/sclera: Conjunctivae normal.  Cardiovascular:     Rate and Rhythm: Normal rate and regular rhythm.     Heart sounds: No murmur heard. Pulmonary:     Effort: Pulmonary effort is normal. No respiratory distress.     Breath sounds: Normal breath sounds.  Abdominal:     Palpations: Abdomen is soft. There is no mass.     Tenderness: There is no abdominal tenderness.    Skin:    General: Skin is warm.     Findings: Erythema present. No rash.     Comments: See abdomen.  Neurological:  Mental Status: She is alert and oriented to person, place, and time.     Comments: Unstable gait assisted with a walker.  Psychiatric:        Mood and Affect: Mood and affect normal.    ASSESSMENT AND PLAN:  Ms. Weisel was seen today for a problem with her feeding tube.   Abdominal wall cellulitis -     Doxycycline Hyclate; Place 1 tablet (100 mg total) into feeding tube 2 (two) times daily for 7 days.  Dispense: 14 tablet; Refill: 0 -     Mupirocin; Apply 1 Application topically 3 (three) times daily for 10 days.  Dispense: 30 g; Refill: 0  On tube feeding diet  Around and underneath right T-fastener there is erythema, irritation vs mild cellulitis. Her son was communicating with surgeon's office to see if T-fastener can be removed in their office, I do nit feel comfortable doing so. G-tube is functioning well. Abdominal discomfort attributed to episode of constipation and improved.  Will treat with abx per tube and topical. Continue keeping area clean. Instructed about warning signs.  I spent a total of 31 minutes in both face to face and non face to face activities for this visit on the date of this encounter. During this time history was obtained and documented, examination was performed, and assessment/plan discussed.  Return if symptoms worsen or fail to improve.  I, Rolla Etienne Wierda, acting as a scribe for Miosha Behe [place], MD., have documented all relevant documentation on the  behalf of Caron Tardif [place], MD, as directed by  Denitra Donaghey [place], MD while in the presence of Jaxden Blyden [place], MD.   I, Braeleigh Pyper [place], MD, have reviewed all documentation for this visit. The documentation on 08/31/23 for the exam, diagnosis, procedures, and orders are all accurate and complete.  Lanya Bucks G. [place], MD  [facility]. [place] office.

## 2023-08-30 NOTE — Patient Instructions (Signed)
 A few things to remember from today's visit:  Abdominal wall cellulitis - Plan: doxycycline (VIBRA-TABS) 100 MG tablet, mupirocin ointment (BACTROBAN) 2 %  Keep area clean. Antibiotic ointment 2-3 times per day for 10 days and tab per tube. Follow with surgeon who placed tube for instructions about how to remove piece that is causing irrigation.  If you need refills for medications you take chronically, please call your pharmacy. Do not use My Chart to request refills or for acute issues that need immediate attention. If you send a my chart message, it may take a few days to be addressed, specially if I am not in the office.  Please be sure medication list is accurate. If a new problem present, please set up appointment sooner than planned today.

## 2023-09-12 DIAGNOSIS — J9612 Chronic respiratory failure with hypercapnia: Secondary | ICD-10-CM | POA: Diagnosis not present

## 2023-09-12 DIAGNOSIS — G709 Myoneural disorder, unspecified: Secondary | ICD-10-CM | POA: Diagnosis not present

## 2023-09-12 DIAGNOSIS — J9610 Chronic respiratory failure, unspecified whether with hypoxia or hypercapnia: Secondary | ICD-10-CM | POA: Diagnosis not present

## 2023-09-12 DIAGNOSIS — J984 Other disorders of lung: Secondary | ICD-10-CM | POA: Diagnosis not present

## 2023-09-12 DIAGNOSIS — G1221 Amyotrophic lateral sclerosis: Secondary | ICD-10-CM | POA: Diagnosis not present

## 2023-09-12 DIAGNOSIS — Z9989 Dependence on other enabling machines and devices: Secondary | ICD-10-CM | POA: Diagnosis not present

## 2023-09-12 DIAGNOSIS — K117 Disturbances of salivary secretion: Secondary | ICD-10-CM | POA: Diagnosis not present

## 2023-09-12 DIAGNOSIS — M6281 Muscle weakness (generalized): Secondary | ICD-10-CM | POA: Diagnosis not present

## 2023-10-02 ENCOUNTER — Encounter: Payer: Self-pay | Admitting: Family Medicine

## 2023-10-04 MED ORDER — AMLODIPINE BESYLATE 2.5 MG PO TABS: 2.5000 mg | ORAL_TABLET | Freq: Every day | ORAL | 1 refills | Status: AC

## 2023-10-06 ENCOUNTER — Telehealth: Payer: Self-pay

## 2023-10-06 NOTE — Telephone Encounter (Signed)
 Follow up with [facility] on Outside Lab Request form to fax over completed lab results.   Spoke to [place], [place]. He states they received pt's lab cbc and cmp from February and that pt should be good.   No further action is needed.

## 2023-11-24 DIAGNOSIS — F430 Acute stress reaction: Secondary | ICD-10-CM | POA: Diagnosis not present

## 2023-11-24 DIAGNOSIS — R4589 Other symptoms and signs involving emotional state: Secondary | ICD-10-CM | POA: Diagnosis not present

## 2023-11-24 DIAGNOSIS — F411 Generalized anxiety disorder: Secondary | ICD-10-CM | POA: Diagnosis not present

## 2023-12-03 ENCOUNTER — Emergency Department (HOSPITAL_COMMUNITY)

## 2023-12-03 ENCOUNTER — Emergency Department (HOSPITAL_COMMUNITY)
Admission: EM | Admit: 2023-12-03 | Discharge: 2023-12-03 | Disposition: A | Discharge status: Home / Self Care | Attending: Emergency Medicine | Admitting: Emergency Medicine

## 2023-12-03 ENCOUNTER — Encounter (HOSPITAL_COMMUNITY): Payer: Self-pay

## 2023-12-03 DIAGNOSIS — Z85828 Personal history of other malignant neoplasm of skin: Secondary | ICD-10-CM | POA: Diagnosis not present

## 2023-12-03 DIAGNOSIS — K9423 Gastrostomy malfunction: Secondary | ICD-10-CM | POA: Diagnosis not present

## 2023-12-03 DIAGNOSIS — Z8616 Personal history of COVID-19: Secondary | ICD-10-CM | POA: Insufficient documentation

## 2023-12-03 DIAGNOSIS — I10 Essential (primary) hypertension: Secondary | ICD-10-CM | POA: Diagnosis not present

## 2023-12-03 DIAGNOSIS — K9429 Other complications of gastrostomy: Secondary | ICD-10-CM | POA: Diagnosis not present

## 2023-12-03 DIAGNOSIS — R600 Localized edema: Secondary | ICD-10-CM | POA: Diagnosis not present

## 2023-12-03 DIAGNOSIS — Z79899 Other long term (current) drug therapy: Secondary | ICD-10-CM | POA: Insufficient documentation

## 2023-12-03 DIAGNOSIS — E039 Hypothyroidism, unspecified: Secondary | ICD-10-CM | POA: Insufficient documentation

## 2023-12-03 DIAGNOSIS — J984 Other disorders of lung: Secondary | ICD-10-CM | POA: Diagnosis not present

## 2023-12-03 DIAGNOSIS — Z931 Gastrostomy status: Secondary | ICD-10-CM | POA: Diagnosis not present

## 2023-12-03 DIAGNOSIS — E871 Hypo-osmolality and hyponatremia: Secondary | ICD-10-CM | POA: Diagnosis not present

## 2023-12-03 LAB — CBC WITH DIFFERENTIAL/PLATELET
Abs Immature Granulocytes: 0.07 10*3/uL (ref 0.00–0.07)
Basophils Absolute: 0 10*3/uL (ref 0.0–0.1)
Basophils Relative: 1 %
Eosinophils Absolute: 0 10*3/uL (ref 0.0–0.5)
Eosinophils Relative: 0 %
HCT: 38.6 % (ref 36.0–46.0)
Hemoglobin: 13.1 g/dL (ref 12.0–15.0)
Immature Granulocytes: 1 %
Lymphocytes Relative: 16 %
Lymphs Abs: 0.9 10*3/uL (ref 0.7–4.0)
MCH: 33.4 pg (ref 26.0–34.0)
MCHC: 33.9 g/dL (ref 30.0–36.0)
MCV: 98.5 fL (ref 80.0–100.0)
Monocytes Absolute: 0.8 10*3/uL (ref 0.1–1.0)
Monocytes Relative: 15 %
Neutro Abs: 3.5 10*3/uL (ref 1.7–7.7)
Neutrophils Relative %: 67 %
Platelets: 318 10*3/uL (ref 150–400)
RBC: 3.92 MIL/uL (ref 3.87–5.11)
RDW: 15.3 % (ref 11.5–15.5)
WBC: 5.3 10*3/uL (ref 4.0–10.5)
nRBC: 0 % (ref 0.0–0.2)

## 2023-12-03 LAB — BASIC METABOLIC PANEL WITH GFR
Anion gap: 12 (ref 5–15)
BUN: 18 mg/dL (ref 8–23)
CO2: 29 mmol/L (ref 22–32)
Calcium: 9.3 mg/dL (ref 8.9–10.3)
Chloride: 82 mmol/L — ABNORMAL LOW (ref 98–111)
Creatinine, Ser: <0.3 mg/dL — ABNORMAL LOW (ref 0.44–1.00)
Glucose, Bld: 104 mg/dL — ABNORMAL HIGH (ref 70–99)
Potassium: 5 mmol/L (ref 3.5–5.1)
Sodium: 123 mmol/L — ABNORMAL LOW (ref 135–145)

## 2023-12-03 LAB — BRAIN NATRIURETIC PEPTIDE: B Natriuretic Peptide: 63.7 pg/mL (ref 0.0–100.0)

## 2023-12-03 NOTE — ED Triage Notes (Signed)
 Patient is an ALS patient, mostly non-verbal. Gtube became clogged. Son is here with patient and attempted to unclog it at home with no success. Patients last feeding was this morning, however she has not received her medication due to the tube being clogged. Son reports normal bowel movement. Patient is on a continuous Bipap. Gtube was placed in February by [facility].

## 2023-12-03 NOTE — Discharge Instructions (Signed)
 We replaced your G-tube.  It is now ready to use.  Your sodium today was 123, your potassium was 5.0.  Begin to double your Lasix  dose to 20 mg each day for the next 5 days.  Please follow-up with your ALS team and PCP.  I would recommend following up with your PCP within 1 week to have repeat blood work.

## 2023-12-03 NOTE — ED Provider Notes (Signed)
 [place] EMERGENCY DEPARTMENT AT [facility] Provider Note  CSN: [id number] Arrival date & time: 12/03/23 1936  Chief Complaint(s) No chief complaint on file.  HPI [name] [name] is a 83 y.o. female who has a history of ALS.  Comes in today for a clogged G-tube.  At home, they tried multiple things to try to clear the clot but were unable to do so.  Patient is here with her son who is her primary caregiver, he also reports he is not concerned about her fluid status as she has had some increasing edema in her legs.  Patient takes Lasix  daily, there is been a discussion about increasing her Lasix .   Past Medical History Past Medical History:  Diagnosis Date   COVID 10/25/2020   Dog bite of thigh 04/09/2012   Elevated lipids    GERD (gastroesophageal reflux disease)    Hypertension    Hypothyroidism    post thyroidectomy   Osteoarthritis    Osteopenia    t -2.5 hip in 2005 by Dexa   Osteoporosis    commpr fx after dog related fall  injury   Seasonal rhinitis    spring and fall   Skin cancer    squamous cell ca chest   Vaginal prolapse    hs corrected   Patient Active Problem List   Diagnosis Date Noted   Acute cough 07/14/2022   Hoarseness of voice 07/14/2022   GERD (gastroesophageal reflux disease) 07/14/2022   Medication management 10/16/2013   Medicare annual wellness visit, subsequent 07/16/2012   Heart palpitations 24-May-2011   Family history of sudden death May 24, 2011   PALPITATIONS 03/31/2010   BACK PAIN, LUMBAR 10/13/2009   HEARTBURN 10/13/2009   Hypothyroidism 01/25/2007   Osteoporosis 01/25/2007   OSTEOPENIA 01/25/2007   Home Medication(s) Prior to Admission medications   Medication Sig Start Date End Date Taking? Authorizing Provider  albuterol (VENTOLIN HFA) 108 (90 Base) MCG/ACT inhaler SMARTSIG:2 Inhalation Via Inhaler Every 6 Hours PRN 11/10/22   [provider]  amLODIPine  (KATERZIA) 1 mg/mL SUSP oral suspension Take 2.5 mLs  (2.5 mg total) by mouth daily. 07/12/23   [place], Drew G, MD  amLODipine  (NORVASC ) 2.5 MG tablet Take 1 tablet (2.5 mg total) by mouth daily. 10/04/23   Panosh, Wanda K, MD  atropine 1 % ophthalmic solution For SUBLINGUAL use ONLY. Apply one drop sublingually every 4-6 hours for oral secretions as need while awake. Please do not apply to eyes. 04/06/23   [provider]  budesonide -formoterol  (SYMBICORT ) 80-4.5 MCG/ACT inhaler INHALE 2 PUFFS INTO THE LUNGS TWICE DAILY 03/03/23   Webb, Padonda B, FNP  denosumab  (PROLIA ) 60 MG/ML SOLN injection Inject 60 mg into the skin every 6 (six) months. Administer in upper arm, thigh, or abdomen    [provider]  furosemide  (LASIX ) 20 MG tablet Take 1 tablet (20 mg total) by mouth daily. 05/17/23   Panosh, Wanda K, MD  Levothyroxine  Sodium 112 MCG/ML SOLN Take 0.9 mLs by mouth daily. 07/12/23   [place], Halee G, MD  nystatin  (MYCOSTATIN ) 100000 UNIT/ML suspension Take 5 mLs (500,000 Units total) by mouth in the morning, at noon, and at bedtime. 07/12/23   [place], Fable G, MD  pantoprazole  sodium (PROTONIX ) 40 mg Take 20 mg by mouth daily. 07/12/23   [place], Jelisha G, MD  riluzole (RILUTEK) 50 MG tablet Take 50 mg by mouth. Has not pick up yet. 05/02/23   [provider]  traZODone (DESYREL) 50 MG tablet Take 50 mg  by mouth. Taking 1/2 tab. 04/13/23   [provider]                                                                                                                                    Past Surgical History Past Surgical History:  Procedure Laterality Date   ABDOMINAL HYSTERECTOMY  07/2007   bladders sling surgery  07/2007   COLONOSCOPY  2006   THYROIDECTOMY     Family History Family History  Problem Relation Age of Onset   Heart attack Mother    Alcohol abuse Mother    Kidney cancer Father    Hypertension Maternal Grandmother    Esophageal cancer Neg Hx    Colon cancer Neg Hx    Pancreatic cancer Neg Hx    Stomach  cancer Neg Hx     Social History Social History   Tobacco Use   Smoking status: Never   Smokeless tobacco: Never  Vaping Use   Vaping status: Never Used  Substance Use Topics   Alcohol use: Yes    Comment: occasionally   Drug use: No   Allergies Capsicum annuum extract & derivative (bell pepper) [capsicum], Milk-related compounds, Onion, and Other  Review of Systems Review of Systems  Physical Exam Vital Signs  I have reviewed the triage vital signs BP (!) 111/55   Pulse 83   Temp 98.1 F (36.7 C)   Resp 20   Wt 50.8 kg   SpO2 96%   BMI 21.87 kg/m   Physical Exam Vitals reviewed.  Constitutional:      Comments: Frail  HENT:     Head: Normocephalic and atraumatic.  Pulmonary:     Comments: Patient on home positive pressure ventilation via facemask. Abdominal:     General: Abdomen is flat. There is no distension.     Palpations: Abdomen is soft.     Tenderness: There is no abdominal tenderness.     Comments: Well-appearing G-tube site  Neurological:     Mental Status: She is alert. Mental status is at baseline.     ED Results and Treatments Labs (all labs ordered are listed, but only abnormal results are displayed) Labs Reviewed  BASIC METABOLIC PANEL WITH GFR - Abnormal; Notable for the following components:      Result Value   Sodium 123 (*)    Chloride 82 (*)    Glucose, Bld 104 (*)    Creatinine, Ser <0.30 (*)    All other components within normal limits  CBC WITH DIFFERENTIAL/PLATELET  BRAIN NATRIURETIC PEPTIDE  Radiology DG Abd 1 View Result Date: 12/03/2023 CLINICAL DATA:  Check gastrostomy placement EXAM: ABDOMEN - 1 VIEW COMPARISON:  None Available. FINDINGS: Indwelling gastrostomy catheter has been injected with free flow contrast into the stomach and subsequently into the proximal small bowel. No other focal abnormality  is seen. IMPRESSION: Gastrostomy catheter within the stomach. Electronically Signed   By: Violeta Grey M.D.   On: 12/03/2023 21:18   DG Chest Portable 1 View Result Date: 12/03/2023 CLINICAL DATA:  Recent gastrostomy replacement EXAM: PORTABLE CHEST 1 VIEW COMPARISON:  12/20/2018 FINDINGS: Cardiac shadow is stable. Lungs are hypoinflated but clear with the exception of minimal basilar scarring. No bony abnormality is noted. IMPRESSION: Minimal bibasilar scarring. Electronically Signed   By: Violeta Grey M.D.   On: 12/03/2023 21:17    Pertinent labs & imaging results that were available during my care of the patient were reviewed by me and considered in my medical decision making (see MDM for details).  Medications Ordered in ED Medications - No data to display                                                                                                                                   Procedures .Gastrostomy tube replacement  Date/Time: 12/03/2023 10:26 PM  Performed by: Nathanael Baker, DO Authorized by: Nathanael Baker, DO  Consent: Verbal consent obtained. Risks and benefits: risks, benefits and alternatives were discussed Consent given by: patient Patient understanding: patient states understanding of the procedure being performed Patient identity confirmed: verbally with patient Local anesthesia used: no  Anesthesia: Local anesthesia used: no  Sedation: Patient sedated: no      (including critical care time)  Medical Decision Making / ED Course   This patient presents to the ED for concern of G-tube malfunction edema, this involves an extensive number of treatment options, and is a complaint that carries with it a high risk of complications and morbidity.  The differential diagnosis includes G-tube malfunction, edema, hyponatremia.  MDM: I was able to replace patient's G-tube, confirmed on x-ray.  At the request the patient's son, did obtain a chest x-ray which was  normal per my independent review.  Patient without any significant lower extremity edema, however the son reports that this is increased from previous.  Obtain blood work outpatient which showed hyponatremia.  I discussion with the patient, the patient does not want to stay in the hospital, with her advanced ALS, she is prioritizing the time at home.  She has multiple specialist that she sees regularly.  They have Lasix  at home, do believe that her sodium is due to an increase in her free water.  She is going to double her Lasix  dose for the next 5 days and have repeat blood work done.  Both the patient and the son were agreeable with this plan.   Additional history obtained: -Additional history obtained from son at bedside -External  records from outside source obtained and reviewed including: Chart review including previous notes, labs, imaging, consultation notes   Lab Tests: -I ordered, reviewed, and interpreted labs.   The pertinent results include:   Labs Reviewed  BASIC METABOLIC PANEL WITH GFR - Abnormal; Notable for the following components:      Result Value   Sodium 123 (*)    Chloride 82 (*)    Glucose, Bld 104 (*)    Creatinine, Ser <0.30 (*)    All other components within normal limits  CBC WITH DIFFERENTIAL/PLATELET  BRAIN NATRIURETIC PEPTIDE     Imaging Studies ordered: I ordered imaging studies including chest x-ray, abdominal x-ray I independently visualized and interpreted imaging. I agree with the radiologist interpretation   Medicines ordered and prescription drug management: No orders of the defined types were placed in this encounter.   -I have reviewed the patients home medicines and have made adjustments as needed  Cardiac Monitoring: The patient was maintained on a cardiac monitor.  I personally viewed and interpreted the cardiac monitored which showed an underlying rhythm of: Normal sinus rhythm  Social Determinants of Health:  Factors impacting  patients care include: Medical comorbidities including advanced ALS.   Reevaluation: After the interventions noted above, I reevaluated the patient and found that they have :improved  Co morbidities that complicate the patient evaluation  Past Medical History:  Diagnosis Date   COVID 10/25/2020   Dog bite of thigh 04/09/2012   Elevated lipids    GERD (gastroesophageal reflux disease)    Hypertension    Hypothyroidism    post thyroidectomy   Osteoarthritis    Osteopenia    t -2.5 hip in 2005 by Dexa   Osteoporosis    commpr fx after dog related fall  injury   Seasonal rhinitis    spring and fall   Skin cancer    squamous cell ca chest   Vaginal prolapse    hs corrected      Dispostion: I considered admission for this patient, however the patient wishes to go home, and with her progressive health condition I believe this is reasonable.     Final Clinical Impression(s) / ED Diagnoses Final diagnoses:  Hyponatremia  Gastrostomy tube dysfunction (HCC)     @PCDICTATION @    Afton Horse T, DO 12/03/23 2226

## 2023-12-04 DIAGNOSIS — Z931 Gastrostomy status: Secondary | ICD-10-CM | POA: Diagnosis not present

## 2023-12-04 DIAGNOSIS — Z9989 Dependence on other enabling machines and devices: Secondary | ICD-10-CM | POA: Diagnosis not present

## 2023-12-04 DIAGNOSIS — F064 Anxiety disorder due to known physiological condition: Secondary | ICD-10-CM | POA: Diagnosis not present

## 2023-12-04 DIAGNOSIS — K117 Disturbances of salivary secretion: Secondary | ICD-10-CM | POA: Diagnosis not present

## 2023-12-04 DIAGNOSIS — J9612 Chronic respiratory failure with hypercapnia: Secondary | ICD-10-CM | POA: Diagnosis not present

## 2023-12-04 DIAGNOSIS — Z79899 Other long term (current) drug therapy: Secondary | ICD-10-CM | POA: Diagnosis not present

## 2023-12-04 DIAGNOSIS — E871 Hypo-osmolality and hyponatremia: Secondary | ICD-10-CM | POA: Diagnosis not present

## 2023-12-04 DIAGNOSIS — G1221 Amyotrophic lateral sclerosis: Secondary | ICD-10-CM | POA: Diagnosis not present

## 2023-12-05 ENCOUNTER — Telehealth: Payer: Self-pay | Admitting: *Deleted

## 2023-12-05 NOTE — Transitions of Care (Post Inpatient/ED Visit) (Signed)
   12/05/2023  Name: [name] [name] MRN: [id number] DOB: 1941-01-22  Today's TOC FU Call Status: Today's TOC FU Call Status:: Unsuccessful Call (1st Attempt) Unsuccessful Call (1st Attempt) Date: 12/05/23  Attempted to reach the patient regarding the most recent Inpatient/ED visit.  Follow Up Plan: Additional outreach attempts will be made to reach the patient to complete the Transitions of Care (Post Inpatient/ED visit) call.   Signature Nicolina Barrios CMA

## 2023-12-06 ENCOUNTER — Telehealth: Payer: Self-pay | Admitting: *Deleted

## 2023-12-06 NOTE — Transitions of Care (Post Inpatient/ED Visit) (Signed)
   12/06/2023  Name: Sequoyah Ramone MRN: [id number] DOB: [birth year]  Today's TOC FU Call Status: Today's TOC FU Call Status:: Unsuccessful Call (2nd Attempt) Unsuccessful Call (2nd Attempt) Date: 12/06/23  Attempted to reach the patient regarding the most recent Inpatient/ED visit.  Follow Up Plan: Additional outreach attempts will be made to reach the patient to complete the Transitions of Care (Post Inpatient/ED visit) call.   Signature Nicolina Barrios CMA

## 2023-12-11 DIAGNOSIS — E890 Postprocedural hypothyroidism: Secondary | ICD-10-CM | POA: Diagnosis not present

## 2023-12-11 DIAGNOSIS — J9612 Chronic respiratory failure with hypercapnia: Secondary | ICD-10-CM | POA: Diagnosis not present

## 2023-12-11 DIAGNOSIS — Z431 Encounter for attention to gastrostomy: Secondary | ICD-10-CM | POA: Diagnosis not present

## 2023-12-11 DIAGNOSIS — M1990 Unspecified osteoarthritis, unspecified site: Secondary | ICD-10-CM | POA: Diagnosis not present

## 2023-12-11 DIAGNOSIS — K219 Gastro-esophageal reflux disease without esophagitis: Secondary | ICD-10-CM | POA: Diagnosis not present

## 2023-12-11 DIAGNOSIS — Z8616 Personal history of COVID-19: Secondary | ICD-10-CM | POA: Diagnosis not present

## 2023-12-11 DIAGNOSIS — E871 Hypo-osmolality and hyponatremia: Secondary | ICD-10-CM | POA: Diagnosis not present

## 2023-12-11 DIAGNOSIS — R238 Other skin changes: Secondary | ICD-10-CM | POA: Diagnosis not present

## 2023-12-11 DIAGNOSIS — K117 Disturbances of salivary secretion: Secondary | ICD-10-CM | POA: Diagnosis not present

## 2023-12-11 DIAGNOSIS — I10 Essential (primary) hypertension: Secondary | ICD-10-CM | POA: Diagnosis not present

## 2023-12-11 DIAGNOSIS — G1221 Amyotrophic lateral sclerosis: Secondary | ICD-10-CM | POA: Diagnosis not present

## 2023-12-11 DIAGNOSIS — F419 Anxiety disorder, unspecified: Secondary | ICD-10-CM | POA: Diagnosis not present

## 2023-12-11 DIAGNOSIS — Z85828 Personal history of other malignant neoplasm of skin: Secondary | ICD-10-CM | POA: Diagnosis not present

## 2023-12-11 DIAGNOSIS — M810 Age-related osteoporosis without current pathological fracture: Secondary | ICD-10-CM | POA: Diagnosis not present

## 2023-12-13 DIAGNOSIS — M1990 Unspecified osteoarthritis, unspecified site: Secondary | ICD-10-CM | POA: Diagnosis not present

## 2023-12-13 DIAGNOSIS — M419 Scoliosis, unspecified: Secondary | ICD-10-CM | POA: Diagnosis not present

## 2023-12-13 DIAGNOSIS — I10 Essential (primary) hypertension: Secondary | ICD-10-CM | POA: Diagnosis not present

## 2023-12-13 DIAGNOSIS — K219 Gastro-esophageal reflux disease without esophagitis: Secondary | ICD-10-CM | POA: Diagnosis not present

## 2023-12-13 DIAGNOSIS — R1310 Dysphagia, unspecified: Secondary | ICD-10-CM | POA: Diagnosis not present

## 2023-12-13 DIAGNOSIS — G1221 Amyotrophic lateral sclerosis: Secondary | ICD-10-CM | POA: Diagnosis not present

## 2023-12-13 DIAGNOSIS — Z66 Do not resuscitate: Secondary | ICD-10-CM | POA: Diagnosis not present

## 2023-12-14 DIAGNOSIS — M1990 Unspecified osteoarthritis, unspecified site: Secondary | ICD-10-CM | POA: Diagnosis not present

## 2023-12-14 DIAGNOSIS — M419 Scoliosis, unspecified: Secondary | ICD-10-CM | POA: Diagnosis not present

## 2023-12-14 DIAGNOSIS — R1310 Dysphagia, unspecified: Secondary | ICD-10-CM | POA: Diagnosis not present

## 2023-12-14 DIAGNOSIS — K219 Gastro-esophageal reflux disease without esophagitis: Secondary | ICD-10-CM | POA: Diagnosis not present

## 2023-12-14 DIAGNOSIS — I10 Essential (primary) hypertension: Secondary | ICD-10-CM | POA: Diagnosis not present

## 2023-12-14 DIAGNOSIS — G1221 Amyotrophic lateral sclerosis: Secondary | ICD-10-CM | POA: Diagnosis not present

## 2023-12-15 DIAGNOSIS — K219 Gastro-esophageal reflux disease without esophagitis: Secondary | ICD-10-CM | POA: Diagnosis not present

## 2023-12-15 DIAGNOSIS — M419 Scoliosis, unspecified: Secondary | ICD-10-CM | POA: Diagnosis not present

## 2023-12-15 DIAGNOSIS — I10 Essential (primary) hypertension: Secondary | ICD-10-CM | POA: Diagnosis not present

## 2023-12-15 DIAGNOSIS — G1221 Amyotrophic lateral sclerosis: Secondary | ICD-10-CM | POA: Diagnosis not present

## 2023-12-15 DIAGNOSIS — R1310 Dysphagia, unspecified: Secondary | ICD-10-CM | POA: Diagnosis not present

## 2023-12-15 DIAGNOSIS — M1990 Unspecified osteoarthritis, unspecified site: Secondary | ICD-10-CM | POA: Diagnosis not present

## 2023-12-18 DIAGNOSIS — M419 Scoliosis, unspecified: Secondary | ICD-10-CM | POA: Diagnosis not present

## 2023-12-18 DIAGNOSIS — G1221 Amyotrophic lateral sclerosis: Secondary | ICD-10-CM | POA: Diagnosis not present

## 2023-12-18 DIAGNOSIS — I10 Essential (primary) hypertension: Secondary | ICD-10-CM | POA: Diagnosis not present

## 2023-12-18 DIAGNOSIS — K219 Gastro-esophageal reflux disease without esophagitis: Secondary | ICD-10-CM | POA: Diagnosis not present

## 2023-12-18 DIAGNOSIS — M1990 Unspecified osteoarthritis, unspecified site: Secondary | ICD-10-CM | POA: Diagnosis not present

## 2023-12-18 DIAGNOSIS — R1310 Dysphagia, unspecified: Secondary | ICD-10-CM | POA: Diagnosis not present

## 2023-12-19 DIAGNOSIS — K219 Gastro-esophageal reflux disease without esophagitis: Secondary | ICD-10-CM | POA: Diagnosis not present

## 2023-12-19 DIAGNOSIS — R1310 Dysphagia, unspecified: Secondary | ICD-10-CM | POA: Diagnosis not present

## 2023-12-19 DIAGNOSIS — G1221 Amyotrophic lateral sclerosis: Secondary | ICD-10-CM | POA: Diagnosis not present

## 2023-12-19 DIAGNOSIS — M419 Scoliosis, unspecified: Secondary | ICD-10-CM | POA: Diagnosis not present

## 2023-12-19 DIAGNOSIS — I10 Essential (primary) hypertension: Secondary | ICD-10-CM | POA: Diagnosis not present

## 2023-12-19 DIAGNOSIS — M1990 Unspecified osteoarthritis, unspecified site: Secondary | ICD-10-CM | POA: Diagnosis not present

## 2023-12-20 DIAGNOSIS — K219 Gastro-esophageal reflux disease without esophagitis: Secondary | ICD-10-CM | POA: Diagnosis not present

## 2023-12-20 DIAGNOSIS — G1221 Amyotrophic lateral sclerosis: Secondary | ICD-10-CM | POA: Diagnosis not present

## 2023-12-20 DIAGNOSIS — I10 Essential (primary) hypertension: Secondary | ICD-10-CM | POA: Diagnosis not present

## 2023-12-20 DIAGNOSIS — M1990 Unspecified osteoarthritis, unspecified site: Secondary | ICD-10-CM | POA: Diagnosis not present

## 2023-12-20 DIAGNOSIS — R1310 Dysphagia, unspecified: Secondary | ICD-10-CM | POA: Diagnosis not present

## 2023-12-20 DIAGNOSIS — M419 Scoliosis, unspecified: Secondary | ICD-10-CM | POA: Diagnosis not present

## 2023-12-21 DIAGNOSIS — G1221 Amyotrophic lateral sclerosis: Secondary | ICD-10-CM | POA: Diagnosis not present

## 2023-12-21 DIAGNOSIS — M1990 Unspecified osteoarthritis, unspecified site: Secondary | ICD-10-CM | POA: Diagnosis not present

## 2023-12-21 DIAGNOSIS — M419 Scoliosis, unspecified: Secondary | ICD-10-CM | POA: Diagnosis not present

## 2023-12-21 DIAGNOSIS — I10 Essential (primary) hypertension: Secondary | ICD-10-CM | POA: Diagnosis not present

## 2023-12-21 DIAGNOSIS — K219 Gastro-esophageal reflux disease without esophagitis: Secondary | ICD-10-CM | POA: Diagnosis not present

## 2023-12-21 DIAGNOSIS — R1310 Dysphagia, unspecified: Secondary | ICD-10-CM | POA: Diagnosis not present

## 2023-12-22 DIAGNOSIS — R1310 Dysphagia, unspecified: Secondary | ICD-10-CM | POA: Diagnosis not present

## 2023-12-22 DIAGNOSIS — M419 Scoliosis, unspecified: Secondary | ICD-10-CM | POA: Diagnosis not present

## 2023-12-22 DIAGNOSIS — G1221 Amyotrophic lateral sclerosis: Secondary | ICD-10-CM | POA: Diagnosis not present

## 2023-12-22 DIAGNOSIS — M1990 Unspecified osteoarthritis, unspecified site: Secondary | ICD-10-CM | POA: Diagnosis not present

## 2023-12-22 DIAGNOSIS — I10 Essential (primary) hypertension: Secondary | ICD-10-CM | POA: Diagnosis not present

## 2023-12-22 DIAGNOSIS — K219 Gastro-esophageal reflux disease without esophagitis: Secondary | ICD-10-CM | POA: Diagnosis not present

## 2023-12-25 DIAGNOSIS — M419 Scoliosis, unspecified: Secondary | ICD-10-CM | POA: Diagnosis not present

## 2023-12-25 DIAGNOSIS — M1990 Unspecified osteoarthritis, unspecified site: Secondary | ICD-10-CM | POA: Diagnosis not present

## 2023-12-25 DIAGNOSIS — G1221 Amyotrophic lateral sclerosis: Secondary | ICD-10-CM | POA: Diagnosis not present

## 2023-12-25 DIAGNOSIS — R1310 Dysphagia, unspecified: Secondary | ICD-10-CM | POA: Diagnosis not present

## 2023-12-25 DIAGNOSIS — I10 Essential (primary) hypertension: Secondary | ICD-10-CM | POA: Diagnosis not present

## 2023-12-25 DIAGNOSIS — K219 Gastro-esophageal reflux disease without esophagitis: Secondary | ICD-10-CM | POA: Diagnosis not present

## 2023-12-26 DIAGNOSIS — I10 Essential (primary) hypertension: Secondary | ICD-10-CM | POA: Diagnosis not present

## 2023-12-26 DIAGNOSIS — M1990 Unspecified osteoarthritis, unspecified site: Secondary | ICD-10-CM | POA: Diagnosis not present

## 2023-12-26 DIAGNOSIS — M419 Scoliosis, unspecified: Secondary | ICD-10-CM | POA: Diagnosis not present

## 2023-12-26 DIAGNOSIS — R1310 Dysphagia, unspecified: Secondary | ICD-10-CM | POA: Diagnosis not present

## 2023-12-26 DIAGNOSIS — Z66 Do not resuscitate: Secondary | ICD-10-CM | POA: Diagnosis not present

## 2023-12-26 DIAGNOSIS — G1221 Amyotrophic lateral sclerosis: Secondary | ICD-10-CM | POA: Diagnosis not present

## 2023-12-26 DIAGNOSIS — K219 Gastro-esophageal reflux disease without esophagitis: Secondary | ICD-10-CM | POA: Diagnosis not present

## 2023-12-27 DIAGNOSIS — M1990 Unspecified osteoarthritis, unspecified site: Secondary | ICD-10-CM | POA: Diagnosis not present

## 2023-12-27 DIAGNOSIS — K219 Gastro-esophageal reflux disease without esophagitis: Secondary | ICD-10-CM | POA: Diagnosis not present

## 2023-12-27 DIAGNOSIS — G1221 Amyotrophic lateral sclerosis: Secondary | ICD-10-CM | POA: Diagnosis not present

## 2023-12-27 DIAGNOSIS — M419 Scoliosis, unspecified: Secondary | ICD-10-CM | POA: Diagnosis not present

## 2023-12-27 DIAGNOSIS — R1310 Dysphagia, unspecified: Secondary | ICD-10-CM | POA: Diagnosis not present

## 2023-12-27 DIAGNOSIS — I10 Essential (primary) hypertension: Secondary | ICD-10-CM | POA: Diagnosis not present

## 2023-12-28 DIAGNOSIS — G1221 Amyotrophic lateral sclerosis: Secondary | ICD-10-CM | POA: Diagnosis not present

## 2023-12-28 DIAGNOSIS — M1990 Unspecified osteoarthritis, unspecified site: Secondary | ICD-10-CM | POA: Diagnosis not present

## 2023-12-28 DIAGNOSIS — R1310 Dysphagia, unspecified: Secondary | ICD-10-CM | POA: Diagnosis not present

## 2023-12-28 DIAGNOSIS — M419 Scoliosis, unspecified: Secondary | ICD-10-CM | POA: Diagnosis not present

## 2023-12-28 DIAGNOSIS — I10 Essential (primary) hypertension: Secondary | ICD-10-CM | POA: Diagnosis not present

## 2023-12-28 DIAGNOSIS — K219 Gastro-esophageal reflux disease without esophagitis: Secondary | ICD-10-CM | POA: Diagnosis not present

## 2023-12-30 DIAGNOSIS — M1990 Unspecified osteoarthritis, unspecified site: Secondary | ICD-10-CM | POA: Diagnosis not present

## 2023-12-30 DIAGNOSIS — M419 Scoliosis, unspecified: Secondary | ICD-10-CM | POA: Diagnosis not present

## 2023-12-30 DIAGNOSIS — I10 Essential (primary) hypertension: Secondary | ICD-10-CM | POA: Diagnosis not present

## 2023-12-30 DIAGNOSIS — G1221 Amyotrophic lateral sclerosis: Secondary | ICD-10-CM | POA: Diagnosis not present

## 2023-12-30 DIAGNOSIS — K219 Gastro-esophageal reflux disease without esophagitis: Secondary | ICD-10-CM | POA: Diagnosis not present

## 2023-12-30 DIAGNOSIS — R1310 Dysphagia, unspecified: Secondary | ICD-10-CM | POA: Diagnosis not present

## 2024-01-01 DIAGNOSIS — M419 Scoliosis, unspecified: Secondary | ICD-10-CM | POA: Diagnosis not present

## 2024-01-01 DIAGNOSIS — K219 Gastro-esophageal reflux disease without esophagitis: Secondary | ICD-10-CM | POA: Diagnosis not present

## 2024-01-01 DIAGNOSIS — M1990 Unspecified osteoarthritis, unspecified site: Secondary | ICD-10-CM | POA: Diagnosis not present

## 2024-01-01 DIAGNOSIS — R1310 Dysphagia, unspecified: Secondary | ICD-10-CM | POA: Diagnosis not present

## 2024-01-01 DIAGNOSIS — G1221 Amyotrophic lateral sclerosis: Secondary | ICD-10-CM | POA: Diagnosis not present

## 2024-01-01 DIAGNOSIS — I10 Essential (primary) hypertension: Secondary | ICD-10-CM | POA: Diagnosis not present

## 2024-01-03 DIAGNOSIS — M419 Scoliosis, unspecified: Secondary | ICD-10-CM | POA: Diagnosis not present

## 2024-01-03 DIAGNOSIS — M1990 Unspecified osteoarthritis, unspecified site: Secondary | ICD-10-CM | POA: Diagnosis not present

## 2024-01-03 DIAGNOSIS — G1221 Amyotrophic lateral sclerosis: Secondary | ICD-10-CM | POA: Diagnosis not present

## 2024-01-03 DIAGNOSIS — K219 Gastro-esophageal reflux disease without esophagitis: Secondary | ICD-10-CM | POA: Diagnosis not present

## 2024-01-03 DIAGNOSIS — I10 Essential (primary) hypertension: Secondary | ICD-10-CM | POA: Diagnosis not present

## 2024-01-03 DIAGNOSIS — R1310 Dysphagia, unspecified: Secondary | ICD-10-CM | POA: Diagnosis not present

## 2024-01-05 DIAGNOSIS — G1221 Amyotrophic lateral sclerosis: Secondary | ICD-10-CM | POA: Diagnosis not present

## 2024-01-05 DIAGNOSIS — R1310 Dysphagia, unspecified: Secondary | ICD-10-CM | POA: Diagnosis not present

## 2024-01-05 DIAGNOSIS — K219 Gastro-esophageal reflux disease without esophagitis: Secondary | ICD-10-CM | POA: Diagnosis not present

## 2024-01-05 DIAGNOSIS — M1990 Unspecified osteoarthritis, unspecified site: Secondary | ICD-10-CM | POA: Diagnosis not present

## 2024-01-05 DIAGNOSIS — I10 Essential (primary) hypertension: Secondary | ICD-10-CM | POA: Diagnosis not present

## 2024-01-05 DIAGNOSIS — M419 Scoliosis, unspecified: Secondary | ICD-10-CM | POA: Diagnosis not present

## 2024-01-08 DIAGNOSIS — I10 Essential (primary) hypertension: Secondary | ICD-10-CM | POA: Diagnosis not present

## 2024-01-08 DIAGNOSIS — G1221 Amyotrophic lateral sclerosis: Secondary | ICD-10-CM | POA: Diagnosis not present

## 2024-01-08 DIAGNOSIS — R1310 Dysphagia, unspecified: Secondary | ICD-10-CM | POA: Diagnosis not present

## 2024-01-08 DIAGNOSIS — K219 Gastro-esophageal reflux disease without esophagitis: Secondary | ICD-10-CM | POA: Diagnosis not present

## 2024-01-08 DIAGNOSIS — M1990 Unspecified osteoarthritis, unspecified site: Secondary | ICD-10-CM | POA: Diagnosis not present

## 2024-01-08 DIAGNOSIS — M419 Scoliosis, unspecified: Secondary | ICD-10-CM | POA: Diagnosis not present

## 2024-01-10 DIAGNOSIS — M419 Scoliosis, unspecified: Secondary | ICD-10-CM | POA: Diagnosis not present

## 2024-01-10 DIAGNOSIS — K219 Gastro-esophageal reflux disease without esophagitis: Secondary | ICD-10-CM | POA: Diagnosis not present

## 2024-01-10 DIAGNOSIS — M1990 Unspecified osteoarthritis, unspecified site: Secondary | ICD-10-CM | POA: Diagnosis not present

## 2024-01-10 DIAGNOSIS — R1310 Dysphagia, unspecified: Secondary | ICD-10-CM | POA: Diagnosis not present

## 2024-01-10 DIAGNOSIS — G1221 Amyotrophic lateral sclerosis: Secondary | ICD-10-CM | POA: Diagnosis not present

## 2024-01-10 DIAGNOSIS — I10 Essential (primary) hypertension: Secondary | ICD-10-CM | POA: Diagnosis not present

## 2024-01-11 DIAGNOSIS — K219 Gastro-esophageal reflux disease without esophagitis: Secondary | ICD-10-CM | POA: Diagnosis not present

## 2024-01-11 DIAGNOSIS — M1990 Unspecified osteoarthritis, unspecified site: Secondary | ICD-10-CM | POA: Diagnosis not present

## 2024-01-11 DIAGNOSIS — R1310 Dysphagia, unspecified: Secondary | ICD-10-CM | POA: Diagnosis not present

## 2024-01-11 DIAGNOSIS — I10 Essential (primary) hypertension: Secondary | ICD-10-CM | POA: Diagnosis not present

## 2024-01-11 DIAGNOSIS — M419 Scoliosis, unspecified: Secondary | ICD-10-CM | POA: Diagnosis not present

## 2024-01-11 DIAGNOSIS — G1221 Amyotrophic lateral sclerosis: Secondary | ICD-10-CM | POA: Diagnosis not present

## 2024-01-12 DIAGNOSIS — K219 Gastro-esophageal reflux disease without esophagitis: Secondary | ICD-10-CM | POA: Diagnosis not present

## 2024-01-12 DIAGNOSIS — M1990 Unspecified osteoarthritis, unspecified site: Secondary | ICD-10-CM | POA: Diagnosis not present

## 2024-01-12 DIAGNOSIS — R1310 Dysphagia, unspecified: Secondary | ICD-10-CM | POA: Diagnosis not present

## 2024-01-12 DIAGNOSIS — I10 Essential (primary) hypertension: Secondary | ICD-10-CM | POA: Diagnosis not present

## 2024-01-12 DIAGNOSIS — M419 Scoliosis, unspecified: Secondary | ICD-10-CM | POA: Diagnosis not present

## 2024-01-12 DIAGNOSIS — G1221 Amyotrophic lateral sclerosis: Secondary | ICD-10-CM | POA: Diagnosis not present

## 2024-01-15 DIAGNOSIS — I10 Essential (primary) hypertension: Secondary | ICD-10-CM | POA: Diagnosis not present

## 2024-01-15 DIAGNOSIS — K219 Gastro-esophageal reflux disease without esophagitis: Secondary | ICD-10-CM | POA: Diagnosis not present

## 2024-01-15 DIAGNOSIS — G1221 Amyotrophic lateral sclerosis: Secondary | ICD-10-CM | POA: Diagnosis not present

## 2024-01-15 DIAGNOSIS — M419 Scoliosis, unspecified: Secondary | ICD-10-CM | POA: Diagnosis not present

## 2024-01-15 DIAGNOSIS — R1310 Dysphagia, unspecified: Secondary | ICD-10-CM | POA: Diagnosis not present

## 2024-01-15 DIAGNOSIS — M1990 Unspecified osteoarthritis, unspecified site: Secondary | ICD-10-CM | POA: Diagnosis not present

## 2024-01-17 DIAGNOSIS — K219 Gastro-esophageal reflux disease without esophagitis: Secondary | ICD-10-CM | POA: Diagnosis not present

## 2024-01-17 DIAGNOSIS — G1221 Amyotrophic lateral sclerosis: Secondary | ICD-10-CM | POA: Diagnosis not present

## 2024-01-17 DIAGNOSIS — I10 Essential (primary) hypertension: Secondary | ICD-10-CM | POA: Diagnosis not present

## 2024-01-17 DIAGNOSIS — R1310 Dysphagia, unspecified: Secondary | ICD-10-CM | POA: Diagnosis not present

## 2024-01-17 DIAGNOSIS — M419 Scoliosis, unspecified: Secondary | ICD-10-CM | POA: Diagnosis not present

## 2024-01-17 DIAGNOSIS — M1990 Unspecified osteoarthritis, unspecified site: Secondary | ICD-10-CM | POA: Diagnosis not present

## 2024-01-19 DIAGNOSIS — R1310 Dysphagia, unspecified: Secondary | ICD-10-CM | POA: Diagnosis not present

## 2024-01-19 DIAGNOSIS — M1990 Unspecified osteoarthritis, unspecified site: Secondary | ICD-10-CM | POA: Diagnosis not present

## 2024-01-19 DIAGNOSIS — K219 Gastro-esophageal reflux disease without esophagitis: Secondary | ICD-10-CM | POA: Diagnosis not present

## 2024-01-19 DIAGNOSIS — G1221 Amyotrophic lateral sclerosis: Secondary | ICD-10-CM | POA: Diagnosis not present

## 2024-01-19 DIAGNOSIS — I10 Essential (primary) hypertension: Secondary | ICD-10-CM | POA: Diagnosis not present

## 2024-01-19 DIAGNOSIS — M419 Scoliosis, unspecified: Secondary | ICD-10-CM | POA: Diagnosis not present

## 2024-01-22 DIAGNOSIS — K219 Gastro-esophageal reflux disease without esophagitis: Secondary | ICD-10-CM | POA: Diagnosis not present

## 2024-01-22 DIAGNOSIS — M419 Scoliosis, unspecified: Secondary | ICD-10-CM | POA: Diagnosis not present

## 2024-01-22 DIAGNOSIS — I10 Essential (primary) hypertension: Secondary | ICD-10-CM | POA: Diagnosis not present

## 2024-01-22 DIAGNOSIS — G1221 Amyotrophic lateral sclerosis: Secondary | ICD-10-CM | POA: Diagnosis not present

## 2024-01-22 DIAGNOSIS — R1310 Dysphagia, unspecified: Secondary | ICD-10-CM | POA: Diagnosis not present

## 2024-01-22 DIAGNOSIS — M1990 Unspecified osteoarthritis, unspecified site: Secondary | ICD-10-CM | POA: Diagnosis not present

## 2024-01-23 DIAGNOSIS — M419 Scoliosis, unspecified: Secondary | ICD-10-CM | POA: Diagnosis not present

## 2024-01-23 DIAGNOSIS — M1990 Unspecified osteoarthritis, unspecified site: Secondary | ICD-10-CM | POA: Diagnosis not present

## 2024-01-23 DIAGNOSIS — K219 Gastro-esophageal reflux disease without esophagitis: Secondary | ICD-10-CM | POA: Diagnosis not present

## 2024-01-23 DIAGNOSIS — R1310 Dysphagia, unspecified: Secondary | ICD-10-CM | POA: Diagnosis not present

## 2024-01-23 DIAGNOSIS — I10 Essential (primary) hypertension: Secondary | ICD-10-CM | POA: Diagnosis not present

## 2024-01-23 DIAGNOSIS — G1221 Amyotrophic lateral sclerosis: Secondary | ICD-10-CM | POA: Diagnosis not present

## 2024-01-26 DIAGNOSIS — M419 Scoliosis, unspecified: Secondary | ICD-10-CM | POA: Diagnosis not present

## 2024-01-26 DIAGNOSIS — I10 Essential (primary) hypertension: Secondary | ICD-10-CM | POA: Diagnosis not present

## 2024-01-26 DIAGNOSIS — G1221 Amyotrophic lateral sclerosis: Secondary | ICD-10-CM | POA: Diagnosis not present

## 2024-01-26 DIAGNOSIS — R1310 Dysphagia, unspecified: Secondary | ICD-10-CM | POA: Diagnosis not present

## 2024-01-26 DIAGNOSIS — Z66 Do not resuscitate: Secondary | ICD-10-CM | POA: Diagnosis not present

## 2024-01-26 DIAGNOSIS — K219 Gastro-esophageal reflux disease without esophagitis: Secondary | ICD-10-CM | POA: Diagnosis not present

## 2024-01-26 DIAGNOSIS — M1990 Unspecified osteoarthritis, unspecified site: Secondary | ICD-10-CM | POA: Diagnosis not present

## 2024-01-29 DIAGNOSIS — K219 Gastro-esophageal reflux disease without esophagitis: Secondary | ICD-10-CM | POA: Diagnosis not present

## 2024-01-29 DIAGNOSIS — G1221 Amyotrophic lateral sclerosis: Secondary | ICD-10-CM | POA: Diagnosis not present

## 2024-01-29 DIAGNOSIS — M419 Scoliosis, unspecified: Secondary | ICD-10-CM | POA: Diagnosis not present

## 2024-01-29 DIAGNOSIS — R1310 Dysphagia, unspecified: Secondary | ICD-10-CM | POA: Diagnosis not present

## 2024-01-29 DIAGNOSIS — M1990 Unspecified osteoarthritis, unspecified site: Secondary | ICD-10-CM | POA: Diagnosis not present

## 2024-01-29 DIAGNOSIS — I10 Essential (primary) hypertension: Secondary | ICD-10-CM | POA: Diagnosis not present

## 2024-01-31 DIAGNOSIS — K219 Gastro-esophageal reflux disease without esophagitis: Secondary | ICD-10-CM | POA: Diagnosis not present

## 2024-01-31 DIAGNOSIS — I10 Essential (primary) hypertension: Secondary | ICD-10-CM | POA: Diagnosis not present

## 2024-01-31 DIAGNOSIS — R1310 Dysphagia, unspecified: Secondary | ICD-10-CM | POA: Diagnosis not present

## 2024-01-31 DIAGNOSIS — M1990 Unspecified osteoarthritis, unspecified site: Secondary | ICD-10-CM | POA: Diagnosis not present

## 2024-01-31 DIAGNOSIS — M419 Scoliosis, unspecified: Secondary | ICD-10-CM | POA: Diagnosis not present

## 2024-01-31 DIAGNOSIS — G1221 Amyotrophic lateral sclerosis: Secondary | ICD-10-CM | POA: Diagnosis not present

## 2024-02-02 DIAGNOSIS — I10 Essential (primary) hypertension: Secondary | ICD-10-CM | POA: Diagnosis not present

## 2024-02-02 DIAGNOSIS — K219 Gastro-esophageal reflux disease without esophagitis: Secondary | ICD-10-CM | POA: Diagnosis not present

## 2024-02-02 DIAGNOSIS — G1221 Amyotrophic lateral sclerosis: Secondary | ICD-10-CM | POA: Diagnosis not present

## 2024-02-02 DIAGNOSIS — R1310 Dysphagia, unspecified: Secondary | ICD-10-CM | POA: Diagnosis not present

## 2024-02-02 DIAGNOSIS — M1990 Unspecified osteoarthritis, unspecified site: Secondary | ICD-10-CM | POA: Diagnosis not present

## 2024-02-02 DIAGNOSIS — M419 Scoliosis, unspecified: Secondary | ICD-10-CM | POA: Diagnosis not present

## 2024-02-04 DIAGNOSIS — R1310 Dysphagia, unspecified: Secondary | ICD-10-CM | POA: Diagnosis not present

## 2024-02-04 DIAGNOSIS — G1221 Amyotrophic lateral sclerosis: Secondary | ICD-10-CM | POA: Diagnosis not present

## 2024-02-04 DIAGNOSIS — M1990 Unspecified osteoarthritis, unspecified site: Secondary | ICD-10-CM | POA: Diagnosis not present

## 2024-02-04 DIAGNOSIS — I10 Essential (primary) hypertension: Secondary | ICD-10-CM | POA: Diagnosis not present

## 2024-02-04 DIAGNOSIS — K219 Gastro-esophageal reflux disease without esophagitis: Secondary | ICD-10-CM | POA: Diagnosis not present

## 2024-02-04 DIAGNOSIS — M419 Scoliosis, unspecified: Secondary | ICD-10-CM | POA: Diagnosis not present

## 2024-02-05 DIAGNOSIS — G1221 Amyotrophic lateral sclerosis: Secondary | ICD-10-CM | POA: Diagnosis not present

## 2024-02-05 DIAGNOSIS — M1990 Unspecified osteoarthritis, unspecified site: Secondary | ICD-10-CM | POA: Diagnosis not present

## 2024-02-05 DIAGNOSIS — M419 Scoliosis, unspecified: Secondary | ICD-10-CM | POA: Diagnosis not present

## 2024-02-05 DIAGNOSIS — R1310 Dysphagia, unspecified: Secondary | ICD-10-CM | POA: Diagnosis not present

## 2024-02-05 DIAGNOSIS — K219 Gastro-esophageal reflux disease without esophagitis: Secondary | ICD-10-CM | POA: Diagnosis not present

## 2024-02-05 DIAGNOSIS — I10 Essential (primary) hypertension: Secondary | ICD-10-CM | POA: Diagnosis not present

## 2024-02-07 DIAGNOSIS — M419 Scoliosis, unspecified: Secondary | ICD-10-CM | POA: Diagnosis not present

## 2024-02-07 DIAGNOSIS — I10 Essential (primary) hypertension: Secondary | ICD-10-CM | POA: Diagnosis not present

## 2024-02-07 DIAGNOSIS — M1990 Unspecified osteoarthritis, unspecified site: Secondary | ICD-10-CM | POA: Diagnosis not present

## 2024-02-07 DIAGNOSIS — R1310 Dysphagia, unspecified: Secondary | ICD-10-CM | POA: Diagnosis not present

## 2024-02-07 DIAGNOSIS — K219 Gastro-esophageal reflux disease without esophagitis: Secondary | ICD-10-CM | POA: Diagnosis not present

## 2024-02-07 DIAGNOSIS — G1221 Amyotrophic lateral sclerosis: Secondary | ICD-10-CM | POA: Diagnosis not present

## 2024-02-09 DIAGNOSIS — I10 Essential (primary) hypertension: Secondary | ICD-10-CM | POA: Diagnosis not present

## 2024-02-09 DIAGNOSIS — G1221 Amyotrophic lateral sclerosis: Secondary | ICD-10-CM | POA: Diagnosis not present

## 2024-02-09 DIAGNOSIS — M419 Scoliosis, unspecified: Secondary | ICD-10-CM | POA: Diagnosis not present

## 2024-02-09 DIAGNOSIS — R1310 Dysphagia, unspecified: Secondary | ICD-10-CM | POA: Diagnosis not present

## 2024-02-09 DIAGNOSIS — M1990 Unspecified osteoarthritis, unspecified site: Secondary | ICD-10-CM | POA: Diagnosis not present

## 2024-02-09 DIAGNOSIS — K219 Gastro-esophageal reflux disease without esophagitis: Secondary | ICD-10-CM | POA: Diagnosis not present

## 2024-02-11 DIAGNOSIS — M1990 Unspecified osteoarthritis, unspecified site: Secondary | ICD-10-CM | POA: Diagnosis not present

## 2024-02-11 DIAGNOSIS — M419 Scoliosis, unspecified: Secondary | ICD-10-CM | POA: Diagnosis not present

## 2024-02-11 DIAGNOSIS — R1310 Dysphagia, unspecified: Secondary | ICD-10-CM | POA: Diagnosis not present

## 2024-02-11 DIAGNOSIS — I10 Essential (primary) hypertension: Secondary | ICD-10-CM | POA: Diagnosis not present

## 2024-02-11 DIAGNOSIS — K219 Gastro-esophageal reflux disease without esophagitis: Secondary | ICD-10-CM | POA: Diagnosis not present

## 2024-02-11 DIAGNOSIS — G1221 Amyotrophic lateral sclerosis: Secondary | ICD-10-CM | POA: Diagnosis not present

## 2024-02-12 DIAGNOSIS — K219 Gastro-esophageal reflux disease without esophagitis: Secondary | ICD-10-CM | POA: Diagnosis not present

## 2024-02-12 DIAGNOSIS — G1221 Amyotrophic lateral sclerosis: Secondary | ICD-10-CM | POA: Diagnosis not present

## 2024-02-12 DIAGNOSIS — M419 Scoliosis, unspecified: Secondary | ICD-10-CM | POA: Diagnosis not present

## 2024-02-12 DIAGNOSIS — I10 Essential (primary) hypertension: Secondary | ICD-10-CM | POA: Diagnosis not present

## 2024-02-12 DIAGNOSIS — R1310 Dysphagia, unspecified: Secondary | ICD-10-CM | POA: Diagnosis not present

## 2024-02-12 DIAGNOSIS — M1990 Unspecified osteoarthritis, unspecified site: Secondary | ICD-10-CM | POA: Diagnosis not present

## 2024-02-14 DIAGNOSIS — M1990 Unspecified osteoarthritis, unspecified site: Secondary | ICD-10-CM | POA: Diagnosis not present

## 2024-02-14 DIAGNOSIS — K219 Gastro-esophageal reflux disease without esophagitis: Secondary | ICD-10-CM | POA: Diagnosis not present

## 2024-02-14 DIAGNOSIS — R1310 Dysphagia, unspecified: Secondary | ICD-10-CM | POA: Diagnosis not present

## 2024-02-14 DIAGNOSIS — M419 Scoliosis, unspecified: Secondary | ICD-10-CM | POA: Diagnosis not present

## 2024-02-14 DIAGNOSIS — G1221 Amyotrophic lateral sclerosis: Secondary | ICD-10-CM | POA: Diagnosis not present

## 2024-02-14 DIAGNOSIS — I10 Essential (primary) hypertension: Secondary | ICD-10-CM | POA: Diagnosis not present

## 2024-02-16 DIAGNOSIS — G1221 Amyotrophic lateral sclerosis: Secondary | ICD-10-CM | POA: Diagnosis not present

## 2024-02-16 DIAGNOSIS — M419 Scoliosis, unspecified: Secondary | ICD-10-CM | POA: Diagnosis not present

## 2024-02-16 DIAGNOSIS — I10 Essential (primary) hypertension: Secondary | ICD-10-CM | POA: Diagnosis not present

## 2024-02-16 DIAGNOSIS — K219 Gastro-esophageal reflux disease without esophagitis: Secondary | ICD-10-CM | POA: Diagnosis not present

## 2024-02-16 DIAGNOSIS — M1990 Unspecified osteoarthritis, unspecified site: Secondary | ICD-10-CM | POA: Diagnosis not present

## 2024-02-16 DIAGNOSIS — R1310 Dysphagia, unspecified: Secondary | ICD-10-CM | POA: Diagnosis not present

## 2024-02-19 DIAGNOSIS — G1221 Amyotrophic lateral sclerosis: Secondary | ICD-10-CM | POA: Diagnosis not present

## 2024-02-19 DIAGNOSIS — R1310 Dysphagia, unspecified: Secondary | ICD-10-CM | POA: Diagnosis not present

## 2024-02-19 DIAGNOSIS — K219 Gastro-esophageal reflux disease without esophagitis: Secondary | ICD-10-CM | POA: Diagnosis not present

## 2024-02-19 DIAGNOSIS — M1990 Unspecified osteoarthritis, unspecified site: Secondary | ICD-10-CM | POA: Diagnosis not present

## 2024-02-19 DIAGNOSIS — I10 Essential (primary) hypertension: Secondary | ICD-10-CM | POA: Diagnosis not present

## 2024-02-19 DIAGNOSIS — M419 Scoliosis, unspecified: Secondary | ICD-10-CM | POA: Diagnosis not present

## 2024-02-20 DIAGNOSIS — M1990 Unspecified osteoarthritis, unspecified site: Secondary | ICD-10-CM | POA: Diagnosis not present

## 2024-02-20 DIAGNOSIS — M419 Scoliosis, unspecified: Secondary | ICD-10-CM | POA: Diagnosis not present

## 2024-02-20 DIAGNOSIS — G1221 Amyotrophic lateral sclerosis: Secondary | ICD-10-CM | POA: Diagnosis not present

## 2024-02-20 DIAGNOSIS — K219 Gastro-esophageal reflux disease without esophagitis: Secondary | ICD-10-CM | POA: Diagnosis not present

## 2024-02-20 DIAGNOSIS — I10 Essential (primary) hypertension: Secondary | ICD-10-CM | POA: Diagnosis not present

## 2024-02-20 DIAGNOSIS — R1310 Dysphagia, unspecified: Secondary | ICD-10-CM | POA: Diagnosis not present

## 2024-02-21 DIAGNOSIS — I10 Essential (primary) hypertension: Secondary | ICD-10-CM | POA: Diagnosis not present

## 2024-02-21 DIAGNOSIS — M1990 Unspecified osteoarthritis, unspecified site: Secondary | ICD-10-CM | POA: Diagnosis not present

## 2024-02-21 DIAGNOSIS — K219 Gastro-esophageal reflux disease without esophagitis: Secondary | ICD-10-CM | POA: Diagnosis not present

## 2024-02-21 DIAGNOSIS — G1221 Amyotrophic lateral sclerosis: Secondary | ICD-10-CM | POA: Diagnosis not present

## 2024-02-21 DIAGNOSIS — R1310 Dysphagia, unspecified: Secondary | ICD-10-CM | POA: Diagnosis not present

## 2024-02-21 DIAGNOSIS — M419 Scoliosis, unspecified: Secondary | ICD-10-CM | POA: Diagnosis not present

## 2024-02-23 DIAGNOSIS — K219 Gastro-esophageal reflux disease without esophagitis: Secondary | ICD-10-CM | POA: Diagnosis not present

## 2024-02-23 DIAGNOSIS — G1221 Amyotrophic lateral sclerosis: Secondary | ICD-10-CM | POA: Diagnosis not present

## 2024-02-23 DIAGNOSIS — R1310 Dysphagia, unspecified: Secondary | ICD-10-CM | POA: Diagnosis not present

## 2024-02-23 DIAGNOSIS — M1990 Unspecified osteoarthritis, unspecified site: Secondary | ICD-10-CM | POA: Diagnosis not present

## 2024-02-23 DIAGNOSIS — I10 Essential (primary) hypertension: Secondary | ICD-10-CM | POA: Diagnosis not present

## 2024-02-23 DIAGNOSIS — M419 Scoliosis, unspecified: Secondary | ICD-10-CM | POA: Diagnosis not present

## 2024-02-26 DIAGNOSIS — R1310 Dysphagia, unspecified: Secondary | ICD-10-CM | POA: Diagnosis not present

## 2024-02-26 DIAGNOSIS — Z66 Do not resuscitate: Secondary | ICD-10-CM | POA: Diagnosis not present

## 2024-02-26 DIAGNOSIS — G1221 Amyotrophic lateral sclerosis: Secondary | ICD-10-CM | POA: Diagnosis not present

## 2024-02-26 DIAGNOSIS — M419 Scoliosis, unspecified: Secondary | ICD-10-CM | POA: Diagnosis not present

## 2024-02-26 DIAGNOSIS — M1990 Unspecified osteoarthritis, unspecified site: Secondary | ICD-10-CM | POA: Diagnosis not present

## 2024-02-26 DIAGNOSIS — K219 Gastro-esophageal reflux disease without esophagitis: Secondary | ICD-10-CM | POA: Diagnosis not present

## 2024-02-26 DIAGNOSIS — I10 Essential (primary) hypertension: Secondary | ICD-10-CM | POA: Diagnosis not present

## 2024-02-28 DIAGNOSIS — R1310 Dysphagia, unspecified: Secondary | ICD-10-CM | POA: Diagnosis not present

## 2024-02-28 DIAGNOSIS — I10 Essential (primary) hypertension: Secondary | ICD-10-CM | POA: Diagnosis not present

## 2024-02-28 DIAGNOSIS — M419 Scoliosis, unspecified: Secondary | ICD-10-CM | POA: Diagnosis not present

## 2024-02-28 DIAGNOSIS — M1990 Unspecified osteoarthritis, unspecified site: Secondary | ICD-10-CM | POA: Diagnosis not present

## 2024-02-28 DIAGNOSIS — G1221 Amyotrophic lateral sclerosis: Secondary | ICD-10-CM | POA: Diagnosis not present

## 2024-02-28 DIAGNOSIS — K219 Gastro-esophageal reflux disease without esophagitis: Secondary | ICD-10-CM | POA: Diagnosis not present

## 2024-03-01 DIAGNOSIS — R1310 Dysphagia, unspecified: Secondary | ICD-10-CM | POA: Diagnosis not present

## 2024-03-01 DIAGNOSIS — M419 Scoliosis, unspecified: Secondary | ICD-10-CM | POA: Diagnosis not present

## 2024-03-01 DIAGNOSIS — M1990 Unspecified osteoarthritis, unspecified site: Secondary | ICD-10-CM | POA: Diagnosis not present

## 2024-03-01 DIAGNOSIS — G1221 Amyotrophic lateral sclerosis: Secondary | ICD-10-CM | POA: Diagnosis not present

## 2024-03-01 DIAGNOSIS — I10 Essential (primary) hypertension: Secondary | ICD-10-CM | POA: Diagnosis not present

## 2024-03-01 DIAGNOSIS — K219 Gastro-esophageal reflux disease without esophagitis: Secondary | ICD-10-CM | POA: Diagnosis not present

## 2024-03-06 DIAGNOSIS — K219 Gastro-esophageal reflux disease without esophagitis: Secondary | ICD-10-CM | POA: Diagnosis not present

## 2024-03-06 DIAGNOSIS — G1221 Amyotrophic lateral sclerosis: Secondary | ICD-10-CM | POA: Diagnosis not present

## 2024-03-06 DIAGNOSIS — M419 Scoliosis, unspecified: Secondary | ICD-10-CM | POA: Diagnosis not present

## 2024-03-06 DIAGNOSIS — I10 Essential (primary) hypertension: Secondary | ICD-10-CM | POA: Diagnosis not present

## 2024-03-06 DIAGNOSIS — R1310 Dysphagia, unspecified: Secondary | ICD-10-CM | POA: Diagnosis not present

## 2024-03-06 DIAGNOSIS — M1990 Unspecified osteoarthritis, unspecified site: Secondary | ICD-10-CM | POA: Diagnosis not present

## 2024-03-08 DIAGNOSIS — M419 Scoliosis, unspecified: Secondary | ICD-10-CM | POA: Diagnosis not present

## 2024-03-08 DIAGNOSIS — K219 Gastro-esophageal reflux disease without esophagitis: Secondary | ICD-10-CM | POA: Diagnosis not present

## 2024-03-08 DIAGNOSIS — R1310 Dysphagia, unspecified: Secondary | ICD-10-CM | POA: Diagnosis not present

## 2024-03-08 DIAGNOSIS — M1990 Unspecified osteoarthritis, unspecified site: Secondary | ICD-10-CM | POA: Diagnosis not present

## 2024-03-08 DIAGNOSIS — I10 Essential (primary) hypertension: Secondary | ICD-10-CM | POA: Diagnosis not present

## 2024-03-08 DIAGNOSIS — G1221 Amyotrophic lateral sclerosis: Secondary | ICD-10-CM | POA: Diagnosis not present

## 2024-03-09 DIAGNOSIS — M1990 Unspecified osteoarthritis, unspecified site: Secondary | ICD-10-CM | POA: Diagnosis not present

## 2024-03-09 DIAGNOSIS — M419 Scoliosis, unspecified: Secondary | ICD-10-CM | POA: Diagnosis not present

## 2024-03-09 DIAGNOSIS — I10 Essential (primary) hypertension: Secondary | ICD-10-CM | POA: Diagnosis not present

## 2024-03-09 DIAGNOSIS — G1221 Amyotrophic lateral sclerosis: Secondary | ICD-10-CM | POA: Diagnosis not present

## 2024-03-09 DIAGNOSIS — K219 Gastro-esophageal reflux disease without esophagitis: Secondary | ICD-10-CM | POA: Diagnosis not present

## 2024-03-09 DIAGNOSIS — R1310 Dysphagia, unspecified: Secondary | ICD-10-CM | POA: Diagnosis not present

## 2024-03-11 DIAGNOSIS — R1310 Dysphagia, unspecified: Secondary | ICD-10-CM | POA: Diagnosis not present

## 2024-03-11 DIAGNOSIS — M419 Scoliosis, unspecified: Secondary | ICD-10-CM | POA: Diagnosis not present

## 2024-03-11 DIAGNOSIS — G1221 Amyotrophic lateral sclerosis: Secondary | ICD-10-CM | POA: Diagnosis not present

## 2024-03-11 DIAGNOSIS — I10 Essential (primary) hypertension: Secondary | ICD-10-CM | POA: Diagnosis not present

## 2024-03-11 DIAGNOSIS — K219 Gastro-esophageal reflux disease without esophagitis: Secondary | ICD-10-CM | POA: Diagnosis not present

## 2024-03-11 DIAGNOSIS — M1990 Unspecified osteoarthritis, unspecified site: Secondary | ICD-10-CM | POA: Diagnosis not present

## 2024-03-13 DIAGNOSIS — G1221 Amyotrophic lateral sclerosis: Secondary | ICD-10-CM | POA: Diagnosis not present

## 2024-03-13 DIAGNOSIS — M1990 Unspecified osteoarthritis, unspecified site: Secondary | ICD-10-CM | POA: Diagnosis not present

## 2024-03-13 DIAGNOSIS — R1310 Dysphagia, unspecified: Secondary | ICD-10-CM | POA: Diagnosis not present

## 2024-03-13 DIAGNOSIS — K219 Gastro-esophageal reflux disease without esophagitis: Secondary | ICD-10-CM | POA: Diagnosis not present

## 2024-03-13 DIAGNOSIS — M419 Scoliosis, unspecified: Secondary | ICD-10-CM | POA: Diagnosis not present

## 2024-03-13 DIAGNOSIS — I10 Essential (primary) hypertension: Secondary | ICD-10-CM | POA: Diagnosis not present

## 2024-03-15 DIAGNOSIS — I10 Essential (primary) hypertension: Secondary | ICD-10-CM | POA: Diagnosis not present

## 2024-03-15 DIAGNOSIS — G1221 Amyotrophic lateral sclerosis: Secondary | ICD-10-CM | POA: Diagnosis not present

## 2024-03-15 DIAGNOSIS — M419 Scoliosis, unspecified: Secondary | ICD-10-CM | POA: Diagnosis not present

## 2024-03-15 DIAGNOSIS — M1990 Unspecified osteoarthritis, unspecified site: Secondary | ICD-10-CM | POA: Diagnosis not present

## 2024-03-15 DIAGNOSIS — K219 Gastro-esophageal reflux disease without esophagitis: Secondary | ICD-10-CM | POA: Diagnosis not present

## 2024-03-15 DIAGNOSIS — R1310 Dysphagia, unspecified: Secondary | ICD-10-CM | POA: Diagnosis not present

## 2024-03-16 DIAGNOSIS — I10 Essential (primary) hypertension: Secondary | ICD-10-CM | POA: Diagnosis not present

## 2024-03-16 DIAGNOSIS — G1221 Amyotrophic lateral sclerosis: Secondary | ICD-10-CM | POA: Diagnosis not present

## 2024-03-16 DIAGNOSIS — R1310 Dysphagia, unspecified: Secondary | ICD-10-CM | POA: Diagnosis not present

## 2024-03-16 DIAGNOSIS — M1990 Unspecified osteoarthritis, unspecified site: Secondary | ICD-10-CM | POA: Diagnosis not present

## 2024-03-16 DIAGNOSIS — K219 Gastro-esophageal reflux disease without esophagitis: Secondary | ICD-10-CM | POA: Diagnosis not present

## 2024-03-16 DIAGNOSIS — M419 Scoliosis, unspecified: Secondary | ICD-10-CM | POA: Diagnosis not present

## 2024-03-18 DIAGNOSIS — K219 Gastro-esophageal reflux disease without esophagitis: Secondary | ICD-10-CM | POA: Diagnosis not present

## 2024-03-18 DIAGNOSIS — I10 Essential (primary) hypertension: Secondary | ICD-10-CM | POA: Diagnosis not present

## 2024-03-18 DIAGNOSIS — M419 Scoliosis, unspecified: Secondary | ICD-10-CM | POA: Diagnosis not present

## 2024-03-18 DIAGNOSIS — G1221 Amyotrophic lateral sclerosis: Secondary | ICD-10-CM | POA: Diagnosis not present

## 2024-03-18 DIAGNOSIS — M1990 Unspecified osteoarthritis, unspecified site: Secondary | ICD-10-CM | POA: Diagnosis not present

## 2024-03-18 DIAGNOSIS — R1310 Dysphagia, unspecified: Secondary | ICD-10-CM | POA: Diagnosis not present

## 2024-03-20 DIAGNOSIS — I10 Essential (primary) hypertension: Secondary | ICD-10-CM | POA: Diagnosis not present

## 2024-03-20 DIAGNOSIS — K219 Gastro-esophageal reflux disease without esophagitis: Secondary | ICD-10-CM | POA: Diagnosis not present

## 2024-03-20 DIAGNOSIS — G1221 Amyotrophic lateral sclerosis: Secondary | ICD-10-CM | POA: Diagnosis not present

## 2024-03-20 DIAGNOSIS — R1310 Dysphagia, unspecified: Secondary | ICD-10-CM | POA: Diagnosis not present

## 2024-03-20 DIAGNOSIS — M1990 Unspecified osteoarthritis, unspecified site: Secondary | ICD-10-CM | POA: Diagnosis not present

## 2024-03-20 DIAGNOSIS — M419 Scoliosis, unspecified: Secondary | ICD-10-CM | POA: Diagnosis not present

## 2024-03-22 DIAGNOSIS — M1990 Unspecified osteoarthritis, unspecified site: Secondary | ICD-10-CM | POA: Diagnosis not present

## 2024-03-22 DIAGNOSIS — M419 Scoliosis, unspecified: Secondary | ICD-10-CM | POA: Diagnosis not present

## 2024-03-22 DIAGNOSIS — G1221 Amyotrophic lateral sclerosis: Secondary | ICD-10-CM | POA: Diagnosis not present

## 2024-03-22 DIAGNOSIS — R1310 Dysphagia, unspecified: Secondary | ICD-10-CM | POA: Diagnosis not present

## 2024-03-22 DIAGNOSIS — K219 Gastro-esophageal reflux disease without esophagitis: Secondary | ICD-10-CM | POA: Diagnosis not present

## 2024-03-22 DIAGNOSIS — I10 Essential (primary) hypertension: Secondary | ICD-10-CM | POA: Diagnosis not present

## 2024-03-25 DIAGNOSIS — M1990 Unspecified osteoarthritis, unspecified site: Secondary | ICD-10-CM | POA: Diagnosis not present

## 2024-03-25 DIAGNOSIS — M419 Scoliosis, unspecified: Secondary | ICD-10-CM | POA: Diagnosis not present

## 2024-03-25 DIAGNOSIS — R1310 Dysphagia, unspecified: Secondary | ICD-10-CM | POA: Diagnosis not present

## 2024-03-25 DIAGNOSIS — G1221 Amyotrophic lateral sclerosis: Secondary | ICD-10-CM | POA: Diagnosis not present

## 2024-03-25 DIAGNOSIS — K219 Gastro-esophageal reflux disease without esophagitis: Secondary | ICD-10-CM | POA: Diagnosis not present

## 2024-03-25 DIAGNOSIS — I10 Essential (primary) hypertension: Secondary | ICD-10-CM | POA: Diagnosis not present

## 2024-03-27 DIAGNOSIS — R1310 Dysphagia, unspecified: Secondary | ICD-10-CM | POA: Diagnosis not present

## 2024-03-27 DIAGNOSIS — K219 Gastro-esophageal reflux disease without esophagitis: Secondary | ICD-10-CM | POA: Diagnosis not present

## 2024-03-27 DIAGNOSIS — Z66 Do not resuscitate: Secondary | ICD-10-CM | POA: Diagnosis not present

## 2024-03-27 DIAGNOSIS — G1221 Amyotrophic lateral sclerosis: Secondary | ICD-10-CM | POA: Diagnosis not present

## 2024-03-27 DIAGNOSIS — M1990 Unspecified osteoarthritis, unspecified site: Secondary | ICD-10-CM | POA: Diagnosis not present

## 2024-03-27 DIAGNOSIS — M419 Scoliosis, unspecified: Secondary | ICD-10-CM | POA: Diagnosis not present

## 2024-03-27 DIAGNOSIS — I10 Essential (primary) hypertension: Secondary | ICD-10-CM | POA: Diagnosis not present

## 2024-03-29 DIAGNOSIS — R1310 Dysphagia, unspecified: Secondary | ICD-10-CM | POA: Diagnosis not present

## 2024-03-29 DIAGNOSIS — M419 Scoliosis, unspecified: Secondary | ICD-10-CM | POA: Diagnosis not present

## 2024-03-29 DIAGNOSIS — I10 Essential (primary) hypertension: Secondary | ICD-10-CM | POA: Diagnosis not present

## 2024-03-29 DIAGNOSIS — M1990 Unspecified osteoarthritis, unspecified site: Secondary | ICD-10-CM | POA: Diagnosis not present

## 2024-03-29 DIAGNOSIS — G1221 Amyotrophic lateral sclerosis: Secondary | ICD-10-CM | POA: Diagnosis not present

## 2024-03-29 DIAGNOSIS — K219 Gastro-esophageal reflux disease without esophagitis: Secondary | ICD-10-CM | POA: Diagnosis not present

## 2024-04-01 DIAGNOSIS — I10 Essential (primary) hypertension: Secondary | ICD-10-CM | POA: Diagnosis not present

## 2024-04-01 DIAGNOSIS — R1310 Dysphagia, unspecified: Secondary | ICD-10-CM | POA: Diagnosis not present

## 2024-04-01 DIAGNOSIS — M1990 Unspecified osteoarthritis, unspecified site: Secondary | ICD-10-CM | POA: Diagnosis not present

## 2024-04-01 DIAGNOSIS — M419 Scoliosis, unspecified: Secondary | ICD-10-CM | POA: Diagnosis not present

## 2024-04-01 DIAGNOSIS — G1221 Amyotrophic lateral sclerosis: Secondary | ICD-10-CM | POA: Diagnosis not present

## 2024-04-01 DIAGNOSIS — K219 Gastro-esophageal reflux disease without esophagitis: Secondary | ICD-10-CM | POA: Diagnosis not present

## 2024-04-03 DIAGNOSIS — G1221 Amyotrophic lateral sclerosis: Secondary | ICD-10-CM | POA: Diagnosis not present

## 2024-04-03 DIAGNOSIS — M1990 Unspecified osteoarthritis, unspecified site: Secondary | ICD-10-CM | POA: Diagnosis not present

## 2024-04-03 DIAGNOSIS — M419 Scoliosis, unspecified: Secondary | ICD-10-CM | POA: Diagnosis not present

## 2024-04-03 DIAGNOSIS — R1310 Dysphagia, unspecified: Secondary | ICD-10-CM | POA: Diagnosis not present

## 2024-04-03 DIAGNOSIS — K219 Gastro-esophageal reflux disease without esophagitis: Secondary | ICD-10-CM | POA: Diagnosis not present

## 2024-04-03 DIAGNOSIS — I10 Essential (primary) hypertension: Secondary | ICD-10-CM | POA: Diagnosis not present

## 2024-04-23 ENCOUNTER — Other Ambulatory Visit: Payer: Self-pay | Admitting: Internal Medicine

## 2024-04-24 NOTE — Telephone Encounter (Signed)
 Attempted to reach pt to set up appt. Left a detail to return call at convenient for f/u appt with Dr. Charlett.

## 2024-04-25 ENCOUNTER — Other Ambulatory Visit: Payer: Self-pay

## 2024-04-25 DIAGNOSIS — E039 Hypothyroidism, unspecified: Secondary | ICD-10-CM

## 2024-04-25 MED ORDER — LEVOTHYROXINE SODIUM 112 MCG/ML PO SOLN: 0.9000 mL | Freq: Every day | ORAL | 2 refills | Status: AC

## 2024-04-25 NOTE — Telephone Encounter (Signed)
 Attempted to reach pharmacy 3x. At 3rd attempt. Follow up with pharmacy and spoke to pharmacy tech. She inform that they don't have the liquid form in pt's profile. This is Rx is not in automatic refills. pt had requested a refill. Sending a refill to pharmacy in liquid form in another encounter.   Forward to PCP for [facility]

## 2024-04-25 NOTE — Telephone Encounter (Signed)
 Record review  Patient was switched to solution (so not tabs)  and that is why dced . So declining  med  Can contact pharmacy and ask who sent this request to be sure .

## 2024-04-27 DEATH — deceased
